# Patient Record
Sex: Male | Born: 1935 | Race: White | Hispanic: No | Marital: Married | Smoking: Former smoker
Health system: Southern US, Community
[De-identification: ages and names within clinical notes are randomized; demographics above are authoritative.]

## PROBLEM LIST (undated history)

## (undated) DIAGNOSIS — N189 Chronic kidney disease, unspecified: Secondary | ICD-10-CM

## (undated) DIAGNOSIS — Z0000 Encounter for general adult medical examination without abnormal findings: Secondary | ICD-10-CM

## (undated) DIAGNOSIS — D649 Anemia, unspecified: Secondary | ICD-10-CM

## (undated) DIAGNOSIS — I4821 Permanent atrial fibrillation: Secondary | ICD-10-CM

## (undated) DIAGNOSIS — D4620 Refractory anemia with excess of blasts, unspecified: Secondary | ICD-10-CM

## (undated) DIAGNOSIS — K219 Gastro-esophageal reflux disease without esophagitis: Secondary | ICD-10-CM

## (undated) DIAGNOSIS — M1990 Unspecified osteoarthritis, unspecified site: Secondary | ICD-10-CM

## (undated) DIAGNOSIS — I509 Heart failure, unspecified: Secondary | ICD-10-CM

## (undated) DIAGNOSIS — J189 Pneumonia, unspecified organism: Secondary | ICD-10-CM

## (undated) DIAGNOSIS — M719 Bursopathy, unspecified: Secondary | ICD-10-CM

## (undated) DIAGNOSIS — M109 Gout, unspecified: Secondary | ICD-10-CM

## (undated) DIAGNOSIS — H9319 Tinnitus, unspecified ear: Secondary | ICD-10-CM

## (undated) DIAGNOSIS — Z950 Presence of cardiac pacemaker: Secondary | ICD-10-CM

## (undated) DIAGNOSIS — Z8719 Personal history of other diseases of the digestive system: Secondary | ICD-10-CM

## (undated) DIAGNOSIS — I442 Atrioventricular block, complete: Secondary | ICD-10-CM

## (undated) DIAGNOSIS — N200 Calculus of kidney: Secondary | ICD-10-CM

## (undated) DIAGNOSIS — Z9289 Personal history of other medical treatment: Secondary | ICD-10-CM

## (undated) HISTORY — DX: Permanent atrial fibrillation: I48.21

## (undated) HISTORY — PX: KIDNEY STONE SURGERY: SHX686

## (undated) HISTORY — PX: CATARACT EXTRACTION W/ INTRAOCULAR LENS  IMPLANT, BILATERAL: SHX1307

## (undated) HISTORY — PX: BUNIONECTOMY: SHX129

## (undated) HISTORY — DX: Hemochromatosis due to repeated red blood cell transfusions: E83.111

## (undated) HISTORY — DX: Bursopathy, unspecified: M71.9

## (undated) HISTORY — DX: Encounter for general adult medical examination without abnormal findings: Z00.00

## (undated) HISTORY — PX: TOTAL KNEE ARTHROPLASTY: SHX125

## (undated) HISTORY — DX: Atrioventricular block, complete: I44.2

## (undated) HISTORY — PX: PILONIDAL CYST EXCISION: SHX744

## (undated) HISTORY — DX: Refractory anemia with excess of blasts, unspecified: D46.20

## (undated) HISTORY — PX: INGUINAL HERNIA REPAIR: SUR1180

## (undated) HISTORY — DX: Tinnitus, unspecified ear: H93.19

---

## 1983-01-20 DIAGNOSIS — N200 Calculus of kidney: Secondary | ICD-10-CM

## 1983-01-20 HISTORY — PX: LITHOTRIPSY: SUR834

## 1983-01-20 HISTORY — DX: Calculus of kidney: N20.0

## 1998-02-18 ENCOUNTER — Ambulatory Visit (HOSPITAL_COMMUNITY): Admission: RE | Admit: 1998-02-18 | Discharge: 1998-02-18 | Payer: Self-pay | Admitting: Hematology and Oncology

## 2001-07-25 ENCOUNTER — Ambulatory Visit (HOSPITAL_COMMUNITY): Admission: RE | Admit: 2001-07-25 | Discharge: 2001-07-25 | Payer: Self-pay | Admitting: Gastroenterology

## 2001-07-25 ENCOUNTER — Encounter (INDEPENDENT_AMBULATORY_CARE_PROVIDER_SITE_OTHER): Payer: Self-pay | Admitting: *Deleted

## 2002-03-31 ENCOUNTER — Ambulatory Visit: Admission: RE | Admit: 2002-03-31 | Discharge: 2002-03-31 | Payer: Self-pay | Admitting: Urology

## 2002-05-26 ENCOUNTER — Ambulatory Visit (HOSPITAL_COMMUNITY): Admission: RE | Admit: 2002-05-26 | Discharge: 2002-05-26 | Payer: Self-pay | Admitting: Hematology & Oncology

## 2002-05-26 ENCOUNTER — Encounter (INDEPENDENT_AMBULATORY_CARE_PROVIDER_SITE_OTHER): Payer: Self-pay | Admitting: Specialist

## 2002-09-23 ENCOUNTER — Encounter: Payer: Self-pay | Admitting: Emergency Medicine

## 2002-09-23 ENCOUNTER — Inpatient Hospital Stay (HOSPITAL_COMMUNITY): Admission: EM | Admit: 2002-09-23 | Discharge: 2002-09-29 | Payer: Self-pay | Admitting: Emergency Medicine

## 2002-09-26 ENCOUNTER — Encounter (INDEPENDENT_AMBULATORY_CARE_PROVIDER_SITE_OTHER): Payer: Self-pay | Admitting: Cardiology

## 2003-03-21 ENCOUNTER — Inpatient Hospital Stay (HOSPITAL_COMMUNITY): Admission: EM | Admit: 2003-03-21 | Discharge: 2003-03-23 | Payer: Self-pay | Admitting: Emergency Medicine

## 2004-01-15 ENCOUNTER — Ambulatory Visit: Payer: Self-pay | Admitting: Hematology & Oncology

## 2004-03-03 ENCOUNTER — Ambulatory Visit: Payer: Self-pay | Admitting: Hematology & Oncology

## 2004-04-18 ENCOUNTER — Ambulatory Visit: Payer: Self-pay | Admitting: Hematology & Oncology

## 2004-06-06 ENCOUNTER — Ambulatory Visit: Payer: Self-pay | Admitting: Hematology & Oncology

## 2004-07-07 ENCOUNTER — Encounter (HOSPITAL_COMMUNITY): Admission: RE | Admit: 2004-07-07 | Discharge: 2004-10-05 | Payer: Self-pay | Admitting: Hematology & Oncology

## 2004-07-28 ENCOUNTER — Ambulatory Visit: Payer: Self-pay | Admitting: Hematology & Oncology

## 2004-09-19 ENCOUNTER — Ambulatory Visit: Payer: Self-pay | Admitting: Hematology & Oncology

## 2004-10-27 ENCOUNTER — Encounter (HOSPITAL_COMMUNITY): Admission: RE | Admit: 2004-10-27 | Discharge: 2005-01-16 | Payer: Self-pay | Admitting: Hematology & Oncology

## 2004-11-07 ENCOUNTER — Ambulatory Visit: Payer: Self-pay | Admitting: Hematology & Oncology

## 2004-12-23 ENCOUNTER — Ambulatory Visit: Payer: Self-pay | Admitting: Hematology & Oncology

## 2005-02-04 ENCOUNTER — Ambulatory Visit (HOSPITAL_COMMUNITY): Admission: RE | Admit: 2005-02-04 | Discharge: 2005-02-04 | Payer: Self-pay | Admitting: Hematology & Oncology

## 2005-02-20 ENCOUNTER — Ambulatory Visit: Payer: Self-pay | Admitting: Hematology & Oncology

## 2005-03-23 ENCOUNTER — Ambulatory Visit (HOSPITAL_COMMUNITY): Admission: RE | Admit: 2005-03-23 | Discharge: 2005-03-23 | Payer: Self-pay | Admitting: Surgery

## 2005-04-10 ENCOUNTER — Ambulatory Visit: Payer: Self-pay | Admitting: Hematology & Oncology

## 2005-04-27 LAB — CBC & DIFF AND RETIC
BASO%: 0.5 % (ref 0.0–2.0)
Eosinophils Absolute: 0.1 10*3/uL (ref 0.0–0.5)
LYMPH%: 42.7 % (ref 14.0–48.0)
MCHC: 34.8 g/dL (ref 32.0–35.9)
MCV: 103.6 fL — ABNORMAL HIGH (ref 81.6–98.0)
MONO#: 0.6 10*3/uL (ref 0.1–0.9)
MONO%: 12.1 % (ref 0.0–13.0)
NEUT#: 1.9 10*3/uL (ref 1.5–6.5)
Platelets: 147 10*3/uL (ref 145–400)
RBC: 2.94 10*6/uL — ABNORMAL LOW (ref 4.20–5.71)
RDW: 16.6 % — ABNORMAL HIGH (ref 11.2–14.6)
RETIC #: 40.3 10*3/uL (ref 31.8–103.9)
Retic %: 1.4 % (ref 0.7–2.3)
WBC: 4.6 10*3/uL (ref 4.0–10.0)

## 2005-04-27 LAB — PROTIME-INR: INR: 1.6 — ABNORMAL LOW (ref 2.00–3.50)

## 2005-05-11 LAB — CBC WITH DIFFERENTIAL/PLATELET
Basophils Absolute: 0.1 10*3/uL (ref 0.0–0.1)
EOS%: 2.6 % (ref 0.0–7.0)
Eosinophils Absolute: 0.1 10*3/uL (ref 0.0–0.5)
HCT: 30.1 % — ABNORMAL LOW (ref 38.7–49.9)
HGB: 10.9 g/dL — ABNORMAL LOW (ref 13.0–17.1)
MCH: 35.8 pg — ABNORMAL HIGH (ref 28.0–33.4)
MCV: 99.2 fL — ABNORMAL HIGH (ref 81.6–98.0)
NEUT#: 1.5 10*3/uL (ref 1.5–6.5)
NEUT%: 35.8 % — ABNORMAL LOW (ref 40.0–75.0)
lymph#: 2 10*3/uL (ref 0.9–3.3)

## 2005-05-25 ENCOUNTER — Ambulatory Visit: Payer: Self-pay | Admitting: Hematology & Oncology

## 2005-05-25 LAB — CBC WITH DIFFERENTIAL/PLATELET
Basophils Absolute: 0.1 10*3/uL (ref 0.0–0.1)
EOS%: 1.9 % (ref 0.0–7.0)
Eosinophils Absolute: 0.1 10*3/uL (ref 0.0–0.5)
LYMPH%: 37.3 % (ref 14.0–48.0)
MCH: 36.3 pg — ABNORMAL HIGH (ref 28.0–33.4)
MCV: 100 fL — ABNORMAL HIGH (ref 81.6–98.0)
MONO%: 11.3 % (ref 0.0–13.0)
Platelets: 139 10*3/uL — ABNORMAL LOW (ref 145–400)
RBC: 3.03 10*6/uL — ABNORMAL LOW (ref 4.20–5.71)
RDW: 13.4 % (ref 11.2–14.6)

## 2005-05-25 LAB — CHCC SMEAR

## 2005-05-25 LAB — PROTIME-INR
INR: 1.9 — ABNORMAL LOW (ref 2.00–3.50)
Protime: 17.1 Seconds — ABNORMAL HIGH (ref 10.6–13.4)

## 2005-06-08 LAB — CBC WITH DIFFERENTIAL/PLATELET
Basophils Absolute: 0 10*3/uL (ref 0.0–0.1)
Eosinophils Absolute: 0.1 10*3/uL (ref 0.0–0.5)
HCT: 28.4 % — ABNORMAL LOW (ref 38.7–49.9)
LYMPH%: 44.9 % (ref 14.0–48.0)
MCV: 103.5 fL — ABNORMAL HIGH (ref 81.6–98.0)
MONO#: 0.5 10*3/uL (ref 0.1–0.9)
MONO%: 13.8 % — ABNORMAL HIGH (ref 0.0–13.0)
NEUT#: 1.4 10*3/uL — ABNORMAL LOW (ref 1.5–6.5)
NEUT%: 38.2 % — ABNORMAL LOW (ref 40.0–75.0)
Platelets: 163 10*3/uL (ref 145–400)
RBC: 2.75 10*6/uL — ABNORMAL LOW (ref 4.20–5.71)
WBC: 3.6 10*3/uL — ABNORMAL LOW (ref 4.0–10.0)

## 2005-06-08 LAB — COMPREHENSIVE METABOLIC PANEL
Albumin: 3.9 g/dL (ref 3.5–5.2)
BUN: 17 mg/dL (ref 6–23)
Calcium: 8.9 mg/dL (ref 8.4–10.5)
Chloride: 105 mEq/L (ref 96–112)
Glucose, Bld: 149 mg/dL — ABNORMAL HIGH (ref 70–99)
Potassium: 4 mEq/L (ref 3.5–5.3)
Sodium: 139 mEq/L (ref 135–145)
Total Protein: 6.4 g/dL (ref 6.0–8.3)

## 2005-06-22 LAB — CBC WITH DIFFERENTIAL/PLATELET
Basophils Absolute: 0 10*3/uL (ref 0.0–0.1)
Eosinophils Absolute: 0.1 10*3/uL (ref 0.0–0.5)
HCT: 29.5 % — ABNORMAL LOW (ref 38.7–49.9)
HGB: 10.2 g/dL — ABNORMAL LOW (ref 13.0–17.1)
LYMPH%: 46.8 % (ref 14.0–48.0)
MCV: 102 fL — ABNORMAL HIGH (ref 81.6–98.0)
MONO#: 0.6 10*3/uL (ref 0.1–0.9)
MONO%: 14.9 % — ABNORMAL HIGH (ref 0.0–13.0)
NEUT#: 1.4 10*3/uL — ABNORMAL LOW (ref 1.5–6.5)
NEUT%: 35.1 % — ABNORMAL LOW (ref 40.0–75.0)
Platelets: 193 10*3/uL (ref 145–400)
WBC: 4 10*3/uL (ref 4.0–10.0)

## 2005-06-22 LAB — PROTIME-INR

## 2005-07-09 ENCOUNTER — Ambulatory Visit: Payer: Self-pay | Admitting: Hematology & Oncology

## 2005-07-13 LAB — CBC WITH DIFFERENTIAL/PLATELET
BASO%: 1.6 % (ref 0.0–2.0)
Eosinophils Absolute: 0.1 10*3/uL (ref 0.0–0.5)
HCT: 27.7 % — ABNORMAL LOW (ref 38.7–49.9)
LYMPH%: 46.3 % (ref 14.0–48.0)
MCHC: 35.1 g/dL (ref 32.0–35.9)
MONO#: 0.6 10*3/uL (ref 0.1–0.9)
NEUT#: 1.6 10*3/uL (ref 1.5–6.5)
NEUT%: 37.4 % — ABNORMAL LOW (ref 40.0–75.0)
Platelets: 154 10*3/uL (ref 145–400)
WBC: 4.4 10*3/uL (ref 4.0–10.0)
lymph#: 2 10*3/uL (ref 0.9–3.3)

## 2005-07-13 LAB — PROTIME-INR: INR: 1.5 — ABNORMAL LOW (ref 2.00–3.50)

## 2005-07-13 LAB — CHCC SMEAR

## 2005-08-03 LAB — PROTIME-INR
INR: 2.6 (ref 2.00–3.50)
Protime: 19.5 Seconds — ABNORMAL HIGH (ref 10.6–13.4)

## 2005-08-03 LAB — CBC WITH DIFFERENTIAL/PLATELET
Basophils Absolute: 0 10*3/uL (ref 0.0–0.1)
Eosinophils Absolute: 0.1 10*3/uL (ref 0.0–0.5)
HGB: 9.6 g/dL — ABNORMAL LOW (ref 13.0–17.1)
LYMPH%: 46.9 % (ref 14.0–48.0)
MCV: 102 fL — ABNORMAL HIGH (ref 81.6–98.0)
MONO#: 0.6 10*3/uL (ref 0.1–0.9)
NEUT#: 1.3 10*3/uL — ABNORMAL LOW (ref 1.5–6.5)
Platelets: 150 10*3/uL (ref 145–400)
RBC: 2.6 10*6/uL — ABNORMAL LOW (ref 4.20–5.71)
WBC: 3.7 10*3/uL — ABNORMAL LOW (ref 4.0–10.0)

## 2005-08-03 LAB — CHCC SMEAR

## 2005-08-24 ENCOUNTER — Ambulatory Visit: Payer: Self-pay | Admitting: Hematology & Oncology

## 2005-08-24 LAB — CBC WITH DIFFERENTIAL/PLATELET
Eosinophils Absolute: 0.1 10*3/uL (ref 0.0–0.5)
LYMPH%: 43.1 % (ref 14.0–48.0)
MCHC: 35.2 g/dL (ref 32.0–35.9)
MCV: 105 fL — ABNORMAL HIGH (ref 81.6–98.0)
MONO%: 17.7 % — ABNORMAL HIGH (ref 0.0–13.0)
Platelets: 198 10*3/uL (ref 145–400)
RBC: 2.55 10*6/uL — ABNORMAL LOW (ref 4.20–5.71)

## 2005-08-24 LAB — CHCC SMEAR

## 2005-09-14 LAB — CBC WITH DIFFERENTIAL/PLATELET
BASO%: 1 % (ref 0.0–2.0)
EOS%: 2.8 % (ref 0.0–7.0)
HCT: 26.7 % — ABNORMAL LOW (ref 38.7–49.9)
LYMPH%: 46.9 % (ref 14.0–48.0)
MCH: 37.5 pg — ABNORMAL HIGH (ref 28.0–33.4)
MCHC: 35.3 g/dL (ref 32.0–35.9)
NEUT%: 33.3 % — ABNORMAL LOW (ref 40.0–75.0)
Platelets: 173 10*3/uL (ref 145–400)

## 2005-09-14 LAB — PROTIME-INR

## 2005-10-05 LAB — CBC WITH DIFFERENTIAL/PLATELET
Basophils Absolute: 0 10*3/uL (ref 0.0–0.1)
Eosinophils Absolute: 0.1 10*3/uL (ref 0.0–0.5)
HGB: 9.5 g/dL — ABNORMAL LOW (ref 13.0–17.1)
MCV: 100 fL — ABNORMAL HIGH (ref 81.6–98.0)
MONO#: 0.7 10*3/uL (ref 0.1–0.9)
MONO%: 16.6 % — ABNORMAL HIGH (ref 0.0–13.0)
NEUT#: 1.4 10*3/uL — ABNORMAL LOW (ref 1.5–6.5)
Platelets: 179 10*3/uL (ref 145–400)
RDW: 11.8 % (ref 11.2–14.6)

## 2005-10-05 LAB — PROTIME-INR: INR: 2.4 (ref 2.00–3.50)

## 2005-10-22 ENCOUNTER — Ambulatory Visit: Payer: Self-pay | Admitting: Hematology & Oncology

## 2005-11-16 LAB — CBC & DIFF AND RETIC
Basophils Absolute: 0 10*3/uL (ref 0.0–0.1)
EOS%: 2.6 % (ref 0.0–7.0)
HCT: 25.3 % — ABNORMAL LOW (ref 38.7–49.9)
HGB: 8.9 g/dL — ABNORMAL LOW (ref 13.0–17.1)
IRF: 0.29 (ref 0.070–0.380)
MCH: 38.3 pg — ABNORMAL HIGH (ref 28.0–33.4)
MONO#: 0.5 10*3/uL (ref 0.1–0.9)
NEUT#: 1.3 10*3/uL — ABNORMAL LOW (ref 1.5–6.5)
RDW: 14.3 % (ref 11.2–14.6)
RETIC #: 36.4 10*3/uL (ref 31.8–103.9)
WBC: 3.3 10*3/uL — ABNORMAL LOW (ref 4.0–10.0)
lymph#: 1.4 10*3/uL (ref 0.9–3.3)

## 2005-11-16 LAB — PROTIME-INR: INR: 2.5 (ref 2.00–3.50)

## 2005-12-03 ENCOUNTER — Ambulatory Visit: Payer: Self-pay | Admitting: Hematology & Oncology

## 2005-12-07 LAB — CBC WITH DIFFERENTIAL/PLATELET
BASO%: 1.4 % (ref 0.0–2.0)
Basophils Absolute: 0.1 10*3/uL (ref 0.0–0.1)
EOS%: 2.6 % (ref 0.0–7.0)
Eosinophils Absolute: 0.1 10*3/uL (ref 0.0–0.5)
HCT: 24.9 % — ABNORMAL LOW (ref 38.7–49.9)
HGB: 8.9 g/dL — ABNORMAL LOW (ref 13.0–17.1)
LYMPH%: 49 % — ABNORMAL HIGH (ref 14.0–48.0)
MCH: 37.8 pg — ABNORMAL HIGH (ref 28.0–33.4)
MCHC: 35.9 g/dL (ref 32.0–35.9)
MCV: 105 fL — ABNORMAL HIGH (ref 81.6–98.0)
MONO#: 0.6 10*3/uL (ref 0.1–0.9)
MONO%: 15.5 % — ABNORMAL HIGH (ref 0.0–13.0)
NEUT#: 1.2 10*3/uL — ABNORMAL LOW (ref 1.5–6.5)
NEUT%: 31.4 % — ABNORMAL LOW (ref 40.0–75.0)
Platelets: 192 10*3/uL (ref 145–400)
RBC: 2.36 10*6/uL — ABNORMAL LOW (ref 4.20–5.71)
RDW: 11.8 % (ref 11.2–14.6)
WBC: 3.9 10*3/uL — ABNORMAL LOW (ref 4.0–10.0)
lymph#: 1.9 10*3/uL (ref 0.9–3.3)

## 2005-12-07 LAB — PROTIME-INR
INR: 2.4 (ref 2.00–3.50)
Protime: 28.8 Seconds — ABNORMAL HIGH (ref 10.6–13.4)

## 2005-12-28 LAB — PROTIME-INR: Protime: 28.8 Seconds — ABNORMAL HIGH (ref 10.6–13.4)

## 2005-12-28 LAB — CBC WITH DIFFERENTIAL/PLATELET
EOS%: 2.9 % (ref 0.0–7.0)
MCH: 36.7 pg — ABNORMAL HIGH (ref 28.0–33.4)
MCV: 103 fL — ABNORMAL HIGH (ref 81.6–98.0)
MONO%: 15.8 % — ABNORMAL HIGH (ref 0.0–13.0)
NEUT#: 1.1 10*3/uL — ABNORMAL LOW (ref 1.5–6.5)
RBC: 2.49 10*6/uL — ABNORMAL LOW (ref 4.20–5.71)
RDW: 12 % (ref 11.2–14.6)

## 2005-12-28 LAB — CHCC SMEAR

## 2006-01-13 ENCOUNTER — Ambulatory Visit: Payer: Self-pay | Admitting: Hematology & Oncology

## 2006-01-18 LAB — PROTIME-INR
INR: 4 — ABNORMAL HIGH (ref 2.00–3.50)
Protime: 48 Seconds — ABNORMAL HIGH (ref 10.6–13.4)

## 2006-01-18 LAB — CHCC SMEAR

## 2006-01-18 LAB — CBC & DIFF AND RETIC
Eosinophils Absolute: 0.1 10*3/uL (ref 0.0–0.5)
HCT: 26.3 % — ABNORMAL LOW (ref 38.7–49.9)
LYMPH%: 38.9 % (ref 14.0–48.0)
MCHC: 34.6 g/dL (ref 32.0–35.9)
MCV: 110 fL — ABNORMAL HIGH (ref 81.6–98.0)
MONO#: 0.8 10*3/uL (ref 0.1–0.9)
MONO%: 17.1 % — ABNORMAL HIGH (ref 0.0–13.0)
NEUT#: 1.8 10*3/uL (ref 1.5–6.5)
NEUT%: 40.8 % (ref 40.0–75.0)
Platelets: 214 10*3/uL (ref 145–400)
RBC: 2.39 10*6/uL — ABNORMAL LOW (ref 4.20–5.71)
Retic %: 2 % (ref 0.7–2.3)
WBC: 4.4 10*3/uL (ref 4.0–10.0)

## 2006-01-18 LAB — FERRITIN: Ferritin: 968 ng/mL — ABNORMAL HIGH (ref 22–322)

## 2006-01-18 LAB — TESTOSTERONE: Testosterone: 572.8 ng/dL (ref 350–890)

## 2006-02-08 LAB — CBC WITH DIFFERENTIAL/PLATELET
Basophils Absolute: 0 10*3/uL (ref 0.0–0.1)
EOS%: 2.7 % (ref 0.0–7.0)
Eosinophils Absolute: 0.1 10*3/uL (ref 0.0–0.5)
HGB: 9 g/dL — ABNORMAL LOW (ref 13.0–17.1)
LYMPH%: 43 % (ref 14.0–48.0)
MCH: 37.1 pg — ABNORMAL HIGH (ref 28.0–33.4)
MCV: 109.8 fL — ABNORMAL HIGH (ref 81.6–98.0)
MONO%: 14.9 % — ABNORMAL HIGH (ref 0.0–13.0)
NEUT#: 1.5 10*3/uL (ref 1.5–6.5)
NEUT%: 38.8 % — ABNORMAL LOW (ref 40.0–75.0)
Platelets: 213 10*3/uL (ref 145–400)
RDW: 14.8 % — ABNORMAL HIGH (ref 11.2–14.6)

## 2006-02-08 LAB — CHCC SMEAR

## 2006-02-08 LAB — PROTIME-INR: INR: 2.9 (ref 2.00–3.50)

## 2006-02-24 ENCOUNTER — Ambulatory Visit: Payer: Self-pay | Admitting: Hematology & Oncology

## 2006-03-01 LAB — CBC & DIFF AND RETIC
BASO%: 0.8 % (ref 0.0–2.0)
EOS%: 2.8 % (ref 0.0–7.0)
IRF: 0.35 (ref 0.070–0.380)
MCH: 39.1 pg — ABNORMAL HIGH (ref 28.0–33.4)
MCHC: 36.4 g/dL — ABNORMAL HIGH (ref 32.0–35.9)
NEUT%: 37.1 % — ABNORMAL LOW (ref 40.0–75.0)
RBC: 2.14 10*6/uL — ABNORMAL LOW (ref 4.20–5.71)
RDW: 14.7 % — ABNORMAL HIGH (ref 11.2–14.6)
RETIC #: 22.9 10*3/uL — ABNORMAL LOW (ref 31.8–103.9)
lymph#: 1.6 10*3/uL (ref 0.9–3.3)

## 2006-03-01 LAB — PROTIME-INR
INR: 3.7 — ABNORMAL HIGH (ref 2.00–3.50)
Protime: 44.4 Seconds — ABNORMAL HIGH (ref 10.6–13.4)

## 2006-03-15 ENCOUNTER — Encounter (HOSPITAL_COMMUNITY): Admission: RE | Admit: 2006-03-15 | Discharge: 2006-03-15 | Payer: Self-pay | Admitting: Hematology & Oncology

## 2006-03-15 LAB — CBC WITH DIFFERENTIAL/PLATELET
BASO%: 0.7 % (ref 0.0–2.0)
EOS%: 2.7 % (ref 0.0–7.0)
HCT: 25.3 % — ABNORMAL LOW (ref 38.7–49.9)
LYMPH%: 43.5 % (ref 14.0–48.0)
MCH: 38.1 pg — ABNORMAL HIGH (ref 28.0–33.4)
MCHC: 35 g/dL (ref 32.0–35.9)
NEUT%: 37.6 % — ABNORMAL LOW (ref 40.0–75.0)
lymph#: 1.6 10*3/uL (ref 0.9–3.3)

## 2006-03-15 LAB — PROTIME-INR: INR: 2.4 (ref 2.00–3.50)

## 2006-03-22 LAB — CBC WITH DIFFERENTIAL/PLATELET
Eosinophils Absolute: 0.1 10*3/uL (ref 0.0–0.5)
HCT: 28.9 % — ABNORMAL LOW (ref 38.7–49.9)
LYMPH%: 48.4 % — ABNORMAL HIGH (ref 14.0–48.0)
MCHC: 36.2 g/dL — ABNORMAL HIGH (ref 32.0–35.9)
MCV: 102 fL — ABNORMAL HIGH (ref 81.6–98.0)
MONO%: 16.8 % — ABNORMAL HIGH (ref 0.0–13.0)
NEUT%: 30.5 % — ABNORMAL LOW (ref 40.0–75.0)
Platelets: 196 10*3/uL (ref 145–400)
RBC: 2.82 10*6/uL — ABNORMAL LOW (ref 4.20–5.71)

## 2006-03-29 LAB — CBC WITH DIFFERENTIAL/PLATELET
Basophils Absolute: 0 10*3/uL (ref 0.0–0.1)
Eosinophils Absolute: 0.1 10*3/uL (ref 0.0–0.5)
HCT: 24.4 % — ABNORMAL LOW (ref 38.7–49.9)
LYMPH%: 25 % (ref 14.0–48.0)
MCV: 103 fL — ABNORMAL HIGH (ref 81.6–98.0)
MONO#: 0.1 10*3/uL (ref 0.1–0.9)
MONO%: 2.3 % (ref 0.0–13.0)
NEUT#: 3.7 10*3/uL (ref 1.5–6.5)
NEUT%: 69.5 % (ref 40.0–75.0)
Platelets: 153 10*3/uL (ref 145–400)
RBC: 2.38 10*6/uL — ABNORMAL LOW (ref 4.20–5.71)
WBC: 5.4 10*3/uL (ref 4.0–10.0)

## 2006-03-29 LAB — PROTIME-INR: Protime: 49.2 Seconds — ABNORMAL HIGH (ref 10.6–13.4)

## 2006-04-05 LAB — CBC WITH DIFFERENTIAL/PLATELET
BASO%: 0.4 % (ref 0.0–2.0)
EOS%: 2.1 % (ref 0.0–7.0)
HCT: 22.3 % — ABNORMAL LOW (ref 38.7–49.9)
LYMPH%: 66 % — ABNORMAL HIGH (ref 14.0–48.0)
MCH: 36.7 pg — ABNORMAL HIGH (ref 28.0–33.4)
MCHC: 35.4 g/dL (ref 32.0–35.9)
MCV: 103.6 fL — ABNORMAL HIGH (ref 81.6–98.0)
MONO#: 0 10*3/uL — ABNORMAL LOW (ref 0.1–0.9)
MONO%: 1.7 % (ref 0.0–13.0)
NEUT%: 29.8 % — ABNORMAL LOW (ref 40.0–75.0)
Platelets: 109 10*3/uL — ABNORMAL LOW (ref 145–400)
RBC: 2.15 10*6/uL — ABNORMAL LOW (ref 4.20–5.71)
WBC: 1.9 10*3/uL — ABNORMAL LOW (ref 4.0–10.0)

## 2006-04-05 LAB — PROTIME-INR: INR: 1.3 — ABNORMAL LOW (ref 2.00–3.50)

## 2006-04-12 ENCOUNTER — Ambulatory Visit: Payer: Self-pay | Admitting: Hematology & Oncology

## 2006-04-12 LAB — COMPREHENSIVE METABOLIC PANEL
Albumin: 4.3 g/dL (ref 3.5–5.2)
Alkaline Phosphatase: 70 U/L (ref 39–117)
BUN: 21 mg/dL (ref 6–23)
CO2: 26 mEq/L (ref 19–32)
Glucose, Bld: 103 mg/dL — ABNORMAL HIGH (ref 70–99)
Potassium: 4 mEq/L (ref 3.5–5.3)
Sodium: 143 mEq/L (ref 135–145)
Total Protein: 6.8 g/dL (ref 6.0–8.3)

## 2006-04-12 LAB — CBC WITH DIFFERENTIAL/PLATELET
Basophils Absolute: 0 10*3/uL (ref 0.0–0.1)
EOS%: 0.7 % (ref 0.0–7.0)
LYMPH%: 84.6 % — ABNORMAL HIGH (ref 14.0–48.0)
MCH: 36.2 pg — ABNORMAL HIGH (ref 28.0–33.4)
MCV: 103 fL — ABNORMAL HIGH (ref 81.6–98.0)
MONO%: 2.9 % (ref 0.0–13.0)
Platelets: 237 10*3/uL (ref 145–400)
RBC: 2.51 10*6/uL — ABNORMAL LOW (ref 4.20–5.71)
RDW: 16.9 % — ABNORMAL HIGH (ref 11.2–14.6)

## 2006-04-12 LAB — PROTIME-INR
INR: 1.9 — ABNORMAL LOW (ref 2.00–3.50)
Protime: 22.8 Seconds — ABNORMAL HIGH (ref 10.6–13.4)

## 2006-04-19 LAB — CBC WITH DIFFERENTIAL/PLATELET
BASO%: 1.5 % (ref 0.0–2.0)
Basophils Absolute: 0 10*3/uL (ref 0.0–0.1)
Eosinophils Absolute: 0 10*3/uL (ref 0.0–0.5)
HCT: 28.7 % — ABNORMAL LOW (ref 38.7–49.9)
HGB: 10.1 g/dL — ABNORMAL LOW (ref 13.0–17.1)
MONO#: 0.3 10*3/uL (ref 0.1–0.9)
NEUT%: 29.3 % — ABNORMAL LOW (ref 40.0–75.0)
WBC: 2.4 10*3/uL — ABNORMAL LOW (ref 4.0–10.0)
lymph#: 1.3 10*3/uL (ref 0.9–3.3)

## 2006-04-26 LAB — CBC WITH DIFFERENTIAL/PLATELET
Basophils Absolute: 0 10*3/uL (ref 0.0–0.1)
EOS%: 1.6 % (ref 0.0–7.0)
HCT: 28.6 % — ABNORMAL LOW (ref 38.7–49.9)
HGB: 10 g/dL — ABNORMAL LOW (ref 13.0–17.1)
MCH: 36.1 pg — ABNORMAL HIGH (ref 28.0–33.4)
MCV: 103 fL — ABNORMAL HIGH (ref 81.6–98.0)
NEUT%: 53.8 % (ref 40.0–75.0)
lymph#: 1.3 10*3/uL (ref 0.9–3.3)

## 2006-05-03 LAB — PROTIME-INR

## 2006-05-17 LAB — CBC WITH DIFFERENTIAL/PLATELET
Basophils Absolute: 0.1 10*3/uL (ref 0.0–0.1)
EOS%: 1.2 % (ref 0.0–7.0)
Eosinophils Absolute: 0 10*3/uL (ref 0.0–0.5)
HCT: 31.2 % — ABNORMAL LOW (ref 38.7–49.9)
HGB: 11.2 g/dL — ABNORMAL LOW (ref 13.0–17.1)
MCH: 36.4 pg — ABNORMAL HIGH (ref 28.0–33.4)
MONO#: 0.3 10*3/uL (ref 0.1–0.9)
NEUT%: 32.3 % — ABNORMAL LOW (ref 40.0–75.0)
lymph#: 1.9 10*3/uL (ref 0.9–3.3)

## 2006-05-24 ENCOUNTER — Ambulatory Visit: Payer: Self-pay | Admitting: Hematology & Oncology

## 2006-05-24 LAB — CBC WITH DIFFERENTIAL/PLATELET
Basophils Absolute: 0.1 10*3/uL (ref 0.0–0.1)
EOS%: 2.1 % (ref 0.0–7.0)
HCT: 31 % — ABNORMAL LOW (ref 38.7–49.9)
HGB: 10.8 g/dL — ABNORMAL LOW (ref 13.0–17.1)
LYMPH%: 28.7 % (ref 14.0–48.0)
MCH: 35.5 pg — ABNORMAL HIGH (ref 28.0–33.4)
MCV: 102 fL — ABNORMAL HIGH (ref 81.6–98.0)
MONO%: 2.5 % (ref 0.0–13.0)
NEUT%: 65.4 % (ref 40.0–75.0)
Platelets: 308 10*3/uL (ref 145–400)

## 2006-05-24 LAB — PROTIME-INR: INR: 3.9 — ABNORMAL HIGH (ref 2.00–3.50)

## 2006-06-21 LAB — CBC & DIFF AND RETIC
Basophils Absolute: 0 10*3/uL (ref 0.0–0.1)
Eosinophils Absolute: 0.1 10*3/uL (ref 0.0–0.5)
HGB: 10.7 g/dL — ABNORMAL LOW (ref 13.0–17.1)
IRF: 0.31 (ref 0.070–0.380)
MCV: 101.1 fL — ABNORMAL HIGH (ref 81.6–98.0)
MONO#: 0.4 10*3/uL (ref 0.1–0.9)
MONO%: 12.4 % (ref 0.0–13.0)
NEUT#: 0.9 10*3/uL — ABNORMAL LOW (ref 1.5–6.5)
RBC: 2.98 10*6/uL — ABNORMAL LOW (ref 4.20–5.71)
RDW: 19 % — ABNORMAL HIGH (ref 11.2–14.6)
RETIC #: 27.7 10*3/uL — ABNORMAL LOW (ref 31.8–103.9)
Retic %: 0.9 % (ref 0.7–2.3)
WBC: 3.1 10*3/uL — ABNORMAL LOW (ref 4.0–10.0)

## 2006-06-21 LAB — PROTIME-INR
INR: 3 (ref 2.00–3.50)
Protime: 36 Seconds — ABNORMAL HIGH (ref 10.6–13.4)

## 2006-07-14 ENCOUNTER — Ambulatory Visit: Payer: Self-pay | Admitting: Hematology & Oncology

## 2006-07-19 LAB — CBC WITH DIFFERENTIAL/PLATELET
BASO%: 3.7 % — ABNORMAL HIGH (ref 0.0–2.0)
Basophils Absolute: 0.1 10*3/uL (ref 0.0–0.1)
EOS%: 1.9 % (ref 0.0–7.0)
HGB: 10.5 g/dL — ABNORMAL LOW (ref 13.0–17.1)
MCH: 34.5 pg — ABNORMAL HIGH (ref 28.0–33.4)
MCHC: 35.7 g/dL (ref 32.0–35.9)
MCV: 96.7 fL (ref 81.6–98.0)
MONO%: 10.1 % (ref 0.0–13.0)
RBC: 3.03 10*6/uL — ABNORMAL LOW (ref 4.20–5.71)
RDW: 15.8 % — ABNORMAL HIGH (ref 11.2–14.6)
lymph#: 1.6 10*3/uL (ref 0.9–3.3)

## 2006-07-19 LAB — MORPHOLOGY

## 2006-07-19 LAB — PROTIME-INR
INR: 2.9 (ref 2.00–3.50)
Protime: 34.8 Seconds — ABNORMAL HIGH (ref 10.6–13.4)

## 2006-07-26 LAB — CBC WITH DIFFERENTIAL/PLATELET
EOS%: 2.3 % (ref 0.0–7.0)
LYMPH%: 41.4 % (ref 14.0–48.0)
MCH: 35.2 pg — ABNORMAL HIGH (ref 28.0–33.4)
MCV: 98.2 fL — ABNORMAL HIGH (ref 81.6–98.0)
MONO%: 12.9 % (ref 0.0–13.0)
RBC: 3 10*6/uL — ABNORMAL LOW (ref 4.20–5.71)
RDW: 15.1 % — ABNORMAL HIGH (ref 11.2–14.6)

## 2006-07-26 LAB — PROTIME-INR
INR: 2.2 (ref 2.00–3.50)
Protime: 26.4 Seconds — ABNORMAL HIGH (ref 10.6–13.4)

## 2006-08-20 ENCOUNTER — Ambulatory Visit: Payer: Self-pay | Admitting: Hematology & Oncology

## 2006-08-23 LAB — CBC & DIFF AND RETIC
Basophils Absolute: 0 10*3/uL (ref 0.0–0.1)
EOS%: 1.2 % (ref 0.0–7.0)
Eosinophils Absolute: 0 10*3/uL (ref 0.0–0.5)
HCT: 30.3 % — ABNORMAL LOW (ref 38.7–49.9)
HGB: 10.5 g/dL — ABNORMAL LOW (ref 13.0–17.1)
IRF: 0.31 (ref 0.070–0.380)
MCH: 35.5 pg — ABNORMAL HIGH (ref 28.0–33.4)
MONO#: 0.1 10*3/uL (ref 0.1–0.9)
NEUT#: 1.1 10*3/uL — ABNORMAL LOW (ref 1.5–6.5)
NEUT%: 40.1 % (ref 40.0–75.0)
lymph#: 1.4 10*3/uL (ref 0.9–3.3)

## 2006-08-23 LAB — PROTIME-INR: INR: 2.3 (ref 2.00–3.50)

## 2006-08-23 LAB — FERRITIN: Ferritin: 1208 ng/mL — ABNORMAL HIGH (ref 22–322)

## 2006-09-13 LAB — CBC WITH DIFFERENTIAL/PLATELET
BASO%: 0.7 % (ref 0.0–2.0)
EOS%: 2.7 % (ref 0.0–7.0)
MCH: 34.9 pg — ABNORMAL HIGH (ref 28.0–33.4)
MCHC: 34.7 g/dL (ref 32.0–35.9)
MCV: 101 fL — ABNORMAL HIGH (ref 81.6–98.0)
MONO%: 2.9 % (ref 0.0–13.0)
RDW: 15.6 % — ABNORMAL HIGH (ref 11.2–14.6)
lymph#: 1.8 10*3/uL (ref 0.9–3.3)

## 2006-09-13 LAB — PROTIME-INR
INR: 2.1 (ref 2.00–3.50)
Protime: 25.2 Seconds — ABNORMAL HIGH (ref 10.6–13.4)

## 2006-10-07 ENCOUNTER — Ambulatory Visit: Payer: Self-pay | Admitting: Hematology & Oncology

## 2006-10-11 LAB — CBC WITH DIFFERENTIAL/PLATELET
BASO%: 0.8 % (ref 0.0–2.0)
Basophils Absolute: 0 10*3/uL (ref 0.0–0.1)
EOS%: 3.4 % (ref 0.0–7.0)
HCT: 29.3 % — ABNORMAL LOW (ref 38.7–49.9)
MCH: 34.4 pg — ABNORMAL HIGH (ref 28.0–33.4)
MCHC: 34.8 g/dL (ref 32.0–35.9)
MCV: 98.7 fL — ABNORMAL HIGH (ref 81.6–98.0)
MONO%: 14.2 % — ABNORMAL HIGH (ref 0.0–13.0)
NEUT%: 49.6 % (ref 40.0–75.0)
RDW: 15.1 % — ABNORMAL HIGH (ref 11.2–14.6)
lymph#: 1.4 10*3/uL (ref 0.9–3.3)

## 2006-10-11 LAB — CHCC SMEAR

## 2006-10-11 LAB — PROTIME-INR: INR: 2.8 (ref 2.00–3.50)

## 2006-10-28 ENCOUNTER — Ambulatory Visit (HOSPITAL_COMMUNITY): Admission: RE | Admit: 2006-10-28 | Discharge: 2006-10-28 | Payer: Self-pay | Admitting: Cardiology

## 2006-11-08 LAB — CBC WITH DIFFERENTIAL/PLATELET
BASO%: 0.4 % (ref 0.0–2.0)
Basophils Absolute: 0 10*3/uL (ref 0.0–0.1)
EOS%: 2 % (ref 0.0–7.0)
HCT: 30.8 % — ABNORMAL LOW (ref 38.7–49.9)
HGB: 10.5 g/dL — ABNORMAL LOW (ref 13.0–17.1)
LYMPH%: 42.4 % (ref 14.0–48.0)
MCH: 34.9 pg — ABNORMAL HIGH (ref 28.0–33.4)
MCHC: 34.2 g/dL (ref 32.0–35.9)
MCV: 102 fL — ABNORMAL HIGH (ref 81.6–98.0)
MONO%: 13.7 % — ABNORMAL HIGH (ref 0.0–13.0)
NEUT%: 41.4 % (ref 40.0–75.0)
lymph#: 1.7 10*3/uL (ref 0.9–3.3)

## 2006-11-08 LAB — PROTIME-INR

## 2006-12-01 ENCOUNTER — Ambulatory Visit: Payer: Self-pay | Admitting: Hematology & Oncology

## 2006-12-06 LAB — FERRITIN: Ferritin: 1463 ng/mL — ABNORMAL HIGH (ref 22–322)

## 2006-12-06 LAB — CBC WITH DIFFERENTIAL/PLATELET
BASO%: 1.3 % (ref 0.0–2.0)
Basophils Absolute: 0 10*3/uL (ref 0.0–0.1)
EOS%: 1.2 % (ref 0.0–7.0)
MCH: 36.1 pg — ABNORMAL HIGH (ref 28.0–33.4)
MCHC: 35 g/dL (ref 32.0–35.9)
MCV: 103.3 fL — ABNORMAL HIGH (ref 81.6–98.0)
MONO%: 16.4 % — ABNORMAL HIGH (ref 0.0–13.0)
RBC: 2.94 10*6/uL — ABNORMAL LOW (ref 4.20–5.71)
RDW: 19 % — ABNORMAL HIGH (ref 11.2–14.6)
lymph#: 1.2 10*3/uL (ref 0.9–3.3)

## 2006-12-06 LAB — PROTIME-INR: Protime: 27.6 Seconds — ABNORMAL HIGH (ref 10.6–13.4)

## 2006-12-20 HISTORY — PX: AV NODE ABLATION: SHX1209

## 2006-12-28 DIAGNOSIS — I442 Atrioventricular block, complete: Secondary | ICD-10-CM

## 2006-12-28 HISTORY — PX: INSERT / REPLACE / REMOVE PACEMAKER: SUR710

## 2006-12-28 HISTORY — DX: Atrioventricular block, complete: I44.2

## 2007-01-03 LAB — PROTIME-INR: Protime: 18 Seconds — ABNORMAL HIGH (ref 10.6–13.4)

## 2007-01-03 LAB — CBC WITH DIFFERENTIAL/PLATELET
BASO%: 0.9 % (ref 0.0–2.0)
EOS%: 2.5 % (ref 0.0–7.0)
HCT: 27.9 % — ABNORMAL LOW (ref 38.7–49.9)
LYMPH%: 45 % (ref 14.0–48.0)
MCH: 35.5 pg — ABNORMAL HIGH (ref 28.0–33.4)
MCHC: 35 g/dL (ref 32.0–35.9)
MONO#: 0.6 10*3/uL (ref 0.1–0.9)
MONO%: 13.3 % — ABNORMAL HIGH (ref 0.0–13.0)
NEUT%: 38.3 % — ABNORMAL LOW (ref 40.0–75.0)
Platelets: 118 10*3/uL — ABNORMAL LOW (ref 145–400)
RBC: 2.75 10*6/uL — ABNORMAL LOW (ref 4.20–5.71)
WBC: 4.2 10*3/uL (ref 4.0–10.0)

## 2007-01-27 ENCOUNTER — Ambulatory Visit: Payer: Self-pay | Admitting: Hematology & Oncology

## 2007-01-31 LAB — CBC & DIFF AND RETIC
Basophils Absolute: 0 10*3/uL (ref 0.0–0.1)
Eosinophils Absolute: 0.1 10*3/uL (ref 0.0–0.5)
HGB: 9.4 g/dL — ABNORMAL LOW (ref 13.0–17.1)
IRF: 0.26 (ref 0.070–0.380)
MCV: 106.7 fL — ABNORMAL HIGH (ref 81.6–98.0)
MONO#: 0.4 10*3/uL (ref 0.1–0.9)
MONO%: 13.2 % — ABNORMAL HIGH (ref 0.0–13.0)
NEUT#: 1.1 10*3/uL — ABNORMAL LOW (ref 1.5–6.5)
RBC: 2.51 10*6/uL — ABNORMAL LOW (ref 4.20–5.71)
RDW: 17.3 % — ABNORMAL HIGH (ref 11.2–14.6)
RETIC #: 18.3 10*3/uL — ABNORMAL LOW (ref 31.8–103.9)
Retic %: 0.7 % (ref 0.7–2.3)
WBC: 2.7 10*3/uL — ABNORMAL LOW (ref 4.0–10.0)
lymph#: 1.1 10*3/uL (ref 0.9–3.3)

## 2007-01-31 LAB — PROTIME-INR

## 2007-01-31 LAB — CHCC SMEAR

## 2007-02-07 LAB — CBC WITH DIFFERENTIAL/PLATELET
BASO%: 0.8 % (ref 0.0–2.0)
Eosinophils Absolute: 0.1 10*3/uL (ref 0.0–0.5)
LYMPH%: 44 % (ref 14.0–48.0)
MCHC: 36.3 g/dL — ABNORMAL HIGH (ref 32.0–35.9)
MCV: 103 fL — ABNORMAL HIGH (ref 81.6–98.0)
MONO#: 0.4 10*3/uL (ref 0.1–0.9)
MONO%: 13.8 % — ABNORMAL HIGH (ref 0.0–13.0)
NEUT#: 1.3 10*3/uL — ABNORMAL LOW (ref 1.5–6.5)
Platelets: 140 10*3/uL — ABNORMAL LOW (ref 145–400)
RBC: 2.63 10*6/uL — ABNORMAL LOW (ref 4.20–5.71)
RDW: 14.2 % (ref 11.2–14.6)
WBC: 3.2 10*3/uL — ABNORMAL LOW (ref 4.0–10.0)

## 2007-02-22 ENCOUNTER — Encounter (HOSPITAL_COMMUNITY): Admission: RE | Admit: 2007-02-22 | Discharge: 2007-05-23 | Payer: Self-pay | Admitting: Hematology & Oncology

## 2007-02-22 LAB — CBC WITH DIFFERENTIAL/PLATELET
Basophils Absolute: 0 10*3/uL (ref 0.0–0.1)
EOS%: 2.3 % (ref 0.0–7.0)
HCT: 21.6 % — ABNORMAL LOW (ref 38.7–49.9)
HGB: 7.6 g/dL — ABNORMAL LOW (ref 13.0–17.1)
MCH: 38.7 pg — ABNORMAL HIGH (ref 28.0–33.4)
MCV: 110.8 fL — ABNORMAL HIGH (ref 81.6–98.0)
NEUT%: 41 % (ref 40.0–75.0)
Platelets: 55 10*3/uL — ABNORMAL LOW (ref 145–400)
lymph#: 0.8 10*3/uL — ABNORMAL LOW (ref 0.9–3.3)

## 2007-02-22 LAB — HOLD TUBE, BLOOD BANK

## 2007-02-28 LAB — CBC WITH DIFFERENTIAL/PLATELET
BASO%: 2.2 % — ABNORMAL HIGH (ref 0.0–2.0)
EOS%: 1.5 % (ref 0.0–7.0)
Eosinophils Absolute: 0 10*3/uL (ref 0.0–0.5)
LYMPH%: 74.6 % — ABNORMAL HIGH (ref 14.0–48.0)
MCH: 34.4 pg — ABNORMAL HIGH (ref 28.0–33.4)
MCHC: 34.7 g/dL (ref 32.0–35.9)
MCV: 99.2 fL — ABNORMAL HIGH (ref 81.6–98.0)
MONO%: 3.1 % (ref 0.0–13.0)
NEUT#: 0.2 10*3/uL — CL (ref 1.5–6.5)
Platelets: 66 10*3/uL — ABNORMAL LOW (ref 145–400)
RBC: 2.68 10*6/uL — ABNORMAL LOW (ref 4.20–5.71)
RDW: 18.4 % — ABNORMAL HIGH (ref 11.2–14.6)

## 2007-03-07 LAB — CBC WITH DIFFERENTIAL/PLATELET
BASO%: 0 % (ref 0.0–2.0)
Eosinophils Absolute: 0 10*3/uL (ref 0.0–0.5)
LYMPH%: 61.2 % — ABNORMAL HIGH (ref 14.0–48.0)
MCHC: 32.6 g/dL (ref 32.0–35.9)
MONO#: 0.2 10*3/uL (ref 0.1–0.9)
NEUT#: 0.6 10*3/uL — ABNORMAL LOW (ref 1.5–6.5)
Platelets: 384 10*3/uL (ref 145–400)
RBC: 3.1 10*6/uL — ABNORMAL LOW (ref 4.20–5.71)
RDW: 24.3 % — ABNORMAL HIGH (ref 11.2–14.6)
WBC: 2.2 10*3/uL — ABNORMAL LOW (ref 4.0–10.0)

## 2007-03-07 LAB — TECHNOLOGIST REVIEW

## 2007-03-07 LAB — PROTIME-INR: Protime: 33.6 Seconds — ABNORMAL HIGH (ref 10.6–13.4)

## 2007-03-07 LAB — CHCC SMEAR

## 2007-03-09 ENCOUNTER — Ambulatory Visit: Payer: Self-pay | Admitting: Hematology & Oncology

## 2007-04-04 LAB — CBC & DIFF AND RETIC
BASO%: 1.4 % (ref 0.0–2.0)
Basophils Absolute: 0 10*3/uL (ref 0.0–0.1)
EOS%: 1.6 % (ref 0.0–7.0)
HCT: 29.9 % — ABNORMAL LOW (ref 38.7–49.9)
HGB: 10.6 g/dL — ABNORMAL LOW (ref 13.0–17.1)
LYMPH%: 53.2 % — ABNORMAL HIGH (ref 14.0–48.0)
MCH: 35.9 pg — ABNORMAL HIGH (ref 28.0–33.4)
MCHC: 35.5 g/dL (ref 32.0–35.9)
MCV: 101.3 fL — ABNORMAL HIGH (ref 81.6–98.0)
NEUT%: 37.6 % — ABNORMAL LOW (ref 40.0–75.0)
Platelets: 190 10*3/uL (ref 145–400)

## 2007-04-04 LAB — CHCC SMEAR

## 2007-04-27 ENCOUNTER — Ambulatory Visit: Payer: Self-pay | Admitting: Hematology & Oncology

## 2007-05-02 LAB — PROTIME-INR
INR: 2.7 (ref 2.00–3.50)
Protime: 32.4 Seconds — ABNORMAL HIGH (ref 10.6–13.4)

## 2007-05-02 LAB — CBC WITH DIFFERENTIAL/PLATELET
Basophils Absolute: 0 10*3/uL (ref 0.0–0.1)
EOS%: 1.1 % (ref 0.0–7.0)
Eosinophils Absolute: 0 10*3/uL (ref 0.0–0.5)
HGB: 10.1 g/dL — ABNORMAL LOW (ref 13.0–17.1)
LYMPH%: 56.2 % — ABNORMAL HIGH (ref 14.0–48.0)
MCH: 36 pg — ABNORMAL HIGH (ref 28.0–33.4)
MCV: 102.2 fL — ABNORMAL HIGH (ref 81.6–98.0)
MONO%: 6.2 % (ref 0.0–13.0)
NEUT#: 0.8 10*3/uL — ABNORMAL LOW (ref 1.5–6.5)
Platelets: 149 10*3/uL (ref 145–400)

## 2007-05-02 LAB — FERRITIN: Ferritin: 1393 ng/mL — ABNORMAL HIGH (ref 22–322)

## 2007-05-30 LAB — CBC WITH DIFFERENTIAL/PLATELET
BASO%: 2.1 % — ABNORMAL HIGH (ref 0.0–2.0)
EOS%: 1.8 % (ref 0.0–7.0)
HCT: 29.3 % — ABNORMAL LOW (ref 38.7–49.9)
LYMPH%: 53.4 % — ABNORMAL HIGH (ref 14.0–48.0)
MCH: 35 pg — ABNORMAL HIGH (ref 28.0–33.4)
MCHC: 33.7 g/dL (ref 32.0–35.9)
MONO#: 0.2 10*3/uL (ref 0.1–0.9)
NEUT%: 34.5 % — ABNORMAL LOW (ref 40.0–75.0)
RBC: 2.82 10*6/uL — ABNORMAL LOW (ref 4.20–5.71)
WBC: 2.4 10*3/uL — ABNORMAL LOW (ref 4.0–10.0)
lymph#: 1.3 10*3/uL (ref 0.9–3.3)

## 2007-05-30 LAB — PROTIME-INR: INR: 2.7 (ref 2.00–3.50)

## 2007-05-30 LAB — CHCC SMEAR

## 2007-06-12 ENCOUNTER — Observation Stay (HOSPITAL_COMMUNITY): Admission: EM | Admit: 2007-06-12 | Discharge: 2007-06-14 | Payer: Self-pay | Admitting: Emergency Medicine

## 2007-06-29 ENCOUNTER — Ambulatory Visit: Payer: Self-pay | Admitting: Hematology & Oncology

## 2007-07-04 LAB — CBC WITH DIFFERENTIAL/PLATELET
Basophils Absolute: 0 10*3/uL (ref 0.0–0.1)
Eosinophils Absolute: 0.1 10*3/uL (ref 0.0–0.5)
HGB: 10.7 g/dL — ABNORMAL LOW (ref 13.0–17.1)
LYMPH%: 40.4 % (ref 14.0–48.0)
MCV: 100.5 fL — ABNORMAL HIGH (ref 81.6–98.0)
MONO%: 10.5 % (ref 0.0–13.0)
NEUT#: 2.5 10*3/uL (ref 1.5–6.5)
Platelets: 125 10*3/uL — ABNORMAL LOW (ref 145–400)
RBC: 3.02 10*6/uL — ABNORMAL LOW (ref 4.20–5.71)

## 2007-07-05 LAB — ERYTHROPOIETIN: Erythropoietin: 41.2 m[IU]/mL — ABNORMAL HIGH (ref 2.6–34.0)

## 2007-07-29 ENCOUNTER — Ambulatory Visit: Payer: Self-pay | Admitting: Hematology & Oncology

## 2007-08-01 LAB — CBC WITH DIFFERENTIAL (CANCER CENTER ONLY)
Eosinophils Absolute: 0.1 10*3/uL (ref 0.0–0.5)
LYMPH%: 38.1 % (ref 14.0–48.0)
MCH: 35.4 pg — ABNORMAL HIGH (ref 28.0–33.4)
MCV: 103 fL — ABNORMAL HIGH (ref 82–98)
MONO%: 10.7 % (ref 0.0–13.0)
Platelets: 110 10*3/uL — ABNORMAL LOW (ref 145–400)
RBC: 2.63 10*6/uL — ABNORMAL LOW (ref 4.20–5.70)
RDW: 13.5 % (ref 10.5–14.6)

## 2007-08-01 LAB — FERRITIN: Ferritin: 1808 ng/mL — ABNORMAL HIGH (ref 22–322)

## 2007-08-22 LAB — CBC WITH DIFFERENTIAL (CANCER CENTER ONLY)
BASO#: 0 10*3/uL (ref 0.0–0.2)
Eosinophils Absolute: 0.1 10*3/uL (ref 0.0–0.5)
HCT: 27.5 % — ABNORMAL LOW (ref 38.7–49.9)
HGB: 9.6 g/dL — ABNORMAL LOW (ref 13.0–17.1)
LYMPH#: 1.2 10*3/uL (ref 0.9–3.3)
MCH: 36.5 pg — ABNORMAL HIGH (ref 28.0–33.4)
NEUT#: 1.2 10*3/uL — ABNORMAL LOW (ref 1.5–6.5)
RBC: 2.63 10*6/uL — ABNORMAL LOW (ref 4.20–5.70)

## 2007-09-12 LAB — CBC WITH DIFFERENTIAL (CANCER CENTER ONLY)
BASO%: 0.2 % (ref 0.0–2.0)
Eosinophils Absolute: 0.1 10*3/uL (ref 0.0–0.5)
HCT: 26.6 % — ABNORMAL LOW (ref 38.7–49.9)
LYMPH#: 1 10*3/uL (ref 0.9–3.3)
LYMPH%: 45.7 % (ref 14.0–48.0)
MCV: 107 fL — ABNORMAL HIGH (ref 82–98)
MONO#: 0.2 10*3/uL (ref 0.1–0.9)
Platelets: 109 10*3/uL — ABNORMAL LOW (ref 145–400)
RBC: 2.49 10*6/uL — ABNORMAL LOW (ref 4.20–5.70)
RDW: 15.9 % — ABNORMAL HIGH (ref 10.5–14.6)
WBC: 2.2 10*3/uL — ABNORMAL LOW (ref 4.0–10.0)

## 2007-09-12 LAB — RETICULOCYTES (CHCC)
ABS Retic: 16.4 10*3/uL — ABNORMAL LOW (ref 19.0–186.0)
RBC.: 2.74 MIL/uL — ABNORMAL LOW (ref 4.22–5.81)
Retic Ct Pct: 0.6 % (ref 0.4–3.1)

## 2007-09-12 LAB — PROTIME-INR (CHCC SATELLITE): Protime: 31.2 Seconds — ABNORMAL HIGH (ref 10.6–13.4)

## 2007-09-12 LAB — FERRITIN: Ferritin: 1521 ng/mL — ABNORMAL HIGH (ref 22–322)

## 2007-10-07 ENCOUNTER — Ambulatory Visit: Payer: Self-pay | Admitting: Hematology & Oncology

## 2007-10-10 LAB — CBC WITH DIFFERENTIAL (CANCER CENTER ONLY)
BASO#: 0 10*3/uL (ref 0.0–0.2)
Eosinophils Absolute: 0.1 10*3/uL (ref 0.0–0.5)
HCT: 25.2 % — ABNORMAL LOW (ref 38.7–49.9)
HGB: 8.7 g/dL — ABNORMAL LOW (ref 13.0–17.1)
LYMPH%: 39.7 % (ref 14.0–48.0)
MCH: 36.4 pg — ABNORMAL HIGH (ref 28.0–33.4)
MCV: 106 fL — ABNORMAL HIGH (ref 82–98)
MONO%: 12.1 % (ref 0.0–13.0)
NEUT%: 44.3 % (ref 40.0–80.0)
RBC: 2.39 10*6/uL — ABNORMAL LOW (ref 4.20–5.70)

## 2007-10-10 LAB — PROTIME-INR (CHCC SATELLITE): Protime: 19.2 Seconds — ABNORMAL HIGH (ref 10.6–13.4)

## 2007-10-10 LAB — FERRITIN: Ferritin: 1770 ng/mL — ABNORMAL HIGH (ref 22–322)

## 2007-10-10 LAB — CHCC SATELLITE - SMEAR

## 2007-10-31 LAB — CBC WITH DIFFERENTIAL (CANCER CENTER ONLY)
BASO#: 0 10*3/uL (ref 0.0–0.2)
BASO%: 0.3 % (ref 0.0–2.0)
EOS%: 2.6 % (ref 0.0–7.0)
HGB: 9.7 g/dL — ABNORMAL LOW (ref 13.0–17.1)
LYMPH#: 1.5 10*3/uL (ref 0.9–3.3)
MCHC: 35.1 g/dL (ref 32.0–35.9)
MONO#: 0.4 10*3/uL (ref 0.1–0.9)
NEUT#: 1.3 10*3/uL — ABNORMAL LOW (ref 1.5–6.5)
WBC: 3.3 10*3/uL — ABNORMAL LOW (ref 4.0–10.0)

## 2007-10-31 LAB — PROTIME-INR (CHCC SATELLITE): Protime: 39.6 Seconds — ABNORMAL HIGH (ref 10.6–13.4)

## 2007-11-14 ENCOUNTER — Encounter: Admission: RE | Admit: 2007-11-14 | Discharge: 2007-11-14 | Payer: Self-pay | Admitting: Cardiology

## 2007-11-21 LAB — CBC WITH DIFFERENTIAL (CANCER CENTER ONLY)
BASO#: 0 10*3/uL (ref 0.0–0.2)
Eosinophils Absolute: 0.1 10*3/uL (ref 0.0–0.5)
HCT: 27.1 % — ABNORMAL LOW (ref 38.7–49.9)
HGB: 9.4 g/dL — ABNORMAL LOW (ref 13.0–17.1)
LYMPH%: 50.3 % — ABNORMAL HIGH (ref 14.0–48.0)
MCH: 36.8 pg — ABNORMAL HIGH (ref 28.0–33.4)
MCV: 106 fL — ABNORMAL HIGH (ref 82–98)
MONO#: 0.4 10*3/uL (ref 0.1–0.9)
MONO%: 14.2 % — ABNORMAL HIGH (ref 0.0–13.0)
NEUT%: 32.2 % — ABNORMAL LOW (ref 40.0–80.0)
Platelets: 133 10*3/uL — ABNORMAL LOW (ref 145–400)
RBC: 2.56 10*6/uL — ABNORMAL LOW (ref 4.20–5.70)
WBC: 2.8 10*3/uL — ABNORMAL LOW (ref 4.0–10.0)

## 2007-11-21 LAB — PROTIME-INR (CHCC SATELLITE)

## 2007-12-09 ENCOUNTER — Ambulatory Visit: Payer: Self-pay | Admitting: Hematology & Oncology

## 2007-12-12 LAB — RETICULOCYTES (CHCC)
ABS Retic: 10.7 10*3/uL — ABNORMAL LOW (ref 19.0–186.0)
RBC.: 2.67 MIL/uL — ABNORMAL LOW (ref 4.22–5.81)
Retic Ct Pct: 0.4 % (ref 0.4–3.1)

## 2007-12-12 LAB — CBC WITH DIFFERENTIAL (CANCER CENTER ONLY)
BASO%: 0.4 % (ref 0.0–2.0)
EOS%: 2.5 % (ref 0.0–7.0)
HCT: 27.4 % — ABNORMAL LOW (ref 38.7–49.9)
LYMPH#: 1.8 10*3/uL (ref 0.9–3.3)
MCHC: 34.4 g/dL (ref 32.0–35.9)
MONO#: 0.4 10*3/uL (ref 0.1–0.9)
NEUT#: 1.3 10*3/uL — ABNORMAL LOW (ref 1.5–6.5)
NEUT%: 35.3 % — ABNORMAL LOW (ref 40.0–80.0)
Platelets: 116 10*3/uL — ABNORMAL LOW (ref 145–400)
RDW: 14.5 % (ref 10.5–14.6)
WBC: 3.6 10*3/uL — ABNORMAL LOW (ref 4.0–10.0)

## 2007-12-12 LAB — FERRITIN: Ferritin: 1637 ng/mL — ABNORMAL HIGH (ref 22–322)

## 2007-12-12 LAB — PROTIME-INR (CHCC SATELLITE)
INR: 3.2 (ref 2.0–3.5)
Protime: 38.4 Seconds — ABNORMAL HIGH (ref 10.6–13.4)

## 2008-01-02 LAB — CBC WITH DIFFERENTIAL (CANCER CENTER ONLY)
BASO#: 0 10*3/uL (ref 0.0–0.2)
EOS%: 2.8 % (ref 0.0–7.0)
HGB: 9.8 g/dL — ABNORMAL LOW (ref 13.0–17.1)
LYMPH#: 1.4 10*3/uL (ref 0.9–3.3)
MCH: 36.8 pg — ABNORMAL HIGH (ref 28.0–33.4)
MCHC: 34.2 g/dL (ref 32.0–35.9)
MONO%: 7.7 % (ref 0.0–13.0)
NEUT#: 2.4 10*3/uL (ref 1.5–6.5)
Platelets: 125 10*3/uL — ABNORMAL LOW (ref 145–400)
RBC: 2.65 10*6/uL — ABNORMAL LOW (ref 4.20–5.70)

## 2008-01-02 LAB — PROTIME-INR (CHCC SATELLITE): INR: 3.6 — ABNORMAL HIGH (ref 2.0–3.5)

## 2008-01-23 LAB — RETICULOCYTES (CHCC)
ABS Retic: 7.9 10*3/uL — ABNORMAL LOW (ref 19.0–186.0)
RBC.: 2.62 MIL/uL — ABNORMAL LOW (ref 4.22–5.81)
Retic Ct Pct: 0.3 % — ABNORMAL LOW (ref 0.4–3.1)

## 2008-01-23 LAB — CHCC SATELLITE - SMEAR

## 2008-01-23 LAB — CBC WITH DIFFERENTIAL (CANCER CENTER ONLY)
BASO%: 0.2 % (ref 0.0–2.0)
EOS%: 3.3 % (ref 0.0–7.0)
LYMPH#: 1.3 10*3/uL (ref 0.9–3.3)
MCH: 36.9 pg — ABNORMAL HIGH (ref 28.0–33.4)
MCHC: 34.6 g/dL (ref 32.0–35.9)
MONO%: 11.9 % (ref 0.0–13.0)
NEUT#: 1.5 10*3/uL (ref 1.5–6.5)
RDW: 13.4 % (ref 10.5–14.6)

## 2008-01-23 LAB — PROTIME-INR (CHCC SATELLITE): INR: 2.2 (ref 2.0–3.5)

## 2008-02-10 ENCOUNTER — Ambulatory Visit: Payer: Self-pay | Admitting: Hematology & Oncology

## 2008-02-13 LAB — CBC WITH DIFFERENTIAL (CANCER CENTER ONLY)
BASO#: 0 10*3/uL (ref 0.0–0.2)
Eosinophils Absolute: 0.1 10*3/uL (ref 0.0–0.5)
HGB: 9.6 g/dL — ABNORMAL LOW (ref 13.0–17.1)
LYMPH#: 1.4 10*3/uL (ref 0.9–3.3)
MCH: 37.1 pg — ABNORMAL HIGH (ref 28.0–33.4)
MONO#: 0.3 10*3/uL (ref 0.1–0.9)
NEUT#: 0.9 10*3/uL — ABNORMAL LOW (ref 1.5–6.5)
Platelets: 123 10*3/uL — ABNORMAL LOW (ref 145–400)
RBC: 2.58 10*6/uL — ABNORMAL LOW (ref 4.20–5.70)
WBC: 2.7 10*3/uL — ABNORMAL LOW (ref 4.0–10.0)

## 2008-03-05 LAB — CBC WITH DIFFERENTIAL (CANCER CENTER ONLY)
BASO#: 0 10*3/uL (ref 0.0–0.2)
BASO%: 0.8 % (ref 0.0–2.0)
Eosinophils Absolute: 0.1 10*3/uL (ref 0.0–0.5)
HCT: 27.3 % — ABNORMAL LOW (ref 38.7–49.9)
HGB: 9.2 g/dL — ABNORMAL LOW (ref 13.0–17.1)
LYMPH%: 38.3 % (ref 14.0–48.0)
MCV: 107 fL — ABNORMAL HIGH (ref 82–98)
MONO#: 0.3 10*3/uL (ref 0.1–0.9)
NEUT%: 46.6 % (ref 40.0–80.0)
RDW: 12.4 % (ref 10.5–14.6)
WBC: 2.9 10*3/uL — ABNORMAL LOW (ref 4.0–10.0)

## 2008-03-05 LAB — CHCC SATELLITE - SMEAR

## 2008-03-05 LAB — FERRITIN: Ferritin: 1593 ng/mL — ABNORMAL HIGH (ref 22–322)

## 2008-03-05 LAB — PROTIME-INR (CHCC SATELLITE)

## 2008-03-05 LAB — RETICULOCYTES (CHCC): ABS Retic: 10.2 10*3/uL — ABNORMAL LOW (ref 19.0–186.0)

## 2008-03-26 LAB — CBC WITH DIFFERENTIAL (CANCER CENTER ONLY)
BASO#: 0 10*3/uL (ref 0.0–0.2)
EOS%: 2.9 % (ref 0.0–7.0)
HCT: 31.5 % — ABNORMAL LOW (ref 38.7–49.9)
HGB: 10.5 g/dL — ABNORMAL LOW (ref 13.0–17.1)
LYMPH%: 42.9 % (ref 14.0–48.0)
MCH: 35.8 pg — ABNORMAL HIGH (ref 28.0–33.4)
MCHC: 33.3 g/dL (ref 32.0–35.9)
MCV: 108 fL — ABNORMAL HIGH (ref 82–98)
MONO%: 12.8 % (ref 0.0–13.0)
NEUT%: 41.1 % (ref 40.0–80.0)
RDW: 12.8 % (ref 10.5–14.6)

## 2008-03-26 LAB — PROTIME-INR (CHCC SATELLITE)
INR: 2.7 (ref 2.0–3.5)
Protime: 32.4 Seconds — ABNORMAL HIGH (ref 10.6–13.4)

## 2008-04-13 ENCOUNTER — Ambulatory Visit: Payer: Self-pay | Admitting: Hematology & Oncology

## 2008-04-16 LAB — CBC WITH DIFFERENTIAL (CANCER CENTER ONLY)
BASO#: 0 10*3/uL (ref 0.0–0.2)
BASO%: 0.7 % (ref 0.0–2.0)
EOS%: 3.3 % (ref 0.0–7.0)
HCT: 29.9 % — ABNORMAL LOW (ref 38.7–49.9)
LYMPH%: 37.4 % (ref 14.0–48.0)
MCH: 36.6 pg — ABNORMAL HIGH (ref 28.0–33.4)
MCHC: 33.8 g/dL (ref 32.0–35.9)
MCV: 108 fL — ABNORMAL HIGH (ref 82–98)
MONO%: 9.9 % (ref 0.0–13.0)
NEUT%: 48.7 % (ref 40.0–80.0)
RDW: 13 % (ref 10.5–14.6)

## 2008-04-17 ENCOUNTER — Inpatient Hospital Stay (HOSPITAL_COMMUNITY): Admission: EM | Admit: 2008-04-17 | Discharge: 2008-04-20 | Payer: Self-pay | Admitting: Emergency Medicine

## 2008-05-07 LAB — CBC WITH DIFFERENTIAL (CANCER CENTER ONLY)
BASO#: 0 10e3/uL (ref 0.0–0.2)
BASO%: 0.4 % (ref 0.0–2.0)
EOS%: 2.8 % (ref 0.0–7.0)
Eosinophils Absolute: 0.1 10e3/uL (ref 0.0–0.5)
HCT: 28.3 % — ABNORMAL LOW (ref 38.7–49.9)
HGB: 9.7 g/dL — ABNORMAL LOW (ref 13.0–17.1)
LYMPH#: 1.4 10e3/uL (ref 0.9–3.3)
LYMPH%: 38.7 % (ref 14.0–48.0)
MCH: 36.3 pg — ABNORMAL HIGH (ref 28.0–33.4)
MCHC: 34.3 g/dL (ref 32.0–35.9)
MCV: 106 fL — ABNORMAL HIGH (ref 82–98)
MONO#: 0.4 10e3/uL (ref 0.1–0.9)
MONO%: 10.7 % (ref 0.0–13.0)
NEUT#: 1.7 10e3/uL (ref 1.5–6.5)
NEUT%: 47.4 % (ref 40.0–80.0)
Platelets: 157 10e3/uL (ref 145–400)
RBC: 2.68 10e6/uL — ABNORMAL LOW (ref 4.20–5.70)
RDW: 13.3 % (ref 10.5–14.6)
WBC: 3.7 10e3/uL — ABNORMAL LOW (ref 4.0–10.0)

## 2008-05-07 LAB — FERRITIN: Ferritin: 1252 ng/mL — ABNORMAL HIGH (ref 22–322)

## 2008-05-07 LAB — CHCC SATELLITE - SMEAR

## 2008-05-07 LAB — PROTIME-INR (CHCC SATELLITE)

## 2008-05-28 ENCOUNTER — Ambulatory Visit: Payer: Self-pay | Admitting: Hematology & Oncology

## 2008-05-28 LAB — CBC WITH DIFFERENTIAL (CANCER CENTER ONLY)
BASO%: 0.6 % (ref 0.0–2.0)
Eosinophils Absolute: 0.1 10*3/uL (ref 0.0–0.5)
LYMPH#: 1.4 10*3/uL (ref 0.9–3.3)
LYMPH%: 46.5 % (ref 14.0–48.0)
MONO#: 0.3 10*3/uL (ref 0.1–0.9)
NEUT#: 1.2 10*3/uL — ABNORMAL LOW (ref 1.5–6.5)
Platelets: 120 10*3/uL — ABNORMAL LOW (ref 145–400)
RBC: 2.79 10*6/uL — ABNORMAL LOW (ref 4.20–5.70)
RDW: 12.7 % (ref 10.5–14.6)
WBC: 3 10*3/uL — ABNORMAL LOW (ref 4.0–10.0)

## 2008-06-19 LAB — FERRITIN: Ferritin: 1227 ng/mL — ABNORMAL HIGH (ref 22–322)

## 2008-06-19 LAB — CBC WITH DIFFERENTIAL (CANCER CENTER ONLY)
BASO%: 0.2 % (ref 0.0–2.0)
EOS%: 2.7 % (ref 0.0–7.0)
Eosinophils Absolute: 0.1 10*3/uL (ref 0.0–0.5)
LYMPH%: 44.7 % (ref 14.0–48.0)
MCH: 35.9 pg — ABNORMAL HIGH (ref 28.0–33.4)
MCHC: 33.2 g/dL (ref 32.0–35.9)
MCV: 108 fL — ABNORMAL HIGH (ref 82–98)
MONO%: 10.8 % (ref 0.0–13.0)
NEUT#: 1.4 10*3/uL — ABNORMAL LOW (ref 1.5–6.5)
Platelets: 135 10*3/uL — ABNORMAL LOW (ref 145–400)
RBC: 2.84 10*6/uL — ABNORMAL LOW (ref 4.20–5.70)
RDW: 13 % (ref 10.5–14.6)

## 2008-06-19 LAB — PROTIME-INR (CHCC SATELLITE): INR: 2.6 (ref 2.0–3.5)

## 2008-06-19 LAB — RETICULOCYTES (CHCC)
ABS Retic: 8.7 10*3/uL — ABNORMAL LOW (ref 19.0–186.0)
RBC.: 2.89 MIL/uL — ABNORMAL LOW (ref 4.22–5.81)
Retic Ct Pct: 0.3 % — ABNORMAL LOW (ref 0.4–3.1)

## 2008-07-09 LAB — CBC WITH DIFFERENTIAL (CANCER CENTER ONLY)
BASO%: 0.4 % (ref 0.0–2.0)
Eosinophils Absolute: 0.1 10*3/uL (ref 0.0–0.5)
HCT: 30.2 % — ABNORMAL LOW (ref 38.7–49.9)
LYMPH#: 1.6 10*3/uL (ref 0.9–3.3)
LYMPH%: 47.6 % (ref 14.0–48.0)
MCV: 108 fL — ABNORMAL HIGH (ref 82–98)
MONO#: 0.5 10*3/uL (ref 0.1–0.9)
NEUT%: 32.9 % — ABNORMAL LOW (ref 40.0–80.0)
RBC: 2.8 10*6/uL — ABNORMAL LOW (ref 4.20–5.70)
RDW: 12.6 % (ref 10.5–14.6)
WBC: 3.4 10*3/uL — ABNORMAL LOW (ref 4.0–10.0)

## 2008-07-27 ENCOUNTER — Ambulatory Visit: Payer: Self-pay | Admitting: Hematology & Oncology

## 2008-07-30 LAB — CBC WITH DIFFERENTIAL (CANCER CENTER ONLY)
HCT: 32.1 % — ABNORMAL LOW (ref 38.7–49.9)
HGB: 10.8 g/dL — ABNORMAL LOW (ref 13.0–17.1)
MCH: 36.1 pg — ABNORMAL HIGH (ref 28.0–33.4)
MCV: 107 fL — ABNORMAL HIGH (ref 82–98)
RBC: 2.99 10*6/uL — ABNORMAL LOW (ref 4.20–5.70)

## 2008-07-30 LAB — MANUAL DIFFERENTIAL (CHCC SATELLITE)
ALC: 1.3 10*3/uL (ref 0.9–3.3)
BASO: 1 % (ref 0–2)
Platelet Morphology: NORMAL
SEG: 42 % (ref 40–75)

## 2008-07-30 LAB — CHCC SATELLITE - SMEAR

## 2008-07-30 LAB — PROTIME-INR (CHCC SATELLITE)

## 2008-07-31 LAB — FERRITIN: Ferritin: 1925 ng/mL — ABNORMAL HIGH (ref 22–322)

## 2008-08-20 LAB — CBC WITH DIFFERENTIAL (CANCER CENTER ONLY)
BASO%: 0.4 % (ref 0.0–2.0)
EOS%: 2.9 % (ref 0.0–7.0)
HCT: 31.1 % — ABNORMAL LOW (ref 38.7–49.9)
LYMPH#: 1.6 10*3/uL (ref 0.9–3.3)
MCHC: 33.5 g/dL (ref 32.0–35.9)
NEUT#: 1.1 10*3/uL — ABNORMAL LOW (ref 1.5–6.5)
NEUT%: 34.7 % — ABNORMAL LOW (ref 40.0–80.0)
Platelets: 114 10*3/uL — ABNORMAL LOW (ref 145–400)
RDW: 12.6 % (ref 10.5–14.6)

## 2008-09-07 ENCOUNTER — Ambulatory Visit: Payer: Self-pay | Admitting: Hematology & Oncology

## 2008-09-10 LAB — CBC WITH DIFFERENTIAL (CANCER CENTER ONLY)
BASO#: 0 10e3/uL (ref 0.0–0.2)
BASO%: 0.3 % (ref 0.0–2.0)
EOS%: 3.2 % (ref 0.0–7.0)
Eosinophils Absolute: 0.1 10e3/uL (ref 0.0–0.5)
HCT: 29 % — ABNORMAL LOW (ref 38.7–49.9)
HGB: 10 g/dL — ABNORMAL LOW (ref 13.0–17.1)
LYMPH#: 1.5 10e3/uL (ref 0.9–3.3)
LYMPH%: 47.2 % (ref 14.0–48.0)
MCH: 35.9 pg — ABNORMAL HIGH (ref 28.0–33.4)
MCHC: 34.6 g/dL (ref 32.0–35.9)
MCV: 104 fL — ABNORMAL HIGH (ref 82–98)
MONO#: 0.4 10e3/uL (ref 0.1–0.9)
MONO%: 11.1 % (ref 0.0–13.0)
NEUT#: 1.2 10e3/uL — ABNORMAL LOW (ref 1.5–6.5)
NEUT%: 38.2 % — ABNORMAL LOW (ref 40.0–80.0)
Platelets: 148 10e3/uL (ref 145–400)
RBC: 2.79 10e6/uL — ABNORMAL LOW (ref 4.20–5.70)
RDW: 12.6 % (ref 10.5–14.6)
WBC: 3.2 10e3/uL — ABNORMAL LOW (ref 4.0–10.0)

## 2008-09-10 LAB — FERRITIN: Ferritin: 1298 ng/mL — ABNORMAL HIGH (ref 22–322)

## 2008-09-10 LAB — PROTIME-INR (CHCC SATELLITE): Protime: 34.8 Seconds — ABNORMAL HIGH (ref 10.6–13.4)

## 2008-10-01 LAB — CBC WITH DIFFERENTIAL (CANCER CENTER ONLY)
BASO#: 0 10*3/uL (ref 0.0–0.2)
BASO%: 0.3 % (ref 0.0–2.0)
EOS%: 3.1 % (ref 0.0–7.0)
HGB: 9.8 g/dL — ABNORMAL LOW (ref 13.0–17.1)
LYMPH#: 1.5 10*3/uL (ref 0.9–3.3)
MCHC: 33.9 g/dL (ref 32.0–35.9)
NEUT#: 1.3 10*3/uL — ABNORMAL LOW (ref 1.5–6.5)
WBC: 3.2 10*3/uL — ABNORMAL LOW (ref 4.0–10.0)

## 2008-10-19 ENCOUNTER — Ambulatory Visit: Payer: Self-pay | Admitting: Hematology & Oncology

## 2008-10-22 LAB — PROTIME-INR (CHCC SATELLITE): Protime: 32.4 Seconds — ABNORMAL HIGH (ref 10.6–13.4)

## 2008-10-22 LAB — CBC WITH DIFFERENTIAL (CANCER CENTER ONLY)
Eosinophils Absolute: 0.1 10*3/uL (ref 0.0–0.5)
HCT: 31.7 % — ABNORMAL LOW (ref 38.7–49.9)
LYMPH%: 37.5 % (ref 14.0–48.0)
MCH: 35.8 pg — ABNORMAL HIGH (ref 28.0–33.4)
MCV: 104 fL — ABNORMAL HIGH (ref 82–98)
MONO#: 0.3 10*3/uL (ref 0.1–0.9)
MONO%: 7.9 % (ref 0.0–13.0)
NEUT%: 51.9 % (ref 40.0–80.0)
RBC: 3.06 10*6/uL — ABNORMAL LOW (ref 4.20–5.70)
WBC: 3.8 10*3/uL — ABNORMAL LOW (ref 4.0–10.0)

## 2008-10-23 LAB — COMPREHENSIVE METABOLIC PANEL
ALT: 30 U/L (ref 0–53)
Alkaline Phosphatase: 74 U/L (ref 39–117)
CO2: 24 mEq/L (ref 19–32)
Sodium: 144 mEq/L (ref 135–145)
Total Bilirubin: 2.1 mg/dL — ABNORMAL HIGH (ref 0.3–1.2)
Total Protein: 6.8 g/dL (ref 6.0–8.3)

## 2008-11-12 LAB — CBC WITH DIFFERENTIAL (CANCER CENTER ONLY)
BASO%: 0.3 % (ref 0.0–2.0)
EOS%: 2.1 % (ref 0.0–7.0)
HCT: 29.7 % — ABNORMAL LOW (ref 38.7–49.9)
LYMPH%: 37.1 % (ref 14.0–48.0)
MCH: 35.3 pg — ABNORMAL HIGH (ref 28.0–33.4)
MCHC: 34.3 g/dL (ref 32.0–35.9)
MCV: 103 fL — ABNORMAL HIGH (ref 82–98)
MONO%: 9.4 % (ref 0.0–13.0)
NEUT%: 51.1 % (ref 40.0–80.0)
Platelets: 147 10*3/uL (ref 145–400)
RDW: 12.5 % (ref 10.5–14.6)

## 2008-11-30 ENCOUNTER — Ambulatory Visit: Payer: Self-pay | Admitting: Hematology & Oncology

## 2008-12-03 LAB — COMPREHENSIVE METABOLIC PANEL
ALT: 20 U/L (ref 0–53)
CO2: 27 mEq/L (ref 19–32)
Creatinine, Ser: 1.66 mg/dL — ABNORMAL HIGH (ref 0.40–1.50)
Total Bilirubin: 1.7 mg/dL — ABNORMAL HIGH (ref 0.3–1.2)

## 2008-12-03 LAB — CBC WITH DIFFERENTIAL (CANCER CENTER ONLY)
BASO#: 0 10*3/uL (ref 0.0–0.2)
BASO%: 0.3 % (ref 0.0–2.0)
EOS%: 1.8 % (ref 0.0–7.0)
Eosinophils Absolute: 0.1 10*3/uL (ref 0.0–0.5)
HCT: 32.3 % — ABNORMAL LOW (ref 38.7–49.9)
HGB: 11.2 g/dL — ABNORMAL LOW (ref 13.0–17.1)
LYMPH#: 1.8 10*3/uL (ref 0.9–3.3)
LYMPH%: 43.9 % (ref 14.0–48.0)
MCH: 35.4 pg — ABNORMAL HIGH (ref 28.0–33.4)
MCHC: 34.8 g/dL (ref 32.0–35.9)
MCV: 102 fL — ABNORMAL HIGH (ref 82–98)
MONO#: 0.3 10*3/uL (ref 0.1–0.9)
MONO%: 8.3 % (ref 0.0–13.0)
NEUT#: 1.9 10*3/uL (ref 1.5–6.5)
NEUT%: 45.7 % (ref 40.0–80.0)
Platelets: 137 10*3/uL — ABNORMAL LOW (ref 145–400)
RBC: 3.17 10*6/uL — ABNORMAL LOW (ref 4.20–5.70)
RDW: 12.3 % (ref 10.5–14.6)
WBC: 4.1 10*3/uL (ref 4.0–10.0)

## 2008-12-03 LAB — PROTIME-INR (CHCC SATELLITE): INR: 2.5 (ref 2.0–3.5)

## 2008-12-03 LAB — FERRITIN: Ferritin: 1454 ng/mL — ABNORMAL HIGH (ref 22–322)

## 2008-12-31 ENCOUNTER — Ambulatory Visit: Payer: Self-pay | Admitting: Hematology & Oncology

## 2008-12-31 LAB — CBC WITH DIFFERENTIAL (CANCER CENTER ONLY)
BASO%: 0.4 % (ref 0.0–2.0)
Eosinophils Absolute: 0.1 10*3/uL (ref 0.0–0.5)
LYMPH#: 1.7 10*3/uL (ref 0.9–3.3)
MONO#: 0.5 10*3/uL (ref 0.1–0.9)
Platelets: 133 10*3/uL — ABNORMAL LOW (ref 145–400)
RBC: 2.82 10*6/uL — ABNORMAL LOW (ref 4.20–5.70)
RDW: 12.4 % (ref 10.5–14.6)
WBC: 3.8 10*3/uL — ABNORMAL LOW (ref 4.0–10.0)

## 2008-12-31 LAB — PROTIME-INR (CHCC SATELLITE)
INR: 3.7 — ABNORMAL HIGH (ref 2.0–3.5)
Protime: 44.4 Seconds — ABNORMAL HIGH (ref 10.6–13.4)

## 2009-01-28 LAB — CBC WITH DIFFERENTIAL (CANCER CENTER ONLY)
BASO#: 0 10*3/uL (ref 0.0–0.2)
BASO%: 0.9 % (ref 0.0–2.0)
Eosinophils Absolute: 0.1 10*3/uL (ref 0.0–0.5)
LYMPH#: 2 10*3/uL (ref 0.9–3.3)
MCV: 102 fL — ABNORMAL HIGH (ref 82–98)
NEUT#: 2.1 10*3/uL (ref 1.5–6.5)
RDW: 12.3 % (ref 10.5–14.6)

## 2009-01-28 LAB — PROTIME-INR (CHCC SATELLITE): Protime: 26.4 Seconds — ABNORMAL HIGH (ref 10.6–13.4)

## 2009-02-22 ENCOUNTER — Ambulatory Visit: Payer: Self-pay | Admitting: Hematology & Oncology

## 2009-02-25 LAB — CBC WITH DIFFERENTIAL (CANCER CENTER ONLY)
BASO#: 0.1 10*3/uL (ref 0.0–0.2)
EOS%: 2.3 % (ref 0.0–7.0)
HCT: 30.9 % — ABNORMAL LOW (ref 38.7–49.9)
LYMPH#: 2.1 10*3/uL (ref 0.9–3.3)
MONO%: 14 % — ABNORMAL HIGH (ref 0.0–13.0)
Platelets: 136 10*3/uL — ABNORMAL LOW (ref 145–400)

## 2009-03-25 ENCOUNTER — Ambulatory Visit: Payer: Self-pay | Admitting: Hematology & Oncology

## 2009-03-25 LAB — CBC WITH DIFFERENTIAL (CANCER CENTER ONLY)
BASO%: 0.5 % (ref 0.0–2.0)
EOS%: 1.9 % (ref 0.0–7.0)
Eosinophils Absolute: 0.1 10*3/uL (ref 0.0–0.5)
HGB: 11.1 g/dL — ABNORMAL LOW (ref 13.0–17.1)
LYMPH#: 2.2 10*3/uL (ref 0.9–3.3)
LYMPH%: 44.5 % (ref 14.0–48.0)
MCH: 33.8 pg — ABNORMAL HIGH (ref 28.0–33.4)
MCHC: 33.2 g/dL (ref 32.0–35.9)
MONO#: 0.5 10*3/uL (ref 0.1–0.9)
MONO%: 9.3 % (ref 0.0–13.0)
NEUT#: 2.1 10*3/uL (ref 1.5–6.5)
Platelets: 139 10*3/uL — ABNORMAL LOW (ref 145–400)
RBC: 3.27 10*6/uL — ABNORMAL LOW (ref 4.20–5.70)

## 2009-03-25 LAB — FERRITIN: Ferritin: 1508 ng/mL — ABNORMAL HIGH (ref 22–322)

## 2009-05-03 ENCOUNTER — Ambulatory Visit: Payer: Self-pay | Admitting: Hematology & Oncology

## 2009-05-06 LAB — CBC WITH DIFFERENTIAL (CANCER CENTER ONLY)
HGB: 9.9 g/dL — ABNORMAL LOW (ref 13.0–17.1)
LYMPH%: 30.6 % (ref 14.0–48.0)
MCH: 34.2 pg — ABNORMAL HIGH (ref 28.0–33.4)
MCHC: 34.2 g/dL (ref 32.0–35.9)
MONO#: 0.6 10*3/uL (ref 0.1–0.9)
NEUT#: 2.8 10*3/uL (ref 1.5–6.5)
RDW: 12.1 % (ref 10.5–14.6)
WBC: 5 10*3/uL (ref 4.0–10.0)

## 2009-05-06 LAB — PROTIME-INR (CHCC SATELLITE): INR: 3.5 (ref 2.0–3.5)

## 2009-05-20 LAB — CBC WITH DIFFERENTIAL (CANCER CENTER ONLY)
BASO#: 0 10*3/uL (ref 0.0–0.2)
HCT: 33 % — ABNORMAL LOW (ref 38.7–49.9)
HGB: 11.1 g/dL — ABNORMAL LOW (ref 13.0–17.1)
LYMPH%: 42.6 % (ref 14.0–48.0)
MONO#: 0.5 10*3/uL (ref 0.1–0.9)
MONO%: 14.2 % — ABNORMAL HIGH (ref 0.0–13.0)
NEUT#: 1.5 10*3/uL (ref 1.5–6.5)
NEUT%: 39.8 % — ABNORMAL LOW (ref 40.0–80.0)

## 2009-05-20 LAB — PROTIME-INR (CHCC SATELLITE): Protime: 36 Seconds — ABNORMAL HIGH (ref 10.6–13.4)

## 2009-06-21 ENCOUNTER — Ambulatory Visit: Payer: Self-pay | Admitting: Hematology & Oncology

## 2009-06-24 LAB — CBC WITH DIFFERENTIAL (CANCER CENTER ONLY)
BASO#: 0 10*3/uL (ref 0.0–0.2)
BASO%: 0.4 % (ref 0.0–2.0)
Eosinophils Absolute: 0.1 10*3/uL (ref 0.0–0.5)
HCT: 33.6 % — ABNORMAL LOW (ref 38.7–49.9)
HGB: 11.2 g/dL — ABNORMAL LOW (ref 13.0–17.1)
MCV: 99 fL — ABNORMAL HIGH (ref 82–98)
MONO#: 0.5 10*3/uL (ref 0.1–0.9)
MONO%: 11 % (ref 0.0–13.0)
NEUT#: 1.8 10*3/uL (ref 1.5–6.5)
RBC: 3.39 10*6/uL — ABNORMAL LOW (ref 4.20–5.70)
RDW: 11.4 % (ref 10.5–14.6)

## 2009-06-24 LAB — CHCC SATELLITE - SMEAR

## 2009-06-24 LAB — RETICULOCYTES (CHCC): RBC.: 3.4 MIL/uL — ABNORMAL LOW (ref 4.22–5.81)

## 2009-08-23 ENCOUNTER — Ambulatory Visit: Payer: Self-pay | Admitting: Hematology & Oncology

## 2009-08-26 LAB — RETICULOCYTES (CHCC): RBC.: 3.37 MIL/uL — ABNORMAL LOW (ref 4.22–5.81)

## 2009-08-26 LAB — CBC WITH DIFFERENTIAL (CANCER CENTER ONLY)
BASO#: 0 10*3/uL (ref 0.0–0.2)
BASO%: 0.4 % (ref 0.0–2.0)
EOS%: 1.4 % (ref 0.0–7.0)
HGB: 10.8 g/dL — ABNORMAL LOW (ref 13.0–17.1)
MCH: 33.3 pg (ref 28.0–33.4)
NEUT%: 63.2 % (ref 40.0–80.0)
WBC: 6.8 10*3/uL (ref 4.0–10.0)

## 2009-08-26 LAB — FERRITIN: Ferritin: 1527 ng/mL — ABNORMAL HIGH (ref 22–322)

## 2009-10-02 ENCOUNTER — Ambulatory Visit: Payer: Self-pay | Admitting: Cardiology

## 2009-10-25 ENCOUNTER — Ambulatory Visit: Payer: Self-pay | Admitting: Hematology & Oncology

## 2009-10-28 LAB — CBC WITH DIFFERENTIAL (CANCER CENTER ONLY)
BASO#: 0 10*3/uL (ref 0.0–0.2)
BASO%: 0.4 % (ref 0.0–2.0)
EOS%: 2.1 % (ref 0.0–7.0)
Eosinophils Absolute: 0.1 10*3/uL (ref 0.0–0.5)
HCT: 32.1 % — ABNORMAL LOW (ref 38.7–49.9)
HGB: 10.8 g/dL — ABNORMAL LOW (ref 13.0–17.1)
LYMPH#: 1.7 10*3/uL (ref 0.9–3.3)
LYMPH%: 41.9 % (ref 14.0–48.0)
MCH: 33 pg (ref 28.0–33.4)
MCHC: 33.6 g/dL (ref 32.0–35.9)
MCV: 98 fL (ref 82–98)
MONO#: 0.6 10*3/uL (ref 0.1–0.9)
MONO%: 14.1 % — ABNORMAL HIGH (ref 0.0–13.0)
NEUT#: 1.7 10*3/uL (ref 1.5–6.5)
NEUT%: 41.5 % (ref 40.0–80.0)
Platelets: 140 10*3/uL — ABNORMAL LOW (ref 145–400)
RBC: 3.27 10*6/uL — ABNORMAL LOW (ref 4.20–5.70)
RDW: 11.6 % (ref 10.5–14.6)
WBC: 4 10*3/uL (ref 4.0–10.0)

## 2009-10-28 LAB — PROTIME-INR (CHCC SATELLITE)

## 2009-10-28 LAB — FERRITIN: Ferritin: 1512 ng/mL — ABNORMAL HIGH (ref 22–322)

## 2009-12-20 ENCOUNTER — Ambulatory Visit: Payer: Self-pay | Admitting: Hematology & Oncology

## 2009-12-23 LAB — CBC WITH DIFFERENTIAL (CANCER CENTER ONLY)
BASO#: 0 10*3/uL (ref 0.0–0.2)
BASO%: 0.6 % (ref 0.0–2.0)
EOS%: 1.3 % (ref 0.0–7.0)
Eosinophils Absolute: 0.1 10*3/uL (ref 0.0–0.5)
HCT: 37.3 % — ABNORMAL LOW (ref 38.7–49.9)
HGB: 12.4 g/dL — ABNORMAL LOW (ref 13.0–17.1)
LYMPH#: 1.7 10*3/uL (ref 0.9–3.3)
LYMPH%: 28.6 % (ref 14.0–48.0)
MCH: 32.6 pg (ref 28.0–33.4)
MCHC: 33.3 g/dL (ref 32.0–35.9)
MCV: 98 fL (ref 82–98)
MONO#: 1 10*3/uL — ABNORMAL HIGH (ref 0.1–0.9)
MONO%: 16 % — ABNORMAL HIGH (ref 0.0–13.0)
NEUT#: 3.3 10*3/uL (ref 1.5–6.5)
NEUT%: 53.5 % (ref 40.0–80.0)
Platelets: 192 10*3/uL (ref 145–400)
RBC: 3.81 10*6/uL — ABNORMAL LOW (ref 4.20–5.70)
RDW: 11.4 % (ref 10.5–14.6)
WBC: 6.1 10*3/uL (ref 4.0–10.0)

## 2009-12-23 LAB — PROTIME-INR (CHCC SATELLITE)
INR: 2 (ref 2.0–3.5)
Protime: 24 s — ABNORMAL HIGH (ref 10.6–13.4)

## 2009-12-23 LAB — CHCC SATELLITE - SMEAR

## 2009-12-23 LAB — RETICULOCYTES (CHCC)
ABS Retic: 30.3 10*3/uL (ref 19.0–186.0)
RBC.: 3.79 MIL/uL — ABNORMAL LOW (ref 4.22–5.81)
Retic Ct Pct: 0.8 % (ref 0.4–3.1)

## 2009-12-27 ENCOUNTER — Inpatient Hospital Stay (HOSPITAL_COMMUNITY)
Admission: RE | Admit: 2009-12-27 | Discharge: 2010-01-01 | Payer: Self-pay | Source: Home / Self Care | Attending: Orthopaedic Surgery | Admitting: Orthopaedic Surgery

## 2010-01-24 ENCOUNTER — Ambulatory Visit: Payer: Self-pay | Admitting: Hematology & Oncology

## 2010-01-27 LAB — CBC WITH DIFFERENTIAL (CANCER CENTER ONLY)
BASO#: 0 10*3/uL (ref 0.0–0.2)
BASO%: 0.4 % (ref 0.0–2.0)
EOS%: 1.7 % (ref 0.0–7.0)
Eosinophils Absolute: 0.1 10*3/uL (ref 0.0–0.5)
HCT: 34 % — ABNORMAL LOW (ref 38.7–49.9)
HGB: 11.3 g/dL — ABNORMAL LOW (ref 13.0–17.1)
LYMPH#: 1.4 10*3/uL (ref 0.9–3.3)
LYMPH%: 28.5 % (ref 14.0–48.0)
MCH: 31 pg (ref 28.0–33.4)
MCHC: 33.2 g/dL (ref 32.0–35.9)
MCV: 93 fL (ref 82–98)
MONO#: 0.6 10*3/uL (ref 0.1–0.9)
MONO%: 12 % (ref 0.0–13.0)
NEUT#: 2.9 10*3/uL (ref 1.5–6.5)
NEUT%: 57.4 % (ref 40.0–80.0)
Platelets: 176 10*3/uL (ref 145–400)
RBC: 3.64 10*6/uL — ABNORMAL LOW (ref 4.20–5.70)
RDW: 13.5 % (ref 10.5–14.6)
WBC: 5 10*3/uL (ref 4.0–10.0)

## 2010-01-27 LAB — PROTIME-INR (CHCC SATELLITE)
INR: 4.2 — ABNORMAL HIGH (ref 2.0–3.5)
Protime: 50.4 Seconds — ABNORMAL HIGH (ref 10.6–13.4)

## 2010-01-27 LAB — RETICULOCYTES (CHCC)
ABS Retic: 25.8 10*3/uL (ref 19.0–186.0)
RBC.: 3.69 MIL/uL — ABNORMAL LOW (ref 4.22–5.81)
Retic Ct Pct: 0.7 % (ref 0.4–3.1)

## 2010-01-27 LAB — CHCC SATELLITE - SMEAR

## 2010-01-27 LAB — FERRITIN: Ferritin: 1837 ng/mL — ABNORMAL HIGH (ref 22–322)

## 2010-01-29 ENCOUNTER — Ambulatory Visit: Payer: Self-pay | Admitting: Cardiology

## 2010-02-03 LAB — PROTIME-INR (CHCC SATELLITE)
INR: 2.4 (ref 2.0–3.5)
Protime: 28.8 Seconds — ABNORMAL HIGH (ref 10.6–13.4)

## 2010-02-17 LAB — PROTIME-INR (CHCC SATELLITE)
INR: 1.7 — ABNORMAL LOW (ref 2.0–3.5)
Protime: 20.4 Seconds — ABNORMAL HIGH (ref 10.6–13.4)

## 2010-03-03 ENCOUNTER — Encounter (HOSPITAL_BASED_OUTPATIENT_CLINIC_OR_DEPARTMENT_OTHER): Payer: Medicare Other | Admitting: Hematology & Oncology

## 2010-03-03 ENCOUNTER — Other Ambulatory Visit: Payer: Self-pay | Admitting: Hematology & Oncology

## 2010-03-03 DIAGNOSIS — Z7901 Long term (current) use of anticoagulants: Secondary | ICD-10-CM

## 2010-03-03 DIAGNOSIS — D4620 Refractory anemia with excess of blasts, unspecified: Secondary | ICD-10-CM

## 2010-03-03 DIAGNOSIS — I4891 Unspecified atrial fibrillation: Secondary | ICD-10-CM

## 2010-03-03 LAB — CBC WITH DIFFERENTIAL (CANCER CENTER ONLY)
EOS%: 1.9 % (ref 0.0–7.0)
MCH: 31.1 pg (ref 28.0–33.4)
MCV: 94 fL (ref 82–98)
MONO%: 16.7 % — ABNORMAL HIGH (ref 0.0–13.0)
NEUT%: 48.5 % (ref 40.0–80.0)
Platelets: 174 10*3/uL (ref 145–400)
RBC: 3.9 10*6/uL — ABNORMAL LOW (ref 4.20–5.70)

## 2010-03-03 LAB — FERRITIN: Ferritin: 1461 ng/mL — ABNORMAL HIGH (ref 22–322)

## 2010-03-03 LAB — PROTIME-INR (CHCC SATELLITE): INR: 1.8 — ABNORMAL LOW (ref 2.0–3.5)

## 2010-03-31 LAB — CBC
HCT: 22.9 % — ABNORMAL LOW (ref 39.0–52.0)
HCT: 23.1 % — ABNORMAL LOW (ref 39.0–52.0)
HCT: 25.2 % — ABNORMAL LOW (ref 39.0–52.0)
HCT: 25.7 % — ABNORMAL LOW (ref 39.0–52.0)
HCT: 33.6 % — ABNORMAL LOW (ref 39.0–52.0)
Hemoglobin: 7.3 g/dL — ABNORMAL LOW (ref 13.0–17.0)
Hemoglobin: 8.2 g/dL — ABNORMAL LOW (ref 13.0–17.0)
Hemoglobin: 8.3 g/dL — ABNORMAL LOW (ref 13.0–17.0)
MCH: 31.5 pg (ref 26.0–34.0)
MCH: 32.5 pg (ref 26.0–34.0)
MCHC: 31.6 g/dL (ref 30.0–36.0)
MCHC: 33.3 g/dL (ref 30.0–36.0)
MCHC: 33.7 g/dL (ref 30.0–36.0)
MCV: 95.8 fL (ref 78.0–100.0)
MCV: 98.2 fL (ref 78.0–100.0)
Platelets: 117 10*3/uL — ABNORMAL LOW (ref 150–400)
Platelets: 146 10*3/uL — ABNORMAL LOW (ref 150–400)
RBC: 2.39 MIL/uL — ABNORMAL LOW (ref 4.22–5.81)
RBC: 2.52 MIL/uL — ABNORMAL LOW (ref 4.22–5.81)
RBC: 2.89 MIL/uL — ABNORMAL LOW (ref 4.22–5.81)
RDW: 13.6 % (ref 11.5–15.5)
RDW: 16.6 % — ABNORMAL HIGH (ref 11.5–15.5)
WBC: 10.7 10*3/uL — ABNORMAL HIGH (ref 4.0–10.5)
WBC: 15.7 10*3/uL — ABNORMAL HIGH (ref 4.0–10.5)
WBC: 7.8 10*3/uL (ref 4.0–10.5)

## 2010-03-31 LAB — DIFFERENTIAL
Basophils Absolute: 0 10*3/uL (ref 0.0–0.1)
Eosinophils Absolute: 0.1 10*3/uL (ref 0.0–0.7)
Eosinophils Relative: 1 % (ref 0–5)
Lymphs Abs: 1.8 10*3/uL (ref 0.7–4.0)
Monocytes Absolute: 1.7 10*3/uL — ABNORMAL HIGH (ref 0.1–1.0)
Monocytes Relative: 27 % — ABNORMAL HIGH (ref 3–12)

## 2010-03-31 LAB — BASIC METABOLIC PANEL
BUN: 26 mg/dL — ABNORMAL HIGH (ref 6–23)
BUN: 31 mg/dL — ABNORMAL HIGH (ref 6–23)
CO2: 27 mEq/L (ref 19–32)
CO2: 28 mEq/L (ref 19–32)
CO2: 28 mEq/L (ref 19–32)
Calcium: 8.4 mg/dL (ref 8.4–10.5)
Calcium: 9.2 mg/dL (ref 8.4–10.5)
Calcium: 9.5 mg/dL (ref 8.4–10.5)
Chloride: 104 mEq/L (ref 96–112)
Chloride: 105 mEq/L (ref 96–112)
Chloride: 106 mEq/L (ref 96–112)
Creatinine, Ser: 1.7 mg/dL — ABNORMAL HIGH (ref 0.4–1.5)
GFR calc Af Amer: 39 mL/min — ABNORMAL LOW (ref >60)
GFR calc Af Amer: 43 mL/min — ABNORMAL LOW (ref >60)
GFR calc Af Amer: 43 mL/min — ABNORMAL LOW (ref >60)
GFR calc Af Amer: 47 mL/min — ABNORMAL LOW (ref >60)
GFR calc non Af Amer: 29 mL/min — ABNORMAL LOW (ref >60)
GFR calc non Af Amer: 36 mL/min — ABNORMAL LOW (ref >60)
Glucose, Bld: 119 mg/dL — ABNORMAL HIGH (ref 70–99)
Glucose, Bld: 146 mg/dL — ABNORMAL HIGH (ref 70–99)
Glucose, Bld: 94 mg/dL (ref 70–99)
Potassium: 4.1 mEq/L (ref 3.5–5.1)
Potassium: 4.3 mEq/L (ref 3.5–5.1)
Potassium: 4.8 mEq/L (ref 3.5–5.1)
Potassium: 4.9 mEq/L (ref 3.5–5.1)
Sodium: 137 mEq/L (ref 135–145)
Sodium: 139 mEq/L (ref 135–145)
Sodium: 141 mEq/L (ref 135–145)

## 2010-03-31 LAB — URINALYSIS, ROUTINE W REFLEX MICROSCOPIC
Ketones, ur: NEGATIVE mg/dL
Urobilinogen, UA: 0.2 mg/dL (ref 0.0–1.0)

## 2010-03-31 LAB — TYPE AND SCREEN
ABO/RH(D): A POS
Antibody Screen: NEGATIVE
Unit division: 0

## 2010-03-31 LAB — PROTIME-INR
INR: 1.86 — ABNORMAL HIGH (ref 0.00–1.49)
INR: 1.93 — ABNORMAL HIGH (ref 0.00–1.49)
INR: 2.06 — ABNORMAL HIGH (ref 0.00–1.49)
Prothrombin Time: 17.7 seconds — ABNORMAL HIGH (ref 11.6–15.2)
Prothrombin Time: 22.9 seconds — ABNORMAL HIGH (ref 11.6–15.2)

## 2010-03-31 LAB — CROSSMATCH
ABO/RH(D): A POS
Antibody Screen: NEGATIVE
Unit division: 0

## 2010-03-31 LAB — SURGICAL PCR SCREEN
MRSA, PCR: NEGATIVE
Staphylococcus aureus: NEGATIVE

## 2010-03-31 LAB — PREPARE RBC (CROSSMATCH)

## 2010-04-14 ENCOUNTER — Encounter (HOSPITAL_BASED_OUTPATIENT_CLINIC_OR_DEPARTMENT_OTHER): Payer: Medicare Other | Admitting: Hematology & Oncology

## 2010-04-14 ENCOUNTER — Other Ambulatory Visit: Payer: Self-pay | Admitting: Hematology & Oncology

## 2010-04-14 DIAGNOSIS — Z7901 Long term (current) use of anticoagulants: Secondary | ICD-10-CM

## 2010-04-14 DIAGNOSIS — D4620 Refractory anemia with excess of blasts, unspecified: Secondary | ICD-10-CM

## 2010-04-14 DIAGNOSIS — I4891 Unspecified atrial fibrillation: Secondary | ICD-10-CM

## 2010-04-14 LAB — MANUAL DIFFERENTIAL (CHCC SATELLITE)
BASO: 1 % (ref 0–2)
Eos: 1 % (ref 0–7)
LYMPH: 38 % (ref 14–48)
MONO: 16 % — ABNORMAL HIGH (ref 0–13)
SEG: 44 % (ref 40–75)

## 2010-04-14 LAB — CBC WITH DIFFERENTIAL (CANCER CENTER ONLY)
HCT: 32.3 % — ABNORMAL LOW (ref 38.7–49.9)
MCH: 32 pg (ref 28.0–33.4)
MCV: 97 fL (ref 82–98)
RBC: 3.34 10*6/uL — ABNORMAL LOW (ref 4.20–5.70)
WBC: 4.5 10*3/uL (ref 4.0–10.0)

## 2010-04-14 LAB — RETICULOCYTES (CHCC): ABS Retic: 24 10*3/uL (ref 19.0–186.0)

## 2010-04-14 LAB — CHCC SATELLITE - SMEAR

## 2010-04-14 LAB — FERRITIN: Ferritin: 1515 ng/mL — ABNORMAL HIGH (ref 22–322)

## 2010-04-30 LAB — BASIC METABOLIC PANEL
CO2: 28 mEq/L (ref 19–32)
Chloride: 99 mEq/L (ref 96–112)
GFR calc non Af Amer: 42 mL/min — ABNORMAL LOW (ref >60)
Glucose, Bld: 106 mg/dL — ABNORMAL HIGH (ref 70–99)
Potassium: 3.8 mEq/L (ref 3.5–5.1)
Sodium: 135 mEq/L (ref 135–145)

## 2010-04-30 LAB — CBC
HCT: 25.4 % — ABNORMAL LOW (ref 39.0–52.0)
Hemoglobin: 8.9 g/dL — ABNORMAL LOW (ref 13.0–17.0)
MCHC: 35 g/dL (ref 30.0–36.0)
MCV: 108.6 fL — ABNORMAL HIGH (ref 78.0–100.0)
RDW: 16.3 % — ABNORMAL HIGH (ref 11.5–15.5)

## 2010-05-01 LAB — LIPID PANEL
Cholesterol: 115 mg/dL (ref 0–200)
HDL: 44 mg/dL (ref >39)
LDL Cholesterol: 61 mg/dL (ref 0–99)
Total CHOL/HDL Ratio: 2.6 RATIO
Triglycerides: 48 mg/dL (ref <150)
VLDL: 10 mg/dL (ref 0–40)

## 2010-05-01 LAB — CBC
HCT: 27.8 % — ABNORMAL LOW (ref 39.0–52.0)
MCHC: 34.1 g/dL (ref 30.0–36.0)
MCV: 109.1 fL — ABNORMAL HIGH (ref 78.0–100.0)
Platelets: 110 10*3/uL — ABNORMAL LOW (ref 150–400)
WBC: 11.1 10*3/uL — ABNORMAL HIGH (ref 4.0–10.5)

## 2010-05-01 LAB — DIFFERENTIAL
Basophils Absolute: 0 10*3/uL (ref 0.0–0.1)
Eosinophils Absolute: 0 10*3/uL (ref 0.0–0.7)
Lymphocytes Relative: 5 % — ABNORMAL LOW (ref 12–46)
Lymphs Abs: 0.6 10*3/uL — ABNORMAL LOW (ref 0.7–4.0)
Neutrophils Relative %: 86 % — ABNORMAL HIGH (ref 43–77)

## 2010-05-01 LAB — URINALYSIS, ROUTINE W REFLEX MICROSCOPIC
Bilirubin Urine: NEGATIVE
Glucose, UA: NEGATIVE mg/dL
Ketones, ur: NEGATIVE mg/dL
Protein, ur: NEGATIVE mg/dL
Urobilinogen, UA: 0.2 mg/dL (ref 0.0–1.0)

## 2010-05-01 LAB — COMPREHENSIVE METABOLIC PANEL
ALT: 15 U/L (ref 0–53)
CO2: 25 mEq/L (ref 19–32)
Calcium: 8.7 mg/dL (ref 8.4–10.5)
Creatinine, Ser: 1.85 mg/dL — ABNORMAL HIGH (ref 0.4–1.5)
GFR calc non Af Amer: 36 mL/min — ABNORMAL LOW (ref >60)
Glucose, Bld: 148 mg/dL — ABNORMAL HIGH (ref 70–99)
Total Bilirubin: 2.2 mg/dL — ABNORMAL HIGH (ref 0.3–1.2)

## 2010-05-01 LAB — BASIC METABOLIC PANEL
BUN: 31 mg/dL — ABNORMAL HIGH (ref 6–23)
Calcium: 8.8 mg/dL (ref 8.4–10.5)
GFR calc non Af Amer: 35 mL/min — ABNORMAL LOW (ref >60)
Potassium: 4.6 mEq/L (ref 3.5–5.1)

## 2010-05-01 LAB — EXPECTORATED SPUTUM ASSESSMENT W GRAM STAIN, RFLX TO RESP C

## 2010-05-01 LAB — PROTIME-INR
INR: 3.3 — ABNORMAL HIGH (ref 0.00–1.49)
INR: 5.5 (ref 0.00–1.49)
INR: 6 (ref 0.00–1.49)
Prothrombin Time: 36.4 seconds — ABNORMAL HIGH (ref 11.6–15.2)
Prothrombin Time: 55.2 seconds — ABNORMAL HIGH (ref 11.6–15.2)

## 2010-05-01 LAB — CULTURE, RESPIRATORY W GRAM STAIN

## 2010-05-01 LAB — MAGNESIUM: Magnesium: 1.7 mg/dL (ref 1.5–2.5)

## 2010-05-01 LAB — HEMOCCULT GUIAC POC 1CARD (OFFICE): Fecal Occult Bld: NEGATIVE

## 2010-05-05 ENCOUNTER — Other Ambulatory Visit: Payer: Self-pay | Admitting: Cardiology

## 2010-05-05 DIAGNOSIS — R609 Edema, unspecified: Secondary | ICD-10-CM

## 2010-05-06 NOTE — Telephone Encounter (Signed)
Refilled per escribe request

## 2010-05-28 ENCOUNTER — Other Ambulatory Visit: Payer: Self-pay | Admitting: Hematology & Oncology

## 2010-05-28 ENCOUNTER — Encounter (HOSPITAL_BASED_OUTPATIENT_CLINIC_OR_DEPARTMENT_OTHER): Payer: Medicare Other | Admitting: Hematology & Oncology

## 2010-05-28 DIAGNOSIS — Z7901 Long term (current) use of anticoagulants: Secondary | ICD-10-CM

## 2010-05-28 DIAGNOSIS — D4620 Refractory anemia with excess of blasts, unspecified: Secondary | ICD-10-CM

## 2010-05-28 DIAGNOSIS — I4891 Unspecified atrial fibrillation: Secondary | ICD-10-CM

## 2010-05-28 LAB — CBC WITH DIFFERENTIAL (CANCER CENTER ONLY)
BASO%: 3.8 % — ABNORMAL HIGH (ref 0.0–2.0)
Eosinophils Absolute: 0.1 10*3/uL (ref 0.0–0.5)
HCT: 33 % — ABNORMAL LOW (ref 38.7–49.9)
LYMPH#: 1.8 10*3/uL (ref 0.9–3.3)
LYMPH%: 32.6 % (ref 14.0–48.0)
MCV: 97 fL (ref 82–98)
MONO#: 1.4 10*3/uL — ABNORMAL HIGH (ref 0.1–0.9)
NEUT%: 36.7 % — ABNORMAL LOW (ref 40.0–80.0)
Platelets: 151 10*3/uL (ref 145–400)
RBC: 3.4 10*6/uL — ABNORMAL LOW (ref 4.20–5.70)
RDW: 13.2 % (ref 11.1–15.7)
WBC: 5.5 10*3/uL (ref 4.0–10.0)

## 2010-05-28 LAB — FERRITIN: Ferritin: 1169 ng/mL — ABNORMAL HIGH (ref 22–322)

## 2010-05-28 LAB — CHCC SATELLITE - SMEAR

## 2010-05-28 LAB — RETICULOCYTES (CHCC): Retic Ct Pct: 0.6 % (ref 0.4–3.1)

## 2010-05-28 LAB — PROTIME-INR (CHCC SATELLITE): Protime: 15.6 Seconds — ABNORMAL HIGH (ref 10.6–13.4)

## 2010-06-03 NOTE — H&P (Signed)
[name], [name] NO.:  [id number]   MEDICAL RECORD NO.:  [id number]          PATIENT TYPE:  INP   LOCATION:  6742                         FACILITY:  [facility]   PHYSICIAN:  Sabino Donovan, MD        DATE OF BIRTH:  [birth year]   DATE OF ADMISSION:  04/16/2008  DATE OF DISCHARGE:                              HISTORY & PHYSICAL   CHIEF COMPLAINT:  Cough and fever.   HISTORY OF PRESENT ILLNESS:  The patient is a 77 year old white male  with history of diastolic heart failure, nonischemic cardiomyopathy,  history of sideroblastic anemia, and status post BiV ICD placement,  status post AV node ablation, questionable chronic kidney disease with a  creatinine of 1.9, and baseline AFib on Coumadin therapy who presented  with complaint of cough expectorating yellow sputum x1 day and reports  he felt run down all day yesterday.  This morning, he was noted to have  fever and then presented to the [facility] where he was noted to have  fever of 101.9 with elevated white count and chest x-ray suggestive of  right lower lobe pneumonia.  He further reports that his son has been  sick at home as well with a similar complaint.  Otherwise, he denies any  CHF symptoms, pedal edema, orthopnea, or PND.  Denies any chest pain.  Reports that it is not similar to his prior episode of CHF exacerbation.  He reports compliance with Coumadin.  However, does report some mild  shortness of breath.   PAST MEDICAL HISTORY:  1. Diastolic congestive heart failure.  2. Nonischemic cardiomyopathy.  3. History of sideroblastic anemia.  4. Status post BiV ICD placement, status post AV node ablation.  5. Questionable chronic kidney disease.  His creatinine on last      admission was 1.8 to 1.9.  6. Atrial fibrillation.   FAMILY HISTORY:  Noncontributory.   SOCIAL HISTORY:  Negative x3.   DRUG ALLERGIES:  No known drug allergies.   MEDICATIONS:  1. Coumadin 5 mg p.o. nightly.  2. Finasteride 50  mg p.o. daily.  3. Uroxatral 10 mg daily.  4. Sotalol 120 mg p.o. b.i.d.  5. Multivitamin 1 tablet p.o. daily.  6. Lasix, questionable dose.  7. Metoprolol, questionable dose.   REVIEW OF SYMPTOMS:  Otherwise, unremarkable.   PHYSICAL EXAMINATION:  VITAL SIGNS:  Temperature 101.5, pulse 80,  respiratory rate 24, blood pressure 118/65, satting 100% on room air.  GENERAL:  In general, in no acute distress.  HEENT:  PERRLA.  EOMI.  NECK:  No lymphadenopathy, thyromegaly.  No JVD.  CHEST:  Largely clear to auscultation bilaterally.  He did have positive  egophony in the right lower lobe.  HEART:  Regular rate and rhythm, but no murmurs, rubs, or gallops.  ABDOMEN:  Soft, nontender, and nondistended.  Normoactive bowel sounds.  EXTREMITIES:  No clubbing, cyanosis, or edema.  He had palpable pulses.   LABORATORY DATA:  Sodium 138, potassium 4.5, BUN 28, creatinine 1.9,  white count 11.1, H and H 9.5 and 27.8, platelets 110, and bilirubin is  2.2,  total protein 5.8, albumin 3.6, calcium 8.7, INR 5.5.  Chest x-ray  showed right lower lobe infiltrate.  EKG showed AFib, no significant ST  or T-wave changes.   IMPRESSION:  The patient is a 77 year old white male with a past medical  history significant for diastolic congestive heart failure, nonischemic  cardiomyopathy, history of sideroblastic anemia, status post BiV ICD  placement, atrial fibrillation and a questionable chronic kidney disease  who is now admitted for right lower lobe pneumonia.  1. Right lower lobe pneumonia evident by elevated white blood cell      count, fever, and question of right lower lobe infiltrate on chest      x-ray.  He was given Rocephin and azithromycin in the ER.  We will      continue the treatment for now.  We will hold off on Lasix and hold      off any IV fluids for now given the history of nonischemic      cardiomyopathy.  His blood pressure was found to be stable.  We      will check BNP.  2.  Nonischemic cardiomyopathy.  He currently seems euvolemic.  At this      time, we will hold off on Lasix and check BNP.  We will continue to      follow his basic metabolic panel to see whether his creatinine is      stable.  We will continue other cardiac medications.  The patient      will get a dosing of metoprolol and we will reinitiate treatment      once the dosing is confirmed.  3. Atrial fibrillation.  He continues to be in AFib.  However, rate is      controlled.  We will continue Sotalol.  He has a biventricular      implantable cardioverter-defibrillator and has a atrioventricular      node ablation.  4. Questionable chronic kidney disease.  The patient has elevated      creatinine on this presentation.  However, after reviewing his      record from past admission, he was noted to have creatinine of 1.9      at that time as well.  Question whether it is chronically elevated,      given nonischemic cardiomyopathy and high blood pressure.  We will      continue to follow for now.  We will hold off on aggressive IV      fluids at this time given nonischemic cardiomyopathy.  5. Prophylaxis, Lovenox, and Protonix.  6. Sideroblastic anemia.  Currently, medically stable.  We will      continue to follow.  7. Supratherapeutic INR.  We will hold Coumadin for now.  No evidence      of bleeding.  We will hold off on vitamin K or fresh frozen plasma      at this time and follow INR daily.      Sabino Donovan, MD  Electronically Signed     MJ/MEDQ  D:  04/17/2008  T:  04/17/2008  Job:  [id number]

## 2010-06-03 NOTE — Cardiovascular Report (Signed)
[name], [name] NO.:  [id number]   MEDICAL RECORD NO.:  [id number]          PATIENT TYPE:  OIB   LOCATION:  2899                         FACILITY:  [facility]   PHYSICIAN:  Madaline Savage, M.D.DATE OF BIRTH:  [birth year]   DATE OF PROCEDURE:  10/28/2006  DATE OF DISCHARGE:                            CARDIAC CATHETERIZATION   PROCEDURE PERFORMED:  Elective direct current cardioversion, outpatient.   OPERATOR:  Madaline Savage, M.D.   ANESTHESIOLOGIST:  Sheldon Silvan, M.D.   ENERGIES GIVEN:  120, 160 and 200 watt seconds of energy on three  different occasions.   COMPLICATIONS:  None.   RESULTS:  Atrial fibrillation at baseline with a relatively fast  ventricular response.  He was attempted to be cardioverted back to sinus  rhythm without success.   AGENT USED:  Pentothal 250 mg intravenously.   CLINICAL PROFILE:  The patient is a delightful, 77 year old gentleman  who has been in atrial fibrillation with fair rate control with usual  rate-slowing agents.  Specifically, his medications have included  sotalol 120 mg in the morning, 80 in the evening, Coumadin as followed  by Dr. Myna Hidalgo.  The patient has an ejection fraction of 45-50% and has  left ventricular dysfunction with ejection fractions of 20-25% in the  past.   He would appear to be a candidate for atrial fibrillation/flutter  ablation at some time in the future.  We will find out which hospital  with an electrophysiology program the patient's insurance coverage will  honor and we will pursue an electrophysiology consultation at that  institution.           ______________________________  Madaline Savage, M.D.     WHG/MEDQ  D:  10/28/2006  T:  10/29/2006  Job:  [id number]   cc:   Rose Phi. Myna Hidalgo, M.D.

## 2010-06-03 NOTE — Discharge Summary (Signed)
NAMESAYED, APOSTOL NO.:  [id number]   MEDICAL RECORD NO.:  [id number]          PATIENT TYPE:  INP   LOCATION:  6742                         FACILITY:  [facility]   PHYSICIAN:  Beckey Rutter, MD  DATE OF BIRTH:  [birth year]   DATE OF ADMISSION:  04/17/2008  DATE OF DISCHARGE:  04/20/2008                               DISCHARGE SUMMARY   CHIEF COMPLAINT:  Cough and fever.   HISTORY OF PRESENT ILLNESS:  A 77 year old very pleasant Caucasian male  with history of diastolic heart failure, nonischemic cardiomyopathy,  sideroblastic anemia, status post BiV ICD placement, and status post AV  node ablation, presented with above complaint.  The patient was  diagnosed as pneumonia and he improved during the hospital stay.  The  patient has been fever free for the last 24 hours with no shortness of  breath.  The patient is stable for discharge.   HOSPITAL PROCEDURES:  Today, his sodium is 135, potassium 3.8, chloride  99, bicarb is 28, glucose 106, BUN is 24, and creatinine 1.59.  Estimated GFR is 42.   Chest x-ray on April 16, 2008, impression is questionable development  infiltrate in the right trunk.   DISCHARGE DIAGNOSES:  1. Right lung pneumonia.  2. Coumadin toxicity.  3. Nonischemic cardiomyopathy.  4. History of sideroblastic anemia.  5. Status post biventricular implantable cardioverter-defibrillator      placement.  6. Status post arteriovenous node ablation  7. Chronic kidney disease.   PROBLEMS DURING HOSPITALIZATION:  1. Pneumonia, treated with Zithromax and Rocephin during hospital      stay.  The patient will be discharged on Augmentin.  2. Coumadin toxicity.  The patient was found to have INR of 5.5 during      admission.  The next day, the INR is 6.  Currently, his INR is      pending, will be reviewed from before his discharge.  It is      important to follow up with the patient primary physician to adjust      the Coumadin dose.  He will be  discharged with 4 mg of Coumadin at      night rather than 5 mg he used to take.  3. Atrial fibrillation.  As discussed above. the patient is on beta-      blocker and biventricular implantable cardioverter/defibrillator      and had history of node ablation.  During the hospital stay, the      patient's heart rate remained controlled.  The anticoagulation is      as discussed above.  4. Questionable chronic kidney disease with estimated GFR of 42.  5. Sideroblastic anemia.  His hemoglobin remained stable during the      hospital stay.   DISCHARGE MEDICATIONS:  1. Coumadin 4 mg p.o. at night.  2. Finasteride 5 mg p.o. nightly.  3. Uroxatral 10 mg daily.  4. Sotalol 120 mg p.o. b.i.d.  5. Multivitamins daily.  6. Lasix 40 mg p.o. weekly.  The patient received a dose of Lasix on      April 18, 2008.  7. Aspirin 81 mg p.o. daily.   DISCHARGE PLAN:  The patient was discharged to follow up with his  primary physician within 3 days to evaluate his INR and dose of  Coumadin.  He is aware and agreeable to discharge plan.      Beckey Rutter, MD  Electronically Signed     EME/MEDQ  D:  04/20/2008  T:  04/20/2008  Job:  [id number]

## 2010-06-03 NOTE — Discharge Summary (Signed)
NAMEEZARIAH, NACE NO.:  [id number]   MEDICAL RECORD NO.:  [id number]          PATIENT TYPE:  OBV   LOCATION:  4704                         FACILITY:  [facility]   PHYSICIAN:  Cristy Hilts. Jacinto Halim, MD       DATE OF BIRTH:  08/22/1927   DATE OF ADMISSION:  06/12/2007  DATE OF DISCHARGE:  06/14/2007                               DISCHARGE SUMMARY   DISCHARGE DIAGNOSES:  1. Acute on chronic diastolic congestive heart failure.  2. Non-ischemic cardiomyopathy.  3. Sideroblastic anemia, in the past had chemotherapy.  4. Status post atrioventricular node ablation.  5. Status post upgrade the biventricular implantable cardioverter-      defibrillator in December 2008.  The patient has Medtronic Sync III      device.  6. Benign prostatic hypertrophy.  7. Anticoagulation with Coumadin therapy.   HISTORY OF PRESENT ILLNESS:  This is a 77 year old gentleman who  presented to the emergency room on Jun 12, 2007 with complaint of severe  dyspnea.  The patient stated that most of the nights he just sits up  because he is unable to recline.  He stated that during the last month,  he probably slept only 2 or 3 nights.  He also complains of weakness and  swelling.   In the emergency room, his EKG revealed no acute ST-T wave changes, but  palpitations with minute type of bigeminy.   In the past, the patient underwent upgrade of ICD to biventricular  device, but had his LV lead turned off in [facility].  Then,  currently, it is working good with threshold, however.   We admitted the patient for IV diuresis and he responded beautifully to  IV Lasix.   His swelling significantly improved.  On admission, BNP was greater than  1200, and on the day of discharge, it measured around 578.  The patient  was started on Lovaza and diuretic was discontinued.   HOSPITAL LABORATORIES:  His BNP was elevated on admission greater than  1200, but on the day of discharge it was 578.  INR was  therapeutic at  2.8.   BMP showed sodium 140, potassium 3.8, chloride 100, CO2 29, glucose 94,  BUN 37, creatinine 1.96, and calcium 9.5.  His magnesium was normal, 1.9  and TSH normal, 2.984.  CBC showed white blood cell count 4.5,  hemoglobin 10.1, hematocrit 29.9, and platelet count is 128.   DISCHARGE MEDICATIONS:  1. Proscar 5 mg daily.  2. Vitamin B 600 mg t.i.d.  3. Uroxatral 10 mg daily.  4. Lopressor 25 mg t.i.d.  5. Warfarin 5 mg daily.  6. Lovaza 1 capsule b.i.d.   The patient is stay on low-fat, low-cholesterol diet.   RECOMMENDATIONS:  On admission, the patient had LV lead turned off at  the [facility].  Right now, the patient works with good threshold,  but however, again if he feels that he is doing well, we will continue  the same setting of single-chamber ventricular pacing only with a rate  of 80 beats per minutes, but in case if the patient  becomes symptomatic,  then we will need to have the biventricular pacing at 80 beats per  minute.  We will also follow the BNP in 3 days.   DISCHARGE FOLLOWUP:  Dr. Truett Perna Service will contact the patient to  schedule appointment for followup.      Raymon Mutton, P.A.      Cristy Hilts. Jacinto Halim, MD  Electronically Signed    MK/MEDQ  D:  06/14/2007  T:  06/14/2007  Job:  [id number]

## 2010-06-06 NOTE — Discharge Summary (Signed)
NAMEVENANCIO, CHENIER NO.:  [id number]   MEDICAL RECORD NO.:  [id number]                   PATIENT TYPE:  INP   LOCATION:  2018                                 FACILITY:  [facility]   PHYSICIAN:  Madaline Savage, M.D.             DATE OF BIRTH:  [birth year]   DATE OF ADMISSION:  09/23/2002  DATE OF DISCHARGE:  09/29/2002                                 DISCHARGE SUMMARY   ADMISSION DIAGNOSES:  1. Left shoulder pain with new onset atrial fibrillation rule out myocardial     infarction.  2. Anemia.  3. Benign prostatic hypertrophy with a history of ureteral stones, followed     by Dr. Javier Glazier.   DISCHARGE DIAGNOSES:  1. Dilated nonischemic cardiomyopathy with left ventricle ejection fraction     20-25%.  2. Atrial fibrillation.  3. History of anemia.  4. Benign prostatic hypertrophy with ureteral stones.   PROCEDURES:  1. Cardiac catheterization, September 25, 2002, Dr. Elsie Lincoln.  2. A 2D echo, September 26, 2002.   BRIEF HISTORY:  The patient is a 77 year old white male, primary care  patient of Dr. Gabriel Earing.  He presented with recent bursts of a rapid  heart rate which started around September 21, 2002.  He presented to the  [facility] Emergency Room in atrial fibrillation.  Ventricular response was  in the 80s.  There were no ischemic changes on his EKG, but he did complain  of some shoulder discomfort and was admitted for evaluation.  CK, in the  emergency room were 89 and troponin was 0. 05.  Chest x-ray showed  cardiomegaly and COPD.   PAST MEDICAL HISTORY:  1. Reveals colonoscopy with polypectomy, July 25, 2001.  2. Low blood count followed by Dr. Rose Phi. Ennever.  3. Pilonidal cyst, 1990.  4. Ureteral stone extraction, 1981 by Dr. Patsi Sears.   MEDICATIONS ON ADMISSION:  1. Flomax 0.4 mg every day.  2. Ibuprofen 800 mg t.i.d.  3. B-6 every day.  4. Procrit injections every two weeks by Dr. Myna Hidalgo.   ALLERGIES:  None.   HOSPITAL COURSE:  The patient was admitted, placed on beta-blockers heparin,  IV nitroglycerin.  CKs, after admission, were 42 and 36 with an MB of 2.3  and 2.3, troponins were 0.01 and 0.02.  The patient remained stable and  underwent cardiac catheterization, on September 25, 2002.  This showed the  coronary vessels were all patent except for a small 30% stenosis of the  first diagonal.  The patient had a dilated cardiomyopathy with global  hypokinesis and ejection fraction of 20-25%.  There was no mitral  regurgitation.  At that point the patient was continued on heparin and  started on Coumadin.  His medications were altered and he was started on  Coreg, Digoxin and Altace.  He was placed on low dose Coreg 3.125 mg b.i.d.,  Altace 2.5 mg every day,  Digoxin 0.25 mg every day.  It was our plan to  titrate these medications up as his blood pressure improved.  He maintained  a blood pressure in the 100-110 range.  He was stable at this point.  His  pro time was up to 19.7 seconds with an INR of 2.1 by September 29, 2002.  He remained in atrial fibrillation with a controlled rate and it was Dr.  Landry Dyke opinion he would be ready for discharge today.  Plans were to  titrate his Coreg and his ACE and then aim for cardioversion after four  weeks of therapeutic Coumadin treatment.   He will go home on the following meds:  1. Coreg 3.125 mg b.i.d.  2. Digoxin 0.25 mg every day.  3. Multivitamin one every day.  4. Protonix 40 mg every day.  5. He will resume his B-6 as before.  6. Altace 2.5 mg every day.  7. Flomax 0.4 mg every day.  8. Coumadin 5 mg one tablet every day as directed.   He will return on October 02, 2002 for a pro time and he will return to  see Dr. Elsie Lincoln on Tuesday, October 10, 2002 at 10 a.m.   CONDITION ON DISCHARGE:  Improving.      Eber Hong, P.A.                 Madaline Savage, M.D.    WDJ/MEDQ  D:  09/29/2002  T:  09/30/2002  Job:  [id number]    cc:   Gabriel Earing, M.D.  [address]  [place]  [place] [address]  Fax: [phone]   Claudette Laws, M.D.  [address]  [place]  [place] [address]  Fax: [phone]   Rose Phi. Myna Hidalgo, M.D.  [address] [facility]  [place], [place] [address]  Fax: [phone]

## 2010-06-06 NOTE — Discharge Summary (Signed)
[name], [name] NO.:  [id number]   MEDICAL RECORD NO.:  [id number]                   PATIENT TYPE:  INP   LOCATION:  4730                                 FACILITY:  [facility]   PHYSICIAN:  Madaline Savage, M.D.             DATE OF BIRTH:  04-20-1927   DATE OF ADMISSION:  03/21/2003  DATE OF DISCHARGE:  03/23/2003                                 DISCHARGE SUMMARY   ADMISSION DIAGNOSES:  1. Paroxysm atrial fibrillation with hypotension.  2. Elevated INR on chronic Coumadin.  3. Hypotension.  4. Mild renal insufficiency.  5. Noncritical coronary artery disease.  6. Cataracts.   DISCHARGE DIAGNOSES:  1. Paroxysm atrial fibrillation with hypotension.  2. Elevated INR on chronic Coumadin.  3. Hypotension.  4. Mild renal insufficiency.  5. Noncritical coronary artery disease.  6. Cataracts.   PROCEDURE:  None.   HISTORY OF PRESENT ILLNESS:  The patient is a 77 year old white male,  medical patient of Dr. Madaline Savage, who was transported to ER at [facility] for [facility].  The patient was  scheduled for a cataract extraction, but after going to the surgical center,  developed atrial fibrillation with a rapid rate and subsequent hypotension.  His heart rate was greater than 100.  He was transported to the ER but  converted en route to the ER to a sinus rhythm.  He was treated with some  bolus of IV fluid and was seen in the ER at [facility] by  our service.  It was Dr. Evangeline Gula opinion that the patient had  PAF.   The patient previously had a history of decreased ejection fraction but at  the last study showed that left ventricular ejection fraction was now 60%  and in sinus rhythm.   PAST MEDICAL HISTORY:  1. BPH.  2. Anemia.   PAST SURGICAL HISTORY:  The patient underwent cardiac catheterization  September 2004 which showed noncritical coronary artery disease.   CURRENT MEDICATIONS:  1. Coumadin 5 mg q.d.  2. Altace 2.5 mg h.s.  3. Flomax 0.4 mg h.s.  4. Protonix 40 mg q.d.  5. Multivitamin q.d.  6. Zaroxolyn 0.125 mg q.d.   ALLERGIES:  None known.   HOSPITAL COURSE:  The patient was admitted, treated with IV bolus of fluid,  and hypotension resolved.  He converted to a sinus rhythm before being  transferred to the floor and has remained in sinus rhythm since.  He has  received initial dose of sotalol yesterday and he received a second dose  today.  He is still mildly hypotension and mildly orthostatic.  This is  being treated with fluid hydration.  In addition to his hypotension, he has  mild renal insufficiency.  Initial creatinine was 1.7.  After hydration on  admission, his creatinine dropped to 1.6.   In addition to this, there  is concern with his INR.  He has recently  recovered from bronchitis and is on an antibiotic.  On Monday of this week,  his INR was 2.3.  On admission, it was 3.5 the following day with no further  Coumadin.  On March 22, 2003, it was 27.8 seconds with an INR of 4.0.  On  March 23, 2003, the patient's pro time was 26.4 with an INR of 3.6.   Coumadin has been held since admission and we plan to continue to hold his  Coumadin another 48 hours and restart it after that on Sunday, March 25, 2003.   The patient was seen by Dr. Dani Gobble today on March 23, 2003.  It was her  opinion that overall the patient was doing well and there was funeral that  he needs to attend this weekend.  We plan to discharge him later today if  his orthostatic pressures are resolved.  He has two sets, his last set after  initiation of fluid shows a lying pressure of 112/59, sitting pressure of  124/67, and a standing pressure of 95/58.  The patient's heart remained  stable in the 60 range throughout.  He continues in sinus rhythm. We will  plan to have him come back next Monday at 9:20 a.m. for an EKG and a blood  pressure check.  We  will also have him get a pro time that morning and  monitor his Coumadin.   DISCHARGE MEDICATIONS:  1. Betapace 80 mg q.d.  2. Multivitamin 1 q.d.  3. Protonix 40 mg q.d.  4. Altace 2.5 mg h.s.  We will restart that on Sunday.  5. Flomax 0.4 mg h.s.  6. Coumadin 5 mg 1 q.d.  We will start Sunday.  7. The patient is to discontinue his Coreg and digoxin.      Eber Hong, P.A.                 Madaline Savage, M.D.    WDJ/MEDQ  D:  03/23/2003  T:  03/23/2003  Job:  [id number]   cc:   [facility]  [address]   Madaline Savage, M.D.  [address]  [place]  [place] [address]  Fax: [phone]   Rose Phi. Myna Hidalgo, M.D.  [address] [facility]  [place], [place] [address]  Fax: [phone]   Petra Kuba, M.D.  [address]  [place]  [place] [address]  Fax: [phone]

## 2010-06-06 NOTE — Op Note (Signed)
   NAMEMERRELL, BORSUK NO.:  [id number]   MEDICAL RECORD NO.:  [id number]                   PATIENT TYPE:  OUT   LOCATION:  OMED                                 FACILITY:  [facility]   PHYSICIAN:  Rose Phi. Myna Hidalgo, M.D.              DATE OF BIRTH:  [birth year]   DATE OF PROCEDURE:  05/26/2002  DATE OF DISCHARGE:                                 OPERATIVE REPORT   NATURE OF PROCEDURE:  Left posterior iliac crest bone marrow biopsy and  aspirate.   DESCRIPTION OF PROCEDURE:  Mr. Piechota is brought to the short stay unit.  Had an IV placed.  He was placed onto his right side.  He was given Versed 5  mg and Demerol 25 mg for sedation.  Preop, the left posterior iliac crest  region was prepped and draped in a sterile fashion.  1% Lidocaine was  infiltrated under the skin, down to the periosteum.  I used about 8 mL of  lidocaine.   A #11 scalpel was used to made in incision to the skin.  Two bone marrow  aspirates were obtained without difficulties.   A second incision made into the skin with a #11 scalpel.  A good bone marrow  biopsy core was obtained without difficulties.   The patient tolerated the procedure well.  There were no complications.                                               Rose Phi. Myna Hidalgo, M.D.    PRE/MEDQ  D:  05/26/2002  T:  05/26/2002  Job:  [id number]

## 2010-06-06 NOTE — Cardiovascular Report (Signed)
NAMEAJA, BOLANDER NO.:  [id number]   MEDICAL RECORD NO.:  [id number]                   PATIENT TYPE:  INP   LOCATION:  2018                                 FACILITY:  [facility]   PHYSICIAN:  Madaline Savage, M.D.             DATE OF BIRTH:  09-01-1927   DATE OF PROCEDURE:  09/25/2002  DATE OF DISCHARGE:                              CARDIAC CATHETERIZATION   PROCEDURES PERFORMED:  1. Selective coronary angiography by Judkins technique.  2. Retrograde left heart catheterization.  3. Left ventricular angiography.  4. Right heart catheterization and thermodilution cardiac outputs.   ENTRY SITE:  Right femoral.   DYE USED:  Omnipaque.   PATIENT PROFILE:  The patient is a 77 year old previously healthy and active  retired gentleman  who presented to the emergency room on September 23, 2002,  with a 2 to 3 day history of left shoulder pain. That pain was 50%  likelihood of being musculoskeletal and a 50% chance of being anginal. It  did respond to analgesics. Cardiac enzymes have been negative for myocardial  infarction. The patient was noted to be in atrial fibrillation with onset  around the same time that the shoulder pain had been going on. The patient  entered the catheterization laboratory electively for cardiac  catheterization.   RESULTS:  Pressures.  The right atrial mean pressure was 16; right  ventricular pressure 43/13; end diastolic pressure 16; pulmonary artery  pressure 43/27, mean 35; pulmonary capillary wedge mean pressure 22; A wave  equal to V wave around 22 to 23; left ventricular pressure 115/15; end  diastolic pressure 22; central aortic pressure 115/77. Thermodilution  cardiac outputs 10.0; thermodilution  cardiac index 4.3;  Fick cardiac outputs were not able to be calculated. The patient had both  arterial and pulmonary artery oxygen saturation submitted for analysis.  There  was a technical problem with the saturation  measuring device and  values could not be obtained.   Angiographic results.  The left main coronary artery was normal. It  trifurcated into a circumflex ramus intermediate branch and LAD. The ramus  and circumflex  and LAD were all normal. The  1st diagonal branch off of the  LAD contained a tapering stenosis which was smooth of about 30%. This may or  may not be a real lesion.   The right coronary artery was basically angiographically patent. The takeoff  did not appear to be in the right coronary sinus of Valsalva; rather it had  an anterior takeoff. It was best visualized with a multipurpose  hockey  stick catheter.   The left ventricle appeared  dilated, globally hypokinetic. Ejection  fraction estimate 20% to 25%. No mitral regurgitation or LV thrombus noted.   FINAL DIAGNOSES:  1. Angiographically patent coronary arteries.  2. Mildly elevated pulmonary  artery pressures.  3. Normal cardiac output determinations.  4. Left ventricular ejection fraction severely decreased,  ejection fraction     20% to 25% global in nature.   PLAN:  The patient will undergo 2D echocardiography to look at valvular  function in more detail and atrial fibrillation will be treated with  Coumadin therapy and eventually treated with cardioversion either  pharmacologically or electrically.                                               Madaline Savage, M.D.    WHG/MEDQ  D:  09/25/2002  T:  09/25/2002  Job:  [id number]

## 2010-06-06 NOTE — Procedures (Signed)
[place]  Patient:    [name], [name] Visit Number: [id number] MRN: [id number]          Service Type: END Location: ENDO Attending Physician:  Nelda Marseille Dictated by:   Petra Kuba, M.D. Proc. Date: 07/25/01 Admit Date:  07/25/2001   CC:         Davis L. Cloward, M.D.   Procedure Report  PROCEDURE:  Colonoscopy with polypectomy.  INDICATIONS:  The patient is due for colonic screening.  Mom with some sort of abdominal cancer.  Consent was signed after risks, benefits, methods, and options were thoroughly discussed in the office.  MEDICATIONS:  Demerol 100, Versed 5.  PROCEDURE:  Rectal inspection was pertinent for external hemorrhoids, small. Digital examination was negative.  The video colonoscope was inserted, easily advanced around the colon to the cecum.  This did require some abdominal pressure, but no position changes.  On insertion, some left-sided diverticuli were seen only.  The cecum was identified by the appendiceal orifice and the ileocecal valve.  Prep in the cecum and proximal ascending was fair.  Did have some stool adherent to the wall that could not be washed off.  The rest of the prep was adequate, which required some washing and suctioning.  The cecum was identified by the appendiceal orifice and the ileocecal valve.  The scope was slowly withdrawn.  Cecum, ascending, and transverse were normal.  The scope was withdrawn around the left side of the colon.  Diverticuli were seen.  No polypoid lesions, masses or other abnormalities were seen until we withdrew back to the rectum, where a small 3 mm polyp was seen, snared, electrocautery applied, and the polyp was suctioned through the scope and collected in the trap.  The scope was then retroflexed, pertinent for some small internal hemorrhoids.  The scope was straightened, readvanced a short ways up the left side of the colon, air was suctioned, and the scope was  removed.  The patient tolerated the procedure well.  There were no obvious immediate complication.  ENDOSCOPIC DIAGNOSES: 1. Internal and external small hemorrhoids. 2. Left-sided diverticuli. 3. Small rectal polyps snared. 4. Otherwise within normal limits to the cecum.  PLAN:  Await pathology to determine future colonic screening.  Happy to see back p.r.n. otherwise return care back to Dr. Earlene Plater L. Cloward for the customary health care maintenance to include yearly rectals and guaiacs. Dictated by:   Petra Kuba, M.D. Attending Physician:  Nelda Marseille DD:  07/25/01 TD:  07/26/01 Job: [id number]

## 2010-06-06 NOTE — Op Note (Signed)
NAMEMOREY, ANDONIAN NO.:  [id number]   MEDICAL RECORD NO.:  [id number]          PATIENT TYPE:  AMB   LOCATION:  DAY                          FACILITY:  [facility]   PHYSICIAN:  Thomas A. Cornett, M.D.DATE OF BIRTH:  Jan 04, 1928   DATE OF PROCEDURE:  03/23/2005  DATE OF DISCHARGE:                                 OPERATIVE REPORT   PREOPERATIVE DIAGNOSIS:  Right inguinal hernia.   POSTOPERATIVE DIAGNOSIS:  Indirect right inguinal hernia.   PROCEDURE:  Right inguinal hernia repair with large Prolene hernia system  mesh.   SURGEON:  Dr. Harriette Bouillon.   ANESTHESIA:  LMA with 20 mL  of 0.25% Sensorcaine local with epinephrine.   ESTIMATED BLOOD LOSS:  10 mL.   DRAINS:  None.   SPECIMENS:  None.   FINDINGS:  Indirect right inguinal hernia with empty hernia sac.   INDICATIONS FOR PROCEDURE:  The patient is a 77 year old male whose had a  progressively enlarging right inguinal hernia that has been symptomatic. He  presents today for repair since he is having symptoms from it. He received  appropriate preoperative clearance and informed consent was obtained.   DESCRIPTION OF PROCEDURE:  The patient was brought to the operating room,  placed supine. After induction of LMA anesthesia, the right inguinal region  was prepped and draped in a sterile fashion. He received preoperative  antibiotics. Incision was made after injection of local anesthesia under the  skin in the right inguinal crease. Dissection was carried down and the  superficial epigastric vessels were ligated with 2-0 Vicryl. I dissected  down to the aponeurosis of the external oblique and infiltrated this with  0.25% Sensorcaine. The fibers of the aponeurosis oblique externa were opened  in the direction of its fibers using Metzenbaum scissors. The ilioinguinal  nerves were identified and divided. Next the cord structures were dissected  out circumferentially and a half inch Penrose drain was used for  retraction.  An indirect sac was identified it was dissected away from the cord  structures circumferentially to the internal ring. At this point, a large  lipoma was also identified as well with it. There was nothing in the hernia  sac. It was reduced back into the preperitoneal space and I used my finger  at this point to create a space in the preperitoneal for placement of the  Prolene hernia system. There was no bleeding during this part of the  procedure. Remarkably the patient had very little bleeding during the  procedure. The large Prolene hernia system was placed using a ring forceps  in the preperitoneal space and this mesh was pushed out and deployed using  my finger. I then pulled the mesh up to the hub so it was flush with the  abdominal wall musculature. It was secured to the abdominal wall musculature  using a #0 Vicryl. This was placed at the hub of the mesh layers. A stitch  was then placed down in the pubic tubercle with #0 Vicryl to secure the  inferior aspect of the mesh. I then cut a small slit for the cord  contents  to come through. I secured the mesh circumferentially to the shelving edge  of the inguinal ligament and the conjoined tendon with #0 Novofil  circumferentially. The cord structures came out through the slit and the  slit was closed with #0 Novofil. I could place the tip of my fifth digit in  this space. At this point in time, the mesh laid quite nicely and hemostasis  was excellent with no signs of bleeding. Irrigation was used and was  suctioned out. I closed the aponeurosis of the external oblique with a 2-0  Vicryl and 3-0 Vicryl was used to close Scarpa's fascia. 4-0 Monocryl was  used to close the skin in a subcuticular fashion. Steri-Strips and dry  dressings were applied. All final counts of sponge, needle and instruments  were found be correct at this portion of the case. The patient was awoke and  taken to recovery in satisfactory  condition.      Thomas A. Cornett, M.D.  Electronically Signed     TAC/MEDQ  D:  03/23/2005  T:  03/23/2005  Job:  [id number]   cc:   Gabriel Earing, M.D.  Fax: [phone]   Rose Phi. Myna Hidalgo, M.D.  Fax: [phone]

## 2010-06-11 ENCOUNTER — Other Ambulatory Visit: Payer: Self-pay | Admitting: Hematology & Oncology

## 2010-06-11 ENCOUNTER — Encounter (HOSPITAL_BASED_OUTPATIENT_CLINIC_OR_DEPARTMENT_OTHER): Payer: Medicare Other | Admitting: Hematology & Oncology

## 2010-06-11 DIAGNOSIS — Z7901 Long term (current) use of anticoagulants: Secondary | ICD-10-CM

## 2010-06-11 DIAGNOSIS — I4891 Unspecified atrial fibrillation: Secondary | ICD-10-CM

## 2010-06-11 DIAGNOSIS — D4620 Refractory anemia with excess of blasts, unspecified: Secondary | ICD-10-CM

## 2010-06-11 LAB — PROTIME-INR (CHCC SATELLITE)

## 2010-06-23 ENCOUNTER — Other Ambulatory Visit: Payer: Self-pay | Admitting: Orthopaedic Surgery

## 2010-06-23 ENCOUNTER — Other Ambulatory Visit (HOSPITAL_COMMUNITY): Payer: Self-pay | Admitting: Orthopaedic Surgery

## 2010-06-23 ENCOUNTER — Ambulatory Visit (HOSPITAL_COMMUNITY)
Admission: RE | Admit: 2010-06-23 | Discharge: 2010-06-23 | Disposition: A | Discharge status: Home / Self Care | Payer: Medicare Other | Source: Ambulatory Visit | Attending: Orthopaedic Surgery | Admitting: Orthopaedic Surgery

## 2010-06-23 ENCOUNTER — Encounter (HOSPITAL_COMMUNITY): Payer: Medicare Other

## 2010-06-23 DIAGNOSIS — Z01818 Encounter for other preprocedural examination: Secondary | ICD-10-CM

## 2010-06-23 DIAGNOSIS — I517 Cardiomegaly: Secondary | ICD-10-CM | POA: Insufficient documentation

## 2010-06-23 DIAGNOSIS — Z01812 Encounter for preprocedural laboratory examination: Secondary | ICD-10-CM | POA: Insufficient documentation

## 2010-06-23 LAB — URINALYSIS, ROUTINE W REFLEX MICROSCOPIC
Glucose, UA: NEGATIVE mg/dL
Nitrite: NEGATIVE
Specific Gravity, Urine: 1.015 (ref 1.005–1.030)
pH: 5 (ref 5.0–8.0)

## 2010-06-23 LAB — DIFFERENTIAL
Eosinophils Absolute: 0.1 10*3/uL (ref 0.0–0.7)
Eosinophils Relative: 1 % (ref 0–5)
Lymphs Abs: 1.6 10*3/uL (ref 0.7–4.0)
Monocytes Absolute: 1.5 10*3/uL — ABNORMAL HIGH (ref 0.1–1.0)
Monocytes Relative: 27 % — ABNORMAL HIGH (ref 3–12)

## 2010-06-23 LAB — BASIC METABOLIC PANEL
BUN: 24 mg/dL — ABNORMAL HIGH (ref 6–23)
CO2: 28 mEq/L (ref 19–32)
Chloride: 105 mEq/L (ref 96–112)
Creatinine, Ser: 1.67 mg/dL — ABNORMAL HIGH (ref 0.4–1.5)

## 2010-06-23 LAB — CBC
MCH: 31.8 pg (ref 26.0–34.0)
MCV: 97 fL (ref 78.0–100.0)
Platelets: 138 10*3/uL — ABNORMAL LOW (ref 150–400)
RDW: 13.3 % (ref 11.5–15.5)

## 2010-06-23 LAB — SURGICAL PCR SCREEN: MRSA, PCR: NEGATIVE

## 2010-06-27 ENCOUNTER — Inpatient Hospital Stay (HOSPITAL_COMMUNITY)
Admission: RE | Admit: 2010-06-27 | Discharge: 2010-07-01 | DRG: 470 | Disposition: A | Discharge status: Home Health | Payer: Medicare Other | Source: Ambulatory Visit | Attending: Orthopaedic Surgery | Admitting: Orthopaedic Surgery

## 2010-06-27 ENCOUNTER — Inpatient Hospital Stay (HOSPITAL_COMMUNITY): Payer: Medicare Other

## 2010-06-27 DIAGNOSIS — Z7901 Long term (current) use of anticoagulants: Secondary | ICD-10-CM

## 2010-06-27 DIAGNOSIS — Z950 Presence of cardiac pacemaker: Secondary | ICD-10-CM

## 2010-06-27 DIAGNOSIS — M1710 Unilateral primary osteoarthritis, unspecified knee: Principal | ICD-10-CM | POA: Diagnosis present

## 2010-06-27 DIAGNOSIS — I4891 Unspecified atrial fibrillation: Secondary | ICD-10-CM | POA: Diagnosis present

## 2010-06-27 DIAGNOSIS — Z96659 Presence of unspecified artificial knee joint: Secondary | ICD-10-CM

## 2010-06-27 DIAGNOSIS — D62 Acute posthemorrhagic anemia: Secondary | ICD-10-CM | POA: Diagnosis not present

## 2010-06-27 LAB — PROTIME-INR: INR: 1.26 (ref 0.00–1.49)

## 2010-06-27 LAB — APTT: aPTT: 37 seconds (ref 24–37)

## 2010-06-28 LAB — BASIC METABOLIC PANEL
BUN: 19 mg/dL (ref 6–23)
CO2: 28 mEq/L (ref 19–32)
Chloride: 102 mEq/L (ref 96–112)
Glucose, Bld: 138 mg/dL — ABNORMAL HIGH (ref 70–99)
Potassium: 4.5 mEq/L (ref 3.5–5.1)
Sodium: 136 mEq/L (ref 135–145)

## 2010-06-28 LAB — CBC
HCT: 25.8 % — ABNORMAL LOW (ref 39.0–52.0)
Hemoglobin: 8.6 g/dL — ABNORMAL LOW (ref 13.0–17.0)
MCHC: 33.3 g/dL (ref 30.0–36.0)
RBC: 2.66 MIL/uL — ABNORMAL LOW (ref 4.22–5.81)

## 2010-06-28 LAB — PREPARE RBC (CROSSMATCH)

## 2010-06-28 LAB — PROTIME-INR: INR: 1.4 (ref 0.00–1.49)

## 2010-06-29 LAB — BASIC METABOLIC PANEL
CO2: 28 mEq/L (ref 19–32)
Chloride: 102 mEq/L (ref 96–112)
Creatinine, Ser: 1.77 mg/dL — ABNORMAL HIGH (ref 0.4–1.5)
GFR calc Af Amer: 45 mL/min — ABNORMAL LOW (ref >60)
Sodium: 135 mEq/L (ref 135–145)

## 2010-06-29 LAB — PROTIME-INR
INR: 1.64 — ABNORMAL HIGH (ref 0.00–1.49)
Prothrombin Time: 19.6 seconds — ABNORMAL HIGH (ref 11.6–15.2)

## 2010-06-29 LAB — CBC
HCT: 25.3 % — ABNORMAL LOW (ref 39.0–52.0)
Hemoglobin: 8.3 g/dL — ABNORMAL LOW (ref 13.0–17.0)
MCHC: 32.8 g/dL (ref 30.0–36.0)
RBC: 2.68 MIL/uL — ABNORMAL LOW (ref 4.22–5.81)
WBC: 13.3 10*3/uL — ABNORMAL HIGH (ref 4.0–10.5)

## 2010-06-30 LAB — BASIC METABOLIC PANEL
BUN: 25 mg/dL — ABNORMAL HIGH (ref 6–23)
CO2: 28 mEq/L (ref 19–32)
Chloride: 99 mEq/L (ref 96–112)
GFR calc Af Amer: 43 mL/min — ABNORMAL LOW (ref >60)
Potassium: 3.6 mEq/L (ref 3.5–5.1)

## 2010-06-30 LAB — CBC
HCT: 25 % — ABNORMAL LOW (ref 39.0–52.0)
Platelets: 88 10*3/uL — ABNORMAL LOW (ref 150–400)
RBC: 2.67 MIL/uL — ABNORMAL LOW (ref 4.22–5.81)
RDW: 14.4 % (ref 11.5–15.5)
WBC: 10.2 10*3/uL (ref 4.0–10.5)

## 2010-06-30 LAB — PROTIME-INR: Prothrombin Time: 22.5 seconds — ABNORMAL HIGH (ref 11.6–15.2)

## 2010-07-01 LAB — TYPE AND SCREEN
Unit division: 0
Unit division: 0

## 2010-07-01 LAB — HEMOGLOBIN AND HEMATOCRIT, BLOOD
HCT: 27.3 % — ABNORMAL LOW (ref 39.0–52.0)
Hemoglobin: 9.3 g/dL — ABNORMAL LOW (ref 13.0–17.0)

## 2010-07-01 LAB — BASIC METABOLIC PANEL
BUN: 24 mg/dL — ABNORMAL HIGH (ref 6–23)
Calcium: 9.4 mg/dL (ref 8.4–10.5)
GFR calc non Af Amer: 44 mL/min — ABNORMAL LOW (ref >60)
Glucose, Bld: 94 mg/dL (ref 70–99)

## 2010-07-01 LAB — PROTIME-INR: Prothrombin Time: 24.9 seconds — ABNORMAL HIGH (ref 11.6–15.2)

## 2010-07-05 NOTE — Op Note (Signed)
NAMEAUDY, DAUPHINE NO.:  [id number]  MEDICAL RECORD NO.:  [id number]  LOCATION:  1620                         FACILITY:  [facility]  PHYSICIAN:  Vanita Panda. Magnus Ivan, M.D.DATE OF BIRTH:  [birth year]  DATE OF PROCEDURE:  06/27/2010 DATE OF DISCHARGE:                              OPERATIVE REPORT   PREOPERATIVE DIAGNOSIS:  Severe osteoarthritis and degenerative joint disease, left knee.  POSTOPERATIVE DIAGNOSIS:  Severe osteoarthritis and degenerative joint disease, left knee.  PROCEDURE:  Left total knee arthroplasty utilizing computer navigation as assistant.  IMPLANTS:  [employer] rotating platform knee with size 5 femur, size 5 tibial tray, size 10 mm polyethylene insert, 41 mm patellar button.  SURGEON:  Vanita Panda. Magnus Ivan, M.D.  ASSISTANT:  Janace Litten, OPA  ANESTHESIA: 1. Left leg femoral nerve block. 2. General.  ANTIBIOTICS:  2 g IV Ancef.  TOURNIQUET TIME:  Less than 2 hours.  BLOOD LOSS:  800 cc.  COMPLICATIONS:  None.  INDICATIONS:  Mr. [name] is an 77 year old gentleman with debilitating arthritis involving his left knee.  He has had a previous right total knee arthroplasty, now presents for a left total knee.  The risks and benefits of this had been explained to him in detail and he does wish to proceed with surgery, given the debilitating nature of his pain.  DESCRIPTION OF PROCEDURE:  After informed consent was obtained, appropriate right leg was marked.  Anesthesia was obtained with femoral nerve block on the left side.  He was then brought to the operating room and placed supine on the operating table.  General anesthesia was then obtained.  A Foley catheter was placed and a non-sterile tourniquet was placed around his upper left leg.  His left leg was prepped and draped from the thigh down to the ankle with DuraPrep and sterile drapes including a sterile stockinette.  A time-out was called, which identified  the correct patient and the correct left knee.  We then used an Esmarch to wrap out the leg and tourniquet was inflated to 300 mm of pressure.  A midline incision was then made directly over the patella and carried proximally and distally.  I dissected down to the knee joint itself and then I performed a medial parapatellar arthrotomy.  I cleaned the debris from knee including osteophytes, ACL and PCL remnants, the medial and lateral meniscal remnants and removed bone spurs and osteophytes from around the knee.  We then proceeded with the computer navigation portion of the case.  Two Steinmann pins were then inserted through 2 small stab incisions in the tibia and then 2 through to the main incision in the femur.  Off these Steinmann pins, small globes were placed, so then we could select points from throughout the knee, the hip rotation as well as the ankle to cerate a computer generated map and model of the knee.  Based on this computer generated model, this allowed [place] to make all of our cuts in accordance to the computer plan to allow for improved alignment of the knee.  We first made a distal tibia cut and took 10 mm off the high side.  We then made a distal femur  cut base on size 5 femur in accordance with our plan and then took about 11 mm distally.  We balanced this with flexion and extension blocks as well after making chamfer cuts on the distal femur and a setting rotation guide.  We then made femoral box cut followed by the drilling for the tibia for the keel and the post.  We then trialed a size 5 tibia followed by a 5 femur and a 10 mm polyethylene insert and this was stable.  We drilled and cut for a 41 mm patellar button.  With all trial components in place, the knee had excellent range of motion and with the computer plan, we had balanced flexion and extension gaps as well as medial and lateral and full extension.  I then removed all trial instrumentation.  We cemented the  real size 5 femur, the size 5 tibial tray, placed the real 10 mm polyethylene insert and cemented into the patellar button.  We then let the tourniquet down and we used pulsatile lavage to irrigate the knee.  We did have to use electrocautery on several areas where bleeding was quite extensive.  He had a total of 800 cc of blood loss.  We  placed a medium Hemovac drain and I closed the arthrotomy with interrupted #1 Vicryl suture followed by 2-0 Vicryl in subcutaneous tissue and interrupted staples on the skin.  This was after all the Steinmann pins were removed as well and these incisions were closed.  We then placed a well-padded sterile dressing and a knee immobilizer.  He was awakened, extubated, and taken to the recovery room in stable condition.  All final counts were correct.  There were no complications noted.     Vanita Panda. Magnus Ivan, M.D.     CYB/MEDQ  D:  06/27/2010  T:  06/28/2010  Job:  [id number]  Electronically Signed by Doneen Poisson M.D. on 07/05/2010 08:33:44 PM

## 2010-07-05 NOTE — H&P (Signed)
  [name], RYNER NO.:  [id number]  MEDICAL RECORD NO.:  [id number]  LOCATION:  0007                         FACILITY:  [facility]  PHYSICIAN:  Vanita Panda. Magnus Ivan, M.D.DATE OF BIRTH:  [birth year]  DATE OF ADMISSION:  06/27/2010 DATE OF DISCHARGE:                             HISTORY & PHYSICAL   CHIEF COMPLAINT:  Left knee pain with known severe degenerative joint disease and osteoarthritis.  HISTORY OF PRESENT ILLNESS:  [name] is an 77 year old gentleman well known to me.  He is about 6 months now status post right total knee arthroplasty.  He has severe arthritis in his left knee.  He did so well with his right knee.  He wished to proceed with the total knee arthroplasty on the left knee.  He understands the risks and benefits of this having been through this before including failure to injections. He wishes now to proceed.  PAST MEDICAL HISTORY: 1. Pacemaker placement.2. Prostate disease. 3. Arthritis. 4. Cataracts.  MEDICATIONS: 1. Vitamin B6. 2. Multivitamin. 3. Uroxatral. 4. Prevacid. 5. Carvedilol. 6. Finasteride. 7. Coumadin. 8. Lasix.  ALLERGIES:  No known drug allergies.  FAMILY MEDICAL HISTORY:  Heart disease.  SOCIAL HISTORY:  He is married. His wife is well known to me.  He is retired.  He does not smoke and does not drink.  REVIEW OF SYSTEMS:  Negative for chest pain, shortness of breath, fever, chills, nausea and vomiting.  PHYSICAL EXAMINATION:  VITAL SIGNS:  He is afebrile with stable vital signs. GENERAL:  In general, he is alert and oriented x3, in no acute distress. HEENT:  Normocephalic, atraumatic.  Pupils equal, round and reactive to light. NECK:  Supple. LUNGS:  Clear to auscultation bilaterally. HEART:  Regular rate and rhythm. ABDOMEN:  Benign. EXTREMITIES:  Left knee shows painful medial and lateral joint lines with patellofemoral crepitation and excellent range of motion.  X-rays shows end-stage  arthritis of his left knee.  IMPRESSION:  This is an 77 year old gentleman with severe osteoarthritis of his left knee.  PLAN:  We will proceed with left total knee arthroplasty today.  Again he understands the risks and benefits and what is involved and does wish to proceed with surgery.     Vanita Panda. Magnus Ivan, M.D.     CYB/MEDQ  D:  06/27/2010  T:  06/27/2010  Job:  [id number]  Electronically Signed by Doneen Poisson M.D. on 07/05/2010 08:33:40 PM

## 2010-07-15 NOTE — Discharge Summary (Signed)
  [name], [name] NO.:  [id number]  MEDICAL RECORD NO.:  [id number]  LOCATION:  1620                         FACILITY:  [facility]  PHYSICIAN:  Vanita Panda. Magnus Ivan, M.D.DATE OF BIRTH:  [birth year]  DATE OF ADMISSION:  06/27/2010 DATE OF DISCHARGE:  07/01/2010                              DISCHARGE SUMMARY   ADMITTING DIAGNOSIS:  Severe osteoarthritis and degenerative joint disease, left knee.  DISCHARGE DIAGNOSIS:  Severe osteoarthritis and degenerative joint disease, left knee.  PROCEDURE:  Left total knee arthroplasty on June 27, 2010.  HOSPITAL COURSE:  [name] is a very pleasant gentleman with debilitating arthritis involving both his knees.  He was taken to the operating room on the admission for elective total knee arthroplasty. His postoperative course is complicated by acute blood loss anemia and did require transfusion.  He has chronic anemia as well.  By the day of discharge, he was doing well and it is felt he can be discharged safely to home.  DISPOSITION:  Discharge to home.  DISCHARGE INSTRUCTIONS:  While he is at home, will continue to weight bear as tolerated on the left knee, will work on range of motion, balance, maintaining coordination.  Follow up will be established with our office in 2 weeks.  DISCHARGE MEDICATIONS: 1. See medication reconciliation orders for continuing all previous     home medications. 2. Percocet. 3. Robaxin.     Vanita Panda. Magnus Ivan, M.D.     CYB/MEDQ  D:  07/05/2010  T:  07/06/2010  Job:  [id number]  Electronically Signed by Doneen Poisson M.D. on 07/15/2010 12:25:48 PM

## 2010-07-21 ENCOUNTER — Encounter (HOSPITAL_BASED_OUTPATIENT_CLINIC_OR_DEPARTMENT_OTHER): Payer: Medicare Other | Admitting: Hematology & Oncology

## 2010-07-21 ENCOUNTER — Other Ambulatory Visit: Payer: Self-pay | Admitting: Hematology & Oncology

## 2010-07-21 DIAGNOSIS — I4891 Unspecified atrial fibrillation: Secondary | ICD-10-CM

## 2010-07-21 DIAGNOSIS — D649 Anemia, unspecified: Secondary | ICD-10-CM

## 2010-07-21 DIAGNOSIS — Z7901 Long term (current) use of anticoagulants: Secondary | ICD-10-CM

## 2010-07-21 DIAGNOSIS — D4620 Refractory anemia with excess of blasts, unspecified: Secondary | ICD-10-CM

## 2010-07-21 LAB — CBC WITH DIFFERENTIAL (CANCER CENTER ONLY)
MCH: 31.5 pg (ref 28.0–33.4)
Platelets: 246 10*3/uL (ref 145–400)
RBC: 3.68 10*6/uL — ABNORMAL LOW (ref 4.20–5.70)

## 2010-07-21 LAB — PROTIME-INR (CHCC SATELLITE)
INR: 2.2 (ref 2.0–3.5)
Protime: 26.4 Seconds — ABNORMAL HIGH (ref 10.6–13.4)

## 2010-07-21 LAB — MANUAL DIFFERENTIAL (CHCC SATELLITE)
Eos: 1 % (ref 0–7)
LYMPH: 16 % (ref 14–48)
MONO: 24 % — ABNORMAL HIGH (ref 0–13)
RBC Comments: NORMAL
SEG: 59 % (ref 40–75)

## 2010-07-21 LAB — RETICULOCYTES (CHCC): ABS Retic: 29.9 10*3/uL (ref 19.0–186.0)

## 2010-07-21 LAB — CHCC SATELLITE - SMEAR

## 2010-08-26 LAB — PACEMAKER DEVICE OBSERVATION

## 2010-09-04 ENCOUNTER — Encounter: Payer: Self-pay | Admitting: Cardiology

## 2010-09-08 ENCOUNTER — Other Ambulatory Visit: Payer: Self-pay | Admitting: Hematology & Oncology

## 2010-09-08 ENCOUNTER — Encounter (HOSPITAL_BASED_OUTPATIENT_CLINIC_OR_DEPARTMENT_OTHER): Payer: Medicare Other | Admitting: Hematology & Oncology

## 2010-09-08 LAB — IRON AND TIBC: UIBC: <55 ug/dL

## 2010-09-08 LAB — MANUAL DIFFERENTIAL (CHCC SATELLITE)
ALC: 1.8 10*3/uL (ref 0.9–3.3)
ANC (CHCC HP manual diff): 1.3 10*3/uL — ABNORMAL LOW (ref 1.5–6.5)
BASO: 1 % (ref 0–2)
LYMPH: 50 % — ABNORMAL HIGH (ref 14–48)
MONO: 13 % (ref 0–13)
SEG: 36 % — ABNORMAL LOW (ref 40–75)

## 2010-09-08 LAB — PROTIME-INR (CHCC SATELLITE): Protime: 21.6 Seconds — ABNORMAL HIGH (ref 10.6–13.4)

## 2010-09-08 LAB — CBC WITH DIFFERENTIAL (CANCER CENTER ONLY)
HCT: 31.3 % — ABNORMAL LOW (ref 38.7–49.9)
HGB: 10.6 g/dL — ABNORMAL LOW (ref 13.0–17.1)
RBC: 3.38 10*6/uL — ABNORMAL LOW (ref 4.20–5.70)
WBC: 3.5 10*3/uL — ABNORMAL LOW (ref 4.0–10.0)

## 2010-09-15 ENCOUNTER — Encounter: Payer: Self-pay | Admitting: Cardiology

## 2010-09-15 ENCOUNTER — Ambulatory Visit (INDEPENDENT_AMBULATORY_CARE_PROVIDER_SITE_OTHER): Payer: Medicare Other | Admitting: Cardiology

## 2010-09-15 VITALS — BP 100/60 | HR 82 | Wt 217.0 lb

## 2010-09-15 DIAGNOSIS — I4891 Unspecified atrial fibrillation: Secondary | ICD-10-CM | POA: Insufficient documentation

## 2010-09-15 DIAGNOSIS — Z950 Presence of cardiac pacemaker: Secondary | ICD-10-CM | POA: Insufficient documentation

## 2010-09-15 DIAGNOSIS — D469 Myelodysplastic syndrome, unspecified: Secondary | ICD-10-CM

## 2010-09-15 DIAGNOSIS — I5022 Chronic systolic (congestive) heart failure: Secondary | ICD-10-CM

## 2010-09-15 DIAGNOSIS — I509 Heart failure, unspecified: Secondary | ICD-10-CM

## 2010-09-15 NOTE — Assessment & Plan Note (Signed)
The patient has a history of chronic atrial fibrillation and is on Coumadin.  He has not been experiencing any thromboembolic complications.  He's not having any symptoms of overt congestive heart failure.

## 2010-09-15 NOTE — Assessment & Plan Note (Signed)
The patient has a functioning ventricular pacemaker.  This is followed at [facility].  He is in a clinical study there.

## 2010-09-15 NOTE — Progress Notes (Signed)
[name] Date of Birth:  05/12/1927 [facility] [address]   [address] [place], [place]  [address] [phone]           Fax   [phone]  History of Present Illness: This pleasant elderly gentleman is seen for a scheduled followup office visit.  He has a history of chronic atrial fibrillation.  He has a functioning ventricular pacemaker.  He is part of a pacemaker clinical trial at [facility].  That trial we'll and later this year.  The patient has a past history of mild left ventricular dysfunction with an ejection fraction of 45-50% by echocardiogram done in May of 2010.  The patient also has a history of chronic anemia and myelodysplastic syndrome and requires occasional Aranesp shots if his hemoglobin falls below 11.  He is on Coumadin managed by Dr. Rexene Edison for his chronic atrophic fibrillation.  Current Outpatient Prescriptions  Medication Sig Dispense Refill  . alfuzosin (UROXATRAL) 10 MG 24 hr tablet Take 10 mg by mouth daily.        . carvedilol (COREG) 6.25 MG tablet Take 6.25 mg by mouth 2 (two) times daily with a meal.        . Famotidine (PEPCID PO) Take by mouth daily.        . finasteride (PROSCAR) 5 MG tablet Take 5 mg by mouth daily.        . furosemide (LASIX) 40 MG tablet TAKE ONE TABLET BY MOUTH EVERY DAY  90 tablet  1  . Multiple Vitamin (MULTI-VITAMIN PO) Take by mouth daily.        Marland Kitchen pyridoxine (B-6) 100 MG tablet Take 100 mg by mouth 2 (two) times daily.        Marland Kitchen warfarin (COUMADIN) 5 MG tablet Take 5 mg by mouth daily.          No Known Allergies  There is no problem list on file for this patient.   History  Smoking status  . Never Smoker   Smokeless tobacco  . Not on file    History  Alcohol Use No    Family History  Problem Relation Age of Onset  . Cancer Mother     Review of Systems: Constitutional: no fever chills diaphoresis or fatigue or change in weight.  Head and neck: no hearing loss, no  epistaxis, no photophobia or visual disturbance. Respiratory: No cough, shortness of breath or wheezing. Cardiovascular: No chest pain peripheral edema, palpitations. Gastrointestinal: No abdominal distention, no abdominal pain, no change in bowel habits hematochezia or melena. Genitourinary: No dysuria, no frequency, no urgency, no nocturia. Musculoskeletal:No arthralgias, no back pain, no gait disturbance or myalgias. Neurological: No dizziness, no headaches, no numbness, no seizures, no syncope, no weakness, no tremors. Hematologic: No lymphadenopathy, no easy bruising. Psychiatric: No confusion, no hallucinations, no sleep disturbance.    Physical Exam: Filed Vitals:   09/15/10 1342  BP: 100/60  Pulse: 82  The general appearance feels a well-developed large gentleman in no distress.The chest is clear to percussion and auscultation. There are no rales or rhonchi. Expansion of the chest is symmetrical.  Pupils equal and reactive.   Extraocular Movements are full.  There is no scleral icterus.  The mouth and pharynx are normal.  The neck is supple.  The carotids reveal no bruits.  The jugular venous pressure is normal.  The thyroid is not enlarged.  There is no lymphadenopathy.    The heart reveals a slightly  irregular rhythm and no murmur gallop click or rub.The abdomen is soft and nontender. Bowel sounds are normal. The liver and spleen are not enlarged. There Are no abdominal masses. There are no bruits.  The pedal pulses are good.  There is no phlebitis or edema.  There is no cyanosis or clubbing.  Strength is normal and symmetrical in all extremities.  There is no lateralizing weakness.  There are no sensory deficits.  The skin is warm and dry.  There is no rash.  EKG today shows atrial fibrillation with a paced ventricular rhythm and with occasional PVCs.   Assessment / Plan: Continue on same meds.  Recheck in 6 months

## 2010-09-16 ENCOUNTER — Encounter: Payer: Self-pay | Admitting: Cardiology

## 2010-10-10 LAB — CROSSMATCH
ABO/RH(D): A POS
Antibody Screen: NEGATIVE

## 2010-10-15 LAB — DIFFERENTIAL
Basophils Absolute: 0
Basophils Relative: 1
Lymphocytes Relative: 33
Monocytes Absolute: 0.6
Neutro Abs: 2.1
Neutrophils Relative %: 51

## 2010-10-15 LAB — PROTIME-INR
INR: 2.9 — ABNORMAL HIGH
INR: 3.1 — ABNORMAL HIGH

## 2010-10-15 LAB — BASIC METABOLIC PANEL
BUN: 37 — ABNORMAL HIGH
CO2: 29
Calcium: 9.3
Chloride: 100
Creatinine, Ser: 1.72 — ABNORMAL HIGH
Creatinine, Ser: 1.96 — ABNORMAL HIGH
GFR calc Af Amer: 47 — ABNORMAL LOW
GFR calc non Af Amer: 39 — ABNORMAL LOW
Glucose, Bld: 77
Glucose, Bld: 94
Potassium: 3.8
Sodium: 142

## 2010-10-15 LAB — APTT: aPTT: 52 — ABNORMAL HIGH

## 2010-10-15 LAB — COMPREHENSIVE METABOLIC PANEL
ALT: 30
Albumin: 4
Alkaline Phosphatase: 84
Calcium: 9.2
GFR calc Af Amer: 49 — ABNORMAL LOW
Glucose, Bld: 88
Potassium: 5.1
Sodium: 140
Total Protein: 6.9

## 2010-10-15 LAB — CBC
HCT: 28.6 — ABNORMAL LOW
Hemoglobin: 9.8 — ABNORMAL LOW
MCHC: 33.7
Platelets: 128 — ABNORMAL LOW
RDW: 18.9 — ABNORMAL HIGH
WBC: 4.1

## 2010-10-15 LAB — POCT CARDIAC MARKERS
CKMB, poc: 3.3
Myoglobin, poc: 60.9
Operator id: 151321
Troponin i, poc: <0.05

## 2010-10-15 LAB — D-DIMER, QUANTITATIVE: D-Dimer, Quant: 0.42

## 2010-10-15 LAB — TSH: TSH: 2.984

## 2010-10-15 LAB — B-NATRIURETIC PEPTIDE (CONVERTED LAB): Pro B Natriuretic peptide (BNP): 223 — ABNORMAL HIGH

## 2010-10-20 ENCOUNTER — Encounter (HOSPITAL_BASED_OUTPATIENT_CLINIC_OR_DEPARTMENT_OTHER): Payer: Medicare Other | Admitting: Hematology & Oncology

## 2010-10-20 ENCOUNTER — Other Ambulatory Visit: Payer: Self-pay | Admitting: Hematology & Oncology

## 2010-10-20 DIAGNOSIS — I4891 Unspecified atrial fibrillation: Secondary | ICD-10-CM

## 2010-10-20 DIAGNOSIS — D4620 Refractory anemia with excess of blasts, unspecified: Secondary | ICD-10-CM

## 2010-10-20 DIAGNOSIS — Z7901 Long term (current) use of anticoagulants: Secondary | ICD-10-CM

## 2010-10-20 LAB — PROTIME-INR (CHCC SATELLITE)

## 2010-10-20 LAB — RETICULOCYTES (CHCC)
ABS Retic: 26.1 10*3/uL (ref 19.0–186.0)
RBC.: 3.73 MIL/uL — ABNORMAL LOW (ref 4.22–5.81)
Retic Ct Pct: 0.7 % (ref 0.4–2.3)

## 2010-10-20 LAB — MANUAL DIFFERENTIAL (CHCC SATELLITE)
ALC: 2 10*3/uL (ref 0.9–3.3)
ANC (CHCC HP manual diff): 2.1 10*3/uL (ref 1.5–6.5)
Band Neutrophils: 1 % (ref 0–10)
LYMPH: 38 % (ref 14–48)
SEG: 40 % (ref 40–75)

## 2010-10-20 LAB — CBC WITH DIFFERENTIAL (CANCER CENTER ONLY)
HCT: 34.2 % — ABNORMAL LOW (ref 38.7–49.9)
HGB: 11.6 g/dL — ABNORMAL LOW (ref 13.0–17.1)
MCH: 32.2 pg (ref 28.0–33.4)
MCHC: 33.9 g/dL (ref 32.0–35.9)
MCV: 95 fL (ref 82–98)
RBC: 3.6 10*6/uL — ABNORMAL LOW (ref 4.20–5.70)

## 2010-10-20 LAB — FERRITIN: Ferritin: 1228 ng/mL — ABNORMAL HIGH (ref 22–322)

## 2010-10-27 ENCOUNTER — Other Ambulatory Visit: Payer: Self-pay | Admitting: Cardiology

## 2010-10-30 LAB — PROTIME-INR: Prothrombin Time: 30.1 — ABNORMAL HIGH

## 2010-11-07 ENCOUNTER — Other Ambulatory Visit: Payer: Self-pay | Admitting: Cardiology

## 2010-11-11 ENCOUNTER — Other Ambulatory Visit: Payer: Self-pay | Admitting: Dermatology

## 2010-11-17 ENCOUNTER — Encounter: Payer: Medicare Other | Admitting: Hematology & Oncology

## 2010-11-17 ENCOUNTER — Other Ambulatory Visit: Payer: Self-pay | Admitting: Hematology & Oncology

## 2010-11-17 DIAGNOSIS — D4620 Refractory anemia with excess of blasts, unspecified: Secondary | ICD-10-CM

## 2010-11-17 DIAGNOSIS — Z7901 Long term (current) use of anticoagulants: Secondary | ICD-10-CM

## 2010-11-17 LAB — PROTIME-INR (CHCC SATELLITE): Protime: 26.4 Seconds — ABNORMAL HIGH (ref 10.6–13.4)

## 2010-12-09 ENCOUNTER — Encounter: Payer: Self-pay | Admitting: Hematology & Oncology

## 2010-12-15 ENCOUNTER — Encounter (HOSPITAL_COMMUNITY): Payer: Self-pay | Admitting: Pharmacy Technician

## 2010-12-15 ENCOUNTER — Other Ambulatory Visit (HOSPITAL_BASED_OUTPATIENT_CLINIC_OR_DEPARTMENT_OTHER): Payer: Medicare Other | Admitting: Lab

## 2010-12-15 ENCOUNTER — Ambulatory Visit (HOSPITAL_BASED_OUTPATIENT_CLINIC_OR_DEPARTMENT_OTHER): Payer: Medicare Other | Admitting: Hematology & Oncology

## 2010-12-15 ENCOUNTER — Other Ambulatory Visit (HOSPITAL_COMMUNITY): Payer: Self-pay | Admitting: Orthopaedic Surgery

## 2010-12-15 ENCOUNTER — Other Ambulatory Visit: Payer: Self-pay | Admitting: Hematology & Oncology

## 2010-12-15 VITALS — BP 113/60 | HR 82 | Temp 97.1°F | Ht 73.5 in | Wt 219.0 lb

## 2010-12-15 DIAGNOSIS — I4891 Unspecified atrial fibrillation: Secondary | ICD-10-CM

## 2010-12-15 DIAGNOSIS — Z7901 Long term (current) use of anticoagulants: Secondary | ICD-10-CM

## 2010-12-15 DIAGNOSIS — D4620 Refractory anemia with excess of blasts, unspecified: Secondary | ICD-10-CM

## 2010-12-15 LAB — MANUAL DIFFERENTIAL (CHCC SATELLITE)
ALC: 1.8 10*3/uL (ref 0.9–3.3)
ANC (CHCC HP manual diff): 2.5 10*3/uL (ref 1.5–6.5)
LYMPH: 32 % (ref 14–48)
MONO: 20 % — ABNORMAL HIGH (ref 0–13)
SEG: 46 % (ref 40–75)

## 2010-12-15 LAB — CBC WITH DIFFERENTIAL (CANCER CENTER ONLY)
HCT: 34.1 % — ABNORMAL LOW (ref 38.7–49.9)
HGB: 11.6 g/dL — ABNORMAL LOW (ref 13.0–17.1)
MCH: 32 pg (ref 28.0–33.4)
MCV: 94 fL (ref 82–98)
RBC: 3.62 10*6/uL — ABNORMAL LOW (ref 4.20–5.70)
WBC: 5.5 10*3/uL (ref 4.0–10.0)

## 2010-12-15 LAB — CHCC SATELLITE - SMEAR

## 2010-12-15 LAB — IRON AND TIBC: TIBC: 210 ug/dL — ABNORMAL LOW (ref 215–435)

## 2010-12-15 NOTE — Progress Notes (Signed)
This office note has been dictated.

## 2010-12-16 NOTE — Progress Notes (Signed)
CC:   Cassell Clement, M.D.  DIAGNOSES: 1. Refractory anemia. 2. Chronic atrial fibrillation on Coumadin.  CURRENT THERAPY: 1. Aranesp 200 mcg subcu as needed for hemoglobin less than 10. 2. Coumadin, I think 5 mg p.o. daily alternating with 2.5 mg p.o.     daily.  INTERIM HISTORY:  Mr. [name] comes in for followup.  He is doing fairly well.  His wife is having more of an issue.  She is having some back problems.  He is gaining a little weight.  He did eat quite a bit over the Thanksgiving holiday.  He is trying to exercise a little bit more.  He has not had any problems with bleeding.  He has had no fevers, sweats or chills.  He has not noticed any kind of leg swelling.  Apparently his last ferritin back in October was 1228.  He last got Aranesp from [place] back in I think February 2011.  PHYSICAL EXAMINATION:  This is a well-developed, well-nourished white gentleman in no obvious distress.  Vital Signs:  temperature 97, pulse 82, respiratory rate 20, blood pressure 115/60.  Weight is 219.  Head and neck:  Shows a normocephalic, atraumatic skull.  There are no ocular or oral lesions.  There are no palpable cervical or supraclavicular lymph nodes.  Lungs:  Clear to percussion and auscultation bilaterally. Cardiac:  Regular rate and rhythm with normal S1 and S2.  There are no murmurs, rubs or bruits.  Abdomen:  Soft with good bowel sounds.  There is no palpable abdominal mass.  There is no fluid wave.  There is no palpable hepatosplenomegaly.  Back:  No tenderness over the spine, ribs, or hips.  Extremities:  Show no clubbing, cyanosis or edema. Neurologic:  Shows no focal neurological deficits.  LABORATORY STUDIES:  White cell count is 5.5, hemoglobin 11.6, hematocrit 31.6, platelet count 147.  MCV is 94.  INR is 1.6.  IMPRESSION:  Mr. [name] is an 77 year old gentleman with refractory anemia.  So far, he has done real well with this.  He has not required transfusions.  He  has not required Aranesp for close to 2 years.  His low ferritin has been on the high side but it is slowly trending down.  His INR is a little on the low side.  I am not going to make any adjustments to his Coumadin dose.  I do want to recheck his pro time in 1 month.  I will see him back myself in 2 months.    ______________________________ Josph Macho, M.D. PRE/MEDQ  D:  12/15/2010  T:  12/16/2010  Job:  [id number]

## 2010-12-19 ENCOUNTER — Other Ambulatory Visit: Payer: Self-pay

## 2010-12-19 ENCOUNTER — Encounter (HOSPITAL_COMMUNITY)
Admission: RE | Admit: 2010-12-19 | Discharge: 2010-12-19 | Disposition: A | Discharge status: Home / Self Care | Payer: Medicare Other | Source: Ambulatory Visit | Attending: Orthopaedic Surgery | Admitting: Orthopaedic Surgery

## 2010-12-19 ENCOUNTER — Encounter (HOSPITAL_COMMUNITY): Payer: Self-pay

## 2010-12-19 HISTORY — DX: Pneumonia, unspecified organism: J18.9

## 2010-12-19 HISTORY — DX: Gastro-esophageal reflux disease without esophagitis: K21.9

## 2010-12-19 HISTORY — DX: Heart failure, unspecified: I50.9

## 2010-12-19 LAB — BASIC METABOLIC PANEL
CO2: 29 mEq/L (ref 19–32)
Chloride: 104 mEq/L (ref 96–112)
GFR calc Af Amer: 41 mL/min — ABNORMAL LOW (ref >90)
Potassium: 4.2 mEq/L (ref 3.5–5.1)

## 2010-12-19 LAB — CBC
HCT: 33.7 % — ABNORMAL LOW (ref 39.0–52.0)
Hemoglobin: 11.3 g/dL — ABNORMAL LOW (ref 13.0–17.0)
MCV: 94.7 fL (ref 78.0–100.0)
RBC: 3.56 MIL/uL — ABNORMAL LOW (ref 4.22–5.81)
WBC: 5.8 10*3/uL (ref 4.0–10.5)

## 2010-12-19 LAB — SURGICAL PCR SCREEN
MRSA, PCR: NEGATIVE
Staphylococcus aureus: NEGATIVE

## 2010-12-19 LAB — PROTIME-INR: INR: 1.9 — ABNORMAL HIGH (ref 0.00–1.49)

## 2010-12-19 NOTE — Progress Notes (Signed)
Pacemaker clearance sent to [facility] dep 12/19/10 [phone]

## 2010-12-19 NOTE — Consult Note (Signed)
Anesthesia:  Patient is an 77 year old male for right shoulder arthroscopy, debridement, subacromial decompression, and distal clavicle resection on 12/23/10 by Dr. Doneen Poisson.  He is s/p a left TKA by Dr. Magnus Ivan on 06/27/10.  His past medical history includes chronic afib on Coumadin, LV dysfunction with EF 40-45% by 06/28/09 echo, PPM (in a pacemaker clinical trial at [facility]), refractory anemia and myelodysplastic syndrome followed by Dr. Myna Hidalgo, CKD, GERD, former smoker.  His primary Cardiologist is Dr. Patty Sermons who last saw him in August 2012 and follows him every 6 months.     His last echo was 06/28/09 (done at [facility]) showing mild to mod LV systolic dysfunction with apical akinesis, mild LA enlargement with afib, mild MR, mild TR with normal pulmonary artery pressure.  Dr. Patty Sermons did not feel there was any significant change from his previous echo.  His last cath was done in 2004.  I was asked to review his preoperative labs and EKG.  His EKG looks stable since at least 2010.  Labs show an elevated Cr at 1.73.  His Cr readings in June 2012 ranged from 1.53-1.82, so this appears stable.  His INR is elevated, so I have ordered f/u coagulation studies for the day of surgery.  His H/H are also stable.    Anticipate he can proceed if his f/u labs are acceptable.  His PAT nurse has faxed the perioperative implantable cardiac device form to [facility] today.

## 2010-12-19 NOTE — Pre-Procedure Instructions (Signed)
[address] THORSTEN CLIMER  12/19/2010   Your procedure is scheduled on:  12/23/10 Report to [facility] at * 945 AM.  Call this number if you have problems the morning of surgery: [phone]   Remember:   Do not eat food:After Midnight.  May have clear liquids: up to 4 Hours before arrival.  Clear liquids include soda, tea, black coffee, apple or grape juice, broth.  Take these medicines the morning of surgery with A SIP OF WATER:carvedelol,,   Do not wear jewelry, make-up or nail polish.  Do not wear lotions, powders, or perfumes. You may wear deodorant.  Do not shave 48 hours prior to surgery.  Do not bring valuables to the hospital.  Contacts, dentures or bridgework may not be worn into surgery.  Leave suitcase in the car. After surgery it may be brought to your room.  For patients admitted to the hospital, checkout time is 11:00 AM the day of discharge.   Patients discharged the day of surgery will not be allowed to drive home.  Name and phone number of your driver:[phone]  Special Instructions: CHG Shower Use Special Wash: 1/2 bottle night before surgery and 1/2 bottle morning of surgery.   Please read over the following fact sheets that you were given: Pain Booklet, Coughing and Deep Breathing, MRSA Information and Surgical Site Infection Prevention

## 2010-12-22 MED ORDER — CEFAZOLIN SODIUM-DEXTROSE 2-3 GM-% IV SOLR
2.0000 g | INTRAVENOUS | Status: AC
  Administered 2010-12-23: 2 g via INTRAVENOUS
  Filled 2010-12-22: qty 50

## 2010-12-23 ENCOUNTER — Ambulatory Visit (HOSPITAL_COMMUNITY)
Admission: RE | Admit: 2010-12-23 | Discharge: 2010-12-23 | Disposition: A | Discharge status: Home / Self Care | Payer: Medicare Other | Source: Ambulatory Visit | Attending: Orthopaedic Surgery | Admitting: Orthopaedic Surgery

## 2010-12-23 ENCOUNTER — Encounter (HOSPITAL_COMMUNITY): Payer: Self-pay | Admitting: Vascular Surgery

## 2010-12-23 ENCOUNTER — Encounter (HOSPITAL_COMMUNITY): Payer: Self-pay | Admitting: *Deleted

## 2010-12-23 ENCOUNTER — Ambulatory Visit (HOSPITAL_COMMUNITY): Payer: Medicare Other | Admitting: Vascular Surgery

## 2010-12-23 ENCOUNTER — Encounter (HOSPITAL_COMMUNITY)
Admission: RE | Disposition: A | Discharge status: Home / Self Care | Payer: Self-pay | Source: Ambulatory Visit | Attending: Orthopaedic Surgery

## 2010-12-23 DIAGNOSIS — Z950 Presence of cardiac pacemaker: Secondary | ICD-10-CM

## 2010-12-23 DIAGNOSIS — M67919 Unspecified disorder of synovium and tendon, unspecified shoulder: Secondary | ICD-10-CM | POA: Insufficient documentation

## 2010-12-23 DIAGNOSIS — M719 Bursopathy, unspecified: Secondary | ICD-10-CM | POA: Insufficient documentation

## 2010-12-23 DIAGNOSIS — I4891 Unspecified atrial fibrillation: Secondary | ICD-10-CM | POA: Insufficient documentation

## 2010-12-23 DIAGNOSIS — M75120 Complete rotator cuff tear or rupture of unspecified shoulder, not specified as traumatic: Secondary | ICD-10-CM | POA: Insufficient documentation

## 2010-12-23 DIAGNOSIS — M7540 Impingement syndrome of unspecified shoulder: Secondary | ICD-10-CM

## 2010-12-23 DIAGNOSIS — Z01810 Encounter for preprocedural cardiovascular examination: Secondary | ICD-10-CM | POA: Insufficient documentation

## 2010-12-23 DIAGNOSIS — K219 Gastro-esophageal reflux disease without esophagitis: Secondary | ICD-10-CM | POA: Insufficient documentation

## 2010-12-23 DIAGNOSIS — I509 Heart failure, unspecified: Secondary | ICD-10-CM | POA: Insufficient documentation

## 2010-12-23 DIAGNOSIS — I5022 Chronic systolic (congestive) heart failure: Secondary | ICD-10-CM

## 2010-12-23 DIAGNOSIS — M25819 Other specified joint disorders, unspecified shoulder: Secondary | ICD-10-CM | POA: Insufficient documentation

## 2010-12-23 DIAGNOSIS — Z01812 Encounter for preprocedural laboratory examination: Secondary | ICD-10-CM | POA: Insufficient documentation

## 2010-12-23 SURGERY — SHOULDER ARTHROSCOPY WITH SUBACROMIAL DECOMPRESSION
Anesthesia: General | Site: Shoulder | Laterality: Right | Wound class: Clean

## 2010-12-23 MED ORDER — NEOSTIGMINE METHYLSULFATE 1 MG/ML IJ SOLN
INTRAMUSCULAR | Status: DC | PRN
  Administered 2010-12-23: 3 mg via INTRAVENOUS

## 2010-12-23 MED ORDER — HYDROMORPHONE HCL PF 1 MG/ML IJ SOLN
INTRAMUSCULAR | Status: AC
  Filled 2010-12-23: qty 1

## 2010-12-23 MED ORDER — GLYCOPYRROLATE 0.2 MG/ML IJ SOLN
INTRAMUSCULAR | Status: DC | PRN
  Administered 2010-12-23: .4 mg via INTRAVENOUS

## 2010-12-23 MED ORDER — BUPIVACAINE-EPINEPHRINE PF 0.5-1:200000 % IJ SOLN
INTRAMUSCULAR | Status: DC | PRN
  Administered 2010-12-23: 20 mL

## 2010-12-23 MED ORDER — LACTATED RINGERS IV SOLN
INTRAVENOUS | Status: DC
  Administered 2010-12-23: 12:00:00 via INTRAVENOUS

## 2010-12-23 MED ORDER — FENTANYL CITRATE 0.05 MG/ML IJ SOLN
INTRAMUSCULAR | Status: AC
  Filled 2010-12-23: qty 2

## 2010-12-23 MED ORDER — ROCURONIUM BROMIDE 100 MG/10ML IV SOLN
INTRAVENOUS | Status: DC | PRN
  Administered 2010-12-23: 10 mg via INTRAVENOUS
  Administered 2010-12-23: 50 mg via INTRAVENOUS
  Administered 2010-12-23: 10 mg via INTRAVENOUS

## 2010-12-23 MED ORDER — FENTANYL CITRATE 0.05 MG/ML IJ SOLN
INTRAMUSCULAR | Status: DC | PRN
  Administered 2010-12-23 (×2): 50 ug via INTRAVENOUS

## 2010-12-23 MED ORDER — HYDROMORPHONE HCL PF 1 MG/ML IJ SOLN
0.2500 mg | INTRAMUSCULAR | Status: DC | PRN
  Administered 2010-12-23: 0.5 mg via INTRAVENOUS

## 2010-12-23 MED ORDER — SODIUM CHLORIDE 0.9 % IR SOLN
Status: DC | PRN
  Administered 2010-12-23: 9000 mL

## 2010-12-23 MED ORDER — OXYCODONE-ACETAMINOPHEN 5-325 MG PO TABS
1.0000 | ORAL_TABLET | ORAL | Status: AC | PRN
Start: 2010-12-23 — End: 2011-01-02

## 2010-12-23 MED ORDER — FENTANYL CITRATE 0.05 MG/ML IJ SOLN
50.0000 ug | INTRAMUSCULAR | Status: DC | PRN
  Administered 2010-12-23: 100 ug via INTRAVENOUS

## 2010-12-23 MED ORDER — ONDANSETRON HCL 4 MG/2ML IJ SOLN
INTRAMUSCULAR | Status: DC | PRN
  Administered 2010-12-23: 4 mg via INTRAVENOUS

## 2010-12-23 MED ORDER — DEXTROSE 5 % IV SOLN
10.0000 mg | INTRAVENOUS | Status: DC | PRN
  Administered 2010-12-23: 10 ug/min via INTRAVENOUS

## 2010-12-23 MED ORDER — PROPOFOL 10 MG/ML IV EMUL
INTRAVENOUS | Status: DC | PRN
  Administered 2010-12-23: 150 mg via INTRAVENOUS

## 2010-12-23 MED ORDER — ONDANSETRON HCL 4 MG/2ML IJ SOLN: 4.0000 mg | Freq: Once | INTRAMUSCULAR | Status: DC | PRN

## 2010-12-23 MED ORDER — LACTATED RINGERS IV SOLN
INTRAVENOUS | Status: DC | PRN
  Administered 2010-12-23 (×2): via INTRAVENOUS

## 2010-12-23 MED ORDER — HETASTARCH-ELECTROLYTES 6 % IV SOLN
500.0000 mL | Freq: Once | INTRAVENOUS | Status: AC
  Administered 2010-12-23: 500 mL via INTRAVENOUS

## 2010-12-23 SURGICAL SUPPLY — 56 items
BLADE CUDA 4.2 (BLADE) ×1 IMPLANT
BLADE GREAT WHITE 4.2 (BLADE) ×1 IMPLANT
BLADE SURG 11 STRL SS (BLADE) ×2 IMPLANT
BLADE SURG ROTATE 9660 (MISCELLANEOUS) IMPLANT
BUR OVAL 6.0 (BURR) ×1 IMPLANT
BUR VERTEX HOODED 4.5 (BURR) IMPLANT
CANNULA 5.75X7 CRYSTAL CLEAR (CANNULA) IMPLANT
CANNULA SHOULDER 7CM (CANNULA) ×4 IMPLANT
CANNULA TWIST IN 8.25X7CM (CANNULA) IMPLANT
CLOTH BEACON ORANGE TIMEOUT ST (SAFETY) ×2 IMPLANT
COVER SURGICAL LIGHT HANDLE (MISCELLANEOUS) ×2 IMPLANT
DECANTER SPIKE VIAL GLASS SM (MISCELLANEOUS) IMPLANT
DRAPE INCISE IOBAN 66X45 STRL (DRAPES) IMPLANT
DRAPE SHOULDER BEACH CHAIR (DRAPES) ×2 IMPLANT
DRAPE SURG 17X23 STRL (DRAPES) ×1 IMPLANT
DRAPE U-SHAPE 47X51 STRL (DRAPES) ×2 IMPLANT
DRSG PAD ABDOMINAL 8X10 ST (GAUZE/BANDAGES/DRESSINGS) ×4 IMPLANT
DURAPREP 26ML APPLICATOR (WOUND CARE) ×2 IMPLANT
ELECT REM PT RETURN 9FT ADLT (ELECTROSURGICAL)
ELECTRODE REM PT RTRN 9FT ADLT (ELECTROSURGICAL) IMPLANT
GAUZE XEROFORM 1X8 LF (GAUZE/BANDAGES/DRESSINGS) ×2 IMPLANT
GLOVE BIOGEL PI IND STRL 8 (GLOVE) ×1 IMPLANT
GLOVE BIOGEL PI INDICATOR 8 (GLOVE) ×1
GLOVE BIOGEL PI ORTHO PRO SZ7 (GLOVE) ×1
GLOVE ORTHO TXT STRL SZ7.5 (GLOVE) ×2 IMPLANT
GLOVE PI ORTHO PRO STRL SZ7 (GLOVE) IMPLANT
GOWN PREVENTION PLUS LG XLONG (DISPOSABLE) IMPLANT
GOWN PREVENTION PLUS XLARGE (GOWN DISPOSABLE) ×3 IMPLANT
GOWN STRL NON-REIN LRG LVL3 (GOWN DISPOSABLE) ×2 IMPLANT
KIT BASIN OR (CUSTOM PROCEDURE TRAY) ×2 IMPLANT
KIT ROOM TURNOVER OR (KITS) ×2 IMPLANT
KIT SHOULDER TRACTION (DRAPES) ×2 IMPLANT
MANIFOLD NEPTUNE II (INSTRUMENTS) ×2 IMPLANT
NDL 1/2 CIR CATGUT .05X1.09 (NEEDLE) IMPLANT
NDL HYPO 25GX1X1/2 BEV (NEEDLE) IMPLANT
NDL SCORPION (NEEDLE) IMPLANT
NDL SPNL 18GX3.5 QUINCKE PK (NEEDLE) ×1 IMPLANT
NEEDLE 1/2 CIR CATGUT .05X1.09 (NEEDLE) IMPLANT
NEEDLE HYPO 25GX1X1/2 BEV (NEEDLE) IMPLANT
NEEDLE SCORPION (NEEDLE) IMPLANT
NEEDLE SPNL 18GX3.5 QUINCKE PK (NEEDLE) ×2 IMPLANT
NS IRRIG 1000ML POUR BTL (IV SOLUTION) ×1 IMPLANT
PACK SHOULDER (CUSTOM PROCEDURE TRAY) ×2 IMPLANT
PAD ARMBOARD 7.5X6 YLW CONV (MISCELLANEOUS) ×4 IMPLANT
SET ARTHROSCOPY TUBING (MISCELLANEOUS) ×2
SET ARTHROSCOPY TUBING LN (MISCELLANEOUS) ×1 IMPLANT
SPONGE GAUZE 4X4 12PLY (GAUZE/BANDAGES/DRESSINGS) ×2 IMPLANT
SPONGE LAP 4X18 X RAY DECT (DISPOSABLE) ×3 IMPLANT
STAPLER VISISTAT 35W (STAPLE) IMPLANT
STRIP CLOSURE SKIN 1/2X4 (GAUZE/BANDAGES/DRESSINGS) ×1 IMPLANT
SUT ETHILON 3 0 PS 1 (SUTURE) ×1 IMPLANT
SYR CONTROL 10ML LL (SYRINGE) IMPLANT
TOWEL OR 17X24 6PK STRL BLUE (TOWEL DISPOSABLE) ×2 IMPLANT
TOWEL OR 17X26 10 PK STRL BLUE (TOWEL DISPOSABLE) ×2 IMPLANT
WAND 90 DEG TURBOVAC W/CORD (SURGICAL WAND) ×2 IMPLANT
WATER STERILE IRR 1000ML POUR (IV SOLUTION) ×2 IMPLANT

## 2010-12-23 NOTE — Transfer of Care (Signed)
Immediate Anesthesia Transfer of Care Note  Patient: [name]  Procedure(s) Performed:  SHOULDER ARTHROSCOPY WITH SUBACROMIAL DECOMPRESSION - Right shoulder arthroscopy with debridement, subacromial decompression  Patient Location: PACU  Anesthesia Type: GA combined with regional for post-op pain  Level of Consciousness: awake, alert , oriented and patient cooperative  Airway & Oxygen Therapy: Patient Spontanous Breathing and Patient connected to nasal cannula oxygen  Post-op Assessment: Report given to PACU RN, Post -op Vital signs reviewed and stable and Patient moving all extremities  Post vital signs: Reviewed and stable  Complications: No apparent anesthesia complications

## 2010-12-23 NOTE — Brief Op Note (Signed)
12/23/2010  3:40 PM  PATIENT:  [name]  77 y.o. male  PRE-OPERATIVE DIAGNOSIS:  Right shoulder impingement syndrome  POST-OPERATIVE DIAGNOSIS:  Right shoulder impingement syndrome  PROCEDURE:  Procedure(s): SHOULDER ARTHROSCOPY WITH SUBACROMIAL DECOMPRESSION EXTENSIVE DEBRIDEMENT  SURGEON:  Surgeon(s): Kathryne Hitch  PHYSICIAN ASSISTANT:   ASSISTANTS: none   ANESTHESIA:   regional and general  EBL:  Total I/O In: 1100 [I.V.:1100] Out: 50 [Blood:50]  BLOOD ADMINISTERED:none  DRAINS: none   LOCAL MEDICATIONS USED:  NONE  SPECIMEN:  No Specimen  DISPOSITION OF SPECIMEN:  N/A  COUNTS:  YES  TOURNIQUET:  * No tourniquets in log *  DICTATION: .Other Dictation: Dictation Number [id number]  PLAN OF CARE: Discharge to home after PACU  PATIENT DISPOSITION:  PACU - hemodynamically stable.   Delay start of Pharmacological VTE agent (>24hrs) due to surgical blood loss or risk of bleeding:  {YES/NO/NOT APPLICABLE:20182

## 2010-12-23 NOTE — Anesthesia Preprocedure Evaluation (Addendum)
Anesthesia Evaluation  Patient identified by MRN, date of birth, ID band Patient awake    Reviewed: Allergy & Precautions, H&P , NPO status , Patient's Chart, lab work & pertinent test results  Airway Mallampati: I TM Distance: >3 FB Neck ROM: full    Dental  (+) Teeth Intact and Caps   Pulmonary pneumonia ,          Cardiovascular +CHF + dysrhythmias Atrial Fibrillation + pacemaker + Valvular Problems/Murmurs irregular Normal    Neuro/Psych Negative Neurological ROS  Negative Psych ROS   GI/Hepatic Neg liver ROS, GERD-  ,  Endo/Other  Negative Endocrine ROS  Renal/GU negative Renal ROS  Genitourinary negative   Musculoskeletal   Abdominal   Peds  Hematology negative hematology ROS (+)   Anesthesia Other Findings   Reproductive/Obstetrics                          Anesthesia Physical Anesthesia Plan  ASA: IV  Anesthesia Plan: General   Post-op Pain Management:    Induction: Intravenous  Airway Management Planned: Oral ETT  Additional Equipment:   Intra-op Plan:   Post-operative Plan: Extubation in OR  Informed Consent: I have reviewed the patients History and Physical, chart, labs and discussed the procedure including the risks, benefits and alternatives for the proposed anesthesia with the patient or authorized representative who has indicated his/her understanding and acceptance.     Plan Discussed with: Anesthesiologist, CRNA and Surgeon  Anesthesia Plan Comments:         Anesthesia Quick Evaluation

## 2010-12-23 NOTE — Anesthesia Procedure Notes (Addendum)
Anesthesia Regional Block:  Interscalene brachial plexus block  Pre-Anesthetic Checklist: ,, timeout performed, Correct Patient, Correct Site, Correct Laterality, Correct Procedure, Correct Position, site marked, Risks and benefits discussed,  Surgical consent,  Pre-op evaluation,  At surgeon's request and post-op pain management  Laterality: Right  Prep: chloraprep       Needles:  Injection technique: Single-shot  Needle Type: Stimulator Needle - 40        Needle insertion depth: 4 cm   Additional Needles:  Procedures: nerve stimulator Interscalene brachial plexus block  Nerve Stimulator or Paresthesia:  Response: 0.5 mA, 0.1 ms, 4 cm  Additional Responses:   Narrative:  Start time: 12/23/2010 11:57 AM End time: 12/23/2010 12:02 PM Injection made incrementally with aspirations every 5 mL.  Performed by: Personally  Anesthesiologist: Maren Beach MD  Additional Notes: 20cc 0.5% Marcaine w/o difficulty or discomfort GES   Procedure Name: Intubation Date/Time: 12/23/2010 1:20 PM Performed by: Ellin Goodie Pre-anesthesia Checklist: Patient identified, Emergency Drugs available, Suction available, Patient being monitored and Timeout performed Patient Re-evaluated:Patient Re-evaluated prior to inductionOxygen Delivery Method: Circle System Utilized Preoxygenation: Pre-oxygenation with 100% oxygen Intubation Type: IV induction Ventilation: Mask ventilation without difficulty Laryngoscope Size: Mac and 3 Grade View: Grade I Tube type: Oral Tube size: 7.5 mm Number of attempts: 1 Airway Equipment and Method: stylet Placement Confirmation: ETT inserted through vocal cords under direct vision Secured at: 22 cm Tube secured with: Tape Dental Injury: Teeth and Oropharynx as per pre-operative assessment

## 2010-12-23 NOTE — H&P (Signed)
[name] is an 77 y.o. male.   Chief Complaint: right shoulder pain HPI: Known impingement syndrome right shoulder and partial-thickness rotator cuff tear.  Failed conservative treatment.  Understands risks and benefits of arthroscopic surgery.  Past Medical History  Diagnosis Date  . Bursitis     OF THE RIGHT SHOULDER  . Ringing in ears   . Easy bruising     SOME FROM HIS COUMADIN  . Arrhythmia     CHRONIC ATRIAL FIBRILLATION--LEFT VENTRICULAR DYSFUNCTION WITH AN EJECTION FRACTION OF 45-50% BY ECHOCARDIOGRAM IN MAY 2010  . Pneumonia     x3  . CHF (congestive heart failure) 10  . Chronic anemia     RECIEVES ARANESP SHOTS IF HIS HEMOGLOBIN FALLS BELOW 11  . Blood transfusion     june 12 surgery  . GERD (gastroesophageal reflux disease)   . Chronic kidney disease     stones 48    Past Surgical History  Procedure Date  . Knee surgery     right Dec.2011,left June 2012  . Insert / replace / remove pacemaker     dr Patty Sermons  lat visit  summer 12  . Joint replacement     bil knee replacement 12lft 11 lft  . Hernia repair   . Pilonidal cyst excision 72?  Marland Kitchen Kidney stone surgery     Family History  Problem Relation Age of Onset  . Cancer Mother    Social History:  reports that he has quit smoking. He does not have any smokeless tobacco history on file. He reports that he does not drink alcohol or use illicit drugs.  Allergies:  Allergies  Allergen Reactions  . Moxifloxacin Hcl In Nacl Other (See Comments)    Pt c/o being "out of his head" & itching    Medications Prior to Admission  Medication Dose Route Frequency Provider Last Rate Last Dose  . ceFAZolin (ANCEF) IVPB 2 g/50 mL premix  2 g Intravenous 60 min Pre-Op Kathryne Hitch      . fentaNYL (SUBLIMAZE) injection 50-100 mcg  50-100 mcg Intravenous PRN Rivka Barbara, MD   100 mcg at 12/23/10 1159  . lactated ringers infusion   Intravenous Continuous Rivka Barbara, MD 50 mL/hr at 12/23/10  1148     Medications Prior to Admission  Medication Sig Dispense Refill  . alfuzosin (UROXATRAL) 10 MG 24 hr tablet Take 10 mg by mouth daily.        . carvedilol (COREG) 6.25 MG tablet TAKE ONE TABLET BY MOUTH TWICE DAILY  180 tablet  3  . finasteride (PROSCAR) 5 MG tablet Take 5 mg by mouth daily.        . furosemide (LASIX) 40 MG tablet TAKE ONE TABLET BY MOUTH EVERY DAY  90 tablet  3  . Multiple Vitamin (MULTI-VITAMIN PO) Take by mouth daily.        Marland Kitchen pyridoxine (B-6) 100 MG tablet Take 100 mg by mouth 2 (two) times daily.        Marland Kitchen warfarin (COUMADIN) 5 MG tablet Take 5 mg by mouth daily.         Results for orders placed during the hospital encounter of 12/23/10 (from the past 48 hour(s))  APTT     Status: Normal   Collection Time   12/23/10  9:40 AM      Component Value Range Comment   aPTT 35  24 - 37 (seconds)   PROTIME-INR     Status: Abnormal  Collection Time   12/23/10  9:40 AM      Component Value Range Comment   Prothrombin Time 15.6 (*) 11.6 - 15.2 (seconds)    INR 1.21  0.00 - 1.49     No results found.  Review of Systems  Constitutional: Negative.   HENT: Negative.   Eyes: Negative.   Respiratory: Negative.   Cardiovascular: Negative.   Gastrointestinal: Negative.   Genitourinary: Negative.   Musculoskeletal: Positive for joint pain.  Skin: Negative.   Neurological: Negative.   Endo/Heme/Allergies: Negative.   Psychiatric/Behavioral: Negative.   All other systems reviewed and are negative.    Blood pressure 124/76, pulse 80, temperature 98.1 F (36.7 C), temperature source Oral, resp. rate 17, SpO2 100.00%. Physical Exam  Constitutional: He is oriented to person, place, and time. He appears well-developed and well-nourished.  HENT:  Head: Normocephalic and atraumatic.  Eyes: Conjunctivae and EOM are normal. Pupils are equal, round, and reactive to light.  Neck: Normal range of motion. Neck supple.  Cardiovascular: Normal rate and regular rhythm.     Respiratory: Effort normal and breath sounds normal.  GI: Soft. Bowel sounds are normal.  Musculoskeletal:       Right shoulder: He exhibits decreased range of motion, tenderness and crepitus.  Neurological: He is alert and oriented to person, place, and time.  Skin: Skin is warm.  Psychiatric: He has a normal mood and affect.     Assessment/Plan To OR today for right shoulder arthroscopy.  Sinjin Amero Y 12/23/2010, 1:18 PM

## 2010-12-23 NOTE — Progress Notes (Signed)
Leta Jungling with [employer] notified of Pacemaker and todays surgery.

## 2010-12-23 NOTE — Anesthesia Postprocedure Evaluation (Signed)
  Anesthesia Post-op Note  Patient: [name]  Procedure(s) Performed:  SHOULDER ARTHROSCOPY WITH SUBACROMIAL DECOMPRESSION - Right shoulder arthroscopy with debridement, subacromial decompression  Patient Location: PACU  Anesthesia Type: GA combined with regional for post-op pain  Level of Consciousness: awake, alert , oriented and patient cooperative  Airway and Oxygen Therapy: Patient Spontanous Breathing and Patient connected to nasal cannula oxygen  Post-op Pain: mild  Post-op Assessment: Post-op Vital signs reviewed, Patient's Cardiovascular Status Stable, Respiratory Function Stable, Patent Airway, No signs of Nausea or vomiting and Pain level controlled  Post-op Vital Signs: stable  Complications: No apparent anesthesia complications

## 2010-12-24 NOTE — Op Note (Signed)
[name], [name]  MEDICAL RECORD NO.:  [id number]  LOCATION:  [facility]                         FACILITY:  [facility]  PHYSICIAN:  Vanita Panda. Magnus Ivan, M.D.DATE OF BIRTH:  [birth year]  DATE OF PROCEDURE:  12/23/2010 DATE OF DISCHARGE:  12/23/2010                              OPERATIVE REPORT   PREOPERATIVE DIAGNOSES: 1. Right shoulder impingement syndrome. 2. Partial thickness rotator cuff tear.  POSTOPERATIVE DIAGNOSES: 1. Right shoulder impingement syndrome with severe bursitis and     inflamed tissue. 2. Full-thickness rotator cuff tear, right shoulder.  PROCEDURE:  Right shoulder arthroscopy with extensive debridement, subacromial decompression.  SURGEON:  Vanita Panda. Magnus Ivan, MD  ANESTHESIA: 1. Right regional shoulder block 2. General.  ESTIMATED BLOOD LOSS:  Minimal.  COMPLICATIONS:  None.  INDICATIONS:  This is a nice 77 year old gentleman well known to me.  He has had right shoulder problems for a long period of time known to have injected on multiple occasions.  He has had decreased range of motion and weakness at this point, wish to proceed with arthroscopic intervention to at least clean the shoulder.  He understands that given his age and condition of tissues, we may not be able to perform a rotator cuff repair, but is willing to first try and he is willing first to debride the shoulder significantly.  PROCEDURE DESCRIPTION:  After informed consent was obtained, appropriate right shoulder was marked.  Anesthesia obtained regional shoulder block. He was then brought to the operative placed supine on the operative table.  General anesthesia was then obtained.  He was then fashioned in a beach chair position with positioning of the head and neck and padding of the down on operative left arm.  His right arm was then placed and an in-line skeletal traction with 10 pounds of traction and 45 degrees for forward flexion and  neutral rotation.  A time-out was called and he was identified as a correct patient, correct right shoulder, then it was prepped and draped with DuraPrep and sterile drapes.  Posterior arthroscopy portal was then made, off the poster lateral edge of the acromion.  The glenohumeral joint was encountered.  Right away, I could see there was a full thickness rotator cuff tear and significant inflamed tissue with scar tissue all anteriorly.  An soft tissue ablation wand was placed anteriorly in the rotator interval and significant extensor debridement was carried out with a soft tissue ablation wand and an arthroscopic shaver.  Next, I entered a subacromial space to the posterior portal and made a separate lateral portal.  I was able to use a high-speed bur to debride bone spurs underneath the acromion and the clavicle as well as performed a partial acromioplasty anteriorly.  I used a soft tissue ablation wand to clean out a significant bursitis and used arthroscopic shaver to mobilize the rotator cuff.  I did attempt to try to bring the rotator cuff back over her resting position but it just tore apart due to decreased collagen fibers.  Once I cleaned out in the subdeltoid bursa, I removed all instrumentation and I closed the portal sites with interrupted nylon suture.  Xeroform followed well-padded sterile dressing  and a sling was placed.  The patient was awakened, extubated, and taken to recovery room in stable condition.  All final counts were correct.  There were no complications noted.     Vanita Panda. Magnus Ivan, M.D.     CYB/MEDQ  D:  12/23/2010  T:  12/24/2010  Job:  [id number]

## 2011-01-15 ENCOUNTER — Other Ambulatory Visit: Payer: Self-pay | Admitting: Hematology & Oncology

## 2011-01-15 ENCOUNTER — Other Ambulatory Visit: Payer: Self-pay | Admitting: *Deleted

## 2011-01-15 ENCOUNTER — Other Ambulatory Visit (HOSPITAL_BASED_OUTPATIENT_CLINIC_OR_DEPARTMENT_OTHER): Payer: Medicare Other | Admitting: Lab

## 2011-01-15 DIAGNOSIS — I4891 Unspecified atrial fibrillation: Secondary | ICD-10-CM

## 2011-01-15 DIAGNOSIS — Z7901 Long term (current) use of anticoagulants: Secondary | ICD-10-CM

## 2011-01-15 LAB — PROTIME-INR (CHCC SATELLITE): Protime: 39.6 Seconds — ABNORMAL HIGH (ref 10.6–13.4)

## 2011-01-15 MED ORDER — WARFARIN SODIUM 5 MG PO TABS: 5.0000 mg | ORAL_TABLET | Freq: Every day | ORAL | Status: DC

## 2011-01-15 NOTE — Telephone Encounter (Signed)
Pt came in for INR check and told Alvino Chapel he needed a Coumadin refill. Sent via eprescribe to [facility].

## 2011-02-06 ENCOUNTER — Telehealth: Payer: Self-pay | Admitting: Cardiology

## 2011-02-06 NOTE — Telephone Encounter (Signed)
Discussed pacemaker checks with patient.  He gets them done at [facility].  Has appointment there Monday and discuss with them

## 2011-02-06 NOTE — Telephone Encounter (Signed)
New Problem   Patient would like a return call to discuss medical care.  He can be reached at hm#

## 2011-02-10 ENCOUNTER — Encounter: Payer: Self-pay | Admitting: Hematology & Oncology

## 2011-02-10 ENCOUNTER — Other Ambulatory Visit (HOSPITAL_BASED_OUTPATIENT_CLINIC_OR_DEPARTMENT_OTHER): Payer: Medicare Other | Admitting: Lab

## 2011-02-10 ENCOUNTER — Ambulatory Visit (HOSPITAL_BASED_OUTPATIENT_CLINIC_OR_DEPARTMENT_OTHER): Payer: Medicare Other | Admitting: Hematology & Oncology

## 2011-02-10 DIAGNOSIS — D46Z Other myelodysplastic syndromes: Secondary | ICD-10-CM

## 2011-02-10 DIAGNOSIS — D4620 Refractory anemia with excess of blasts, unspecified: Secondary | ICD-10-CM

## 2011-02-10 DIAGNOSIS — I4891 Unspecified atrial fibrillation: Secondary | ICD-10-CM

## 2011-02-10 HISTORY — DX: Other myelodysplastic syndromes: D46.Z

## 2011-02-10 HISTORY — DX: Hemochromatosis due to repeated red blood cell transfusions: E83.111

## 2011-02-10 HISTORY — DX: Refractory anemia with excess of blasts, unspecified: D46.20

## 2011-02-10 LAB — CBC WITH DIFFERENTIAL (CANCER CENTER ONLY)
MCH: 32.1 pg (ref 28.0–33.4)
MCV: 97 fL (ref 82–98)
Platelets: 147 10*3/uL (ref 145–400)
RBC: 3.61 10*6/uL — ABNORMAL LOW (ref 4.20–5.70)

## 2011-02-10 LAB — RETICULOCYTES (CHCC)
ABS Retic: 33.2 10*3/uL (ref 19.0–186.0)
RBC.: 3.69 MIL/uL — ABNORMAL LOW (ref 4.22–5.81)

## 2011-02-10 LAB — MANUAL DIFFERENTIAL (CHCC SATELLITE)
ALC: 1.8 10*3/uL (ref 0.9–3.3)
ANC (CHCC HP manual diff): 3 10*3/uL (ref 1.5–6.5)
LYMPH: 33 % (ref 14–48)
MONO: 10 % (ref 0–13)
PLT EST ~~LOC~~: ADEQUATE
SEG: 56 % (ref 40–75)

## 2011-02-10 LAB — FERRITIN: Ferritin: 1442 ng/mL — ABNORMAL HIGH (ref 22–322)

## 2011-02-10 NOTE — Progress Notes (Signed)
This office note has been dictated.

## 2011-02-10 NOTE — Progress Notes (Signed)
CC:   Cassell Clement, M.D.  DIAGNOSES: 1. Myelodysplastic syndrome, low grade. 2. Chronic atrial fibrillation.  CURRENT THERAPY: 1. Aranesp 200 mcg subcu as needed for hemoglobin less than 10. 2. Coumadin to maintain INR of around 2.  INTERIM HISTORY:  Mr. Sessums comes in for followup.  He is doing okay. He had surgery, I think for his right shoulder.  This was arthroscopic surgery.  He got through this without any problems.  He had no issues of bleeding.  He has had no problems with his atrial fibrillation.  He has been on Coumadin.  His Coumadin level has really been nice and stable.  We really have not had to adjust his Coumadin dose.  When we last saw him back in November, his ferritin was 1456.  His iron saturation was 72%.  He has had no fever.  He has had no cough or shortness of breath.  He has had no bony pain.  His, I think, right knee does bother him a little bit.  He actually was out doing some gardening yesterday.  He is able to do this without a lot of discomfort.  PHYSICAL EXAMINATION:  General:  This is a well-developed, well- nourished white gentleman in no obvious distress.  Vital Signs: Temperature 97.3, pulse 86, respiratory rate 14, blood pressure 119/66, weight is 218.  Head and Neck Exam:  Shows a normocephalic, atraumatic skull.  There are no ocular or oral lesions.  There are no palpable cervical or supraclavicular lymph nodes.  Lungs:  Clear bilaterally. Cardiac Exam:  Regular rate and rhythm with occasional extra beat.  He has no murmurs, rubs, or bruits.  Abdominal Exam:  Soft with good bowel sounds.  There is no palpable abdominal mass.  There is no fluid wave. There is no palpable hepatosplenomegaly.  Back Exam:  No tenderness over the spine, ribs, or hips.  Extremities:  Show no clubbing, cyanosis, or edema.  Neurological Exam:  Shows no focal neurological deficits.  LABORATORY STUDIES:  White cell count 5.4, hemoglobin 11.6,  hematocrit 34.9, platelet count 147.  MCV is 97.  INR is 1.8.  IMPRESSION:  Mr. Scurlock is an 78 year old gentleman with a low-grade myelodysplastic syndrome.  He has refractory anemia.  He has done very well with this.  We have not had to transfuse him for a long time.  We have not had to give him Aranesp probably for over a year.  I did talk to Mr. Tierce regarding the new anticoagulant, Xarelto.  I told that if he wanted to switch over to Xarelto that I do not see a problem with this.  This may be more convenient for him.  In reality, though, he has really not had any problems with Coumadin.  We have not had to adjust his Coumadin dose for several months.  We will plan to get Mr. Tiger back in another 6 weeks or so.  I do not see that we need any blood work in between visits.    ______________________________ Josph Macho, M.D. PRE/MEDQ  D:  02/10/2011  T:  02/10/2011  Job:  [id number]  ADDENDUM:  Ferritin is 1442.

## 2011-03-17 ENCOUNTER — Ambulatory Visit (INDEPENDENT_AMBULATORY_CARE_PROVIDER_SITE_OTHER): Payer: Medicare Other | Admitting: Cardiology

## 2011-03-17 ENCOUNTER — Encounter: Payer: Self-pay | Admitting: Cardiology

## 2011-03-17 VITALS — BP 110/64 | HR 56 | Ht 75.0 in | Wt 219.0 lb

## 2011-03-17 DIAGNOSIS — I5022 Chronic systolic (congestive) heart failure: Secondary | ICD-10-CM

## 2011-03-17 DIAGNOSIS — Z950 Presence of cardiac pacemaker: Secondary | ICD-10-CM

## 2011-03-17 DIAGNOSIS — I509 Heart failure, unspecified: Secondary | ICD-10-CM

## 2011-03-17 DIAGNOSIS — I4891 Unspecified atrial fibrillation: Secondary | ICD-10-CM

## 2011-03-17 DIAGNOSIS — D4620 Refractory anemia with excess of blasts, unspecified: Secondary | ICD-10-CM

## 2011-03-17 NOTE — Assessment & Plan Note (Signed)
The patient is followed for her low-grade myelodysplastic syndrome by Dr. Myna Hidalgo.

## 2011-03-17 NOTE — Assessment & Plan Note (Signed)
The patient has had no new symptoms referable to his chronic systolic congestive heart failure.  He denies any chest pain or shortness of breath.  He is having no dizziness.  Despite having a chronically low hemoglobin in the range of 11.6

## 2011-03-17 NOTE — Progress Notes (Signed)
[name] Date of Birth:  21-Dec-1927 [facility] [address] [place], [place]  [address] [phone]         Fax   [phone]  History of Present Illness: This pleasant 78 year old gentleman is seen for a scheduled followup office visit.  The patient has a remote history of established atrial fibrillation.  He had an ablation of his AV node with implantation of a biventricular pacemaker on 12/28/06 by Dr. Luella Cook at [facility].  He was hospitalized in May of 2009 for congestive heart failure.  We first saw the patient in March of 2010.  He has been on chronic Coumadin for his atrial fibrillation.  Up until recently he had been going to [facility] on a regular basis for a clinical research study involving his pacemaker.  The research study has now ended and he expresses an interest of moving his pacemaker care to [facility] cardiology.  Current Outpatient Prescriptions  Medication Sig Dispense Refill  . alfuzosin (UROXATRAL) 10 MG 24 hr tablet Take 10 mg by mouth daily.        . carvedilol (COREG) 6.25 MG tablet TAKE ONE TABLET BY MOUTH TWICE DAILY  180 tablet  3  . famotidine (PEPCID) 20 MG tablet Take 20 mg by mouth 2 (two) times daily.        . finasteride (PROSCAR) 5 MG tablet Take 5 mg by mouth daily.        . furosemide (LASIX) 40 MG tablet TAKE ONE TABLET BY MOUTH EVERY DAY  90 tablet  3  . Multiple Vitamin (MULTI-VITAMIN PO) Take by mouth daily.        Marland Kitchen pyridoxine (B-6) 100 MG tablet Take 100 mg by mouth 2 (two) times daily.        Marland Kitchen warfarin (COUMADIN) 5 MG tablet Take 1 tablet (5 mg total) by mouth daily.  30 tablet  2    Allergies  Allergen Reactions  . Moxifloxacin Hcl In Nacl Other (See Comments)    Pt c/o being "out of his head" & itching    Patient Active Problem List  Diagnoses  . Atrial fibrillation with controlled ventricular response  . Chronic systolic congestive heart failure  . Pacemaker  . Shoulder impingement  syndrome  . MDS (myelodysplastic syndrome), low grade  . Iron overload, transfusional    History  Smoking status  . Former Smoker  Smokeless tobacco  . Not on file    History  Alcohol Use No    Family History  Problem Relation Age of Onset  . Cancer Mother     Review of Systems: Constitutional: no fever chills diaphoresis or fatigue or change in weight.  Head and neck: no hearing loss, no epistaxis, no photophobia or visual disturbance. Respiratory: No cough, shortness of breath or wheezing. Cardiovascular: No chest pain peripheral edema, palpitations. Gastrointestinal: No abdominal distention, no abdominal pain, no change in bowel habits hematochezia or melena. Genitourinary: No dysuria, no frequency, no urgency, no nocturia. Musculoskeletal:No arthralgias, no back pain, no gait disturbance or myalgias. Neurological: No dizziness, no headaches, no numbness, no seizures, no syncope, no weakness, no tremors. Hematologic: No lymphadenopathy, no easy bruising. Psychiatric: No confusion, no hallucinations, no sleep disturbance.    Physical Exam: Filed Vitals:   03/17/11 1513  BP: 110/64  Pulse: 56   the general appearance reveals a well-developed well-nourished gentleman in no distress.Pupils equal and reactive.   Extraocular Movements are full.  There is no scleral icterus.  The  mouth and pharynx are normal.  The neck is supple.  The carotids reveal no bruits.  The jugular venous pressure is normal.  The thyroid is not enlarged.  There is no lymphadenopathy.  The chest is clear to percussion and auscultation. There are no rales or rhonchi. Expansion of the chest is symmetrical.  The precordium is quiet.  The first heart sound is normal.  The second heart sound is physiologically split.  There is no murmur gallop rub or click.  There is no abnormal lift or heave.  The abdomen is soft and nontender. Bowel sounds are normal. The liver and spleen are not enlarged. There Are no  abdominal masses. There are no bruits.  The pedal pulses are good.  There is no phlebitis or edema.  There is no cyanosis or clubbing. Strength is normal and symmetrical in all extremities.  There is no lateralizing weakness.  There are no sensory deficits.     Assessment / Plan: Continue same medication.  His Coumadin is followed at the present time by his hematologist.  We will plan to get him an appointment with our pacemaker clinic.  I will see him for a followup office visit and EKG in 6 months.

## 2011-03-17 NOTE — Assessment & Plan Note (Signed)
The patient has a [employer]onic biventricular pacemaker which was implanted in 2008.  The patient will be transferring his pacemaker followup care to our office.

## 2011-03-17 NOTE — Patient Instructions (Addendum)
Will arrange for you to get into pacemaker clinic at this office, will call you with appointment Your physician recommends that you continue on your current medications as directed. Please refer to the Current Medication list given to you today. Your physician wants you to follow-up in: 6 months You will receive a reminder letter in the mail two months in advance. If you don't receive a letter, please call our office to schedule the follow-up appointment.

## 2011-03-27 ENCOUNTER — Telehealth: Payer: Self-pay | Admitting: Cardiology

## 2011-03-27 NOTE — Telephone Encounter (Signed)
New msg Pt was calling about receiving records from [facility] about his pacemaker. Please call him back

## 2011-03-31 ENCOUNTER — Ambulatory Visit: Payer: Medicare Other | Admitting: Hematology & Oncology

## 2011-03-31 ENCOUNTER — Other Ambulatory Visit (HOSPITAL_BASED_OUTPATIENT_CLINIC_OR_DEPARTMENT_OTHER): Payer: Medicare Other | Admitting: Lab

## 2011-03-31 ENCOUNTER — Other Ambulatory Visit: Payer: Medicare Other | Admitting: Lab

## 2011-03-31 ENCOUNTER — Ambulatory Visit (HOSPITAL_BASED_OUTPATIENT_CLINIC_OR_DEPARTMENT_OTHER): Payer: Medicare Other | Admitting: Hematology & Oncology

## 2011-03-31 DIAGNOSIS — I4891 Unspecified atrial fibrillation: Secondary | ICD-10-CM

## 2011-03-31 DIAGNOSIS — D4620 Refractory anemia with excess of blasts, unspecified: Secondary | ICD-10-CM

## 2011-03-31 DIAGNOSIS — D464 Refractory anemia, unspecified: Secondary | ICD-10-CM

## 2011-03-31 LAB — MANUAL DIFFERENTIAL (CHCC SATELLITE)
ALC: 2.3 10*3/uL (ref 0.9–3.3)
Band Neutrophils: 2 % (ref 0–10)
Eos: 2 % (ref 0–7)
MONO: 12 % (ref 0–13)
Platelet Morphology: NORMAL
SEG: 44 % (ref 40–75)

## 2011-03-31 LAB — RETICULOCYTES (CHCC)
RBC.: 3.65 MIL/uL — ABNORMAL LOW (ref 4.22–5.81)
Retic Ct Pct: 0.7 % (ref 0.4–2.3)

## 2011-03-31 LAB — PROTIME-INR (CHCC SATELLITE): INR: 2.5 (ref 2.0–3.5)

## 2011-03-31 LAB — FERRITIN: Ferritin: 1361 ng/mL — ABNORMAL HIGH (ref 22–322)

## 2011-03-31 LAB — CBC WITH DIFFERENTIAL (CANCER CENTER ONLY)
Platelets: 122 10*3/uL — ABNORMAL LOW (ref 145–400)
RBC: 3.6 10*6/uL — ABNORMAL LOW (ref 4.20–5.70)
WBC: 6 10*3/uL (ref 4.0–10.0)

## 2011-03-31 NOTE — Progress Notes (Signed)
This office note has been dictated.6

## 2011-03-31 NOTE — Progress Notes (Signed)
CC:   [name], M.D.  DIAGNOSES: 1. Refractory anemia, low-grade. 2. Chronic atrial fibrillation on Coumadin.  CURRENT THERAPY: 1. Aranesp 300 mcg subcu as needed for hemoglobin less than 10. 2. Coumadin to maintain INR between 2-3.  INTERIM HISTORY:  [name] comes in for his followup.  We see him every 2-3 months now.  He is feeling okay.  His right shoulder is improving.  He had arthroscopic surgery on that shoulder.  He has had no problem bleeding wise.  He has been doing well on his Coumadin.  He has not noticed any problems with change in bowel or bladder habits. He has had no leg swelling.  We are watching his iron.  His last ferritin back in January was 1442 which actually is holding stable.  PHYSICAL EXAMINATION:  General:  This is a well-developed, well- nourished white gentleman in no obvious distress.  Vital signs:  97.1, pulse 90, respiratory rate 18, blood pressure 106/67.  Weight is 222. Head and neck:  Exam shows a normocephalic, atraumatic skull.  There are no ocular or oral lesions.  There are no palpable cervical, supraclavicular lymph nodes.  Lungs:  Clear bilaterally.  Cardiac: Regular rate and rhythm with normal S1, S2.  There are no murmurs, rubs or bruits.  Abdomen:  Soft with good bowel sounds.  There is no palpable abdominal mass.  There is no fluid wave.  No palpable hepatosplenomegaly.  Extremities:  Showed no clubbing, cyanosis or edema.  Neurological:  Exam shows no focal neurological deficits.  LABORATORY STUDIES:  White cell count is 16, hemoglobin 11.7, hematocrit 35.2, platelet count 122.  INR is 2.5.  IMPRESSION:  [name] is an 78 year old gentleman who is in quite vigorous physical shape.  He has myelodysplastic syndrome.  He basically has refractory anemia.  We have not had to give Aranesp now for I think 2 years or more.  His iron is still on the high side, but again some of this might be an acute phase reactant.  We will  go ahead and plan for [name] to come back to see [place] in another 2 months.   ______________________________ [name], M.D. PRE/MEDQ  D:  03/31/2011  T:  03/31/2011  Job:  [id number]

## 2011-04-01 NOTE — Telephone Encounter (Signed)
Spoke with Baxter Hire and she has not received any information.  Advised patient to call [facility] himself and request information be sent here.  Advised patient would let him know when information received.

## 2011-04-13 ENCOUNTER — Telehealth: Payer: Self-pay | Admitting: Cardiology

## 2011-04-13 NOTE — Telephone Encounter (Signed)
ROI Mailed to pt Home address 04/13/11/KM

## 2011-04-22 NOTE — Telephone Encounter (Signed)
ROI received back, faxed to [facility] @ [phone] 04/22/11/KM

## 2011-04-24 ENCOUNTER — Other Ambulatory Visit: Payer: Self-pay | Admitting: *Deleted

## 2011-04-24 DIAGNOSIS — I4891 Unspecified atrial fibrillation: Secondary | ICD-10-CM

## 2011-04-24 MED ORDER — WARFARIN SODIUM 5 MG PO TABS: 5.0000 mg | ORAL_TABLET | Freq: Every day | ORAL | Status: DC

## 2011-04-24 NOTE — Telephone Encounter (Signed)
Received refill request for warfarin 5 mg from [facility]. This is a chronic med for the pt. He last saw Dr Myna Hidalgo in March with no change in dosing. Sent RF via eprescribe.

## 2011-04-27 NOTE — Telephone Encounter (Signed)
Records Received back Form [facility] gave to [facility] 04/27/11/KM

## 2011-04-30 NOTE — Telephone Encounter (Signed)
Information given to [facility]

## 2011-04-30 NOTE — Telephone Encounter (Signed)
Left patient message.  We did receive information on device and was going to schedule a device check

## 2011-05-26 ENCOUNTER — Ambulatory Visit (HOSPITAL_BASED_OUTPATIENT_CLINIC_OR_DEPARTMENT_OTHER): Payer: Medicare Other | Admitting: Hematology & Oncology

## 2011-05-26 ENCOUNTER — Other Ambulatory Visit (HOSPITAL_BASED_OUTPATIENT_CLINIC_OR_DEPARTMENT_OTHER): Payer: Medicare Other | Admitting: Lab

## 2011-05-26 VITALS — BP 93/58 | HR 88 | Temp 97.4°F | Ht 73.0 in | Wt 219.0 lb

## 2011-05-26 DIAGNOSIS — D4620 Refractory anemia with excess of blasts, unspecified: Secondary | ICD-10-CM

## 2011-05-26 DIAGNOSIS — I4891 Unspecified atrial fibrillation: Secondary | ICD-10-CM

## 2011-05-26 DIAGNOSIS — D464 Refractory anemia, unspecified: Secondary | ICD-10-CM

## 2011-05-26 LAB — PROTIME-INR (CHCC SATELLITE)
INR: 2.6 (ref 2.0–3.5)
Protime: 31.2 Seconds — ABNORMAL HIGH (ref 10.6–13.4)

## 2011-05-26 LAB — MANUAL DIFFERENTIAL (CHCC SATELLITE)
ALC: 2.5 10*3/uL (ref 0.9–3.3)
Eos: 3 % (ref 0–7)
MONO: 16 % — ABNORMAL HIGH (ref 0–13)
SEG: 37 % — ABNORMAL LOW (ref 40–75)
nRBC: 1 % — ABNORMAL HIGH (ref 0–0)

## 2011-05-26 LAB — CBC WITH DIFFERENTIAL (CANCER CENTER ONLY)
MCH: 32.1 pg (ref 28.0–33.4)
Platelets: 138 10*3/uL — ABNORMAL LOW (ref 145–400)
RBC: 3.55 10*6/uL — ABNORMAL LOW (ref 4.20–5.70)
RDW: 13.3 % (ref 11.1–15.7)
WBC: 5.7 10*3/uL (ref 4.0–10.0)

## 2011-05-26 NOTE — Progress Notes (Signed)
This office note has been dictated.

## 2011-05-26 NOTE — Progress Notes (Signed)
CC:   [name], M.D.  DIAGNOSES: 1. Refractory anemia, current. 2. Chronic atrial fibrillation.  CURRENT THERAPY: 1. Aranesp 300 mcg subcu as needed for hemoglobin less than 10. 2. Long-term Coumadin.  INTERIM HISTORY:  [name] comes in for his followup.  He has done well.  He is not complaining too much of the right shoulder.  He had surgery for this last year.  He has had no problems with bleeding.  He has had no heart issues.  He has had no increase in fatigue or weakness.  We are watching his ferritin.  His last ferritin back in March was 1361. His iron saturation was 78%.  He has not required Aranesp probably for over 6 months.  We have not had to transfuse him for a couple years.  PHYSICAL EXAMINATION:  General:  This is a well-developed, well- nourished white gentleman in no obvious distress.  Vital signs:  97.4, pulse 88, respiratory rate 18, blood pressure 93/58.  Weight is 219. Head and neck:  Exam shows a normocephalic, atraumatic skull.  There are no ocular or oral lesions.  There are no palpable cervical or supraclavicular lymph nodes.  Lungs:  Clear bilaterally.  Cardiac: Regular rate and rhythm with a normal S1, S2.  There are no murmurs, rubs or bruits.  Abdomen:  Soft with good bowel sounds.  There is no palpable abdominal mass.  There is no fluid wave.  There is no palpable hepatosplenomegaly.  Back:  No tenderness over the spine, ribs or hips. Extremities:  Shows no clubbing, cyanosis or edema.  Skin:  No rashes, ecchymoses or petechiae.  LABORATORY STUDIES:  White cell count is 5.7, hemoglobin 11.4, hematocrit 34.9, platelet count 138.  MCV is 98.  IMPRESSION:  [name] is an 78 year old gentleman with myelodysplastic syndrome.  He has refractory anemia.  This clearly has been low-grade. We really have not had to worry about this transforming.  We have not had to treat him probably for about 2 or 3 years.  There is no indication for Aranesp  right now.  We will plan to get him back in another couple of months for followup. I do not think we need to worry about his Coumadin right now.  His INR was 2.6.    ______________________________ Josph Macho, M.D. PRE/MEDQ  D:  05/26/2011  T:  05/26/2011  Job:  [id number]

## 2011-05-27 LAB — FERRITIN: Ferritin: 1352 ng/mL — ABNORMAL HIGH (ref 22–322)

## 2011-05-27 LAB — IRON AND TIBC: TIBC: 193 ug/dL — ABNORMAL LOW (ref 215–435)

## 2011-07-27 ENCOUNTER — Other Ambulatory Visit (HOSPITAL_BASED_OUTPATIENT_CLINIC_OR_DEPARTMENT_OTHER): Payer: Medicare Other | Admitting: Lab

## 2011-07-27 ENCOUNTER — Ambulatory Visit (HOSPITAL_BASED_OUTPATIENT_CLINIC_OR_DEPARTMENT_OTHER): Payer: Medicare Other | Admitting: Hematology & Oncology

## 2011-07-27 VITALS — BP 97/59 | HR 89 | Temp 97.6°F | Ht 73.0 in | Wt 219.0 lb

## 2011-07-27 DIAGNOSIS — I4891 Unspecified atrial fibrillation: Secondary | ICD-10-CM

## 2011-07-27 DIAGNOSIS — D4620 Refractory anemia with excess of blasts, unspecified: Secondary | ICD-10-CM

## 2011-07-27 LAB — CBC WITH DIFFERENTIAL (CANCER CENTER ONLY)
HGB: 11.3 g/dL — ABNORMAL LOW (ref 13.0–17.1)
MCH: 32.5 pg (ref 28.0–33.4)
Platelets: 123 10*3/uL — ABNORMAL LOW (ref 145–400)
RBC: 3.48 10*6/uL — ABNORMAL LOW (ref 4.20–5.70)
WBC: 5 10*3/uL (ref 4.0–10.0)

## 2011-07-27 LAB — RETICULOCYTES (CHCC): Retic Ct Pct: 0.5 % (ref 0.4–2.3)

## 2011-07-27 LAB — PROTIME-INR (CHCC SATELLITE)
INR: 2.5 (ref 2.0–3.5)
Protime: 30 Seconds — ABNORMAL HIGH (ref 10.6–13.4)

## 2011-07-27 LAB — MANUAL DIFFERENTIAL (CHCC SATELLITE)
ALC: 1.9 10*3/uL (ref 0.9–3.3)
ANC (CHCC HP manual diff): 2.1 10*3/uL (ref 1.5–6.5)
Platelet Morphology: NORMAL
SEG: 41 % (ref 40–75)

## 2011-07-27 LAB — FERRITIN: Ferritin: 1618 ng/mL — ABNORMAL HIGH (ref 22–322)

## 2011-07-27 NOTE — Progress Notes (Signed)
CC:   Vanita Panda. Magnus Ivan, M.D. Cassell Clement, M.D.  DIAGNOSES: 1. Refractory anemia. 2. Chronic atrial fibrillation.  CURRENT THERAPY: 1. Aranesp 300 mcg subcu as needed for hemoglobin less than 10. 2. Coumadin, lifelong.  INTERIM HISTORY:  Mr. Peral comes in for followup.  Unfortunately he fell down some steps yesterday.  Thankfully, he did not hurt himself. He did not bleed.  He did not hit his head.  He is a little bit sore but is getting around.  He is complaining of some pain in the right knee.  He has had issues with this.  He will see Dr. Magnus Ivan about this.  He has had no problems with atrial fibrillation.  This is well- controlled.  Dr. Patty Sermons has done a good job monitoring this.  We are watching his ferritin.  His last ferritin back in May was 1352.  He is eating well.  He is having no nausea or vomiting.  There is no headache.  He has had no change in bowel or bladder habits.  PHYSICAL EXAMINATION:  General:  This is a well-developed, well- nourished white gentleman in no obvious distress.  Vital signs: Temperature 97.6, pulse 89, respiratory rate 20, blood pressure 97/59. Weight is 219.  Head and neck:  Normocephalic, atraumatic skull.  There are no ocular or oral lesions.  There are no palpable cervical or supraclavicular lymph nodes.  Lungs:  Clear bilaterally.  Cardiac: Irregular rate and rhythm consistent with atrial fibrillation.  The rate is well-controlled.  Abdomen:  Soft with good bowel sounds.  There is no fluid wave.  There is no palpable abdominal mass.  There is no palpable hepatosplenomegaly.  Back:  No tenderness over the spine.  He did have an abrasion on the upper thoracic spinal area.  Extremities:  Shows some slight swelling in the right knee.  LABORATORY STUDIES:  White cell count 5, hemoglobin 11.3, hematocrit 33.7, platelet count 123.  INR is 2.5.  IMPRESSION:  Mr. Dunlap is an 78 year old gentleman with refractory anemia.  He  has actually done incredibly well.  We have not had to transfuse him for I think a couple years.  He has not needed Aranesp for probably 8-9 months.  There is an element of iron overload.  He does have a high level of iron saturation.  Again, we are watching this closely.  We will plan to get him back in 2 more months.  I do not see that we need to do any blood work in-between visits.    ______________________________ Josph Macho, M.D. PRE/MEDQ  D:  07/27/2011  T:  07/27/2011  Job:  [id number]

## 2011-08-06 ENCOUNTER — Other Ambulatory Visit: Payer: Self-pay | Admitting: *Deleted

## 2011-08-06 DIAGNOSIS — I4891 Unspecified atrial fibrillation: Secondary | ICD-10-CM

## 2011-08-06 MED ORDER — WARFARIN SODIUM 5 MG PO TABS: 5.0000 mg | ORAL_TABLET | Freq: Every day | ORAL | Status: DC

## 2011-08-31 ENCOUNTER — Encounter: Payer: Self-pay | Admitting: Cardiology

## 2011-08-31 ENCOUNTER — Ambulatory Visit (INDEPENDENT_AMBULATORY_CARE_PROVIDER_SITE_OTHER): Payer: Medicare Other | Admitting: Cardiology

## 2011-08-31 VITALS — BP 106/68 | HR 82 | Ht 74.0 in | Wt 216.0 lb

## 2011-08-31 DIAGNOSIS — D4620 Refractory anemia with excess of blasts, unspecified: Secondary | ICD-10-CM

## 2011-08-31 DIAGNOSIS — I509 Heart failure, unspecified: Secondary | ICD-10-CM

## 2011-08-31 DIAGNOSIS — D46Z Other myelodysplastic syndromes: Secondary | ICD-10-CM

## 2011-08-31 DIAGNOSIS — I5022 Chronic systolic (congestive) heart failure: Secondary | ICD-10-CM

## 2011-08-31 DIAGNOSIS — I4891 Unspecified atrial fibrillation: Secondary | ICD-10-CM

## 2011-08-31 DIAGNOSIS — Z950 Presence of cardiac pacemaker: Secondary | ICD-10-CM

## 2011-08-31 NOTE — Assessment & Plan Note (Signed)
The patient is followed for this by his hematologist Dr. Myna Hidalgo

## 2011-08-31 NOTE — Telephone Encounter (Signed)
Has been scheduled for office visit

## 2011-08-31 NOTE — Assessment & Plan Note (Signed)
The patient has not been experiencing any increasing shortness of breath.  Does have chronic systolic congestive heart failure with ejection fraction of 45-50% by echocardiogram in May 2010.  He has not been experiencing any orthopnea or paroxysmal nocturnal dyspnea.  He has occasional ankle edema if he sits too long without stirring.

## 2011-08-31 NOTE — Progress Notes (Signed)
[name] Date of Birth:  1927-12-26 [facility] [address] [place], [place]  [address] [phone]         Fax   [phone]  History of Present Illness: This pleasant 78 year old gentleman is seen for a six-month followup office visit.  He has a history of chronic congestive heart failure.  He was hospitalized for this at [facility] in 2009.  He has a past history of established atrial fibrillation and had an ablation of his AV node with implantation of a biventricular pacemaker on 12/28/06 by Dr. Luella Cook at [facility].  The patient has been on chronic Coumadin for his atrial fibrillation.  The patient previously was participating in a clinical research study involving his pacemaker being done at [facility].  The study has now been completed and he has changed over to getting his pacemaker care at [facility] cardiology. Current Outpatient Prescriptions  Medication Sig Dispense Refill  . alfuzosin (UROXATRAL) 10 MG 24 hr tablet Take 10 mg by mouth daily.        . carvedilol (COREG) 6.25 MG tablet TAKE ONE TABLET BY MOUTH TWICE DAILY  180 tablet  3  . famotidine (PEPCID) 20 MG tablet Take 20 mg by mouth 2 (two) times daily.        . finasteride (PROSCAR) 5 MG tablet Take 5 mg by mouth daily.        . furosemide (LASIX) 40 MG tablet TAKE ONE TABLET BY MOUTH EVERY DAY  90 tablet  3  . Multiple Vitamin (MULTI-VITAMIN PO) Take by mouth daily.        Marland Kitchen pyridoxine (B-6) 100 MG tablet Take 100 mg by mouth 2 (two) times daily.        Marland Kitchen warfarin (COUMADIN) 5 MG tablet Take 1 tablet (5 mg total) by mouth daily.  90 tablet  2    Allergies  Allergen Reactions  . Moxifloxacin Hcl In Nacl Other (See Comments)    Pt c/o being "out of his head" & itching    Patient Active Problem List  Diagnosis  . Atrial fibrillation with controlled ventricular response  . Chronic systolic congestive heart failure  . Pacemaker  . Shoulder impingement syndrome  .  MDS (myelodysplastic syndrome), low grade  . Iron overload, transfusional    History  Smoking status  . Former Smoker  Smokeless tobacco  . Not on file    History  Alcohol Use No    Family History  Problem Relation Age of Onset  . Cancer Mother     Review of Systems: Constitutional: no fever chills diaphoresis or fatigue or change in weight.  Head and neck: no hearing loss, no epistaxis, no photophobia or visual disturbance. Respiratory: No cough, shortness of breath or wheezing. Cardiovascular: No chest pain peripheral edema, palpitations. Gastrointestinal: No abdominal distention, no abdominal pain, no change in bowel habits hematochezia or melena. Genitourinary: No dysuria, no frequency, no urgency, no nocturia. Musculoskeletal:No arthralgias, no back pain, no gait disturbance or myalgias. Neurological: No dizziness, no headaches, no numbness, no seizures, no syncope, no weakness, no tremors. Hematologic: No lymphadenopathy, no easy bruising. Psychiatric: No confusion, no hallucinations, no sleep disturbance.    Physical Exam: Filed Vitals:   08/31/11 0944  BP: 106/68  Pulse: 82   the general appearance reveals a well-developed well-nourished elderly gentleman in no distress.The head and neck exam reveals pupils equal and reactive.  Extraocular movements are full.  There is no scleral icterus.  The  mouth and pharynx are normal.  The neck is supple.  The carotids reveal no bruits.  The jugular venous pressure is normal.  The  thyroid is not enlarged.  There is no lymphadenopathy.  The chest is clear to percussion and auscultation.  There are no rales or rhonchi.  Expansion of the chest is symmetrical.  The precordium is quiet.  The first heart sound is normal.  The second heart sound is physiologically split.  There is no murmur gallop rub or click.  There is no abnormal lift or heave.  The abdomen is soft and nontender.  The bowel sounds are normal.  The liver and spleen are  not enlarged.  There are no abdominal masses.  There are no abdominal bruits.  Extremities reveal good pedal pulses.  There is no phlebitis and there is trace edema.  There is no cyanosis or clubbing.  Strength is normal and symmetrical in all extremities.  There is no lateralizing weakness.  There are no sensory deficits.  The skin is warm and dry.  There is no rash.  EKG today shows atrial fibrillation with a paced ventricular rhythm.  Assessment / Plan: Continue same medication.  Recheck in 6 months for followup office visit.

## 2011-08-31 NOTE — Assessment & Plan Note (Signed)
The pacemaker will be checked in the pacemaker clinic next week

## 2011-08-31 NOTE — Patient Instructions (Addendum)
Your physician recommends that you continue on your current medications as directed. Please refer to the Current Medication list given to you today.  Your physician wants you to follow-up in: 6 months. You will receive a reminder letter in the mail two months in advance. If you don't receive a letter, please call our office to schedule the follow-up appointment.  

## 2011-08-31 NOTE — Assessment & Plan Note (Signed)
The patient is on long-term Coumadin.  He has had no thromboembolic episodes.  He has not had a problems with bleeding from his Coumadin.

## 2011-09-10 ENCOUNTER — Ambulatory Visit (INDEPENDENT_AMBULATORY_CARE_PROVIDER_SITE_OTHER): Payer: Medicare Other | Admitting: Internal Medicine

## 2011-09-10 ENCOUNTER — Encounter: Payer: Self-pay | Admitting: Internal Medicine

## 2011-09-10 VITALS — BP 130/70 | HR 58 | Resp 18 | Ht 74.0 in | Wt 217.1 lb

## 2011-09-10 DIAGNOSIS — I509 Heart failure, unspecified: Secondary | ICD-10-CM

## 2011-09-10 DIAGNOSIS — I442 Atrioventricular block, complete: Secondary | ICD-10-CM

## 2011-09-10 DIAGNOSIS — I5022 Chronic systolic (congestive) heart failure: Secondary | ICD-10-CM

## 2011-09-10 DIAGNOSIS — I4891 Unspecified atrial fibrillation: Secondary | ICD-10-CM

## 2011-09-10 NOTE — Patient Instructions (Signed)
Your physician wants you to follow-up in: 12 months with Dr Jacquiline Doe will receive a reminder letter in the mail two months in advance. If you don't receive a letter, please call our office to schedule the follow-up appointment.  Remote monitoring is used to monitor your Pacemaker of ICD from home. This monitoring reduces the number of office visits required to check your device to one time per year. It allows [place] to keep an eye on the functioning of your device to ensure it is working properly. You are scheduled for a device check from home on 12/14/11. You may send your transmission at any time that day. If you have a wireless device, the transmission will be sent automatically. After your physician reviews your transmission, you will receive a postcard with your next transmission date.   Your physician has requested that you have an echocardiogram. Echocardiography is a painless test that uses sound waves to create images of your heart. It provides your doctor with information about the size and shape of your heart and how well your heart's chambers and valves are working. This procedure takes approximately one hour. There are no restrictions for this procedure.

## 2011-09-12 ENCOUNTER — Encounter: Payer: Self-pay | Admitting: Internal Medicine

## 2011-09-12 DIAGNOSIS — I442 Atrioventricular block, complete: Secondary | ICD-10-CM | POA: Insufficient documentation

## 2011-09-12 NOTE — Progress Notes (Signed)
Henry Rima, [name]: Primary Cardiologist:  Dr Tillie Fantasia [name] is a 78 y.o. male with a h/o permanent atrial fibrillation sp AV nodal ablation and biventricular (MDT) by Dr Sharol Harness at [facility] who presents today to establish care in the Electrophysiology device clinic.   He had difficulty with afib and RVR for which he underwent AV nodal ablation 12/2006.  He has done very well since that time.  His EF was previously mildly depressed (45-50%) though at this point he has CHF symptoms.  He was enrolled in the Block HF trial through [facility] and appears to have been randomized to RV pacing only.  The study was recently completed.  He states that his device was not reprogrammed.  He now wishes to have his device followed with [facility] in [place]. The patient remains very active despite his age.   Today, he  denies symptoms of palpitations, chest pain, shortness of breath, orthopnea, PND, lower extremity edema, dizziness, presyncope, syncope, or neurologic sequela.  The patientis tolerating medications without difficulties and is otherwise without complaint today.   Past Medical History  Diagnosis Date  . Bursitis     OF THE RIGHT SHOULDER  . Tinnitus   . Easy bruising     SOME FROM HIS COUMADIN  . Permanent atrial fibrillation     s/p AV nodal ablation  . H/O: pneumonia     x3  . Depressed ejection fraction 10    EF previously 45-50%, s/p AV nodal ablation and biv pacemaker implant, previously enrolled in Block HF and was randomized to RV pacing only)  . Chronic anemia     RECIEVES ARANESP SHOTS IF HIS HEMOGLOBIN FALLS BELOW 11  . Blood transfusion     june 12 surgery  . GERD (gastroesophageal reflux disease)   . Chronic kidney disease     stones 85  . MDS (myelodysplastic syndrome), low grade 02/10/2011  . Iron overload, transfusional 02/10/2011  . Complete heart block 12/28/06    s/p AV nodal ablation and BIV pacemaker implant at [facility] by Dr Sharol Harness   Past  Surgical History  Procedure Date  . Knee surgery     right Dec.2011,left June 2012  . Pacemaker insertion 12/28/06    Medtronic Insync BiV pacemaker implanted at [facility] by Dr Sharol Harness, enrolled in the Block HF study (now completed)  . Joint replacement   . Hernia repair   . Pilonidal cyst excision 72?  Marland Kitchen Kidney stone surgery   . Av nodal ablation 12/08    at [facility]    History   Social History  . Marital Status: Married    Spouse Name: N/A    Number of Children: N/A  . Years of Education: N/A   Occupational History  . Not on file.   Social History Main Topics  . Smoking status: Former Games developer  . Smokeless tobacco: Not on file  . Alcohol Use: No  . Drug Use: No  . Sexually Active:    Other Topics Concern  . Not on file   Social History Narrative  . No narrative on file    Family History  Problem Relation Age of Onset  . Cancer Mother     Allergies  Allergen Reactions  . Moxifloxacin Hcl In Nacl Other (See Comments)    Pt c/o being "out of his head" & itching    Current Outpatient Prescriptions  Medication Sig Dispense Refill  . alfuzosin (UROXATRAL) 10 MG 24 hr tablet Take  10 mg by mouth daily.        . carvedilol (COREG) 6.25 MG tablet TAKE ONE TABLET BY MOUTH TWICE DAILY  180 tablet  3  . famotidine (PEPCID) 20 MG tablet Take 20 mg by mouth 2 (two) times daily.        . finasteride (PROSCAR) 5 MG tablet Take 5 mg by mouth daily.        . furosemide (LASIX) 40 MG tablet TAKE ONE TABLET BY MOUTH EVERY DAY  90 tablet  3  . Multiple Vitamin (MULTI-VITAMIN PO) Take by mouth daily.        Marland Kitchen pyridoxine (B-6) 100 MG tablet Take 100 mg by mouth 2 (two) times daily.        Marland Kitchen warfarin (COUMADIN) 5 MG tablet Take 1 tablet (5 mg total) by mouth daily.  90 tablet  2    ROS- all systems are reviewed and negative except as per HPI  Physical Exam: Filed Vitals:   09/10/11 1136  BP: 130/70  Pulse: 58  Resp: 18  Height: 6\' 2"  (1.88 m)  Weight: 217 lb  1.9 oz (98.485 kg)  SpO2: 97%    GEN- The patient is well appearing, alert and oriented x 3 today.   Head- normocephalic, atraumatic Eyes-  Sclera clear, conjunctiva pink Ears- hearing intact Oropharynx- clear Neck- supple, no JVP Lymph- no cervical lymphadenopathy Lungs- Clear to ausculation bilaterally, normal work of breathing Chest- pacemaker pocket is well healed Heart- Regular rate and rhythm (paced), no murmurs, rubs or gallops, PMI not laterally displaced GI- soft, NT, ND, + BS Extremities- no clubbing, cyanosis, or edema MS- no significant deformity or atrophy Skin- no rash or lesion Psych- euthymic mood, full affect Neuro- strength and sensation are intact  Pacemaker interrogation- reviewed in detail today,  See PACEART report EKG today (after reprogramming) reveals afib with BiV pacing at 80 bpm, QRS duration is 174 msec  Assessment and Plan:

## 2011-09-12 NOTE — Assessment & Plan Note (Signed)
Normal pacemaker function See Pace Art report  BiV pacing turned on today (as above).  [facility] every 3 months Return in 1 year

## 2011-09-12 NOTE — Assessment & Plan Note (Signed)
euvolemic today He has not had a recent echo  He was enrolled in the Block HF study which compared RV to BIV pacing.  The study revealed potential benefit with CRT in similar patients.  [name] was randomized to RV only pacing for the study and appears to be doing quite well.  The important question now is whether he should continue RV only pacing or should he be reprogrammed based on the study findings of benefit with CRT. At this point, I think that we should at least try BiV pacing.  I have therefore reprogrammed his device today with the LV lead turned on (RV/LV simultaneous pacing).  I will obtain a baseline echo.  We will also follow-up clinically.  If he clinically improves then we know that we made the right decision.  If he has any symptomatic decline then he is instructed to alert our office.  We can then return him to his prior pacemaker settings. Of note, turning on BiV pacing will reduce his battery longevity by an estimation of 1 year.

## 2011-09-12 NOTE — Assessment & Plan Note (Signed)
S/p AV nodal ablation and PPM Continue lifelong anticoagulation

## 2011-09-22 ENCOUNTER — Ambulatory Visit (HOSPITAL_COMMUNITY): Payer: Medicare Other | Attending: Cardiology | Admitting: Radiology

## 2011-09-22 DIAGNOSIS — I059 Rheumatic mitral valve disease, unspecified: Secondary | ICD-10-CM | POA: Insufficient documentation

## 2011-09-22 DIAGNOSIS — I319 Disease of pericardium, unspecified: Secondary | ICD-10-CM | POA: Insufficient documentation

## 2011-09-22 DIAGNOSIS — I442 Atrioventricular block, complete: Secondary | ICD-10-CM | POA: Insufficient documentation

## 2011-09-22 DIAGNOSIS — I509 Heart failure, unspecified: Secondary | ICD-10-CM | POA: Insufficient documentation

## 2011-09-22 DIAGNOSIS — I4891 Unspecified atrial fibrillation: Secondary | ICD-10-CM

## 2011-09-22 DIAGNOSIS — I079 Rheumatic tricuspid valve disease, unspecified: Secondary | ICD-10-CM | POA: Insufficient documentation

## 2011-09-22 DIAGNOSIS — Z87891 Personal history of nicotine dependence: Secondary | ICD-10-CM | POA: Insufficient documentation

## 2011-09-22 NOTE — Progress Notes (Signed)
Echocardiogram performed.  

## 2011-09-28 ENCOUNTER — Other Ambulatory Visit (HOSPITAL_BASED_OUTPATIENT_CLINIC_OR_DEPARTMENT_OTHER): Payer: Medicare Other | Admitting: Lab

## 2011-09-28 ENCOUNTER — Ambulatory Visit (HOSPITAL_BASED_OUTPATIENT_CLINIC_OR_DEPARTMENT_OTHER): Payer: Medicare Other | Admitting: Hematology & Oncology

## 2011-09-28 VITALS — BP 102/53 | HR 86 | Temp 97.8°F | Resp 20 | Ht 72.0 in | Wt 220.0 lb

## 2011-09-28 DIAGNOSIS — I4891 Unspecified atrial fibrillation: Secondary | ICD-10-CM

## 2011-09-28 DIAGNOSIS — D4620 Refractory anemia with excess of blasts, unspecified: Secondary | ICD-10-CM

## 2011-09-28 LAB — MANUAL DIFFERENTIAL (CHCC SATELLITE)
BASO: 0 % (ref 0–2)
Eos: 1 % (ref 0–7)
Myelocytes: 0 % (ref 0–0)
Other Comments: 0
PROMYELO: 0 % (ref 0–0)

## 2011-09-28 LAB — CBC WITH DIFFERENTIAL (CANCER CENTER ONLY)
MCH: 32.2 pg (ref 28.0–33.4)
MCHC: 33.3 g/dL (ref 32.0–35.9)
MCV: 97 fL (ref 82–98)
Platelets: 119 10*3/uL — ABNORMAL LOW (ref 145–400)
RDW: 13.5 % (ref 11.1–15.7)
WBC: 4.5 10*3/uL (ref 4.0–10.0)

## 2011-09-28 LAB — RETICULOCYTES (CHCC)
ABS Retic: 20.3 10*3/uL (ref 19.0–186.0)
RBC.: 3.38 MIL/uL — ABNORMAL LOW (ref 4.22–5.81)
Retic Ct Pct: 0.6 % (ref 0.4–2.3)

## 2011-09-28 LAB — IRON AND TIBC
%SAT: 87 % — ABNORMAL HIGH (ref 20–55)
TIBC: 193 ug/dL — ABNORMAL LOW (ref 215–435)

## 2011-09-28 NOTE — Progress Notes (Signed)
This office note has been dictated.

## 2011-09-29 NOTE — Progress Notes (Signed)
CC:   Cassell Clement, M.D.  DIAGNOSES: 1. Refractory anemia. 2. Chronic atrial fibrillation.  CURRENT THERAPY: 1. Aranesp 300 mcg subcu as needed for hemoglobin less than 10. 2. Coumadin, lifelong.  INTERIM HISTORY:  Mr. Luczak comes in for followup.  We saw him back in July.  Since then, he has had no problems.  He has had no bleeding or bruising.  He has had no cough or shortness of breath.  He has had no leg swelling.  He has had no rashes.  He has had no complications from the atrial fibrillation.  He has been taking Coumadin, and he has done real well with this.  He just saw Dr. Patty Sermons a couple of weeks ago.  From what Mr. Henery says, Dr. Patty Sermons is fairly satisfied Mr. Cumpton's status.  PHYSICAL EXAMINATION:  This is a well-developed, well-nourished white gentleman in no obvious distress.  Vital signs:  Temperature of 97.8, pulse 86, respiratory rate 20, blood pressure 102/53.  Weight is 220. Head and neck:  Normocephalic, atraumatic skull.  There are no ocular or oral lesions.  There are no palpable cervical or supraclavicular lymph nodes.  Lungs:  Clear bilaterally.  Cardiac:  Regular rate and rhythm with a normal S1 and S2.  There are no murmurs, rubs or bruits. Abdomen:  Soft with good bowel sounds.  There is no palpable abdominal mass.  There is no fluid wave.  There is no palpable hepatosplenomegaly. Back:  No tenderness over the spine, ribs, or hips.  Extremities:  No clubbing, cyanosis or edema.  Neurologic:  No focal neurological deficits.  LABORATORY STUDIES:  White cell count is 4.5, hemoglobin 10.6, hematocrit 31.8, platelet count 119.  INR is 2.9.  IMPRESSION:  Mr. Villamor is a is an 78 year old gentleman with refractory anemia.  He has done incredibly well with this.  We have not had to treat this for several years.  He has not needed any Aranesp probably for a year.  We do watch his iron studies.  When he was last here, his ferritin was 1618.   Again, I do not see any evidence of iron overload with him.  We have not transfused him for a couple of years.  We will plan to get back in 3 more months.  Again, we will continue to be conservative with our Aranesp and with transfusions.    ______________________________ Josph Macho, M.D. PRE/MEDQ  D:  09/28/2011  T:  09/29/2011  Job:  [id number]

## 2011-10-13 ENCOUNTER — Telehealth: Payer: Self-pay | Admitting: Internal Medicine

## 2011-10-13 NOTE — Telephone Encounter (Signed)
okay

## 2011-10-13 NOTE — Telephone Encounter (Signed)
Left message to call back  

## 2011-10-13 NOTE — Telephone Encounter (Signed)
Pt would like results of echo and he will be at home until 10am

## 2011-10-13 NOTE — Telephone Encounter (Signed)
Called patient back and advised of echo results.  Did try to schedule his labs and he stated he didn't have his calender and would just call back to schedule.  Asked patient if he would be coming soon and he stated yes he would call back soon.  Did try to schedule again and insisted on calling back    Notes Recorded by Regis Bill, LPN on 10/03/7827 at 11:41 AM Left message to call back ------  Notes Recorded by Cassell Clement, MD on 10/11/2011 at 5:18 PM Please report. The echo showed decrease in EF since last year. This should be helped by the adjustments to the pacemaker which Dr. Johney Frame made. We also want to consider adjusting his heart meds. I want him to come in for a baseline BMET to check his creatinine then we will consider adding an ace or ARB. Creatinine last year was 1.7. ------  Notes Recorded by Hillis Range, MD on 09/27/2011 at 4:25 PM Tom, With recent EP consult for Mr Jirak to establish care in the device clinic, I noticed that his BiV pacemaker was programmed to only pace his RV. He was clinically doing very well. This programming was due to his randomization to the control arm of a pacing study to evaluate RV vs BiV pacing at [facility]. I programmed him to start pacing in his LV when I saw him but felt that a baseline echo would be helpful. As you can see, his EF is severely depressed.  I would like for you to see him again for medicine optimization. He should have a repeat echo in 4-6 months to assess response to biv pacing now that his LV lead is turned on.  Thanks Fayrene Fearing ------  Notes Recorded by Hillis Range, MD on 09/27/2011 at 4:22 PM  Results reviewed. Please inform pt of result. EF severely depressed. We reprogrammed his ICD in the office to promote resynchronization.  He should return to see Dr Patty Sermons in 3 months with a repeat echo in 6 months.

## 2011-10-14 ENCOUNTER — Telehealth: Payer: Self-pay | Admitting: Cardiology

## 2011-10-14 DIAGNOSIS — I4891 Unspecified atrial fibrillation: Secondary | ICD-10-CM

## 2011-10-14 NOTE — Telephone Encounter (Signed)
Scheduled labs and order in system

## 2011-10-14 NOTE — Telephone Encounter (Signed)
New problem:  Patient would like to be set up for blood work.

## 2011-10-15 ENCOUNTER — Other Ambulatory Visit (INDEPENDENT_AMBULATORY_CARE_PROVIDER_SITE_OTHER): Payer: Medicare Other

## 2011-10-15 DIAGNOSIS — I4891 Unspecified atrial fibrillation: Secondary | ICD-10-CM

## 2011-10-15 LAB — BASIC METABOLIC PANEL
BUN: 23 mg/dL (ref 6–23)
Calcium: 9.2 mg/dL (ref 8.4–10.5)
GFR: 40.09 mL/min — ABNORMAL LOW (ref >60.00)
Potassium: 3.7 mEq/L (ref 3.5–5.1)

## 2011-10-16 ENCOUNTER — Telehealth: Payer: Self-pay | Admitting: *Deleted

## 2011-10-16 DIAGNOSIS — Z79899 Other long term (current) drug therapy: Secondary | ICD-10-CM

## 2011-10-16 MED ORDER — RAMIPRIL 1.25 MG PO CAPS: 1.2500 mg | ORAL_CAPSULE | Freq: Every day | ORAL | Status: DC

## 2011-10-16 NOTE — Telephone Encounter (Signed)
Advised and scheduled labs  

## 2011-10-16 NOTE — Telephone Encounter (Signed)
Message copied by Burnell Blanks on Fri Oct 16, 2011  6:01 PM ------      Message from: Cassell Clement      Created: Thu Oct 15, 2011  7:19 PM       Please report.Kidney function is stable.  Add ramipril 1.25 mg daily. Recheck BMET in 7-10 days to assure stability on ACEi

## 2011-10-22 LAB — PACEMAKER DEVICE OBSERVATION

## 2011-10-26 ENCOUNTER — Telehealth: Payer: Self-pay | Admitting: Internal Medicine

## 2011-10-26 ENCOUNTER — Other Ambulatory Visit (INDEPENDENT_AMBULATORY_CARE_PROVIDER_SITE_OTHER): Payer: Medicare Other

## 2011-10-26 DIAGNOSIS — Z79899 Other long term (current) drug therapy: Secondary | ICD-10-CM

## 2011-10-26 LAB — BASIC METABOLIC PANEL
BUN: 26 mg/dL — ABNORMAL HIGH (ref 6–23)
Calcium: 9.3 mg/dL (ref 8.4–10.5)
Chloride: 108 mEq/L (ref 96–112)
Creatinine, Ser: 2.1 mg/dL — ABNORMAL HIGH (ref 0.4–1.5)
GFR: 33.18 mL/min — ABNORMAL LOW (ref >60.00)

## 2011-10-26 NOTE — Telephone Encounter (Signed)
New Message:  Pt is unsure how to check his pacer device from home per his instructions.  Please call to advise to advise.

## 2011-10-26 NOTE — Telephone Encounter (Signed)
Number given to [email or web address]

## 2011-10-27 ENCOUNTER — Other Ambulatory Visit: Payer: Self-pay | Admitting: Cardiology

## 2011-10-28 ENCOUNTER — Telehealth: Payer: Self-pay | Admitting: *Deleted

## 2011-10-28 NOTE — Telephone Encounter (Signed)
Message copied by Burnell Blanks on Wed Oct 28, 2011  5:48 PM ------      Message from: Cassell Clement      Created: Wed Oct 28, 2011  4:45 PM       Please report.  Kidney function is worse. Stop ramipril. I will add ACEi to list of allergies.

## 2011-10-28 NOTE — Telephone Encounter (Signed)
Advised patient.  Patient stated he was not to come back and see  Dr. Patty Sermons for about 6 months, will forward to  Dr. Patty Sermons to see if he needs to come in before then

## 2011-10-29 NOTE — Telephone Encounter (Signed)
No need to see earlier unless he is having more symptoms or new symptoms.

## 2011-11-03 NOTE — Telephone Encounter (Signed)
Left message to call back  

## 2011-11-04 NOTE — Telephone Encounter (Signed)
New PRoblem:    Patient returning your call.  Please call back.

## 2011-11-04 NOTE — Telephone Encounter (Signed)
Advised patient

## 2011-11-10 ENCOUNTER — Other Ambulatory Visit: Payer: Self-pay | Admitting: Cardiology

## 2011-11-27 ENCOUNTER — Ambulatory Visit (HOSPITAL_BASED_OUTPATIENT_CLINIC_OR_DEPARTMENT_OTHER): Payer: Medicare Other | Admitting: Hematology & Oncology

## 2011-11-27 ENCOUNTER — Other Ambulatory Visit (HOSPITAL_BASED_OUTPATIENT_CLINIC_OR_DEPARTMENT_OTHER): Payer: Medicare Other | Admitting: Lab

## 2011-11-27 VITALS — BP 102/57 | HR 84 | Temp 97.2°F | Resp 18 | Ht 74.0 in | Wt 216.0 lb

## 2011-11-27 DIAGNOSIS — D4620 Refractory anemia with excess of blasts, unspecified: Secondary | ICD-10-CM

## 2011-11-27 DIAGNOSIS — I4891 Unspecified atrial fibrillation: Secondary | ICD-10-CM

## 2011-11-27 DIAGNOSIS — Z7901 Long term (current) use of anticoagulants: Secondary | ICD-10-CM

## 2011-11-27 LAB — FERRITIN: Ferritin: 1351 ng/mL — ABNORMAL HIGH (ref 22–322)

## 2011-11-27 LAB — MANUAL DIFFERENTIAL (CHCC SATELLITE)
ANC (CHCC HP manual diff): 2.3 10*3/uL (ref 1.5–6.5)
Band Neutrophils: 3 % (ref 0–10)
Eos: 1 % (ref 0–7)
LYMPH: 35 % (ref 14–48)
SEG: 47 % (ref 40–75)

## 2011-11-27 LAB — RETICULOCYTES (CHCC): ABS Retic: 21.2 10*3/uL (ref 19.0–186.0)

## 2011-11-27 LAB — CBC WITH DIFFERENTIAL (CANCER CENTER ONLY)
HGB: 10.9 g/dL — ABNORMAL LOW (ref 13.0–17.1)
MCH: 32.1 pg (ref 28.0–33.4)
MCV: 97 fL (ref 82–98)
Platelets: 120 10*3/uL — ABNORMAL LOW (ref 145–400)
RBC: 3.4 10*6/uL — ABNORMAL LOW (ref 4.20–5.70)
WBC: 4.5 10*3/uL (ref 4.0–10.0)

## 2011-11-27 LAB — IRON AND TIBC
%SAT: 67 % — ABNORMAL HIGH (ref 20–55)
Iron: 121 ug/dL (ref 42–165)
UIBC: 59 ug/dL — ABNORMAL LOW (ref 125–400)

## 2011-11-27 LAB — PROTIME-INR (CHCC SATELLITE)

## 2011-11-27 NOTE — Progress Notes (Signed)
This office note has been dictated.

## 2011-11-28 NOTE — Progress Notes (Signed)
CC:   [name], M.D.  DIAGNOSES: 1. Refractory anemia. 2. Chronic atrial fibrillation.  CURRENT THERAPY: 1. Aranesp 300 mcg as needed for hemoglobin less than 10. 2. Lifelong Coumadin.  INTERIM HISTORY:  [name] comes in for his followup.  We last saw him back in September.  Since then he has been doing well.  He has had no complaints.  There has been no bleeding.  He has had no problems with his atrial fibrillation.  There is no shortness of breath.  He has had no leg swelling.  He has had no headache or blurred vision.  PHYSICAL EXAMINATION:  General:  This is an elderly but well-nourished white gentleman in no obvious distress.  Vital signs:  Show a temperature of 97.3, pulse 84, respiratory rate 18, blood pressure 102/57.  Weight is 216 pounds.  Head and neck:  Shows a normocephalic, atraumatic skull.  There are no ocular or oral lesions.  There are no palpable cervical or supraclavicular lymph nodes.  Lungs:  Clear bilaterally.  Cardiac:  Regular rate and rhythm with a normal S1, S2. There are no murmurs, rubs or bruits.  Abdomen:  Soft with good bowel sounds.  There is no palpable abdominal mass.  There is no palpable hepatosplenomegaly.  Back:  No tenderness over the spine, ribs or hips. Extremities:  Shows no clubbing, cyanosis or edema.  Neurological: Shows no focal neurological deficits.  LABORATORY STUDIES:  White cell count is 4.5, hemoglobin 10.9, hematocrit 33, platelet count 120.  Iron saturation is 67%.  IMPRESSION:  [name] is an 78 year old gentleman with refractory anemia.  He has done incredibly well.  We have not had to give him Aranesp now for a couple of years.  We did treat him in the past with chemotherapy but this has been about 3 or 4 years.  We will plan to get him back in 2 or 3 more months.  I do not see that we need to do any blood work in-between visits.    ______________________________ Josph Macho, M.D. PRE/MEDQ  D:   11/27/2011  T:  11/28/2011  Job:  [id number]

## 2011-12-14 ENCOUNTER — Telehealth: Payer: Self-pay | Admitting: Cardiology

## 2011-12-14 NOTE — Telephone Encounter (Signed)
plz return call to pt [phone] regarding remote defib ck

## 2011-12-14 NOTE — Telephone Encounter (Signed)
Spoke with patient and explained that he is not enrolled in remote follow up and scheduled him to see Dr. Johney Frame in February.

## 2012-01-25 ENCOUNTER — Ambulatory Visit (HOSPITAL_BASED_OUTPATIENT_CLINIC_OR_DEPARTMENT_OTHER): Payer: Medicare Other | Admitting: Hematology & Oncology

## 2012-01-25 ENCOUNTER — Other Ambulatory Visit (HOSPITAL_BASED_OUTPATIENT_CLINIC_OR_DEPARTMENT_OTHER): Payer: Medicare Other | Admitting: Lab

## 2012-01-25 VITALS — BP 108/55 | HR 72 | Temp 97.6°F | Resp 18 | Ht 74.0 in | Wt 217.0 lb

## 2012-01-25 DIAGNOSIS — D4620 Refractory anemia with excess of blasts, unspecified: Secondary | ICD-10-CM

## 2012-01-25 DIAGNOSIS — I4891 Unspecified atrial fibrillation: Secondary | ICD-10-CM

## 2012-01-25 LAB — IRON AND TIBC: Iron: 196 ug/dL — ABNORMAL HIGH (ref 42–165)

## 2012-01-25 LAB — RETICULOCYTES (CHCC)
ABS Retic: 19 10*3/uL (ref 19.0–186.0)
Retic Ct Pct: 0.5 % (ref 0.4–2.3)

## 2012-01-25 LAB — MANUAL DIFFERENTIAL (CHCC SATELLITE)
Myelocytes: 1 % — ABNORMAL HIGH (ref 0–0)
PLT EST ~~LOC~~: DECREASED

## 2012-01-25 LAB — CBC WITH DIFFERENTIAL (CANCER CENTER ONLY)
MCH: 31.7 pg (ref 28.0–33.4)
MCHC: 33 g/dL (ref 32.0–35.9)
MCV: 96 fL (ref 82–98)
Platelets: 141 10*3/uL — ABNORMAL LOW (ref 145–400)
RBC: 3.66 10*6/uL — ABNORMAL LOW (ref 4.20–5.70)
RDW: 13.4 % (ref 11.1–15.7)

## 2012-01-25 LAB — FERRITIN: Ferritin: 1447 ng/mL — ABNORMAL HIGH (ref 22–322)

## 2012-01-25 NOTE — Progress Notes (Signed)
This office note has been dictated.

## 2012-01-26 NOTE — Progress Notes (Signed)
CC:   Cassell Clement, M.D.  DIAGNOSES: 1. Refractory anemia. 2. Atrial fibrillation.  CURRENT THERAPY: 1. Aranesp 300 mcg subcu as needed for hemoglobin less than 10. 2. Coumadin-lifelong.  INTERIM HISTORY:  Mr. [name] comes in for followup.  He is doing fairly well.  He enjoyed the holidays.  He had no problems over the holidays.  There are no problems with nausea or vomiting.  He has had no bleeding. He has had no chest pain.  He has had no issues with congestive heart failure.  He has had no leg swelling.  PHYSICAL EXAMINATION:  General:  This is a well-developed, well- nourished white gentleman in no obvious distress.  Vital signs: Temperature of 97.6, pulse 72, respiratory rate 18, blood pressure 108/55.  Weight is 217.  Head and neck:  Normocephalic, atraumatic skull.  There are no ocular or oral lesions.  There are no palpable cervical or supraclavicular lymph nodes.  Lungs:  Clear bilaterally. Cardiac:  Regular rate and rhythm with occasional extra beat.  He has a 1/6 systolic ejection murmur.  Abdomen:  Soft with good bowel sounds. There is no fluid wave.  There is no palpable hepatosplenomegaly.  Back: No tenderness over the spine, ribs, or hips.  Extremities:  No clubbing, cyanosis or edema.  Neurological:  No focal neurological deficits.  LABORATORY STUDIES:  White cell count is 5.8, hemoglobin 11.6, hematocrit 35.2, platelet count 141.  INR is 3.  IMPRESSION:  Mr. [name] is an 79 year old gentleman who certainly does not look at his age.  He has done incredibly well with refractory anemia.  We have not had to give him any treatment for this for over 3 years.  I do not see any issues with respect to his refractory anemia.  I think we can get him back in 2 more months.  I think this is a good period of followup.    ______________________________ Josph Macho, M.D. PRE/MEDQ  D:  01/25/2012  T:  01/26/2012  Job:  [id number]

## 2012-02-26 ENCOUNTER — Encounter: Payer: Self-pay | Admitting: *Deleted

## 2012-03-09 ENCOUNTER — Other Ambulatory Visit: Payer: Medicare Other | Admitting: Lab

## 2012-03-09 DIAGNOSIS — D4620 Refractory anemia with excess of blasts, unspecified: Secondary | ICD-10-CM

## 2012-03-09 DIAGNOSIS — I4891 Unspecified atrial fibrillation: Secondary | ICD-10-CM

## 2012-03-09 LAB — MANUAL DIFFERENTIAL (CHCC SATELLITE)
ALC: 2.2 10*3/uL (ref 0.9–3.3)
BASO: 2 % (ref 0–2)
Eos: 1 % (ref 0–7)
LYMPH: 46 % (ref 14–48)
SEG: 28 % — ABNORMAL LOW (ref 40–75)

## 2012-03-09 LAB — CBC WITH DIFFERENTIAL (CANCER CENTER ONLY)
MCH: 31.7 pg (ref 28.0–33.4)
MCHC: 33.3 g/dL (ref 32.0–35.9)
Platelets: 128 10*3/uL — ABNORMAL LOW (ref 145–400)

## 2012-03-09 LAB — PROTIME-INR (CHCC SATELLITE): Protime: 21.6 Seconds — ABNORMAL HIGH (ref 10.6–13.4)

## 2012-03-16 ENCOUNTER — Ambulatory Visit (INDEPENDENT_AMBULATORY_CARE_PROVIDER_SITE_OTHER): Payer: Medicare Other | Admitting: Internal Medicine

## 2012-03-16 ENCOUNTER — Encounter: Payer: Self-pay | Admitting: Internal Medicine

## 2012-03-16 VITALS — BP 113/68 | HR 81 | Ht 75.0 in | Wt 219.0 lb

## 2012-03-16 DIAGNOSIS — I442 Atrioventricular block, complete: Secondary | ICD-10-CM

## 2012-03-16 DIAGNOSIS — Z950 Presence of cardiac pacemaker: Secondary | ICD-10-CM

## 2012-03-16 DIAGNOSIS — I4891 Unspecified atrial fibrillation: Secondary | ICD-10-CM

## 2012-03-16 DIAGNOSIS — I509 Heart failure, unspecified: Secondary | ICD-10-CM

## 2012-03-16 DIAGNOSIS — I5022 Chronic systolic (congestive) heart failure: Secondary | ICD-10-CM

## 2012-03-16 LAB — PACEMAKER DEVICE OBSERVATION
RV LEAD IMPEDENCE PM: 631 Ohm
RV LEAD THRESHOLD: 1 V

## 2012-03-16 NOTE — Patient Instructions (Signed)
Your physician wants you to follow-up in: 6 months with device clinic and 12 months with Dr Johney Frame Bonita Quin will receive a reminder letter in the mail two months in advance. If you don't receive a letter, please call our office to schedule the follow-up appointment.  Your physician has requested that you have an echocardiogram. Echocardiography is a painless test that uses sound waves to create images of your heart. It provides your doctor with information about the size and shape of your heart and how well your heart's chambers and valves are working. This procedure takes approximately one hour. There are no restrictions for this procedure.

## 2012-03-16 NOTE — Progress Notes (Signed)
PCP: Lavell Islam, MD Primary Cardiologist:  Dr Tillie Fantasia [name] is a 79 y.o. male who presents today for routine electrophysiology followup.  Since last being seen in our clinic, the patient reports doing very well.  He recalls no clinical change with turning on biventricular pacing.  Today, he denies symptoms of palpitations, chest pain, shortness of breath,  lower extremity edema, dizziness, presyncope, or syncope.  The patient is otherwise without complaint today.   Past Medical History  Diagnosis Date  . Bursitis     OF THE RIGHT SHOULDER  . Tinnitus   . Easy bruising     SOME FROM HIS COUMADIN  . Permanent atrial fibrillation     s/p AV nodal ablation  . H/O: pneumonia     x3  . Depressed ejection fraction 10    EF previously 45-50%, s/p AV nodal ablation and biv pacemaker implant, previously enrolled in Block HF and was randomized to RV pacing only)  . Chronic anemia     RECIEVES ARANESP SHOTS IF HIS HEMOGLOBIN FALLS BELOW 11  . Blood transfusion     june 12 surgery  . GERD (gastroesophageal reflux disease)   . Chronic kidney disease     stones 85  . MDS (myelodysplastic syndrome), low grade 02/10/2011  . Iron overload, transfusional 02/10/2011  . Complete heart block 12/28/06    s/p AV nodal ablation and BIV pacemaker implant at [facility] by Dr Sharol Harness   Past Surgical History  Procedure Laterality Date  . Knee surgery      right Dec.2011,left June 2012  . Pacemaker insertion  12/28/06    Medtronic Insync BiV pacemaker implanted at [facility] by Dr Sharol Harness, enrolled in the Block HF study (now completed)  . Joint replacement    . Hernia repair    . Pilonidal cyst excision  70?  Marland Kitchen Kidney stone surgery    . Av nodal ablation  12/08    at [facility]    Current Outpatient Prescriptions  Medication Sig Dispense Refill  . alfuzosin (UROXATRAL) 10 MG 24 hr tablet Take 10 mg by mouth daily.        . carvedilol (COREG) 6.25 MG tablet TAKE ONE  TABLET BY MOUTH TWICE DAILY  180 tablet  2  . famotidine (PEPCID) 20 MG tablet Take 20 mg by mouth 2 (two) times daily.        . finasteride (PROSCAR) 5 MG tablet Take 5 mg by mouth daily.        . furosemide (LASIX) 40 MG tablet TAKE ONE TABLET BY MOUTH EVERY DAY  90 tablet  2  . Multiple Vitamin (MULTI-VITAMIN PO) Take by mouth daily.        Marland Kitchen pyridoxine (B-6) 100 MG tablet Take 100 mg by mouth 2 (two) times daily.        Marland Kitchen warfarin (COUMADIN) 5 MG tablet Take 5 mg by mouth daily.      . Hydrocodone-Acetaminophen 5-300 MG TABS        No current facility-administered medications for this visit.    Physical Exam: Filed Vitals:   03/16/12 1006  BP: 113/68  Pulse: 81  Height: 6\' 3"  (1.905 m)  Weight: 219 lb (99.338 kg)    GEN- The patient is well appearing, alert and oriented x 3 today.   Head- normocephalic, atraumatic Eyes-  Sclera clear, conjunctiva pink Ears- hearing intact Oropharynx- clear Lungs- Clear to ausculation bilaterally, normal work of breathing Chest- pacemaker pocket is  well healed Heart- Regular rate and rhythm, no murmurs, rubs or gallops, PMI not laterally displaced GI- soft, NT, ND, + BS Extremities- no clubbing, cyanosis, or edema  Pacemaker interrogation- reviewed in detail today,  See PACEART report Echo 8/13 reviewed with patient ekg today reveals afib with BiV pacing  Assessment and Plan:  1. Complete heart block Normal BiV pacemaker function See Pace Art report No changes today  2. Permanent afib Continue coumadin  3. Chronic systolic dysfunction Repeat echo at this time to assess EF response to BiV pacing  Follow-up with Dr Patty Sermons as scheduled.  Return to the device  Clinic in 6 months

## 2012-03-21 ENCOUNTER — Other Ambulatory Visit: Payer: Medicare Other | Admitting: Lab

## 2012-03-21 ENCOUNTER — Ambulatory Visit: Payer: Medicare Other | Admitting: Hematology & Oncology

## 2012-03-21 ENCOUNTER — Ambulatory Visit (HOSPITAL_BASED_OUTPATIENT_CLINIC_OR_DEPARTMENT_OTHER): Payer: Medicare Other | Admitting: Lab

## 2012-03-21 ENCOUNTER — Ambulatory Visit (HOSPITAL_BASED_OUTPATIENT_CLINIC_OR_DEPARTMENT_OTHER): Payer: Medicare Other | Admitting: Medical

## 2012-03-21 DIAGNOSIS — I4891 Unspecified atrial fibrillation: Secondary | ICD-10-CM

## 2012-03-21 DIAGNOSIS — D4620 Refractory anemia with excess of blasts, unspecified: Secondary | ICD-10-CM

## 2012-03-21 DIAGNOSIS — Z7901 Long term (current) use of anticoagulants: Secondary | ICD-10-CM

## 2012-03-21 LAB — CBC WITH DIFFERENTIAL (CANCER CENTER ONLY)
HCT: 32.8 % — ABNORMAL LOW (ref 38.7–49.9)
HGB: 10.6 g/dL — ABNORMAL LOW (ref 13.0–17.1)
MCH: 31.5 pg (ref 28.0–33.4)
MCHC: 32.3 g/dL (ref 32.0–35.9)
MCV: 98 fL (ref 82–98)
RDW: 13.6 % (ref 11.1–15.7)

## 2012-03-21 LAB — PROTIME-INR (CHCC SATELLITE): Protime: 28.8 Seconds — ABNORMAL HIGH (ref 10.6–13.4)

## 2012-03-21 LAB — IRON AND TIBC
%SAT: 73 % — ABNORMAL HIGH (ref 20–55)
Iron: 151 ug/dL (ref 42–165)
TIBC: 208 ug/dL — ABNORMAL LOW (ref 215–435)
UIBC: 57 ug/dL — ABNORMAL LOW (ref 125–400)

## 2012-03-21 LAB — MANUAL DIFFERENTIAL (CHCC SATELLITE)
ANC (CHCC HP manual diff): 2.2 10*3/uL (ref 1.5–6.5)
BASO: 1 % (ref 0–2)
LYMPH: 31 % (ref 14–48)
MONO: 24 % — ABNORMAL HIGH (ref 0–13)

## 2012-03-21 LAB — FERRITIN: Ferritin: 1297 ng/mL — ABNORMAL HIGH (ref 22–322)

## 2012-03-21 LAB — RETICULOCYTES (CHCC): Retic Ct Pct: 0.7 % (ref 0.4–2.3)

## 2012-03-21 NOTE — Progress Notes (Signed)
Diagnoses: #1.  Refractory anemia. #2.  Atrial fibrillation..  Current therapy: #1.  Aranesp 300 mcg subcutaneous as needed for hemoglobin less than 10. #2.  Coumadin-lifelong.  Interim history: Mr. Moultrie comes in for followup.  Overall, he, reports, that he is doing quite well.  He's not reporting any new problems.  He's not reported any new issues with congestive heart failure. he does have atrial fibrillation.  He is on Coumadin.   He's not reporting any other issues with his heart.  He follows up with Dr. Patty Sermons this coming Wednesday.  He's not reporting any chest pain, or shortness of breath.  He remains on Coumadin without any trouble.  He has not had to have an Aranesp injection and 3 years now.  He has a good appetite.  He denies any nausea, vomiting, diarrhea, constipation, chest pain, shortness breath, or cough.  He denies any fevers, chills, or night sweats.  He denies any lower leg swelling.  He denies any abdominal pain, or back pain.  He denies any headaches, visual changes, or rashes.  Review of Systems: Constitutional:Negative for malaise/fatigue, fever, chills, weight loss, diaphoresis, activity change, appetite change, and unexpected weight change.  HEENT: Negative for double vision, blurred vision, visual loss, ear pain, tinnitus, congestion, rhinorrhea, epistaxis sore throat or sinus disease, oral pain/lesion, tongue soreness Respiratory: Negative for cough, chest tightness, shortness of breath, wheezing and stridor.  Cardiovascular: Negative for chest pain, palpitations, leg swelling, orthopnea, PND, DOE or claudication Gastrointestinal: Negative for nausea, vomiting, abdominal pain, diarrhea, constipation, blood in stool, melena, hematochezia, abdominal distention, anal bleeding, rectal pain, anorexia and hematemesis.  Genitourinary: Negative for dysuria, frequency, hematuria,  Musculoskeletal: Negative for myalgias, back pain, joint swelling, arthralgias and gait problem.   Skin: Negative for rash, color change, pallor and wound.  Neurological:. Negative for dizziness/light-headedness, tremors, seizures, syncope, facial asymmetry, speech difficulty, weakness, numbness, headaches and paresthesias.  Hematological: Negative for adenopathy. Does not bruise/bleed easily.  Psychiatric/Behavioral:  Negative for depression, no loss of interest in normal activity or change in sleep pattern.   Physical Exam: This is a very pleasant, 78 year old, well-developed, well-nourished, white gentleman, in no obvious distress Vitals: Temperature 97.7 degrees, pulse 81, respirations 18, blood pressure 104/55.  Weight 222 pounds HEENT reveals a normocephalic, atraumatic skull, no scleral icterus, no oral lesions  Neck is supple without any cervical or supraclavicular adenopathy.  Lungs are clear to auscultation bilaterally. There are no wheezes, rales or rhonci Cardiac is regular rate and rhythm with a normal S1 and S2. There are no murmurs, rubs, or bruits.  Abdomen is soft with good bowel sounds, there is no palpable mass. There is no palpable hepatosplenomegaly. There is no palpable fluid wave.  Musculoskeletal no tenderness of the spine, ribs, or hips.  Extremities there are no clubbing, cyanosis, or edema.  Skin no petechia, purpura or ecchymosis Neurologic is nonfocal.  Laboratory Data: .  White count 5.0, hemoglobin 10.6, hematocrit 32.8, platelets 127,000   INR 2.4  Current Outpatient Prescriptions on File Prior to Visit  Medication Sig Dispense Refill  . alfuzosin (UROXATRAL) 10 MG 24 hr tablet Take 10 mg by mouth daily.        . carvedilol (COREG) 6.25 MG tablet TAKE ONE TABLET BY MOUTH TWICE DAILY  180 tablet  2  . famotidine (PEPCID) 20 MG tablet Take 20 mg by mouth 2 (two) times daily.        . finasteride (PROSCAR) 5 MG tablet Take 5 mg by mouth daily.        Marland Kitchen  furosemide (LASIX) 40 MG tablet TAKE ONE TABLET BY MOUTH EVERY DAY  90 tablet  2  .  Hydrocodone-Acetaminophen 5-300 MG TABS       . Multiple Vitamin (MULTI-VITAMIN PO) Take by mouth daily.        Marland Kitchen pyridoxine (B-6) 100 MG tablet Take 100 mg by mouth 2 (two) times daily.        Marland Kitchen warfarin (COUMADIN) 5 MG tablet Take 5 mg by mouth daily.       No current facility-administered medications on file prior to visit.   Assessment/Plan: This is a pleasant, 79 year old, gentleman, with the following issues:  #1.  Refractory anemia.  He has done incredibly well with anemia.  Again, we've not had to give him any treatment for this in over 3 years.  #2.  Atrial fibrillation.  He is on Coumadin.  He is therapeutic.  He follows up with his cardiologist.  #3.  Followup.  We will follow back up with Mr. Gilbo about 2 months, but before then should there be questions or concerns.

## 2012-03-23 ENCOUNTER — Encounter: Payer: Self-pay | Admitting: Cardiology

## 2012-03-23 ENCOUNTER — Ambulatory Visit (HOSPITAL_COMMUNITY): Payer: Medicare Other | Attending: Cardiology | Admitting: Radiology

## 2012-03-23 ENCOUNTER — Ambulatory Visit (INDEPENDENT_AMBULATORY_CARE_PROVIDER_SITE_OTHER): Payer: Medicare Other | Admitting: Cardiology

## 2012-03-23 VITALS — BP 110/68 | HR 90 | Ht 72.0 in | Wt 220.0 lb

## 2012-03-23 DIAGNOSIS — I5022 Chronic systolic (congestive) heart failure: Secondary | ICD-10-CM | POA: Insufficient documentation

## 2012-03-23 DIAGNOSIS — I509 Heart failure, unspecified: Secondary | ICD-10-CM

## 2012-03-23 DIAGNOSIS — I4891 Unspecified atrial fibrillation: Secondary | ICD-10-CM | POA: Insufficient documentation

## 2012-03-23 NOTE — Assessment & Plan Note (Signed)
The patient has not been experiencing any chest pain or exertional dyspnea.  He has only occasional mild ankle edema.  Echocardiogram today results pending

## 2012-03-23 NOTE — Progress Notes (Signed)
Echocardiogram performed.  

## 2012-03-23 NOTE — Assessment & Plan Note (Signed)
The patient has not been experiencing any TIA symptoms.  Denies any problems with his Coumadin.

## 2012-03-23 NOTE — Patient Instructions (Addendum)
Your physician wants you to follow-up in: 4 months You will receive a reminder letter in the mail two months in advance. If you don't receive a letter, please call our office to schedule the follow-up appointment.     .Your physician recommends that you continue on your current medications as directed. Please refer to the Current Medication list given to you today.  

## 2012-03-23 NOTE — Progress Notes (Signed)
[name] Date of Birth:  02-Aug-1927 [facility] [address] [place], [place]  [address] [phone]  Fax   [phone]  HPI: This pleasant 79 year old gentleman is seen for a six-month followup office visit. He has a history of chronic congestive heart failure. He was hospitalized for this at [facility] in 2009. He has a past history of established atrial fibrillation and had an ablation of his AV node with implantation of a biventricular pacemaker on 12/28/06 by Dr. Luella Cook at [facility].  He was part of a research study however and the biventricular function was not turned on until recently when he switched his care to our office after the conclusion of the research study. The patient has been on chronic Coumadin for his atrial fibrillation.  The patient has a history of depressed left ventricular function.  His previous ejection fraction was 45-50%.  A repeat echo was done today to see if turning on the biventricular function has made any difference.  Current Outpatient Prescriptions  Medication Sig Dispense Refill  . alfuzosin (UROXATRAL) 10 MG 24 hr tablet Take 10 mg by mouth daily.        . carvedilol (COREG) 6.25 MG tablet TAKE ONE TABLET BY MOUTH TWICE DAILY  180 tablet  2  . famotidine (PEPCID) 20 MG tablet Take 20 mg by mouth 2 (two) times daily.        . finasteride (PROSCAR) 5 MG tablet Take 5 mg by mouth daily.        . furosemide (LASIX) 40 MG tablet TAKE ONE TABLET BY MOUTH EVERY DAY  90 tablet  2  . Multiple Vitamin (MULTI-VITAMIN PO) Take by mouth daily.        Marland Kitchen pyridoxine (B-6) 100 MG tablet Take 100 mg by mouth 2 (two) times daily.        Marland Kitchen warfarin (COUMADIN) 5 MG tablet Take 5 mg by mouth daily.       No current facility-administered medications for this visit.    Allergies  Allergen Reactions  . Moxifloxacin Hcl In Nacl Other (See Comments)    Pt c/o being "out of his head" & itching  . Ramipril     Renal function  worsened on ACEi.    Patient Active Problem List  Diagnosis  . Atrial fibrillation with controlled ventricular response  . Chronic systolic congestive heart failure  . Pacemaker  . Shoulder impingement syndrome  . MDS (myelodysplastic syndrome), low grade  . Iron overload, transfusional  . Complete heart block    History  Smoking status  . Former Smoker  Smokeless tobacco  . Not on file    History  Alcohol Use No    Family History  Problem Relation Age of Onset  . Cancer Mother     Review of Systems: The patient denies any heat or cold intolerance.  No weight gain or weight loss.  The patient denies headaches or blurry vision.  There is no cough or sputum production.  The patient denies dizziness.  There is no hematuria or hematochezia.  The patient denies any muscle aches or arthritis.  The patient denies any rash.  The patient denies frequent falling or instability.  There is no history of depression or anxiety.  All other systems were reviewed and are negative.   Physical Exam: Filed Vitals:   03/23/12 1013  BP: 110/68  Pulse: 90   the general appearance reveals a well-developed well-nourished elderly gentleman in no distress.The head  and neck exam reveals pupils equal and reactive.  Extraocular movements are full.  There is no scleral icterus.  The mouth and pharynx are normal.  The neck is supple.  The carotids reveal no bruits.  The jugular venous pressure is normal.  The  thyroid is not enlarged.  There is no lymphadenopathy.  The chest is clear to percussion and auscultation.  There are no rales or rhonchi.  Expansion of the chest is symmetrical.  The precordium is quiet.  The pulse is regular and paced. The first heart sound is normal.  The second heart sound is physiologically split.  There is no murmur gallop rub or click.  There is no abnormal lift or heave.  The abdomen is soft and nontender.  The bowel sounds are normal.  The liver and spleen are not enlarged.   There are no abdominal masses.  There are no abdominal bruits.  Extremities reveal good pedal pulses.  There is no phlebitis or edema.  There is no cyanosis or clubbing.  Strength is normal and symmetrical in all extremities.  There is no lateralizing weakness.  There are no sensory deficits.  The skin is warm and dry.  There is no rash.      Assessment / Plan: Continue same medication.  Recheck in 4 months for followup office visit and EKG.

## 2012-05-23 ENCOUNTER — Ambulatory Visit: Payer: Medicare Other

## 2012-05-23 ENCOUNTER — Ambulatory Visit (HOSPITAL_BASED_OUTPATIENT_CLINIC_OR_DEPARTMENT_OTHER): Payer: Medicare Other | Admitting: Hematology & Oncology

## 2012-05-23 ENCOUNTER — Other Ambulatory Visit (HOSPITAL_BASED_OUTPATIENT_CLINIC_OR_DEPARTMENT_OTHER): Payer: Medicare Other | Admitting: Lab

## 2012-05-23 VITALS — BP 118/64 | HR 87 | Temp 98.1°F | Resp 18 | Ht 72.0 in | Wt 218.0 lb

## 2012-05-23 DIAGNOSIS — D4620 Refractory anemia with excess of blasts, unspecified: Secondary | ICD-10-CM

## 2012-05-23 DIAGNOSIS — I4891 Unspecified atrial fibrillation: Secondary | ICD-10-CM

## 2012-05-23 LAB — MANUAL DIFFERENTIAL (CHCC SATELLITE)
ALC: 2.1 10*3/uL (ref 0.9–3.3)
PLT EST ~~LOC~~: DECREASED
SEG: 37 % — ABNORMAL LOW (ref 40–75)

## 2012-05-23 LAB — CBC WITH DIFFERENTIAL (CANCER CENTER ONLY)
MCH: 31.5 pg (ref 28.0–33.4)
MCHC: 31.9 g/dL — ABNORMAL LOW (ref 32.0–35.9)
MCV: 99 fL — ABNORMAL HIGH (ref 82–98)
Platelets: 124 10*3/uL — ABNORMAL LOW (ref 145–400)

## 2012-05-23 LAB — IRON AND TIBC
%SAT: 79 % — ABNORMAL HIGH (ref 20–55)
TIBC: 164 ug/dL — ABNORMAL LOW (ref 215–435)
UIBC: 35 ug/dL — ABNORMAL LOW (ref 125–400)

## 2012-05-23 LAB — PROTIME-INR (CHCC SATELLITE): Protime: 37.2 Seconds — ABNORMAL HIGH (ref 10.6–13.4)

## 2012-05-23 LAB — FERRITIN: Ferritin: 1077 ng/mL — ABNORMAL HIGH (ref 22–322)

## 2012-05-23 NOTE — Progress Notes (Signed)
This office note has been dictated.
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
## 2012-06-21 ENCOUNTER — Other Ambulatory Visit: Payer: Self-pay | Admitting: Hematology & Oncology

## 2012-07-15 ENCOUNTER — Other Ambulatory Visit: Payer: Self-pay | Admitting: Cardiology

## 2012-07-20 ENCOUNTER — Ambulatory Visit: Payer: Medicare Other

## 2012-07-20 ENCOUNTER — Ambulatory Visit (HOSPITAL_BASED_OUTPATIENT_CLINIC_OR_DEPARTMENT_OTHER): Payer: Medicare Other | Admitting: Hematology & Oncology

## 2012-07-20 ENCOUNTER — Other Ambulatory Visit (HOSPITAL_BASED_OUTPATIENT_CLINIC_OR_DEPARTMENT_OTHER): Payer: Medicare Other | Admitting: Lab

## 2012-07-20 VITALS — BP 110/57 | HR 89 | Temp 97.9°F | Resp 18 | Ht 72.0 in | Wt 217.0 lb

## 2012-07-20 DIAGNOSIS — I4891 Unspecified atrial fibrillation: Secondary | ICD-10-CM

## 2012-07-20 DIAGNOSIS — D4620 Refractory anemia with excess of blasts, unspecified: Secondary | ICD-10-CM

## 2012-07-20 LAB — CBC WITH DIFFERENTIAL (CANCER CENTER ONLY)
HCT: 34.4 % — ABNORMAL LOW (ref 38.7–49.9)
MCH: 31.8 pg (ref 28.0–33.4)
MCV: 97 fL (ref 82–98)
Platelets: 122 10*3/uL — ABNORMAL LOW (ref 145–400)
RDW: 13.4 % (ref 11.1–15.7)
WBC: 4.5 10*3/uL (ref 4.0–10.0)

## 2012-07-20 LAB — IRON AND TIBC CHCC
Iron: 170 ug/dL — ABNORMAL HIGH (ref 42–163)
TIBC: 188 ug/dL — ABNORMAL LOW (ref 202–409)
UIBC: 18 ug/dL — ABNORMAL LOW (ref 117–376)

## 2012-07-20 LAB — MANUAL DIFFERENTIAL (CHCC SATELLITE)
ALC: 1.7 10*3/uL (ref 0.9–3.3)
ANC (CHCC HP manual diff): 1.8 10*3/uL (ref 1.5–6.5)
Eos: 2 % (ref 0–7)
MONO: 20 % — ABNORMAL HIGH (ref 0–13)
PLT EST ~~LOC~~: DECREASED

## 2012-07-20 LAB — PROTIME-INR (CHCC SATELLITE)

## 2012-07-20 NOTE — Progress Notes (Signed)
No Aranesp today per dr. Myna Hidalgo

## 2012-07-20 NOTE — Progress Notes (Signed)
This office note has been dictated.

## 2012-07-21 ENCOUNTER — Encounter: Payer: Self-pay | Admitting: Cardiology

## 2012-07-21 ENCOUNTER — Ambulatory Visit (INDEPENDENT_AMBULATORY_CARE_PROVIDER_SITE_OTHER): Payer: Medicare Other | Admitting: Cardiology

## 2012-07-21 VITALS — BP 110/86 | HR 82 | Ht 74.0 in | Wt 217.4 lb

## 2012-07-21 DIAGNOSIS — D46Z Other myelodysplastic syndromes: Secondary | ICD-10-CM

## 2012-07-21 DIAGNOSIS — I509 Heart failure, unspecified: Secondary | ICD-10-CM

## 2012-07-21 DIAGNOSIS — D4620 Refractory anemia with excess of blasts, unspecified: Secondary | ICD-10-CM

## 2012-07-21 DIAGNOSIS — I5022 Chronic systolic (congestive) heart failure: Secondary | ICD-10-CM

## 2012-07-21 DIAGNOSIS — I4891 Unspecified atrial fibrillation: Secondary | ICD-10-CM

## 2012-07-21 NOTE — Assessment & Plan Note (Signed)
The patient has not been having orthopnea or paroxysmal nocturnal dyspnea.  He is not having any significant exertional dyspnea.  Energy level is good.

## 2012-07-21 NOTE — Progress Notes (Signed)
[name] Date of Birth:  1928-01-16 [facility] [address] [place], [place]  [address] [phone]  Fax   [phone]  HPI: This pleasant 79 year old gentleman is seen for a scheduled followup office visit. He has a history of chronic congestive heart failure. He was hospitalized for this at [facility] in 2009. He has a past history of established atrial fibrillation and had an ablation of his AV node with implantation of a biventricular pacemaker on 12/28/06 by Dr. Luella Cook at [facility]. He was part of a research study however and the biventricular function was not turned on until recently when he switched his care to our office after the conclusion of the research study. The patient has been on chronic Coumadin for his atrial fibrillation. The patient has a history of depressed left ventricular function. His previous ejection fraction was 45-50%. A repeat echo was done on 03/23/12 to see if turning on the biventricular function has made any difference.  Unfortunately the ejection fraction is 25%.  Nonetheless the patient feels well and states he feels better than he has in the past several years.  He is active around the house and enjoys gardening.   Current Outpatient Prescriptions  Medication Sig Dispense Refill  . alfuzosin (UROXATRAL) 10 MG 24 hr tablet Take 10 mg by mouth daily.        . carvedilol (COREG) 6.25 MG tablet TAKE ONE TABLET BY MOUTH TWICE DAILY  180 tablet  0  . famotidine (PEPCID) 20 MG tablet Take 20 mg by mouth 2 (two) times daily.        . finasteride (PROSCAR) 5 MG tablet Take 5 mg by mouth daily.        . furosemide (LASIX) 40 MG tablet TAKE ONE TABLET BY MOUTH EVERY DAY  90 tablet  2  . Multiple Vitamin (MULTI-VITAMIN PO) Take by mouth daily.        Marland Kitchen pyridoxine (B-6) 100 MG tablet Take 100 mg by mouth 2 (two) times daily.        Marland Kitchen warfarin (COUMADIN) 5 MG tablet Take 5 mg by mouth daily.       No current  facility-administered medications for this visit.    Allergies  Allergen Reactions  . Moxifloxacin Hcl In Nacl Other (See Comments)    Pt c/o being "out of his head" & itching  . Ramipril     Renal function worsened on ACEi.    Patient Active Problem List   Diagnosis Date Noted  . Complete heart block 09/12/2011  . MDS (myelodysplastic syndrome), low grade 02/10/2011  . Iron overload, transfusional 02/10/2011  . Shoulder impingement syndrome 12/23/2010  . Atrial fibrillation with controlled ventricular response 09/15/2010  . Chronic systolic congestive heart failure 09/15/2010  . Pacemaker 09/15/2010    History  Smoking status  . Former Smoker  Smokeless tobacco  . Not on file    History  Alcohol Use No    Family History  Problem Relation Age of Onset  . Cancer Mother     Review of Systems: The patient denies any heat or cold intolerance.  No weight gain or weight loss.  The patient denies headaches or blurry vision.  There is no cough or sputum production.  The patient denies dizziness.  There is no hematuria or hematochezia.  The patient denies any muscle aches or arthritis.  The patient denies any rash.  The patient denies frequent falling or instability.  There is no history  of depression or anxiety.  All other systems were reviewed and are negative.   Physical Exam: Filed Vitals:   07/21/12 0905  BP: 110/86  Pulse: 82   the general appearance reveals a well-developed well-nourished gentleman in no distress.The head and neck exam reveals pupils equal and reactive.  Extraocular movements are full.  There is no scleral icterus.  The mouth and pharynx are normal.  The neck is supple.  The carotids reveal no bruits.  The jugular venous pressure is normal.  The  thyroid is not enlarged.  There is no lymphadenopathy.  The chest is clear to percussion and auscultation.  There are no rales or rhonchi.  Expansion of the chest is symmetrical.  The precordium is quiet.  The  first heart sound is normal.  The second heart sound is physiologically split.  There is no murmur gallop rub or click.  There is no abnormal lift or heave.  The abdomen is soft and nontender.  The bowel sounds are normal.  The liver and spleen are not enlarged.  There are no abdominal masses.  There are no abdominal bruits.  Extremities reveal good pedal pulses.  There is no phlebitis or edema.  There is no cyanosis or clubbing.  Strength is normal and symmetrical in all extremities.  There is no lateralizing weakness.  There are no sensory deficits.  The skin is warm and dry.  There is no rash.   EKG shows atrial fibrillation with paced ventricular response   Assessment / Plan: The patient is doing well.  His weight is down 3 pounds.  No new cardiac symptoms.  Continue same medication and be rechecked in 4 months

## 2012-07-21 NOTE — Assessment & Plan Note (Signed)
The patient reports that he got a good checkup hematologist/oncologist Dr.Ennever yesterday.

## 2012-07-21 NOTE — Patient Instructions (Signed)
Your physician recommends that you continue on your current medications as directed. Please refer to the Current Medication list given to you today.  Your physician wants you to follow-up in: 4 month ov You will receive a reminder letter in the mail two months in advance. If you don't receive a letter, please call our office to schedule the follow-up appointment.  

## 2012-07-21 NOTE — Assessment & Plan Note (Signed)
Patient remains on long-term Coumadin for his underlying atrial fibrillation.  EKG today shows atrial fibrillation with a paced ventricular response at 82 beats per minute.

## 2012-07-21 NOTE — Progress Notes (Signed)
CC:   [name], M.D. [name], M.D.  DIAGNOSES: 1. Refractory anemia. 2. Atrial ablation.  CURRENT THERAPY: 1. Aranesp 300 mcg subcu as needed for hemoglobin less than 10. 2. Coumadin, lifelong.  INTERIM HISTORY:  [name] comes in for his followup.  He is doing well.  He has had no problems since I last saw him back in May.  He is doing good on the Coumadin.  He has had a problem with the atrial fibrillation.  He has had no bleeding.  He has had no change in bowel or bladder habits.  There has been no cough or shortness of breath.  We are following his iron levels.  Back in May, his ferritin was 1077 with an iron saturation of 79%.  We are just watching this.  He has not required any Aranesp now probably for over 1 or 2 years.  PHYSICAL EXAMINATION:  General:  This is a well-developed, well- nourished white gentleman in no obvious distress.  Vital Signs: Temperature of 97.9, pulse 89, respiratory rate 18, blood pressure 110/57.  Weight is 217 pounds.  Head and Neck:  Normocephalic, atraumatic skull.  There are no ocular or oral lesions.  There are no palpable cervical or supraclavicular lymph nodes.  Lungs:  Clear bilaterally.  Cardiac:  Regular rate and rhythm with an occasional extra beat.  There are no murmurs, rubs, or bruits.  Abdomen:  Soft with good bowel sounds.  There is no palpable abdominal mass.  There is no fluid wave.  There is no palpable hepatosplenomegaly.  Extremities:  No clubbing, cyanosis or edema.  Neurological:  No focal neurological deficits.  LABORATORY STUDIES:  White cell count is 4.5, hemoglobin 11.3, hematocrit 34.4, platelet count 122.  MCV is 97.  IMPRESSION:  [name] is a nice 79 year old white gentleman who definitely looks younger.  He is very active.  He has done very well. He is being followed supportively.  Again, he does not need any Aranesp.  I think we can probably get him back in 2-3 months' time.  We can get him  back after Labor Day.  We need to check his Coumadin.  His INR today was 2.2.    ______________________________ Josph Macho, M.D. PRE/MEDQ  D:  07/20/2012  T:  07/21/2012  Job:  [id number]

## 2012-08-08 ENCOUNTER — Other Ambulatory Visit: Payer: Self-pay | Admitting: Cardiology

## 2012-08-09 ENCOUNTER — Ambulatory Visit (HOSPITAL_BASED_OUTPATIENT_CLINIC_OR_DEPARTMENT_OTHER): Payer: Medicare Other | Admitting: Lab

## 2012-08-09 DIAGNOSIS — I4891 Unspecified atrial fibrillation: Secondary | ICD-10-CM

## 2012-08-10 ENCOUNTER — Other Ambulatory Visit: Payer: Self-pay | Admitting: *Deleted

## 2012-09-14 ENCOUNTER — Ambulatory Visit (INDEPENDENT_AMBULATORY_CARE_PROVIDER_SITE_OTHER): Payer: Medicare Other | Admitting: *Deleted

## 2012-09-14 DIAGNOSIS — I442 Atrioventricular block, complete: Secondary | ICD-10-CM

## 2012-09-14 DIAGNOSIS — I5022 Chronic systolic (congestive) heart failure: Secondary | ICD-10-CM

## 2012-09-14 DIAGNOSIS — I509 Heart failure, unspecified: Secondary | ICD-10-CM

## 2012-09-14 DIAGNOSIS — I4891 Unspecified atrial fibrillation: Secondary | ICD-10-CM

## 2012-09-14 LAB — PACEMAKER DEVICE OBSERVATION

## 2012-09-14 NOTE — Progress Notes (Signed)
PPM check in clinic 

## 2012-09-21 ENCOUNTER — Ambulatory Visit (HOSPITAL_BASED_OUTPATIENT_CLINIC_OR_DEPARTMENT_OTHER): Payer: Medicare Other | Admitting: Hematology & Oncology

## 2012-09-21 ENCOUNTER — Other Ambulatory Visit (HOSPITAL_BASED_OUTPATIENT_CLINIC_OR_DEPARTMENT_OTHER): Payer: Medicare Other | Admitting: Lab

## 2012-09-21 VITALS — BP 109/58 | HR 86 | Temp 97.7°F | Resp 18 | Ht 74.0 in | Wt 219.0 lb

## 2012-09-21 DIAGNOSIS — D4620 Refractory anemia with excess of blasts, unspecified: Secondary | ICD-10-CM

## 2012-09-21 DIAGNOSIS — Z7901 Long term (current) use of anticoagulants: Secondary | ICD-10-CM

## 2012-09-21 DIAGNOSIS — I4891 Unspecified atrial fibrillation: Secondary | ICD-10-CM

## 2012-09-21 LAB — MANUAL DIFFERENTIAL (CHCC SATELLITE)
LYMPH: 35 % (ref 14–48)
MONO: 23 % — ABNORMAL HIGH (ref 0–13)
PLT EST ~~LOC~~: DECREASED

## 2012-09-21 LAB — CBC WITH DIFFERENTIAL (CANCER CENTER ONLY)
HCT: 32.9 % — ABNORMAL LOW (ref 38.7–49.9)
HGB: 10.9 g/dL — ABNORMAL LOW (ref 13.0–17.1)
MCHC: 33.1 g/dL (ref 32.0–35.9)

## 2012-09-21 LAB — CHCC SATELLITE - SMEAR

## 2012-09-21 LAB — PROTIME-INR (CHCC SATELLITE): INR: 2.6 (ref 2.0–3.5)

## 2012-09-21 LAB — IRON AND TIBC CHCC
Iron: 135 ug/dL (ref 42–163)
TIBC: 189 ug/dL — ABNORMAL LOW (ref 202–409)

## 2012-09-21 NOTE — Progress Notes (Signed)
This office note has been dictated.

## 2012-09-22 NOTE — Progress Notes (Signed)
CC:   Gabriel Earing, M.D. Cassell Clement, M.D.  DIAGNOSES: 1. Refractory anemia-low grade. 2. Atrial fibrillation.  CURRENT THERAPY: 1. Aranesp 300 mcg subcu as needed for hemoglobin less than 10. 2. Coumadin-lifelong for atrial fibrillation.  INTERIM HISTORY:  [name] comes in for his followup.  He is doing fairly well.  He has had no real complaints since we last saw him.  He continues on his Coumadin.  He has had no bleeding.  He has had no change in Coumadin dosing.  He takes 5 mg a day.  He has had no change in bowel or bladder habits.  He has had no cough. He has had no shortness of breath.  There has been no leg swelling.  There have been no rashes.  He has a pacemaker in.  This was last checked, I think, just a couple weeks ago, and everything seemed fine.  PHYSICAL EXAMINATION:  General:  This is a well-developed, well- nourished white gentleman in no obvious distress.  Vital Signs:  Show a temperature of 97.7, pulse 86, respiratory rate 18, blood pressure is 109/58.  Weight is 219.  Head and Neck Exam:  Shows a normocephalic, atraumatic skull.  There are no ocular or oral lesions.  There are no palpable cervical or supraclavicular lymph nodes.  Lungs:  Clear bilaterally.  Cardiac Exam:  Regular rate and rhythm with a normal S1, S2.  There is 1/6 systolic ejection murmur.  Abdomen:  Soft.  He has good bowel sounds.  There is no fluid wave.  There is no palpable hepatosplenomegaly.  Extremities:  Show no clubbing, cyanosis or edema. Neurological Exam:  Shows no focal neurological deficits.  Skin Exam: Shows no rashes, ecchymosis or petechia.  LABORATORY STUDIES:  White cell count is 5.1, hemoglobin 10.9, hematocrit 32.9, platelet count 133.  IMPRESSION:  [name] is an 79 year old gentleman with refractory anemia.  He has done incredibly well with this.  He does not need any Aranesp at this point.  We will continue monitoring him every couple of  months.    ______________________________ Josph Macho, M.D. PRE/MEDQ  D:  09/21/2012  T:  09/22/2012  Job:  [id number]

## 2012-09-24 ENCOUNTER — Encounter: Payer: Self-pay | Admitting: Internal Medicine

## 2012-09-30 ENCOUNTER — Other Ambulatory Visit: Payer: Self-pay | Admitting: Nephrology

## 2012-09-30 ENCOUNTER — Encounter: Payer: Self-pay | Admitting: Cardiology

## 2012-10-05 ENCOUNTER — Ambulatory Visit
Admission: RE | Admit: 2012-10-05 | Discharge: 2012-10-05 | Disposition: A | Discharge status: Home / Self Care | Payer: Medicare Other | Source: Ambulatory Visit | Attending: Nephrology | Admitting: Nephrology

## 2012-10-06 ENCOUNTER — Telehealth: Payer: Self-pay | Admitting: *Deleted

## 2012-10-10 ENCOUNTER — Other Ambulatory Visit: Payer: Self-pay | Admitting: Hematology & Oncology

## 2012-10-14 ENCOUNTER — Other Ambulatory Visit: Payer: Self-pay

## 2012-10-14 MED ORDER — CARVEDILOL 6.25 MG PO TABS: ORAL_TABLET | ORAL | Status: DC

## 2012-11-04 ENCOUNTER — Encounter: Payer: Self-pay | Admitting: Cardiology

## 2012-11-10 ENCOUNTER — Other Ambulatory Visit: Payer: Self-pay

## 2012-11-10 MED ORDER — FUROSEMIDE 40 MG PO TABS: ORAL_TABLET | ORAL | Status: DC

## 2012-11-18 ENCOUNTER — Telehealth: Payer: Self-pay | Admitting: Hematology & Oncology

## 2012-11-18 NOTE — Telephone Encounter (Signed)
Faxed Medical Records via fax today to:  [facility] Ph: [phone] Fx: [phone]   CONSENT COPY SCANNED

## 2012-12-19 ENCOUNTER — Telehealth: Payer: Self-pay | Admitting: Hematology & Oncology

## 2012-12-19 NOTE — Telephone Encounter (Signed)
Pt moved 12-3 to 1-7 said he was weak. I left message on RN line  if they thought they should triage him

## 2012-12-21 ENCOUNTER — Other Ambulatory Visit: Payer: Medicare Other | Admitting: Lab

## 2012-12-21 ENCOUNTER — Ambulatory Visit: Payer: Medicare Other | Admitting: Hematology & Oncology

## 2013-01-25 ENCOUNTER — Ambulatory Visit (HOSPITAL_BASED_OUTPATIENT_CLINIC_OR_DEPARTMENT_OTHER): Payer: Medicare Other

## 2013-01-25 ENCOUNTER — Ambulatory Visit (HOSPITAL_BASED_OUTPATIENT_CLINIC_OR_DEPARTMENT_OTHER): Payer: Medicare Other | Admitting: Hematology & Oncology

## 2013-01-25 ENCOUNTER — Other Ambulatory Visit (HOSPITAL_BASED_OUTPATIENT_CLINIC_OR_DEPARTMENT_OTHER): Payer: Medicare Other | Admitting: Lab

## 2013-01-25 VITALS — BP 97/57 | HR 87 | Temp 98.1°F | Resp 18 | Ht 72.0 in | Wt 212.0 lb

## 2013-01-25 DIAGNOSIS — D4620 Refractory anemia with excess of blasts, unspecified: Secondary | ICD-10-CM

## 2013-01-25 DIAGNOSIS — D46Z Other myelodysplastic syndromes: Secondary | ICD-10-CM

## 2013-01-25 DIAGNOSIS — Z7901 Long term (current) use of anticoagulants: Secondary | ICD-10-CM

## 2013-01-25 DIAGNOSIS — I4891 Unspecified atrial fibrillation: Secondary | ICD-10-CM

## 2013-01-25 LAB — MANUAL DIFFERENTIAL (CHCC SATELLITE)
ALC: 1.8 10*3/uL (ref 0.9–3.3)
ANC (CHCC MAN DIFF): 3.6 10*3/uL (ref 1.5–6.5)
BASO: 1 % (ref 0–2)
Eos: 1 % (ref 0–7)
LYMPH: 26 % (ref 14–48)
MONO: 20 % — AB (ref 0–13)
Myelocytes: 1 % — ABNORMAL HIGH (ref 0–0)
PLT EST ~~LOC~~: ADEQUATE
SEG: 51 % (ref 40–75)

## 2013-01-25 LAB — CBC WITH DIFFERENTIAL (CANCER CENTER ONLY)
HCT: 30.3 % — ABNORMAL LOW (ref 38.7–49.9)
HEMOGLOBIN: 9.6 g/dL — AB (ref 13.0–17.1)
MCH: 31 pg (ref 28.0–33.4)
MCHC: 31.7 g/dL — ABNORMAL LOW (ref 32.0–35.9)
MCV: 98 fL (ref 82–98)
PLATELETS: 204 10*3/uL (ref 145–400)
RBC: 3.1 10*6/uL — AB (ref 4.20–5.70)
RDW: 15.5 % (ref 11.1–15.7)
WBC: 6.9 10*3/uL (ref 4.0–10.0)

## 2013-01-25 LAB — IRON AND TIBC CHCC
%SAT: 94 % — AB (ref 20–55)
IRON: 156 ug/dL (ref 42–163)
TIBC: 165 ug/dL — AB (ref 202–409)
UIBC: 9 ug/dL — ABNORMAL LOW (ref 117–376)

## 2013-01-25 LAB — PROTIME-INR (CHCC SATELLITE)
INR: 2.1 (ref 2.0–3.5)
PROTIME: 25.2 s — AB (ref 10.6–13.4)

## 2013-01-25 LAB — RETICULOCYTES (CHCC)
ABS Retic: 25.8 10*3/uL (ref 19.0–186.0)
RBC.: 3.23 MIL/uL — ABNORMAL LOW (ref 4.22–5.81)
Retic Ct Pct: 0.8 % (ref 0.4–2.3)

## 2013-01-25 LAB — FERRITIN CHCC: Ferritin: 1924 ng/ml — ABNORMAL HIGH (ref 22–316)

## 2013-01-25 MED ORDER — DARBEPOETIN ALFA-POLYSORBATE 300 MCG/0.6ML IJ SOLN
300.0000 ug | Freq: Once | INTRAMUSCULAR | Status: AC
  Administered 2013-01-25: 300 ug via SUBCUTANEOUS

## 2013-01-25 MED ORDER — DARBEPOETIN ALFA-POLYSORBATE 300 MCG/0.6ML IJ SOLN
INTRAMUSCULAR | Status: AC
  Filled 2013-01-25: qty 0.6

## 2013-01-25 NOTE — Progress Notes (Signed)
This office note has been dictated.

## 2013-01-26 NOTE — Progress Notes (Signed)
CC:   Gwenlyn Perking, M.D. Newt Minion, MD  DIAGNOSES: 1. Refractory anemia - low grade. 2. Atrial fibrillation.  CURRENT THERAPY: 1. Aranesp 300 mcg subcu as needed for hemoglobin less than 10. 2. Coumadin - lifelong for atrial fibrillation. 3. Foot surgery for the right foot secondary to gout.  INTERIM HISTORY:  Mr. [name] comes in for followup.  Unfortunately, he has had a tough time since we last saw him.  We usually see him every 3- 4 months.  He has his INR checked every 2 months.  Since I last saw him, he had foot surgery for his left foot.  He had gouty changes.  He also had pneumonia.  He had pneumonia right before Christmas time.  He still feels tired.  He is not coughing.  However, he still feels fatigued.  He is to go back next Tuesday for surgery on his right foot.  This is being done by Orthopedic Surgery.  He is having his Coumadin managed in the perioperative period by Dr. Mare Ferrari.  Overall, he has had no other issues.  When we last saw him in September, his ferritin was 1700 with an iron saturation of 71%.  We are just watching this right now.  He has had no bleeding.  He has had no change in bowel or bladder habits.  He has had no leg swelling.  He has had no rashes.  PHYSICAL EXAMINATION:  General:  This is an elderly, but well-nourished white gentleman in no obvious distress.  Vital Signs:  Temperature of 98.1, pulse 87, respiratory rate 18, blood pressure 97/57, weight is 212 pounds.  Head and Neck:  Normocephalic, atraumatic skull.  There are no ocular or oral lesions.  There are no palpable cervical or supraclavicular lymph nodes.  Lungs:  Clear bilaterally.  Cardiac: Regular rate and rhythm with a normal S1 and S2.  There are no murmurs, rubs, or bruits.  I do not hear much of the atrial fibrillation. Abdomen:  Soft.  He has good bowel sounds.  There is no palpable abdominal mass.  There is no palpable hepatosplenomegaly.  Back:  No tenderness  over the spine, ribs, or hips.  Extremities:  No clubbing, cyanosis, or edema.  He does have some gouty changes in his foot.  Skin: No rashes, ecchymosis, or petechia.  LABORATORY STUDIES:  White cell count 6.9, hemoglobin 9.6, hematocrit 30.3, platelet count 204.  MCV is 98.  Peripheral smear, he has normochromic/normocytic population of red blood cells.  There are no nucleated red blood cells.  I see no teardrop cells.  He has no immature myeloid cells.  He does have an increase in monocytes.  I do not see any blasts.  Platelets are adequate in number and size.  IMPRESSION:  Mr. [name] is an 80 year old gentleman with refractory anemia.  He has really low-grade disease.  We have not given him Aranesp now probably for a good 2 to 3 years.  We clearly have to give Aranesp now.  With his surgery in a month, this suppresses bone marrow.  We will go ahead and give him 300 mcg today.  I will then plan to get him back in 4 weeks.  I want to make sure that we continue to be aggressive with his anemia.    ______________________________ [name], M.D. PRE/MEDQ  D:  01/25/2013  T:  01/26/2013  Job:  [id number]

## 2013-02-05 ENCOUNTER — Observation Stay (HOSPITAL_COMMUNITY)
Admission: EM | Admit: 2013-02-05 | Discharge: 2013-02-07 | Disposition: A | Discharge status: Home / Self Care | Payer: Medicare Other | Attending: Internal Medicine | Admitting: Internal Medicine

## 2013-02-05 ENCOUNTER — Emergency Department (HOSPITAL_COMMUNITY): Payer: Medicare Other

## 2013-02-05 ENCOUNTER — Encounter (HOSPITAL_COMMUNITY): Payer: Self-pay | Admitting: Emergency Medicine

## 2013-02-05 DIAGNOSIS — R270 Ataxia, unspecified: Secondary | ICD-10-CM | POA: Diagnosis present

## 2013-02-05 DIAGNOSIS — I509 Heart failure, unspecified: Secondary | ICD-10-CM

## 2013-02-05 DIAGNOSIS — D649 Anemia, unspecified: Secondary | ICD-10-CM | POA: Insufficient documentation

## 2013-02-05 DIAGNOSIS — G459 Transient cerebral ischemic attack, unspecified: Secondary | ICD-10-CM | POA: Diagnosis present

## 2013-02-05 DIAGNOSIS — K219 Gastro-esophageal reflux disease without esophagitis: Secondary | ICD-10-CM | POA: Insufficient documentation

## 2013-02-05 DIAGNOSIS — I442 Atrioventricular block, complete: Secondary | ICD-10-CM | POA: Insufficient documentation

## 2013-02-05 DIAGNOSIS — Z8701 Personal history of pneumonia (recurrent): Secondary | ICD-10-CM | POA: Insufficient documentation

## 2013-02-05 DIAGNOSIS — N183 Chronic kidney disease, stage 3 unspecified: Secondary | ICD-10-CM | POA: Insufficient documentation

## 2013-02-05 DIAGNOSIS — Z87891 Personal history of nicotine dependence: Secondary | ICD-10-CM | POA: Insufficient documentation

## 2013-02-05 DIAGNOSIS — Z79899 Other long term (current) drug therapy: Secondary | ICD-10-CM | POA: Insufficient documentation

## 2013-02-05 DIAGNOSIS — R279 Unspecified lack of coordination: Principal | ICD-10-CM | POA: Insufficient documentation

## 2013-02-05 DIAGNOSIS — Z950 Presence of cardiac pacemaker: Secondary | ICD-10-CM | POA: Insufficient documentation

## 2013-02-05 DIAGNOSIS — I4891 Unspecified atrial fibrillation: Secondary | ICD-10-CM | POA: Insufficient documentation

## 2013-02-05 DIAGNOSIS — I5022 Chronic systolic (congestive) heart failure: Secondary | ICD-10-CM

## 2013-02-05 DIAGNOSIS — Z7901 Long term (current) use of anticoagulants: Secondary | ICD-10-CM | POA: Insufficient documentation

## 2013-02-05 DIAGNOSIS — M7150 Other bursitis, not elsewhere classified, unspecified site: Secondary | ICD-10-CM | POA: Insufficient documentation

## 2013-02-05 DIAGNOSIS — D4620 Refractory anemia with excess of blasts, unspecified: Secondary | ICD-10-CM | POA: Insufficient documentation

## 2013-02-05 DIAGNOSIS — H9319 Tinnitus, unspecified ear: Secondary | ICD-10-CM | POA: Insufficient documentation

## 2013-02-05 DIAGNOSIS — R9439 Abnormal result of other cardiovascular function study: Secondary | ICD-10-CM | POA: Insufficient documentation

## 2013-02-05 HISTORY — DX: Personal history of other diseases of the digestive system: Z87.19

## 2013-02-05 HISTORY — DX: Gout, unspecified: M10.9

## 2013-02-05 HISTORY — DX: Unspecified osteoarthritis, unspecified site: M19.90

## 2013-02-05 HISTORY — DX: Calculus of kidney: N20.0

## 2013-02-05 LAB — CBC WITH DIFFERENTIAL/PLATELET
BASOS ABS: 0 10*3/uL (ref 0.0–0.1)
Basophils Relative: 0 % (ref 0–1)
EOS PCT: 1 % (ref 0–5)
Eosinophils Absolute: 0.1 10*3/uL (ref 0.0–0.7)
HCT: 35.4 % — ABNORMAL LOW (ref 39.0–52.0)
Hemoglobin: 11.5 g/dL — ABNORMAL LOW (ref 13.0–17.0)
LYMPHS PCT: 28 % (ref 12–46)
Lymphs Abs: 1.4 10*3/uL (ref 0.7–4.0)
MCH: 31.2 pg (ref 26.0–34.0)
MCHC: 32.5 g/dL (ref 30.0–36.0)
MCV: 95.9 fL (ref 78.0–100.0)
Monocytes Absolute: 1.3 10*3/uL — ABNORMAL HIGH (ref 0.1–1.0)
Monocytes Relative: 27 % — ABNORMAL HIGH (ref 3–12)
NEUTROS ABS: 2.2 10*3/uL (ref 1.7–7.7)
NEUTROS PCT: 44 % (ref 43–77)
PLATELETS: 184 10*3/uL (ref 150–400)
RBC: 3.69 MIL/uL — ABNORMAL LOW (ref 4.22–5.81)
RDW: 16.9 % — AB (ref 11.5–15.5)
WBC: 5 10*3/uL (ref 4.0–10.5)

## 2013-02-05 LAB — URINALYSIS, ROUTINE W REFLEX MICROSCOPIC
Bilirubin Urine: NEGATIVE
Glucose, UA: NEGATIVE mg/dL
Hgb urine dipstick: NEGATIVE
Ketones, ur: NEGATIVE mg/dL
LEUKOCYTES UA: NEGATIVE
NITRITE: NEGATIVE
Protein, ur: NEGATIVE mg/dL
Specific Gravity, Urine: 1.005 (ref 1.005–1.030)
UROBILINOGEN UA: 0.2 mg/dL (ref 0.0–1.0)
pH: 5 (ref 5.0–8.0)

## 2013-02-05 LAB — COMPREHENSIVE METABOLIC PANEL
ALT: 17 U/L (ref 0–53)
AST: 20 U/L (ref 0–37)
Albumin: 3.8 g/dL (ref 3.5–5.2)
Alkaline Phosphatase: 90 U/L (ref 39–117)
BUN: 21 mg/dL (ref 6–23)
CO2: 25 meq/L (ref 19–32)
Calcium: 9.3 mg/dL (ref 8.4–10.5)
Chloride: 97 mEq/L (ref 96–112)
Creatinine, Ser: 1.6 mg/dL — ABNORMAL HIGH (ref 0.50–1.35)
GFR calc Af Amer: 44 mL/min — ABNORMAL LOW (ref >90)
GFR, EST NON AFRICAN AMERICAN: 38 mL/min — AB (ref >90)
Glucose, Bld: 104 mg/dL — ABNORMAL HIGH (ref 70–99)
POTASSIUM: 4.8 meq/L (ref 3.7–5.3)
SODIUM: 137 meq/L (ref 137–147)
Total Bilirubin: 1 mg/dL (ref 0.3–1.2)
Total Protein: 7.7 g/dL (ref 6.0–8.3)

## 2013-02-05 LAB — PROTIME-INR
INR: 1.47 (ref 0.00–1.49)
PROTHROMBIN TIME: 17.4 s — AB (ref 11.6–15.2)

## 2013-02-05 LAB — APTT: aPTT: 38 seconds — ABNORMAL HIGH (ref 24–37)

## 2013-02-05 NOTE — ED Provider Notes (Signed)
CSN: [id number]     Arrival date & time 02/05/13  1426 History   First MD Initiated Contact with Patient 02/05/13 1729     Chief Complaint  Patient presents with  . Weakness   (Consider location/radiation/quality/duration/timing/severity/associated sxs/prior Treatment) HPI Comments: Patient presents with a chief complaint of his balance feeling off with ambulation.  He states that when he woke up this morning at 6:30 AM this morning he felt that his balance was off with ambulation and that he was drifting to the right with ambulation.  This feeling has persisted throughout the day, but has improved somewhat.  He denies falling.  He denies dizziness or lightheadedness.  He denies headache, vision changes, weakness, numbness, tingling, facial droop, difficulty swallowing, or difficulty speaking.  He denies nausea, vomiting, cough, chest pain, SOB, fever, or chills.  He denies history of CVA or TIA.  No history of HTN, Hyperlipidemia, and DM.  He does have a history of Atrial Fibrillation s/p cardiac ablation.  He is currently on Coumadin 5 mg daily.  He reports that he was taken off of the Coumadin for a week because he had a Bunionectomy surgery done on 01/31/13.  He was restarted on the Coumdadin three days ago.  He also has a history of Complete Heart block and has a pacemaker in place.  Patient is a 80 y.o. male presenting with weakness. The history is provided by the patient.  Weakness    Past Medical History  Diagnosis Date  . Bursitis     OF THE RIGHT SHOULDER  . Tinnitus   . Easy bruising     SOME FROM HIS COUMADIN  . Permanent atrial fibrillation     s/p AV nodal ablation  . H/O: pneumonia     x3  . Depressed ejection fraction 10    EF previously 45-50%, s/p AV nodal ablation and biv pacemaker implant, previously enrolled in Block HF and was randomized to RV pacing only)  . Chronic anemia     RECIEVES ARANESP SHOTS IF HIS HEMOGLOBIN FALLS BELOW 11  . Blood transfusion     june 12  surgery  . GERD (gastroesophageal reflux disease)   . Chronic kidney disease     stones 85  . MDS (myelodysplastic syndrome), low grade 02/10/2011  . Iron overload, transfusional 02/10/2011  . Complete heart block 12/28/06    s/p AV nodal ablation and BIV pacemaker implant at [facility] by Dr Rosita Fire   Past Surgical History  Procedure Laterality Date  . Knee surgery      right Dec.2011,left June 2012  . Pacemaker insertion  12/28/06    Medtronic Insync BiV pacemaker implanted at [facility] by Dr Rosita Fire, enrolled in the Block HF study (now completed)  . Joint replacement    . Hernia repair    . Pilonidal cyst excision  70?  Marland Kitchen Kidney stone surgery    . Av nodal ablation  12/08    at [facility]   Family History  Problem Relation Age of Onset  . Cancer Mother    History  Substance Use Topics  . Smoking status: Former Research scientist (life sciences)  . Smokeless tobacco: Not on file  . Alcohol Use: No    Review of Systems  Neurological:       Ataxia  All other systems reviewed and are negative.    Allergies  Moxifloxacin hcl in nacl and Ramipril  Home Medications   Current Outpatient Rx  Name  Route  Sig  Dispense  Refill  . allopurinol (ZYLOPRIM) 100 MG tablet   Oral   Take 100 mg by mouth 3 (three) times daily.          . carvedilol (COREG) 6.25 MG tablet      TAKE ONE TABLET BY MOUTH TWICE DAILY   180 tablet   2   . famotidine (PEPCID) 20 MG tablet   Oral   Take 20 mg by mouth 2 (two) times daily.           . finasteride (PROSCAR) 5 MG tablet   Oral   Take 5 mg by mouth daily.           . furosemide (LASIX) 40 MG tablet      TAKE ONE TABLET BY MOUTH EVERY DAY   90 tablet   1   . Multiple Vitamin (MULTI-VITAMIN PO)   Oral   Take by mouth daily.           Marland Kitchen pyridoxine (B-6) 100 MG tablet   Oral   Take 100 mg by mouth 2 (two) times daily.           . tamsulosin (FLOMAX) 0.4 MG CAPS capsule   Oral   Take 0.4 mg by mouth daily.          Marland Kitchen warfarin (COUMADIN) 5 MG tablet   Oral   Take 5 mg by mouth daily.          BP 102/66  Pulse 82  Temp(Src) 98 F (36.7 C) (Oral)  Resp 19  SpO2 98% Physical Exam  Nursing note and vitals reviewed. Constitutional: He appears well-developed and well-nourished.  HENT:  Head: Normocephalic and atraumatic.  Mouth/Throat: Oropharynx is clear and moist.  Eyes: EOM are normal. Pupils are equal, round, and reactive to light.  Neck: Normal range of motion. Neck supple.  Cardiovascular: Normal rate, regular rhythm and normal heart sounds.   Pulmonary/Chest: Effort normal and breath sounds normal.  Abdominal: Soft. Bowel sounds are normal. He exhibits no distension and no mass. There is no tenderness. There is no rebound and no guarding.  Musculoskeletal: Normal range of motion.  Neurological: He is alert. He has normal strength. No cranial nerve deficit or sensory deficit. Coordination and gait normal.  Normal finger to nose testing Normal rapid alternating movements Patient reported ataxia with ambulation, but no obvious ataxia visualized.  Skin: Skin is warm and dry.  Psychiatric: He has a normal mood and affect.    ED Course  Procedures (including critical care time) Labs Review Labs Reviewed  CBC WITH DIFFERENTIAL - Abnormal; Notable for the following:    RBC 3.69 (*)    Hemoglobin 11.5 (*)    HCT 35.4 (*)    RDW 16.9 (*)    Monocytes Relative 27 (*)    Monocytes Absolute 1.3 (*)    All other components within normal limits  COMPREHENSIVE METABOLIC PANEL - Abnormal; Notable for the following:    Glucose, Bld 104 (*)    Creatinine, Ser 1.60 (*)    GFR calc non Af Amer 38 (*)    GFR calc Af Amer 44 (*)    All other components within normal limits  URINALYSIS, ROUTINE W REFLEX MICROSCOPIC   Imaging Review No results found.  EKG Interpretation    Date/Time:  Sunday February 05 2013 19:04:39 EST Ventricular Rate:  80 PR Interval:    QRS Duration: 179 QT  Interval:  490 QTC Calculation: 565 R Axis:   -117  Text Interpretation:  paced rhythm  Right bundle branch block Inferior infarct, old Abnormal lateral Q waves Anterior infarct, old Confirmed by BELFI  MD, MELANIE ([id number]) on 02/05/2013 7:08:08 PM           8:42 PM Discussed results of the CT scan with the patient.  Ambulated the patient once again.  He reports that his balance continues to feel slightly off, but is improved.  Discussed with Dr. Tamera Punt.  Will consult Neurology.  8:58 PM Discussed with Dr. Reeves Forth with Neurology.  He reports that he will consult on the patient and recommends admitting the patient to Hospitalist service.  Patient discussed with [facility] who has agreed to admit the patient. MDM  No diagnosis found. Patient presenting with a feeling of ataxia that began this morning.  Ataxia improved throughout the day.  Patient does have a history of Atrial Fibrillation and had been off of his Coumadin for one week due to a recent foot surgery and was restarted on it three days ago.  INR subtherapeutic today at 1.47.  Therefore, patient is at increased risk for TIA/CVA.  Labs unremarkable.  CT head negative.  Unable to perform MRI and MRA due to the fact that the patient has a pacemaker in place.  Neurology consulted and recommended admission and follow up CT head tomorrow.  Patient admitted to [facility] for further management.    Hyman Bible, PA-C 02/07/13 [phone]

## 2013-02-05 NOTE — ED Notes (Signed)
Neurology MD and Dr. Darnell Level at bedside.

## 2013-02-05 NOTE — H&P (Addendum)
PCP:  Thurman Coyer, MD  [facility] Mare Ferrari, Allred  Chief Complaint:   ataxia  HPI: [name] is a 80 y.o. male   has a past medical history of Bursitis; Tinnitus; Easy bruising; Permanent atrial fibrillation; H/O: pneumonia; Depressed ejection fraction (10); Chronic anemia; Blood transfusion; GERD (gastroesophageal reflux disease); Chronic kidney disease; MDS (myelodysplastic syndrome), low grade (02/10/2011); Iron overload, transfusional (02/10/2011); and Complete heart block (12/28/06).   Presented with  Patient woke up in the morning and felt like he was drifting to the right when he tried to walk. Denies any slurred speech no tingling no numbness. Patient drank some water and felt better but came to be evaluated. Denies being lightheaded. Denies vertigo. Denies chest pain, shortness of breath no headache. Of note he have had recently two surgeries on his foot (Bunion and bone spurs)  and had to hold his coumadin which he restarted only 3 days ago. Current INR 1.47 States he feels slightly woozy when he tries to stand up. Hospitalist called for admission. Neurology called to consult  Review of Systems:    Pertinent positives include: ataxia  Constitutional:  No weight loss, night sweats, Fevers, chills, fatigue, weight loss  HEENT:  No headaches, Difficulty swallowing,Tooth/dental problems,Sore throat,  No sneezing, itching, ear ache, nasal congestion, post nasal drip,  Cardio-vascular:  No chest pain, Orthopnea, PND, anasarca, dizziness, palpitations.no Bilateral lower extremity swelling  GI:  No heartburn, indigestion, abdominal pain, nausea, vomiting, diarrhea, change in bowel habits, loss of appetite, melena, blood in stool, hematemesis Resp:  no shortness of breath at rest. No dyspnea on exertion, No excess mucus, no productive cough, No non-productive cough, No coughing up of blood.No change in color of mucus.No wheezing. Skin:  no rash or lesions.  No jaundice GU:  no dysuria, change in color of urine, no urgency or frequency. No straining to urinate.  No flank pain.  Musculoskeletal:  No joint pain or no joint swelling. No decreased range of motion. No back pain.  Psych:  No change in mood or affect. No depression or anxiety. No memory loss.  Neuro: no localizing neurological complaints, no tingling, no weakness, no double vision,   no slurred speech, no confusion  Otherwise ROS are negative except for above, 10 systems were reviewed  Past Medical History: Past Medical History  Diagnosis Date  . Bursitis     OF THE RIGHT SHOULDER  . Tinnitus   . Easy bruising     SOME FROM HIS COUMADIN  . Permanent atrial fibrillation     s/p AV nodal ablation  . H/O: pneumonia     x3  . Depressed ejection fraction 10    EF previously 45-50%, s/p AV nodal ablation and biv pacemaker implant, previously enrolled in Block HF and was randomized to RV pacing only)  . Chronic anemia     RECIEVES ARANESP SHOTS IF HIS HEMOGLOBIN FALLS BELOW 11  . Blood transfusion     june 12 surgery  . GERD (gastroesophageal reflux disease)   . Chronic kidney disease     stones 87  . MDS (myelodysplastic syndrome), low grade 02/10/2011  . Iron overload, transfusional 02/10/2011  . Complete heart block 12/28/06    s/p AV nodal ablation and BIV pacemaker implant at [facility] by Dr Rosita Fire   Past Surgical History  Procedure Laterality Date  . Knee surgery      right Dec.2011,left June 2012  . Pacemaker insertion  12/28/06    Medtronic  Insync BiV pacemaker implanted at [facility] by Dr Rosita Fire, enrolled in the Block HF study (now completed)  . Joint replacement    . Hernia repair    . Pilonidal cyst excision  70?  Marland Kitchen Kidney stone surgery    . Av nodal ablation  12/08    at [facility]     Medications: Prior to Admission medications   Medication Sig Start Date End Date Taking? Authorizing Provider  allopurinol (ZYLOPRIM) 100 MG  tablet Take 100 mg by mouth 3 (three) times daily.  12/14/12  Yes Historical Provider, MD  carvedilol (COREG) 6.25 MG tablet TAKE ONE TABLET BY MOUTH TWICE DAILY 10/14/12  Yes Darlin Coco, MD  famotidine (PEPCID) 20 MG tablet Take 20 mg by mouth 2 (two) times daily.     Yes Historical Provider, MD  finasteride (PROSCAR) 5 MG tablet Take 5 mg by mouth daily.     Yes Historical Provider, MD  furosemide (LASIX) 40 MG tablet TAKE ONE TABLET BY MOUTH EVERY DAY 11/10/12  Yes Darlin Coco, MD  Multiple Vitamin (MULTI-VITAMIN PO) Take by mouth daily.     Yes Historical Provider, MD  pyridoxine (B-6) 100 MG tablet Take 100 mg by mouth 2 (two) times daily.     Yes Historical Provider, MD  tamsulosin (FLOMAX) 0.4 MG CAPS capsule Take 0.4 mg by mouth daily.   Yes Historical Provider, MD  warfarin (COUMADIN) 5 MG tablet Take 5 mg by mouth daily.   Yes [name], MD    Allergies:   Allergies  Allergen Reactions  . Moxifloxacin Hcl In Nacl Other (See Comments)    Pt c/o being "out of his head" & itching  . Ramipril     Renal function worsened on ACEi.    Social History:  Ambulatory   independently  Or with walker Lives at home with family   reports that he has quit smoking. He does not have any smokeless tobacco history on file. He reports that he does not drink alcohol or use illicit drugs.   Family History: family history includes COPD in his father; Cancer in his mother; Kidney disease in his brother; Melanoma in his mother.    Physical Exam: Patient Vitals for the past 24 hrs:  BP Temp Temp src Pulse Resp SpO2  02/05/13 2045 125/71 mmHg - - 81 19 98 %  02/05/13 2030 114/66 mmHg - - 80 18 99 %  02/05/13 1945 115/70 mmHg - - 80 18 99 %  02/05/13 1943 144/77 mmHg - - 80 20 98 %  02/05/13 1915 115/71 mmHg - - 80 19 97 %  02/05/13 1852 126/60 mmHg - - 85 18 98 %  02/05/13 1845 126/60 mmHg - - 81 17 98 %  02/05/13 1830 129/67 mmHg - - 80 18 97 %  02/05/13 1815 129/77 mmHg - -  80 17 98 %  02/05/13 1810 - 98 F (36.7 C) - - - -  02/05/13 1809 102/66 mmHg - - 80 18 97 %  02/05/13 1800 102/66 mmHg - - 82 19 98 %  02/05/13 1433 114/70 mmHg 98 F (36.7 C) Oral 80 20 98 %    1. General:  in No Acute distress 2. Psychological: Alert and Oriented 3. Head/ENT:   Moist  Mucous Membranes                          Head Non traumatic, neck supple  Normal  Dentition 4. SKIN: normal  Skin turgor,  Skin clean Dry and intact no rash 5. Heart: Regular rate and rhythm no Murmur, Rub or gallop 6. Lungs: Clear to auscultation bilaterally, no wheezes or crackles   7. Abdomen: Soft, non-tender, Non distended 8. Lower extremities: no clubbing, cyanosis, or edema 9. Neurologically strength 5/5 in all 4 ext. CN 2-12 intact. Finger to nose intact no pronator drift.  10. MSK: Normal range of motion  body mass index is unknown because there is no weight on file.   Labs on Admission:   Recent Labs  02/05/13 1445  NA 137  K 4.8  CL 97  CO2 25  GLUCOSE 104*  BUN 21  CREATININE 1.60*  CALCIUM 9.3    Recent Labs  02/05/13 1445  AST 20  ALT 17  ALKPHOS 90  BILITOT 1.0  PROT 7.7  ALBUMIN 3.8   No results found for this basename: LIPASE, AMYLASE,  in the last 72 hours  Recent Labs  02/05/13 1445  WBC 5.0  NEUTROABS 2.2  HGB 11.5*  HCT 35.4*  MCV 95.9  PLT 184   No results found for this basename: CKTOTAL, CKMB, CKMBINDEX, TROPONINI,  in the last 72 hours No results found for this basename: TSH, T4TOTAL, FREET3, T3FREE, THYROIDAB,  in the last 72 hours No results found for this basename: VITAMINB12, FOLATE, FERRITIN, TIBC, IRON, RETICCTPCT,  in the last 72 hours No results found for this basename: HGBA1C    The CrCl is unknown because both a height and weight (above a minimum accepted value) are required for this calculation. ABG No results found for this basename: phart, pco2, po2, hco3, tco2, acidbasedef, o2sat     Lab Results   Component Value Date   DDIMER  Value: 0.42        AT THE INHOUSE ESTABLISHED CUTOFF VALUE OF 0.48 ug/mL FEU, THIS ASSAY HAS BEEN DOCUMENTED IN THE LITERATURE TO HAVE 06/12/2007     Other results:  I have pearsonaly reviewed this: ECG REPORT  Rate: 80  Rhythm: paced   UA no infection   Cultures:    Component Value Date/Time   SDES SPUTUM 04/18/2008 1816   SDES SPUTUM 04/18/2008 1816   SPECREQUEST NONE 04/18/2008 1816   SPECREQUEST NONE 04/18/2008 1816   CULT NORMAL OROPHARYNGEAL FLORA 04/18/2008 1816   REPTSTATUS 04/18/2008 FINAL 04/18/2008 1816   REPTSTATUS 04/22/2008 FINAL 04/18/2008 1816       Radiological Exams on Admission: Ct Head Wo Contrast  02/05/2013   CLINICAL DATA:  Weakness.  EXAM: CT HEAD WITHOUT CONTRAST  TECHNIQUE: Contiguous axial images were obtained from the base of the skull through the vertex without intravenous contrast.  COMPARISON:  None.  FINDINGS: Sinuses/Soft tissues: Clear paranasal sinuses and mastoid air cells.  Intracranial: Mild low density in the periventricular white matter likely related to small vessel disease. No mass lesion, hemorrhage, hydrocephalus, acute infarct, intra-axial, or extra-axial fluid collection.  IMPRESSION: Normal head CT for age.   Electronically Signed   By: Abigail Miyamoto M.D.   On: 02/05/2013 20:31    Chart has been reviewed  Assessment/Plan  80 yo M with hx of a. Fib and complete heart block status post pacemaker here with  Ataxia worrisome for TIA Present on Admission:  . Atrial fibrillation with controlled ventricular response - continue coreg, will change to xarelto while admitted as per recommendation of neurology . Ataxia - PT/OT eval unable to do MRI will repeat CT in AM .  TIA (transient ischemic attack) -  - will admit based on TIA protocol, await results of repeat CT, Carotid Doppler and Echo, obtain cardiac enzymes,  ECG,  Lipid panel, TSH. Order PT/OT evaluation. Will make sure patient is on antiplatelet agent.   Neurology consult.   Continue coumadin  Hx of CHF, currently appears fluid down will hold lasix, follow Cr give gentle IVF.  CKD - at baseline Prophylaxis: coumadin   CODE STATUS: DNR/DNI as per patient  Other plan as per orders.  I have spent a total of 55 min on this admission  Sabra Sessler 02/05/2013, 9:59 PM

## 2013-02-05 NOTE — ED Notes (Signed)
Attempted report 

## 2013-02-05 NOTE — ED Notes (Signed)
Pt transported to CT ?

## 2013-02-05 NOTE — ED Notes (Signed)
Pt ST segment appears slightly elevated. EDPA made aware.

## 2013-02-05 NOTE — ED Notes (Signed)
Pt presents to department for evaluation of generalized weakness and difficulty walking. Pt states he woke up with gait disturbance. States when he walks he can't control his legs. Does not feel dizzy but states he feels very weak. Able to move all extremities. Pt is conscious alert and oriented x4.

## 2013-02-05 NOTE — ED Notes (Signed)
PA at bedside.

## 2013-02-05 NOTE — Consult Note (Addendum)
NEURO HOSPITALIST CONSULT NOTE    Reason for Consult: acute onset dysequilibrium.  HPI:                                                                                                                                          [name] is an 80 y.o. male, right handed, with a past medical history significant for atrial fibrillation s/p ablation, depressed ejection fraction, complete heart block s/p pacemaker placement, chronic kidney disease, myelodysplastic syndrome, GERD, comes in today due to sudden onset unsteadiness. He indicated that he woke up fine this morning, but when he tried to walk his balance was completely off and had a tendency to lean to the right all the time. He said that his legs were not weak or numb, and there wan not pain. Furthermore, he denied associated HA, vertigo, dizziness, difficulty swallowing, confusion, slurred speech, language or vision disturbances. He presented to the ED and said that he was still off balance around 3 pm today. Much improved but still " little bit off balance". CT brain showed no acute abnormality. Of note he have had recently two surgeries on his foot (Bunion and bone spurs) and had to hold his coumadin which he restarted only 3 days ago. INR today sub therapeutic 1.47      Past Medical History  Diagnosis Date  . Bursitis     OF THE RIGHT SHOULDER  . Tinnitus   . Easy bruising     SOME FROM HIS COUMADIN  . Permanent atrial fibrillation     s/p AV nodal ablation  . H/O: pneumonia     x3  . Depressed ejection fraction 10    EF previously 45-50%, s/p AV nodal ablation and biv pacemaker implant, previously enrolled in Block HF and was randomized to RV pacing only)  . Chronic anemia     RECIEVES ARANESP SHOTS IF HIS HEMOGLOBIN FALLS BELOW 11  . Blood transfusion     june 12 surgery  . GERD (gastroesophageal reflux disease)   . Chronic kidney disease     stones 87  . MDS (myelodysplastic syndrome), low grade  02/10/2011  . Iron overload, transfusional 02/10/2011  . Complete heart block 12/28/06    s/p AV nodal ablation and BIV pacemaker implant at [facility] by Dr Rosita Fire    Past Surgical History  Procedure Laterality Date  . Knee surgery      right Dec.2011,left June 2012  . Pacemaker insertion  12/28/06    Medtronic Insync BiV pacemaker implanted at [facility] by Dr Rosita Fire, enrolled in the Block HF study (now completed)  . Joint replacement    . Hernia repair    . Pilonidal cyst excision  70?  Marland Kitchen Kidney stone surgery    . Av  nodal ablation  12/08    at [facility]    Family History  Problem Relation Age of Onset  . Cancer Mother   . Melanoma Mother   . COPD Father   . Kidney disease Brother     Social History:  reports that he has quit smoking. He does not have any smokeless tobacco history on file. He reports that he does not drink alcohol or use illicit drugs.  Allergies  Allergen Reactions  . Moxifloxacin Hcl In Nacl Other (See Comments)    Pt c/o being "out of his head" & itching  . Ramipril     Renal function worsened on ACEi.    MEDICATIONS:                                                                                                                     I have reviewed the patient's current medications.   ROS:                                                                                                                                       History obtained from the patient  General ROS: negative for - chills, fatigue, fever, night sweats, weight gain or weight loss Psychological ROS: negative for - behavioral disorder, hallucinations, memory difficulties, mood swings or suicidal ideation Ophthalmic ROS: negative for - blurry vision, double vision, eye pain or loss of vision ENT ROS: negative for - epistaxis, nasal discharge, oral lesions, sore throat, tinnitus or vertigo Allergy and Immunology ROS: negative for - hives or itchy/watery  eyes Hematological and Lymphatic ROS: negative for - bleeding problems, bruising or swollen lymph nodes Endocrine ROS: negative for - galactorrhea, hair pattern changes, polydipsia/polyuria or temperature intolerance Respiratory ROS: negative for - cough, hemoptysis, shortness of breath or wheezing Cardiovascular ROS: negative for - chest pain, dyspnea on exertion, edema or irregular heartbeat Gastrointestinal ROS: negative for - abdominal pain, diarrhea, hematemesis, nausea/vomiting or stool incontinence Genito-Urinary ROS: negative for - dysuria, hematuria, incontinence or urinary frequency/urgency Musculoskeletal ROS: negative for - joint swelling or muscular weakness Neurological ROS: as noted in HPI Dermatological ROS: negative for rash and skin lesion changes   Physical exam: pleasant male in no apparent distress. Blood pressure 111/70, pulse 80, temperature 98 F (36.7 C), temperature source Oral, resp. rate 17, SpO2 96.00%. Head: normocephalic. Neck: supple, no bruits, no JVD. Cardiac: no murmurs. Lungs: clear.  Abdomen: soft, no tender, no mass. Extremities: no edema. Cast right foot s/p bunion surgery and bone spurs surgery   Neurologic Examination:                                                                                                      Mental Status: Alert, oriented, thought content appropriate.  Speech fluent without evidence of aphasia.  Able to follow 3 step commands without difficulty. Cranial Nerves: II: Discs flat bilaterally; Visual fields grossly normal, pupils equal, round, reactive to light and accommodation III,IV, VI: ptosis not present, extra-ocular motions intact bilaterally V,VII: smile symmetric, facial light touch sensation normal bilaterally VIII: hearing normal bilaterally IX,X: gag reflex present XI: bilateral shoulder shrug XII: midline tongue extension without atrophy or fasciculations  Motor: Right : Upper extremity   5/5    Left:      Upper extremity   5/5  Lower extremity   5/5     Lower extremity   5/5 Tone and bulk:normal tone throughout; no atrophy noted Sensory: Pinprick and light touch intact throughout, bilaterally Deep Tendon Reflexes:  Right: Upper Extremity   Left: Upper extremity   biceps (C-5 to C-6) 2/4   biceps (C-5 to C-6) 2/4 tricep (C7) 2/4    triceps (C7) 2/4 Brachioradialis (C6) 2/4  Brachioradialis (C6) 2/4  Lower Extremity Lower Extremity  quadriceps (L-2 to L-4) 2/4   quadriceps (L-2 to L-4) 2/4 Achilles (S1) 2/4   Achilles (S1) 2/4  Plantars: Right: downgoing   Left: downgoing Cerebellar: normal finger-to-nose,  normal heel-to-shin test Gait:  No frank ataxia. CV: pulses palpable throughout    Lab Results  Component Value Date/Time   CHOL  Value: 115        ATP III CLASSIFICATION:  <200     mg/dL   Desirable  200-239  mg/dL   Borderline High  >=240    mg/dL   High        04/17/2008  5:06 AM    Results for orders placed during the hospital encounter of 02/05/13 (from the past 48 hour(s))  CBC WITH DIFFERENTIAL     Status: Abnormal   Collection Time    02/05/13  2:45 PM      Result Value Range   WBC 5.0  4.0 - 10.5 K/uL   RBC 3.69 (*) 4.22 - 5.81 MIL/uL   Hemoglobin 11.5 (*) 13.0 - 17.0 g/dL   HCT 35.4 (*) 39.0 - 52.0 %   MCV 95.9  78.0 - 100.0 fL   MCH 31.2  26.0 - 34.0 pg   MCHC 32.5  30.0 - 36.0 g/dL   RDW 16.9 (*) 11.5 - 15.5 %   Platelets 184  150 - 400 K/uL   Neutrophils Relative % 44  43 - 77 %   Neutro Abs 2.2  1.7 - 7.7 K/uL   Lymphocytes Relative 28  12 - 46 %   Lymphs Abs 1.4  0.7 - 4.0 K/uL   Monocytes Relative 27 (*) 3 - 12 %   Monocytes Absolute 1.3 (*) 0.1 - 1.0  K/uL   Eosinophils Relative 1  0 - 5 %   Eosinophils Absolute 0.1  0.0 - 0.7 K/uL   Basophils Relative 0  0 - 1 %   Basophils Absolute 0.0  0.0 - 0.1 K/uL  COMPREHENSIVE METABOLIC PANEL     Status: Abnormal   Collection Time    02/05/13  2:45 PM      Result Value Range   Sodium 137  137 - 147 mEq/L    Potassium 4.8  3.7 - 5.3 mEq/L   Chloride 97  96 - 112 mEq/L   CO2 25  19 - 32 mEq/L   Glucose, Bld 104 (*) 70 - 99 mg/dL   BUN 21  6 - 23 mg/dL   Creatinine, Ser 1.60 (*) 0.50 - 1.35 mg/dL   Calcium 9.3  8.4 - 10.5 mg/dL   Total Protein 7.7  6.0 - 8.3 g/dL   Albumin 3.8  3.5 - 5.2 g/dL   AST 20  0 - 37 U/L   ALT 17  0 - 53 U/L   Alkaline Phosphatase 90  39 - 117 U/L   Total Bilirubin 1.0  0.3 - 1.2 mg/dL   GFR calc non Af Amer 38 (*) >90 mL/min   GFR calc Af Amer 44 (*) >90 mL/min   Comment: (NOTE)     The eGFR has been calculated using the CKD EPI equation.     This calculation has not been validated in all clinical situations.     eGFR's persistently <90 mL/min signify possible Chronic Kidney     Disease.  URINALYSIS, ROUTINE W REFLEX MICROSCOPIC     Status: None   Collection Time    02/05/13  6:09 PM      Result Value Range   Color, Urine YELLOW  YELLOW   APPearance CLEAR  CLEAR   Specific Gravity, Urine 1.005  1.005 - 1.030   pH 5.0  5.0 - 8.0   Glucose, UA NEGATIVE  NEGATIVE mg/dL   Hgb urine dipstick NEGATIVE  NEGATIVE   Bilirubin Urine NEGATIVE  NEGATIVE   Ketones, ur NEGATIVE  NEGATIVE mg/dL   Protein, ur NEGATIVE  NEGATIVE mg/dL   Urobilinogen, UA 0.2  0.0 - 1.0 mg/dL   Nitrite NEGATIVE  NEGATIVE   Leukocytes, UA NEGATIVE  NEGATIVE   Comment: MICROSCOPIC NOT DONE ON URINES WITH NEGATIVE PROTEIN, BLOOD, LEUKOCYTES, NITRITE, OR GLUCOSE <1000 mg/dL.  PROTIME-INR     Status: Abnormal   Collection Time    02/05/13  6:56 PM      Result Value Range   Prothrombin Time 17.4 (*) 11.6 - 15.2 seconds   INR 1.47  0.00 - 1.49  APTT     Status: Abnormal   Collection Time    02/05/13  6:56 PM      Result Value Range   aPTT 38 (*) 24 - 37 seconds   Comment:            IF BASELINE aPTT IS ELEVATED,     SUGGEST PATIENT RISK ASSESSMENT     BE USED TO DETERMINE APPROPRIATE     ANTICOAGULANT THERAPY.    Ct Head Wo Contrast  02/05/2013   CLINICAL DATA:  Weakness.  EXAM:  CT HEAD WITHOUT CONTRAST  TECHNIQUE: Contiguous axial images were obtained from the base of the skull through the vertex without intravenous contrast.  COMPARISON:  None.  FINDINGS: Sinuses/Soft tissues: Clear paranasal sinuses and mastoid air cells.  Intracranial: Mild low density in the  periventricular white matter likely related to small vessel disease. No mass lesion, hemorrhage, hydrocephalus, acute infarct, intra-axial, or extra-axial fluid collection.  IMPRESSION: Normal head CT for age.   Electronically Signed   By: Abigail Miyamoto M.D.   On: 02/05/2013 20:31   Assessment/Plan: 80 y/o with atrial fibrillation presents with acute isolated ataxia (virtually resolved, no ataxia noted on my exam). Restarted coumadin 3 days ago and INR is sub therapeutic 1.7 today. Probable cerebrovascular event involving posterior circulation. Agree with completing TIA/stroke work up. Can not have MRI due to pacemaker thus will get follow up CT in 24 hours. Will suggest switching to xarelto which will make therapeutic in few hours. Will follow up  Dorian Pod, MD 02/05/2013, 11:18 PM

## 2013-02-05 NOTE — ED Notes (Signed)
PT able to ambulate independently in room. Pt reports he still has a feeling of being unbalanced but it is not nearly as bad as PTA. Pt ambulates in a straight line.

## 2013-02-06 ENCOUNTER — Encounter (HOSPITAL_COMMUNITY): Payer: Self-pay | Admitting: General Practice

## 2013-02-06 ENCOUNTER — Observation Stay (HOSPITAL_COMMUNITY): Payer: Medicare Other

## 2013-02-06 DIAGNOSIS — D649 Anemia, unspecified: Secondary | ICD-10-CM | POA: Diagnosis present

## 2013-02-06 DIAGNOSIS — N183 Chronic kidney disease, stage 3 unspecified: Secondary | ICD-10-CM

## 2013-02-06 DIAGNOSIS — I379 Nonrheumatic pulmonary valve disorder, unspecified: Secondary | ICD-10-CM

## 2013-02-06 DIAGNOSIS — G459 Transient cerebral ischemic attack, unspecified: Secondary | ICD-10-CM

## 2013-02-06 LAB — HEMOGLOBIN A1C
HEMOGLOBIN A1C: 5 % (ref <5.7)
MEAN PLASMA GLUCOSE: 97 mg/dL (ref <117)

## 2013-02-06 LAB — LIPID PANEL
Cholesterol: 117 mg/dL (ref 0–200)
HDL: 42 mg/dL (ref >39)
LDL Cholesterol: 53 mg/dL (ref 0–99)
Total CHOL/HDL Ratio: 2.8 RATIO
Triglycerides: 110 mg/dL (ref <150)
VLDL: 22 mg/dL (ref 0–40)

## 2013-02-06 LAB — GLUCOSE, CAPILLARY
GLUCOSE-CAPILLARY: 96 mg/dL (ref 70–99)
Glucose-Capillary: 99 mg/dL (ref 70–99)

## 2013-02-06 LAB — TROPONIN I: Troponin I: <0.3 ng/mL (ref <0.30)

## 2013-02-06 MED ORDER — ALLOPURINOL 100 MG PO TABS
100.0000 mg | ORAL_TABLET | Freq: Three times a day (TID) | ORAL | Status: DC
  Administered 2013-02-06 – 2013-02-07 (×5): 100 mg via ORAL
  Filled 2013-02-06 (×6): qty 1

## 2013-02-06 MED ORDER — SODIUM CHLORIDE 0.9 % IV SOLN
INTRAVENOUS | Status: DC
  Administered 2013-02-06: 02:00:00 via INTRAVENOUS

## 2013-02-06 MED ORDER — FINASTERIDE 5 MG PO TABS
5.0000 mg | ORAL_TABLET | Freq: Every day | ORAL | Status: DC
  Administered 2013-02-06 – 2013-02-07 (×2): 5 mg via ORAL
  Filled 2013-02-06 (×2): qty 1

## 2013-02-06 MED ORDER — FAMOTIDINE 20 MG PO TABS
20.0000 mg | ORAL_TABLET | Freq: Two times a day (BID) | ORAL | Status: DC
  Administered 2013-02-06 – 2013-02-07 (×4): 20 mg via ORAL
  Filled 2013-02-06 (×5): qty 1

## 2013-02-06 MED ORDER — ASPIRIN 325 MG PO TABS
325.0000 mg | ORAL_TABLET | Freq: Every day | ORAL | Status: DC
  Administered 2013-02-06 – 2013-02-07 (×2): 325 mg via ORAL
  Filled 2013-02-06 (×2): qty 1

## 2013-02-06 MED ORDER — CARVEDILOL 6.25 MG PO TABS
6.2500 mg | ORAL_TABLET | Freq: Two times a day (BID) | ORAL | Status: DC
  Administered 2013-02-06 – 2013-02-07 (×3): 6.25 mg via ORAL
  Filled 2013-02-06 (×6): qty 1

## 2013-02-06 MED ORDER — TAMSULOSIN HCL 0.4 MG PO CAPS
0.4000 mg | ORAL_CAPSULE | Freq: Every day | ORAL | Status: DC
  Administered 2013-02-06 – 2013-02-07 (×2): 0.4 mg via ORAL
  Filled 2013-02-06 (×2): qty 1

## 2013-02-06 MED ORDER — ACETAMINOPHEN 325 MG PO TABS: 650.0000 mg | ORAL_TABLET | ORAL | Status: DC | PRN

## 2013-02-06 MED ORDER — RIVAROXABAN 15 MG PO TABS
15.0000 mg | ORAL_TABLET | Freq: Every day | ORAL | Status: DC
  Administered 2013-02-06 – 2013-02-07 (×3): 15 mg via ORAL
  Filled 2013-02-06 (×3): qty 1

## 2013-02-06 NOTE — Progress Notes (Signed)
  Echocardiogram 2D Echocardiogram has been performed.  [name] 02/06/2013, 11:23 AM

## 2013-02-06 NOTE — Progress Notes (Signed)
[facility] PROGRESS NOTE    [name] IOX:[id number] DOB: [birth year] DOA: 02/05/2013 PCP: Thurman Coyer, MD  HPI/Brief narrative 80 year old male with history of permanent A. fib on Coumadin anticoagulation, complete heart block status post PPM, cardiomyopathy with EF 25% (03/23/12), MDS, chronic anemia, GERD, chronic kidney disease, recent right foot surgery (Coumadin was held for 4-5 days and resumed post op approximately 3 days ago) was admitted with complaints of unsteady gait with tendency to lean to the right. He denied any extremity weakness, tingling, numbness, slurred speech or facial twisting. He was admitted for possible TIA/CVA.  Assessment/Plan:  TIA/gait ataxia - Ataxia had resolved by the time patient arrived in ED. Initial CT head negative for acute findings. Cannot do MRI due to pacemaker. - Evaluated by neurology who recommended completing TIA/stroke workup including repeat CT head after 24 hours.  - Neurology also recommended switching anticoagulation to Xarelto. Admitting INR was subtherapeutic.  Permanent atrial fibrillation/CHB s/p PPM/cardiomyopathy with EF 25% - Currently in V paced rhythm. Anticoagulated on Xarelto.   Stage III chronic kidney disease  - Creatinine baseline.   Chronic anemia/MDS - Anemia stable  Recent right foot surgery - Continue daily dressing. Outpatient followup with surgery on Friday.    Code Status: DNR Family Communication: None at bedside Disposition Plan: Home when medically stable.   Consultants:  Neurology  Procedures:  None  Antibiotics:  None   Subjective: Denies complaints. Cannot tell about on unsteadiness because has not ambulated today.   Objective: Filed Vitals:   02/06/13 0600 02/06/13 0601 02/06/13 0603 02/06/13 0606  BP: 107/62 103/62 88/55 101/59  Pulse:      Temp: 98.4 F (36.9 C)     TempSrc:      Resp:      Height:      Weight:      SpO2: 97%       Intake/Output Summary  (Last 24 hours) at 02/06/13 0855 Last data filed at 02/06/13 0654  Gross per 24 hour  Intake    522 ml  Output    850 ml  Net   -328 ml   Filed Weights   02/06/13 0002  Weight: 89.3 kg (196 lb 13.9 oz)     Exam:  General exam: Elderly male lying comfortably in bed.  Respiratory system: Clear. No increased work of breathing. Cardiovascular system: S1 & S2 heard, RRR. No JVD, murmurs, gallops, clicks or pedal edema. telemetry: V. paced rhythm in the 80s.  Gastrointestinal system: Abdomen is nondistended, soft and nontender. Normal bowel sounds heard. Central nervous system: Alert and oriented. No focal neurological deficits. Extremities: Symmetric 5 x 5 power.Dressing over right foot, clean, dry and intact    Data Reviewed: Basic Metabolic Panel:  Recent Labs Lab 02/05/13 1445  NA 137  K 4.8  CL 97  CO2 25  GLUCOSE 104*  BUN 21  CREATININE 1.60*  CALCIUM 9.3   Liver Function Tests:  Recent Labs Lab 02/05/13 1445  AST 20  ALT 17  ALKPHOS 90  BILITOT 1.0  PROT 7.7  ALBUMIN 3.8   No results found for this basename: LIPASE, AMYLASE,  in the last 168 hours No results found for this basename: AMMONIA,  in the last 168 hours CBC:  Recent Labs Lab 02/05/13 1445  WBC 5.0  NEUTROABS 2.2  HGB 11.5*  HCT 35.4*  MCV 95.9  PLT 184   Cardiac Enzymes:  Recent Labs Lab 02/06/13 0420  TROPONINI <0.30   BNP (last 3  results) No results found for this basename: PROBNP,  in the last 8760 hours CBG:  Recent Labs Lab 02/06/13 0112  GLUCAP 96    No results found for this or any previous visit (from the past 240 hour(s)).    Additional labs: 1. Fasting lipids: Cholesterol 117, triglycerides 110, HDL 42, LDL 53 and VLDL 22 2. INR on admission: 1.47     Studies: Ct Head Wo Contrast  02/05/2013   CLINICAL DATA:  Weakness.  EXAM: CT HEAD WITHOUT CONTRAST  TECHNIQUE: Contiguous axial images were obtained from the base of the skull through the vertex without  intravenous contrast.  COMPARISON:  None.  FINDINGS: Sinuses/Soft tissues: Clear paranasal sinuses and mastoid air cells.  Intracranial: Mild low density in the periventricular white matter likely related to small vessel disease. No mass lesion, hemorrhage, hydrocephalus, acute infarct, intra-axial, or extra-axial fluid collection.  IMPRESSION: Normal head CT for age.   Electronically Signed   By: Abigail Miyamoto M.D.   On: 02/05/2013 20:31        Scheduled Meds: . allopurinol  100 mg Oral TID  . aspirin  325 mg Oral Daily  . carvedilol  6.25 mg Oral BID WC  . famotidine  20 mg Oral BID  . finasteride  5 mg Oral Daily  . rivaroxaban  15 mg Oral Q supper  . tamsulosin  0.4 mg Oral Daily   Continuous Infusions: . sodium chloride 50 mL/hr at 02/06/13 0139    Active Problems:   Atrial fibrillation with controlled ventricular response   Chronic systolic congestive heart failure   Ataxia   TIA (transient ischemic attack)    Time spent: 31 minutes    Shakir Petrosino, MD, FACP, FHM. [facility] Pager [phone]  If 7PM-7AM, please contact night-coverage [email or web address] Password [email or web address] 02/06/2013, 8:55 AM    LOS: 1 day

## 2013-02-06 NOTE — Evaluation (Signed)
Physical Therapy Evaluation Patient Details Name: MACE WEINBERG MRN: [id number] DOB: 06-10-1927 Today's Date: 02/06/2013 Time: 3875-6433 PT Time Calculation (min): 21 min  PT Assessment / Plan / Recommendation History of Present Illness  80 y.o. male admitted to [facility] on 02/05/13 with history of permanent A. fib on Coumadin anticoagulation, complete heart block status post PPM, cardiomyopathy with EF 25% (03/23/12), MDS, chronic anemia, GERD, chronic kidney disease, recent right foot surgery (Coumadin was held for 4-5 days and resumed post op approximately 3 days ago) was admitted with complaints of unsteady gait with tendency to lean to the right. He denied any extremity weakness, tingling, numbness, slurred speech or facial twisting. He was admitted for possible TIA/CVA.  Clinical Impression  Pt is mobilizing well with RW and post-op shoe.  No signs of leaning or listing to the right like reported on admission.  PT to follow acutely to progress gait, practice stairs and progress exercises, but he will likely not need any PT f/u at home.   PT to follow acutely for deficits listed below.       PT Assessment  Patient needs continued PT services    Follow Up Recommendations  No PT follow up;Supervision for mobility/OOB    Does the patient have the potential to tolerate intense rehabilitation     NA  Barriers to Discharge   None      Equipment Recommendations  None recommended by PT    Recommendations for Other Services   None  Frequency Min 3X/week    Precautions / Restrictions Precautions Precautions: Fall Restrictions Weight Bearing Restrictions: Yes RLE Weight Bearing: Weight bearing as tolerated Other Position/Activity Restrictions: per pt he is supposed to WB through right heel wearing post-op shoe.     Pertinent Vitals/Pain See vitals flow sheet. (mildly positive orthostatic hypotension- pt asymptomatic).         Mobility  Bed Mobility Overal bed mobility: Modified  Independent General bed mobility comments: used bed rails Transfers Overall transfer level: Needs assistance Transfers: Sit to/from Stand Sit to Stand: Supervision General transfer comment: supervision for safety, cues for safe hand placement.  Ambulation/Gait Ambulation/Gait assistance: Supervision Ambulation Distance (Feet): 15 Feet Assistive device: Rolling walker (2 wheeled) Gait Pattern/deviations: Step-through pattern;Antalgic;Trunk flexed Gait velocity: decreased General Gait Details: supervision for safety due to abnormal gait pattern, cues for proximity to RW and upright posture.   Modified Rankin (Stroke Patients Only) Pre-Morbid Rankin Score: Moderate disability Modified Rankin: Moderate disability        PT Diagnosis: Difficulty walking;Abnormality of gait  PT Problem List: Decreased strength;Decreased activity tolerance;Decreased balance;Decreased mobility;Decreased knowledge of use of DME PT Treatment Interventions: DME instruction;Gait training;Stair training;Functional mobility training;Therapeutic activities;Therapeutic exercise;Balance training;Neuromuscular re-education;Patient/family education     PT Goals(Current goals can be found in the care plan section) Acute Rehab PT Goals Patient Stated Goal: to go home PT Goal Formulation: With patient/family Time For Goal Achievement: 02/20/13 Potential to Achieve Goals: Good  Visit Information  Last PT Received On: 02/06/13 Assistance Needed: +1 History of Present Illness: 80 y.o. male admitted to [facility] on 02/05/13 with history of permanent A. fib on Coumadin anticoagulation, complete heart block status post PPM, cardiomyopathy with EF 25% (03/23/12), MDS, chronic anemia, GERD, chronic kidney disease, recent right foot surgery (Coumadin was held for 4-5 days and resumed post op approximately 3 days ago) was admitted with complaints of unsteady gait with tendency to lean to the right. He denied any extremity weakness,  tingling, numbness, slurred speech or facial twisting.  He was admitted for possible TIA/CVA.       Prior [place] expects to be discharged to:: Private residence Living Arrangements: Spouse/significant other Available Help at Discharge: Family;Available 24 hours/day Type of Home: House Home Access: Stairs to enter [employer] of Steps: 2 Entrance Stairs-Rails: None (pt goes in backwards with RW) Home Layout: One level Home Equipment: Walker - 2 wheels;Bedside commode;Grab bars - tub/shower (doesn't use the [facility]) Prior Function Level of Independence: Independent with assistive device(s) Comments: used RW since foot surgery on 01/31/13 Communication Communication: HOH (L side hearing aid) Dominant Hand: Right    Cognition  Cognition Arousal/Alertness: Awake/alert Behavior During Therapy: WFL for tasks assessed/performed Overall Cognitive Status: Within Functional Limits for tasks assessed    Extremity/Trunk Assessment Upper Extremity Assessment Upper Extremity Assessment: Defer to OT evaluation Lower Extremity Assessment Lower Extremity Assessment: RLE deficits/detail RLE Deficits / Details: generally limited due to pain in right foot from surgery Cervical / Trunk Assessment Cervical / Trunk Assessment: Normal   Balance Balance Overall balance assessment: Needs assistance Sitting-balance support: No upper extremity supported;Feet supported Sitting balance-Leahy Scale: Normal Standing balance support: Bilateral upper extremity supported Standing balance-Leahy Scale: Fair  End of Session PT - End of Session Equipment Utilized During Treatment: Gait belt;Other (comment) (post-op shoe right foot.) Activity Tolerance: Patient tolerated treatment well Patient left: in bed;with call bell/phone within reach;Other (comment) (with transporter to take him to test) Nurse Communication: Mobility status  GP Functional Assessment Tool Used: assist  level Functional Limitation: Mobility: Walking and moving around Mobility: Walking and Moving Around Current Status ([id number]): At least 1 percent but less than 20 percent impaired, limited or restricted Mobility: Walking and Moving Around Goal Status [id number]): 0 percent impaired, limited or restricted   Jamesetta Greenhalgh B. [place], [place], DPT [phone]   02/06/2013, 12:24 PM

## 2013-02-06 NOTE — Progress Notes (Signed)
The Chaplain offered emotional and spiritual supportto the patient today. The patient was informing the Chaplain that God has been good to him and his life and the Chaplain prayed with the patient at the end of their visit together.  Chaplain Clista Bernhardt Rogerio Boutelle

## 2013-02-06 NOTE — Progress Notes (Signed)
ANTICOAGULATION CONSULT NOTE - Initial Consult  Pharmacy Consult for Xarelto Indication: atrial fibrillation  Allergies  Allergen Reactions  . Moxifloxacin Hcl In Nacl Other (See Comments)    Pt c/o being "out of his head" & itching  . Ramipril     Renal function worsened on ACEi.    Patient Measurements: Height: 6\' 3"  (190.5 cm) Weight: 196 lb 13.9 oz (89.3 kg) IBW/kg (Calculated) : 84.5  Vital Signs: Temp: 98.7 F (37.1 C) (01/19 0002) Temp src: Oral (01/19 0002) BP: 122/66 mmHg (01/19 0002) Pulse Rate: 80 (01/18 2245)  Labs:  Recent Labs  02/05/13 1445 02/05/13 1856  HGB 11.5*  --   HCT 35.4*  --   PLT 184  --   APTT  --  38*  LABPROT  --  17.4*  INR  --  1.47  CREATININE 1.60*  --     Estimated Creatinine Clearance: 41.8 ml/min (by C-G formula based on Cr of 1.6).   Medical History: Past Medical History  Diagnosis Date  . Bursitis     OF THE RIGHT SHOULDER  . Tinnitus   . Easy bruising     SOME FROM HIS COUMADIN  . Permanent atrial fibrillation     s/p AV nodal ablation  . H/O: pneumonia     x3  . Depressed ejection fraction 10    EF previously 45-50%, s/p AV nodal ablation and biv pacemaker implant, previously enrolled in Block HF and was randomized to RV pacing only)  . Chronic anemia     RECIEVES ARANESP SHOTS IF HIS HEMOGLOBIN FALLS BELOW 11  . Blood transfusion     june 12 surgery  . GERD (gastroesophageal reflux disease)   . Chronic kidney disease     stones 85  . MDS (myelodysplastic syndrome), low grade 02/10/2011  . Iron overload, transfusional 02/10/2011  . Complete heart block 12/28/06    s/p AV nodal ablation and BIV pacemaker implant at [facility] by Dr Rosita Fire    Medications:  Prescriptions prior to admission  Medication Sig Dispense Refill  . allopurinol (ZYLOPRIM) 100 MG tablet Take 100 mg by mouth 3 (three) times daily.       . carvedilol (COREG) 6.25 MG tablet TAKE ONE TABLET BY MOUTH TWICE DAILY  180 tablet  2   . famotidine (PEPCID) 20 MG tablet Take 20 mg by mouth 2 (two) times daily.        . finasteride (PROSCAR) 5 MG tablet Take 5 mg by mouth daily.        . furosemide (LASIX) 40 MG tablet TAKE ONE TABLET BY MOUTH EVERY DAY  90 tablet  1  . Multiple Vitamin (MULTI-VITAMIN PO) Take by mouth daily.        Marland Kitchen pyridoxine (B-6) 100 MG tablet Take 100 mg by mouth 2 (two) times daily.        . tamsulosin (FLOMAX) 0.4 MG CAPS capsule Take 0.4 mg by mouth daily.      Marland Kitchen warfarin (COUMADIN) 5 MG tablet Take 5 mg by mouth daily.        Assessment: 80yo male presents w/ generalized weakness and difficulty walking, INR is subtherapeutic w/ concern for new TIA, to convert to Xarelto for Afib.    Plan:  Will begin Xarelto 15mg  po daily with evening meal and begin Xarelto education.  Wynona Neat, PharmD, BCPS  02/06/2013,12:26 AM

## 2013-02-06 NOTE — Progress Notes (Signed)
*  PRELIMINARY RESULTS* Vascular Ultrasound Carotid Duplex (Doppler) has been completed.   Findings suggest 1-39% internal carotid artery stenosis bilaterally. The right vertebral artery demonstrates antegrade flow with atypical waveforms. The right subclavian artery is patent with antegrade flow. The left vertebral artery is patent with antegrade flow.  02/06/2013 11:22 AM Maudry Mayhew, RVT, RDCS, RDMS

## 2013-02-06 NOTE — Progress Notes (Signed)
Subjective: Patient at baseline.  Has had no further events.  Has been changed to Xarelto but wants to continue on Coumadin unless his cardiologist tells him it is OK.  Tolerating Xarelto with no incident.      Objective: Current vital signs: BP 103/66  Pulse 83  Temp(Src) 98.4 F (36.9 C) (Oral)  Resp 17  Ht 6\' 3"  (1.905 m)  Wt 89.3 kg (196 lb 13.9 oz)  BMI 24.61 kg/m2  SpO2 97% Vital signs in last 24 hours: Temp:  [98 F (36.7 C)-98.7 F (37.1 C)] 98.4 F (36.9 C) (01/19 0600) Pulse Rate:  [80-85] 83 (01/19 1033) Resp:  [12-25] 17 (01/18 2245) BP: (88-144)/(55-77) 103/66 mmHg (01/19 1033) SpO2:  [95 %-99 %] 97 % (01/19 0600) Weight:  [89.3 kg (196 lb 13.9 oz)] 89.3 kg (196 lb 13.9 oz) (01/19 0002)  Intake/Output from previous day: 01/18 0701 - 01/19 0700 In: 522 [P.O.:522] Out: 850 [Urine:850] Intake/Output this shift:   Nutritional status: Carb Control  Neurologic Exam: Mental Status:  Alert, oriented, thought content appropriate. Speech fluent without evidence of aphasia. Able to follow 3 step commands without difficulty.  Cranial Nerves:  II: Discs flat bilaterally; Visual fields grossly normal, pupils equal, round, reactive to light and accommodation  III,IV, VI: ptosis not present, extra-ocular motions intact bilaterally  V,VII: smile symmetric, facial light touch sensation normal bilaterally  VIII: hearing normal bilaterally  IX,X: gag reflex present  XI: bilateral shoulder shrug  XII: midline tongue extension without atrophy or fasciculations  Motor:  5/5 throughout  Sensory: Pinprick and light touch intact throughout, bilaterally  Deep Tendon Reflexes:  2+ throughout Plantars:  Right: downgoing   Left: downgoing  Cerebellar:  normal finger-to-nose, normal heel-to-shin test   Lab Results: Basic Metabolic Panel:  Recent Labs Lab 02/05/13 1445  NA 137  K 4.8  CL 97  CO2 25  GLUCOSE 104*  BUN 21  CREATININE 1.60*  CALCIUM 9.3    Liver  Function Tests:  Recent Labs Lab 02/05/13 1445  AST 20  ALT 17  ALKPHOS 90  BILITOT 1.0  PROT 7.7  ALBUMIN 3.8   No results found for this basename: LIPASE, AMYLASE,  in the last 168 hours No results found for this basename: AMMONIA,  in the last 168 hours  CBC:  Recent Labs Lab 02/05/13 1445  WBC 5.0  NEUTROABS 2.2  HGB 11.5*  HCT 35.4*  MCV 95.9  PLT 184    Cardiac Enzymes:  Recent Labs Lab 02/06/13 0420  TROPONINI <0.30    Lipid Panel:  Recent Labs Lab 02/06/13 0420  CHOL 117  TRIG 110  HDL 42  CHOLHDL 2.8  VLDL 22  LDLCALC 53    CBG:  Recent Labs Lab 02/06/13 0112  GLUCAP 96    Microbiology: Results for orders placed during the hospital encounter of 12/19/10  SURGICAL PCR SCREEN     Status: None   Collection Time    12/19/10  9:33 AM      Result Value Range Status   MRSA, PCR NEGATIVE  NEGATIVE Final   Staphylococcus aureus NEGATIVE  NEGATIVE Final   Comment:            The Xpert SA Assay (FDA     approved for NASAL specimens     only), is one component of     a comprehensive surveillance     program.  It is not intended     to diagnose infection nor to  guide or monitor treatment.    Coagulation Studies:  Recent Labs  02/05/13 1856  LABPROT 17.4*  INR 1.47    Imaging: Ct Head Wo Contrast  02/05/2013   CLINICAL DATA:  Weakness.  EXAM: CT HEAD WITHOUT CONTRAST  TECHNIQUE: Contiguous axial images were obtained from the base of the skull through the vertex without intravenous contrast.  COMPARISON:  None.  FINDINGS: Sinuses/Soft tissues: Clear paranasal sinuses and mastoid air cells.  Intracranial: Mild low density in the periventricular white matter likely related to small vessel disease. No mass lesion, hemorrhage, hydrocephalus, acute infarct, intra-axial, or extra-axial fluid collection.  IMPRESSION: Normal head CT for age.   Electronically Signed   By: Abigail Miyamoto M.D.   On: 02/05/2013 20:31    Medications:  I have  reviewed the patient's current medications. Scheduled: . allopurinol  100 mg Oral TID  . aspirin  325 mg Oral Daily  . carvedilol  6.25 mg Oral BID WC  . famotidine  20 mg Oral BID  . finasteride  5 mg Oral Daily  . rivaroxaban  15 mg Oral Q supper  . tamsulosin  0.4 mg Oral Daily    Assessment/Plan: No further events.  TIA likely.  On Xarelto.  Doppler shows no hemodynamically significant stenosis in the carotids bilaterally.  Atypical waveforms noted in the right vertebral artery.  Echocardiogram results are pending.    Recommendations: 1.  Would be concerned about change back to Coumadin since it will be a period of time before he is adequately anticoagulated.  If this is to happen may require a heparin bridge.     LOS: 1 day   Alexis Goodell, MD [facility] [phone] 02/06/2013  11:58 AM

## 2013-02-06 NOTE — Progress Notes (Signed)
UR completed 

## 2013-02-07 DIAGNOSIS — D649 Anemia, unspecified: Secondary | ICD-10-CM

## 2013-02-07 LAB — BASIC METABOLIC PANEL
BUN: 23 mg/dL (ref 6–23)
CALCIUM: 8.9 mg/dL (ref 8.4–10.5)
CO2: 23 mEq/L (ref 19–32)
Chloride: 104 mEq/L (ref 96–112)
Creatinine, Ser: 1.68 mg/dL — ABNORMAL HIGH (ref 0.50–1.35)
GFR calc Af Amer: 42 mL/min — ABNORMAL LOW (ref >90)
GFR, EST NON AFRICAN AMERICAN: 36 mL/min — AB (ref >90)
GLUCOSE: 89 mg/dL (ref 70–99)
POTASSIUM: 4.3 meq/L (ref 3.7–5.3)
SODIUM: 139 meq/L (ref 137–147)

## 2013-02-07 LAB — CBC
HEMATOCRIT: 33.4 % — AB (ref 39.0–52.0)
Hemoglobin: 10.8 g/dL — ABNORMAL LOW (ref 13.0–17.0)
MCH: 31.1 pg (ref 26.0–34.0)
MCHC: 32.3 g/dL (ref 30.0–36.0)
MCV: 96.3 fL (ref 78.0–100.0)
Platelets: 161 10*3/uL (ref 150–400)
RBC: 3.47 MIL/uL — ABNORMAL LOW (ref 4.22–5.81)
RDW: 16.9 % — AB (ref 11.5–15.5)
WBC: 7.7 10*3/uL (ref 4.0–10.5)

## 2013-02-07 LAB — GLUCOSE, CAPILLARY: Glucose-Capillary: 87 mg/dL (ref 70–99)

## 2013-02-07 MED ORDER — RIVAROXABAN 15 MG PO TABS: 15.0000 mg | ORAL_TABLET | Freq: Every day | ORAL | Status: DC

## 2013-02-07 NOTE — Discharge Summary (Signed)
Physician Discharge Summary  [name] KGU:[id number] DOB: 12-07-1927 DOA: 02/05/2013  PCP: Thurman Coyer, MD  Admit date: 02/05/2013 Discharge date: 02/07/2013  Time spent: Less than 30 minutes  Recommendations for Outpatient Follow-up:  1. Dr. Gwenlyn Perking, PCP in 5 days with repeat labs (CBC & BMP) 2. Dr. Darlin Coco, Cardiology on 02/13/13 at 10:45 AM. 3. Dr. Burney Gauze, Hematology on 02/22/13 at 1:45 PM 4. Dr. [name], EPS Cardiology on 03/13/13 at 2:15 PM  Discharge Diagnoses:  Active Problems:   Atrial fibrillation with controlled ventricular response   Chronic systolic congestive heart failure   Ataxia   TIA (transient ischemic attack)   Stage III chronic kidney disease   Anemia   Discharge Condition: Improved & Stable  Diet recommendation: Heart healthy diet  Filed Weights   02/06/13 0002  Weight: 89.3 kg (196 lb 13.9 oz)    History of present illness:  80 year old male with history of permanent A. fib on Coumadin anticoagulation, complete heart block status post PPM, cardiomyopathy with EF 25% (03/23/12), MDS, chronic anemia, GERD, chronic kidney disease, recent right foot surgery (Coumadin was held for 4-5 days and resumed post op approximately 3 days ago) was admitted with complaints of unsteady gait with tendency to lean to the right. He denied any extremity weakness, tingling, numbness, slurred speech or facial twisting. He was admitted for possible TIA/CVA.  Hospital Course:   Likely TIA/gait ataxia  - Ataxia had resolved by the time patient arrived in ED. Serial CT head negative for acute findings. Cannot do MRI due to pacemaker.  - Evaluated by neurology and has completed stroke workup  - Admitting INR was sub therapeutic likely secondary to withholding Coumadin for a few days prior to right foot surgery. Patient was placed on Xarelto during this admission and will be discharged on same which is probably better given prompt anticoagulation and no  need for frequent lab draws. Another concern about changing back to Coumadin would be that it would be a period of time before he is adequately anticoagulated and will need heparin bridge. Discussed with patient's hematologist who is in agreement. Unable to discuss with primary cardiologist who is on vacation. Case management evaluated and patient will be able to afford $30 co-pay.  Stage III chronic kidney disease  - Creatinine baseline. Outpatient followup with repeat labs   Chronic anemia/MDS  - Anemia stable. Outpatient followup with hematology    Recent right foot surgery  - Continue daily dressing. Outpatient followup with surgery on Friday. - Wound care team performed dressing change today.  Chronic systolic CHF/cardiomyopathy/LVEF 25-30% (unchanged compared to March 2014) -Compensated/stable  A. fib, status post AV Node ablation, s/p PPM  - Stable. Switched anticoagulation from Coumadin to Xarelto as discussed above.    Consultations:  Neurology   Wound care team   Procedures:  Right foot dressing change by wound care team     Discharge Exam:  Complaints:   patient denies gait imbalance or any other complaints. Eager to go home.   Filed Vitals:   02/06/13 2106 02/07/13 0510 02/07/13 0743 02/07/13 1150  BP: 120/63 110/69 107/66 113/63  Pulse: 80 79 79 80  Temp: 98 F (36.7 C) 98.6 F (37 C) 98.4 F (36.9 C) 98.4 F (36.9 C)  TempSrc: Oral Oral Oral Oral  Resp: 18 18 16 18   Height:      Weight:      SpO2: 97% 97% 98% 99%    General exam: Elderly male lying  comfortably in bed.  Respiratory system: Clear. No increased work of breathing.  Cardiovascular system: S1 & S2 heard, RRR. No JVD, murmurs, gallops, clicks or pedal edema. telemetry: V. paced rhythm in the 80s.  Gastrointestinal system: Abdomen is nondistended, soft and nontender. Normal bowel sounds heard.  Central nervous system: Alert and oriented. No focal neurological deficits.  Extremities:  Symmetric 5 x 5 power.Dressing over right foot, clean, dry and intact    Discharge Instructions      Discharge Orders   Future Appointments Provider Department Dept Phone   02/13/2013 10:45 AM Darlin Coco, MD [place] Office [phone]   02/22/2013 1:15 PM [place] [phone]   02/22/2013 1:45 PM [name], MD [place] [phone]   02/22/2013 2:15 PM [place] [phone]   03/13/2013 2:15 PM [name], MD [facility] [phone]   Future Orders Complete By Expires   Call MD for:  As directed    Comments:     Strokelike symptoms.   Diet - low sodium heart healthy  As directed    Increase activity slowly  As directed        Medication List    STOP taking these medications       warfarin 5 MG tablet  Commonly known as:  COUMADIN      TAKE these medications       allopurinol 100 MG tablet  Commonly known as:  ZYLOPRIM  Take 100 mg by mouth 3 (three) times daily.     carvedilol 6.25 MG tablet  Commonly known as:  COREG  TAKE ONE TABLET BY MOUTH TWICE DAILY     famotidine 20 MG tablet  Commonly known as:  PEPCID  Take 20 mg by mouth 2 (two) times daily.     finasteride 5 MG tablet  Commonly known as:  PROSCAR  Take 5 mg by mouth daily.     furosemide 40 MG tablet  Commonly known as:  LASIX  TAKE ONE TABLET BY MOUTH EVERY DAY     MULTI-VITAMIN PO  Take by mouth daily.     pyridoxine 100 MG tablet  Commonly known as:  B-6  Take 100 mg by mouth 2 (two) times daily.     Rivaroxaban 15 MG Tabs tablet  Commonly known as:  XARELTO  Take 1 tablet (15 mg total) by mouth daily with supper.     tamsulosin 0.4 MG Caps capsule  Commonly known as:  FLOMAX  Take 0.4 mg by mouth daily.       Follow-up Information   Follow up with CLOWARD,DAVIS L, MD. Schedule an appointment as soon as  possible for a visit in 5 days. (To be seen with repeat labs (CBC & BMP))    Specialty:  Internal Medicine   Contact information:   [place] [address] [phone]        The results of significant diagnostics from this hospitalization (including imaging, microbiology, ancillary and laboratory) are listed below for reference.    Significant Diagnostic Studies: Ct Head Without Contrast  02/06/2013   CLINICAL DATA:  TIA  EXAM: CT HEAD WITHOUT CONTRAST  TECHNIQUE: Contiguous axial images were obtained from the base of the skull through the vertex without intravenous contrast.  COMPARISON:  02/05/2013  FINDINGS: Unchanged lacunar infarct within the anterior aspect of the right  centrum semiovale. Scattered minimal periventricular hypodensities are grossly unchanged. No CT evidence of acute large territory infarct. No intraparenchymal, extra-axial mass or hemorrhage. Unchanged size and configuration of the ventricles and basilar cisterns. No midline shift. Limited visualization of the paranasal sinuses and mastoid air cells are normal. Post bilateral cataract surgery. Regional soft tissues are normal. No displaced calvarial fracture.  IMPRESSION: Grossly unchanged findings of microvascular ischemic disease without acute intracranial process.   Electronically Signed   By: Sandi Mariscal M.D.   On: 02/06/2013 19:51   Ct Head Wo Contrast  02/05/2013   CLINICAL DATA:  Weakness.  EXAM: CT HEAD WITHOUT CONTRAST  TECHNIQUE: Contiguous axial images were obtained from the base of the skull through the vertex without intravenous contrast.  COMPARISON:  None.  FINDINGS: Sinuses/Soft tissues: Clear paranasal sinuses and mastoid air cells.  Intracranial: Mild low density in the periventricular white matter likely related to small vessel disease. No mass lesion, hemorrhage, hydrocephalus, acute infarct, intra-axial, or extra-axial fluid collection.  IMPRESSION: Normal head CT for age.    Electronically Signed   By: Abigail Miyamoto M.D.   On: 02/05/2013 20:31    Microbiology: No results found for this or any previous visit (from the past 240 hour(s)).   Labs: Basic Metabolic Panel:  Recent Labs Lab 02/05/13 1445 02/07/13 0610  NA 137 139  K 4.8 4.3  CL 97 104  CO2 25 23  GLUCOSE 104* 89  BUN 21 23  CREATININE 1.60* 1.68*  CALCIUM 9.3 8.9   Liver Function Tests:  Recent Labs Lab 02/05/13 1445  AST 20  ALT 17  ALKPHOS 90  BILITOT 1.0  PROT 7.7  ALBUMIN 3.8   No results found for this basename: LIPASE, AMYLASE,  in the last 168 hours No results found for this basename: AMMONIA,  in the last 168 hours CBC:  Recent Labs Lab 02/05/13 1445 02/07/13 0610  WBC 5.0 7.7  NEUTROABS 2.2  --   HGB 11.5* 10.8*  HCT 35.4* 33.4*  MCV 95.9 96.3  PLT 184 161   Cardiac Enzymes:  Recent Labs Lab 02/06/13 0420 02/06/13 1245 02/06/13 2123  TROPONINI <0.30 <0.30 <0.30   BNP: BNP (last 3 results) No results found for this basename: PROBNP,  in the last 8760 hours CBG:  Recent Labs Lab 02/06/13 0112 02/06/13 2110 02/07/13 0742  GLUCAP 96 99 87    Additional labs: 1.  Fasting lipids: Cholesterol 117, triglycerides 110, HDL 42, LDL 53, VLDL 22  2.  INR on admission: 1.47  3.  hemoglobin A1c: 5 4. Carotid Dopplers 02/06/13: Summary: Findings suggest 1-39% internal carotid artery stenosis bilaterally. The right vertebral artery demonstrates antegrade flow with atypical waveforms. The right subclavian artery is patent with antegrade flow. The left vertebral artery is patent with antegrade flow. 5. 2-D echocardiogram 02/06/13: Study Conclusions  - Left ventricle: Septal dyskinesis. Inferobasal akinesis Anterior wall hpokinesis. The cavity size was moderately dilated. Wall thickness was normal. Systolic function was severely reduced. The estimated ejection fraction was in the range of 25% to 30%. - Left atrium: The atrium was mildly dilated. - Atrial  septum: No defect or patent foramen ovale was identified.      Signed:  Vernell Leep, MD, FACP, FHM. [facility] Pager [phone]  If 7PM-7AM, please contact night-coverage [email or web address] Password [email or web address] 02/07/2013, 12:40 PM

## 2013-02-07 NOTE — Discharge Instructions (Addendum)
Information on my medicine - XARELTO (Rivaroxaban)  This medication education was reviewed with me or my healthcare representative as part of my discharge preparation.  The pharmacist that spoke with me during my hospital stay was:  Deboraha Sprang, [facility]  Why was Xarelto prescribed for you? Xarelto was prescribed for you to reduce the risk of a blood clots forming after orthopedic surgery OR to reduce the risk of forming blood clots that cause a stroke if you have a medical condition called atrial fibrillation (a type of irregular heartbeat).  What do you need to know about xarelto ? Take your Xarelto ONCE DAILY at the same time every day with your evening meal. If you have difficulty swallowing the tablet whole, you may crush it and mix in applesauce just prior to taking your dose.  Take Xarelto exactly as prescribed by your doctor and DO NOT stop taking Xarelto without talking to the doctor who prescribed the medication.  Stopping without other stroke or VTE prevention medication to take the place of Xarelto may increase your risk of developing a new clot or stroke.  Refill your prescription before you run out.  After discharge, you should have regular check-up appointments with your healthcare provider that is prescribing your Xarelto.  In the future your dose may need to be changed if your kidney function or weight changes by a significant amount.  What do you do if you miss a dose? If you are taking Xarelto ONCE DAILY and you miss a dose, take it as soon as you remember on the same day then continue your regularly scheduled once daily regimen the next day. Do not take two doses of Xarelto at the same time.   Important Safety Information A possible side effect of Xarelto is bleeding. You should call your healthcare provider right away if you experience any of the following:   Bleeding from an injury or your nose that does not stop.   Unusual colored urine (red or dark brown)  or unusual colored stools (red or black).   Unusual bruising for unknown reasons.   A serious fall or if you hit your head (even if there is no bleeding).  Some medicines may interact with Xarelto and might increase your risk of bleeding while on Xarelto. To help avoid this, consult your healthcare provider or pharmacist prior to using any new prescription or non-prescription medications, including herbals, vitamins, non-steroidal anti-inflammatory drugs (NSAIDs) and supplements.  This website has more information on Xarelto: [email or web address]            Transient Ischemic Attack A transient ischemic attack (TIA) is a "warning stroke" that causes stroke-like symptoms. Unlike a stroke, a TIA does not cause permanent damage to the brain. The symptoms of a TIA can happen very fast and do not last long. It is important to know the symptoms of a TIA and what to do. This can help prevent a major stroke or death. CAUSES   A TIA is caused by a temporary blockage in an artery in the brain or neck (carotid artery). The blockage does not allow the brain to get the blood supply it needs and can cause different symptoms. The blockage can be caused by either:  A blood clot.  Fatty buildup (plaque) in a neck or brain artery. RISK FACTORS  High blood pressure (hypertension).  High cholesterol.  Diabetes mellitus.  Heart disease.  The build up of plaque in the blood vessels (peripheral artery disease or atherosclerosis).  The  build up of plaque in the blood vessels providing blood and oxygen to the brain (carotid artery stenosis).  An abnormal heart rhythm (atrial fibrillation).  Obesity.  Smoking.  Taking oral contraceptives (especially in combination with smoking).  Physical inactivity.  A diet high in fats, salt (sodium), and calories.  Alcohol use.  Use of illegal drugs (especially cocaine and methamphetamine).  Being male.  Being African American.  Being over the  age of 84.  Family history of stroke.  Previous history of blood clots, stroke, TIA, or heart attack.  Sickle cell disease. SYMPTOMS  TIA symptoms are the same as a stroke but are temporary. These symptoms usually develop suddenly, or may be newly present upon awakening from sleep:  Sudden weakness or numbness of the face, arm, or leg, especially on one side of the body.  Sudden trouble walking or difficulty moving arms or legs.  Sudden confusion.  Sudden personality changes.  Trouble speaking (aphasia) or understanding.  Difficulty swallowing.  Sudden trouble seeing in one or both eyes.  Double vision.  Dizziness.  Loss of balance or coordination.  Sudden severe headache with no known cause.  Trouble reading or writing.  Loss of bowel or bladder control.  Loss of consciousness. DIAGNOSIS  Your caregiver may be able to determine the presence or absence of a TIA based on your symptoms, history, and physical exam. Computed tomography (CT scan) of the brain is usually performed to help identify a TIA. Other tests may be done to diagnose a TIA. These tests may include:  Electrocardiography.  Continuous heart monitoring.  Echocardiography.  Carotid ultrasonography.  Magnetic resonance imaging (MRI).  A scan of the brain circulation.  Blood tests. PREVENTION  The risk of a TIA can be decreased by appropriately treating high blood pressure, high cholesterol, diabetes, heart disease, and obesity and by quitting smoking, limiting alcohol, and staying physically active. TREATMENT  Time is of the essence. Since the symptoms of TIA are the same as a stroke, it is important to seek treatment within 3 4 hours of the start of symptoms because you may receive a medicine to dissolve the clot (thrombolytic) that cannot be given after that time. Treatment options vary. Treatment options may include rest, oxygen, intravenous (IV) fluids, and medicines to thin the blood  (anticoagulants). Medicines and diet may be used to address diabetes, high blood pressure, and other risk factors. Measures will be taken to prevent short-term and long-term complications, including infection from breathing foreign material into the lungs (aspiration pneumonia), blood clots in the legs, and falls. Treatment options include procedures to either remove plaque in the carotid arteries or dilate carotid arteries that have narrowed due to plaque. Those procedures are:  Carotid endarterectomy.  Carotid angioplasty and stenting. HOME CARE INSTRUCTIONS   Take all medicines prescribed by your caregiver. Follow the directions carefully. Medicines may be used to control risk factors for a stroke. Be sure you understand all your medicine instructions.  You may be told to take aspirin or the anticoagulant warfarin. Warfarin needs to be taken exactly as instructed.  Taking too much or too little warfarin is dangerous. Too much warfarin increases the risk of bleeding. Too little warfarin continues to allow the risk for blood clots. While taking warfarin, you will need to have regular blood tests to measure your blood clotting time. A PT blood test measures how long it takes for blood to clot. Your PT is used to calculate another value called an INR. Your PT and  INR help your caregiver to adjust your dose of warfarin. The dose can change for many reasons. It is critically important that you take warfarin exactly as prescribed.  Many foods, especially foods high in vitamin K can interfere with warfarin and affect the PT and INR. Foods high in vitamin K include spinach, kale, broccoli, cabbage, collard and turnip greens, brussels sprouts, peas, cauliflower, seaweed, and parsley as well as beef and pork liver, green tea, and soybean oil. You should eat a consistent amount of foods high in vitamin K. Avoid major changes in your diet, or notify your caregiver before changing your diet. Arrange a visit with a  dietitian to answer your questions.  Many medicines can interfere with warfarin and affect the PT and INR. You must tell your caregiver about any and all medicines you take, this includes all vitamins and supplements. Be especially cautious with aspirin and anti-inflammatory medicines. Do not take or discontinue any prescribed or over-the-counter medicine except on the advice of your caregiver or pharmacist.  Warfarin can have side effects, such as excessive bruising or bleeding. You will need to hold pressure over cuts for longer than usual. Your caregiver or pharmacist will discuss other potential side effects.  Avoid sports or activities that may cause injury or bleeding.  Be mindful when shaving, flossing your teeth, or handling sharp objects.  Alcohol can change the body's ability to handle warfarin. It is best to avoid alcoholic drinks or consume only very small amounts while taking warfarin. Notify your caregiver if you change your alcohol intake.  Notify your dentist or other caregivers before procedures.  Eat a diet that includes 5 or more servings of fruits and vegetables each day. This may reduce the risk of stroke. Certain diets may be prescribed to address high blood pressure, high cholesterol, diabetes, or obesity.  A low-sodium, low-saturated fat, low-trans fat, low-cholesterol diet is recommended to manage high blood pressure.  A low-saturated fat, low-trans fat, low-cholesterol, and high-fiber diet may control cholesterol levels.  A controlled-carbohydrate, controlled-sugar diet is recommended to manage diabetes.  A reduced-calorie, low-sodium, low-saturated fat, low-trans fat, low-cholesterol diet is recommended to manage obesity.  Maintain a healthy weight.  Stay physically active. It is recommended that you get at least 30 minutes of activity on most or all days.  Do not smoke.  Limit alcohol use even if you are not taking warfarin. Moderate alcohol use is considered  to be:  No more than 2 drinks each day for men.  No more than 1 drink each day for nonpregnant women.  Stop drug abuse.  Home safety. A safe home environment is important to reduce the risk of falls. Your caregiver may arrange for specialists to evaluate your home. Having grab bars in the bedroom and bathroom is often important. Your caregiver may arrange for equipment to be used at home, such as raised toilets and a seat for the shower.  Follow all instructions for follow-up with your caregiver. This is very important. This includes any referrals and lab tests. Proper follow up can prevent a stroke or another TIA from occurring. SEEK MEDICAL CARE IF:  You have personality changes.  You have difficulty swallowing.  You are seeing double.  You have dizziness.  You have a fever.  You have skin breakdown. SEEK IMMEDIATE MEDICAL CARE IF:  Any of these symptoms may represent a serious problem that is an emergency. Do not wait to see if the symptoms will go away. Get medical help right away.  Call your local emergency services (911 in [place] Do not drive yourself to the hospital.  You have sudden weakness or numbness of the face, arm, or leg, especially on one side of the body.  You have sudden trouble walking or difficulty moving arms or legs.  You have sudden confusion.  You have trouble speaking (aphasia) or understanding.  You have sudden trouble seeing in one or both eyes.  You have a loss of balance or coordination.  You have a sudden, severe headache with no known cause.  You have new chest pain or an irregular heartbeat.  You have a partial or total loss of consciousness. MAKE SURE YOU:   Understand these instructions.  Will watch your condition.  Will get help right away if you are not doing well or get worse. Document Released: 10/15/2004 Document Revised: 12/23/2011 Document Reviewed: 02/28/2009 [facility] Patient Information 2014 [place].

## 2013-02-07 NOTE — Progress Notes (Signed)
Physical Therapy Treatment Patient Details Name: [name] MRN: [id number] DOB: 02-17-1927 Today's Date: 02/07/2013 Time: 1351-1405 PT Time Calculation (min): 14 min  PT Assessment / Plan / Recommendation  History of Present Illness 80 y.o. male admitted to [facility] on 02/05/13 with history of permanent A. fib on Coumadin anticoagulation, complete heart block status post PPM, cardiomyopathy with EF 25% (03/23/12), MDS, chronic anemia, GERD, chronic kidney disease, recent right foot surgery (Coumadin was held for 4-5 days and resumed post op approximately 3 days ago) was admitted with complaints of unsteady gait with tendency to lean to the right. He denied any extremity weakness, tingling, numbness, slurred speech or facial twisting. He was admitted for possible TIA/CVA.   PT Comments   Pt is progressing well with therapy.  He met all of his acute PT goals and all education completed.  He showed proficiency on the stairs using the reverse technique and RW.  No further acute or f/u PT needed at this time.  Pt is planning on d/c home with wife today.    Follow Up Recommendations  No PT follow up     Does the patient have the potential to tolerate intense rehabilitation    NA  Barriers to Discharge   None      Equipment Recommendations  None recommended by PT    Recommendations for Other Services   None  Frequency Min 3X/week   Progress towards PT Goals Progress towards PT goals: Goals met/education completed, patient discharged from PT  Plan Current plan remains appropriate    Precautions / Restrictions Precautions Precautions: Fall Precaution Comments: due to sore right foot.  Restrictions RLE Weight Bearing: Weight bearing as tolerated Other Position/Activity Restrictions: per pt he is supposed to WB through right heel wearing post-op shoe.     Pertinent Vitals/Pain See vitals flow sheet.     Mobility  Bed Mobility Overal bed mobility: Modified Independent General bed mobility  comments: used bed rails Transfers Overall transfer level: Modified independent Equipment used: Rolling walker (2 wheeled) General transfer comment: uses hands for transitions, but safe technique.   Ambulation/Gait Ambulation/Gait assistance: Supervision Ambulation Distance (Feet): 250 Feet Assistive device: Rolling walker (2 wheeled) Gait Pattern/deviations: Step-through pattern;Trunk flexed Gait velocity: WFL General Gait Details: supervision for safety and max verbal cues for getting closer to RW and standing up taller Stairs: Yes Stairs assistance: Min assist Stair Management: No rails;Step to pattern;Backwards;With walker Number of Stairs: 4 (2 steps x 2) General stair comments: min assist to stabilize RW only.  Per pt wife usually does this at home.  Verbal cues for correct leg sequencing and cues for correct placement of RW.        PT Goals (current goals can now be found in the care plan section) Acute Rehab PT Goals Patient Stated Goal: to go home  Visit Information  Last PT Received On: 02/07/13 Assistance Needed: +1 History of Present Illness: 80 y.o. male admitted to [facility] on 02/05/13 with history of permanent A. fib on Coumadin anticoagulation, complete heart block status post PPM, cardiomyopathy with EF 25% (03/23/12), MDS, chronic anemia, GERD, chronic kidney disease, recent right foot surgery (Coumadin was held for 4-5 days and resumed post op approximately 3 days ago) was admitted with complaints of unsteady gait with tendency to lean to the right. He denied any extremity weakness, tingling, numbness, slurred speech or facial twisting. He was admitted for possible TIA/CVA.    Subjective Data  Subjective: Pt reports he is ready to  go home and has no problem showing me how to do the stairs.  Patient Stated Goal: to go home   Cognition  Cognition Arousal/Alertness: Awake/alert Behavior During Therapy: WFL for tasks assessed/performed Overall Cognitive Status: Within  Functional Limits for tasks assessed       End of Session PT - End of Session Equipment Utilized During Treatment: Gait belt Activity Tolerance: Patient tolerated treatment well Patient left: in bed;with call bell/phone within reach;with family/visitor present   GP Functional Assessment Tool Used: assist level Functional Limitation: Mobility: Walking and moving around Mobility: Walking and Moving Around Current Status (X8335): 0 percent impaired, limited or restricted Mobility: Walking and Moving Around Goal Status (O2518): 0 percent impaired, limited or restricted Mobility: Walking and Moving Around Discharge Status [phone]): 0 percent impaired, limited or restricted   Lanessa Shill B. [place], [place], DPT [phone]   02/07/2013, 4:19 PM

## 2013-02-07 NOTE — Care Management Note (Signed)
    Page 1 of 1   02/07/2013     11:11:08 AM   CARE MANAGEMENT NOTE 02/07/2013  Patient:  [name], [name]   Account Number:  [id number]  Date Initiated:  02/07/2013  Documentation initiated by:  GRAVES-BIGELOW,Danilyn Cocke  Subjective/Objective Assessment:   Pt admitted for ataxia. Plan for d/c home with wife. Pt will be on new xarelto. Benefits check complete and 30 day free card provided to pt. Co pay will be 30.00 and pt is able to afford.     Action/Plan:   Arville Lime has medication available. RN Joelene Millin is aware of plan. No further needs from CM at this time.   Anticipated DC Date:  02/07/2013   Anticipated DC Plan:  [place]  CM consult  Medication Assistance      Choice offered to / List presented to:             Status of service:  Completed, signed off Medicare Important Message given?   (If response is "NO", the following Medicare IM given date fields will be blank) Date Medicare IM given:   Date Additional Medicare IM given:    Discharge Disposition:  HOME/SELF CARE  Per UR Regulation:  Reviewed for med. necessity/level of care/duration of stay  If discussed at [place] of Stay Meetings, dates discussed:    Comments:

## 2013-02-07 NOTE — Progress Notes (Signed)
Discharge instructions given.  No questions asked.  Also gave patient todays dose of xarelto.   Left via wheelchair with wife  [name]

## 2013-02-07 NOTE — Consult Note (Signed)
WOC wound consult note Reason for Consult: evaluate surgical site.  Pt with history with bunionectomy and gout related surgery 01/31/13 per Dr. Sharol Given.  Pt has been instructed to wash his incision daily with soap, cover with dry dressing and change daily.  His wife has been assisting the patient with this.  Wound type:surgical Measurement:6cm with sutures Wound PRF:FMBWGY incision Drainage (amount, consistency, odor) minimal sanguinous drainage  Periwound:intact, mild erythema at site  Dressing procedure/placement/frequency: continue to clean site daily with soap and water, cover with dry dressing. Wrap with ACE. WOC has performed this care today. Pt to be dced today. Notified Dr. Sharol Given of same.  Discussed POC with patient and bedside nurse.  Re consult if needed, will not follow at this time. Thanks  Alyanah Elliott [place], [place] [phone])

## 2013-02-13 ENCOUNTER — Encounter: Payer: Self-pay | Admitting: Cardiology

## 2013-02-13 ENCOUNTER — Ambulatory Visit (INDEPENDENT_AMBULATORY_CARE_PROVIDER_SITE_OTHER): Payer: Medicare Other | Admitting: Cardiology

## 2013-02-13 VITALS — BP 104/67 | HR 81 | Ht 75.0 in | Wt 208.0 lb

## 2013-02-13 DIAGNOSIS — I5022 Chronic systolic (congestive) heart failure: Secondary | ICD-10-CM

## 2013-02-13 DIAGNOSIS — D649 Anemia, unspecified: Secondary | ICD-10-CM

## 2013-02-13 DIAGNOSIS — I4891 Unspecified atrial fibrillation: Secondary | ICD-10-CM

## 2013-02-13 DIAGNOSIS — I509 Heart failure, unspecified: Secondary | ICD-10-CM

## 2013-02-13 NOTE — Assessment & Plan Note (Signed)
Patient has a history of chronic anemia.  His recent hemoglobins have been stable.  He is scheduled for a follow up with his oncologist next week.

## 2013-02-13 NOTE — Assessment & Plan Note (Signed)
While the patient was recently hospitalized he was switched from warfarin to an appropriate dose of Xarelto 15 mg daily.  He has stage III chronic kidney disease.

## 2013-02-13 NOTE — Progress Notes (Signed)
[name] Date of Birth:  [birth year] [address] [place], [place]  [address] [phone]  Fax   [phone]  HPI: This pleasant 80 year old gentleman is seen for a scheduled followup office visit. He has a history of chronic congestive heart failure. He was hospitalized for this at [facility] in 2009. He has a past history of established atrial fibrillation and had an ablation of his AV node with implantation of a biventricular pacemaker on 12/28/06 by Dr. Norville Haggard at [facility]. He was part of a research study however and the biventricular function was not turned on until recently when he switched his care to our office after the conclusion of the research study. The patient has been on chronic Coumadin for his atrial fibrillation. The patient has a history of depressed left ventricular function. His previous ejection fraction was 45-50%. A repeat echo was done on 03/23/12 to see if turning on the biventricular function has made any difference.  Unfortunately the ejection fraction is 25%.  Nonetheless the patient feels well and states he feels better than he has in the past several years.  He is active around the house and enjoys gardening. Since we last saw him he was hospitalized from 118/15 until 02/07/13 for problems with ataxia.  He was felt to of had a possible TIA.  He had an unsteady gait.  In retrospect he thinks that his ataxia may have been secondary to his Flomax and on his own he has reduced the dose to every other day and is not having any symptoms or side effects now.   Current Outpatient Prescriptions  Medication Sig Dispense Refill  . allopurinol (ZYLOPRIM) 100 MG tablet Take 100 mg by mouth 3 (three) times daily.       . carvedilol (COREG) 6.25 MG tablet TAKE ONE TABLET BY MOUTH TWICE DAILY  180 tablet  2  . famotidine (PEPCID) 20 MG tablet Take 20 mg by mouth 2 (two) times daily.        . finasteride (PROSCAR) 5 MG tablet Take 5 mg by  mouth daily.        . furosemide (LASIX) 40 MG tablet TAKE ONE TABLET BY MOUTH EVERY DAY  90 tablet  1  . Multiple Vitamin (MULTI-VITAMIN PO) Take by mouth daily.        Marland Kitchen pyridoxine (B-6) 100 MG tablet Take 100 mg by mouth 2 (two) times daily.        . Rivaroxaban (XARELTO) 15 MG TABS tablet Take 1 tablet (15 mg total) by mouth daily with supper.  30 tablet  0  . tamsulosin (FLOMAX) 0.4 MG CAPS capsule Take 0.4 mg by mouth daily.       No current facility-administered medications for this visit.    Allergies  Allergen Reactions  . Moxifloxacin Hcl In Nacl Other (See Comments) and Itching    Pt c/o being "out of his head" & itching  . Ramipril     Renal function worsened on ACEi.    Patient Active Problem List   Diagnosis Date Noted  . Stage III chronic kidney disease 02/06/2013  . Anemia 02/06/2013  . Ataxia 02/05/2013  . TIA (transient ischemic attack) 02/05/2013  . Complete heart block 09/12/2011  . MDS (myelodysplastic syndrome), low grade 02/10/2011  . Iron overload, transfusional 02/10/2011  . Shoulder impingement syndrome 12/23/2010  . Atrial fibrillation with controlled ventricular response 09/15/2010  . Chronic systolic congestive heart failure 09/15/2010  . Pacemaker 09/15/2010  History  Smoking status  . Former Smoker -- 1.00 packs/day for 25 years  . Types: Cigarettes  . Quit date: 02/10/1968  Smokeless tobacco  . Never Used    History  Alcohol Use  . Yes    Comment: 02/06/2013 "drank a little bit from age [age]"    Family History  Problem Relation Age of Onset  . Cancer Mother   . Melanoma Mother   . COPD Father   . Kidney disease Brother     Review of Systems: The patient denies any heat or cold intolerance.  No weight gain or weight loss.  The patient denies headaches or blurry vision.  There is no cough or sputum production.  The patient denies dizziness.  There is no hematuria or hematochezia.  The patient denies any muscle aches or  arthritis.  The patient denies any rash.  The patient denies frequent falling or instability.  There is no history of depression or anxiety.  All other systems were reviewed and are negative.   Physical Exam: Filed Vitals:   02/13/13 1047  BP: 104/67  Pulse: 81   the general appearance reveals a well-developed well-nourished gentleman in no distress.The head and neck exam reveals pupils equal and reactive.  Extraocular movements are full.  There is no scleral icterus.  The mouth and pharynx are normal.  The neck is supple.  The carotids reveal no bruits.  The jugular venous pressure is normal.  The  thyroid is not enlarged.  There is no lymphadenopathy.  The chest is clear to percussion and auscultation.  There are no rales or rhonchi.  Expansion of the chest is symmetrical.  The precordium is quiet.  The first heart sound is normal.  The second heart sound is physiologically split.  There is no murmur gallop rub or click.  There is no abnormal lift or heave.  The abdomen is soft and nontender.  The bowel sounds are normal.  The liver and spleen are not enlarged.  There are no abdominal masses.  There are no abdominal bruits.  Extremities reveal good pedal pulses.  There is no phlebitis or edema.  There is no cyanosis or clubbing.  Strength is normal and symmetrical in all extremities.  There is no lateralizing weakness.  There are no sensory deficits.  The skin is warm and dry.  There is no rash.  The right foot is in a walking boot following his recent bunion surgery by Dr. Sharol Given.     Assessment / Plan: The patient is doing well.  His weight is down  9 pounds.  No new cardiac symptoms.  Continue same medication and be rechecked in 6 months for office visit and EKG

## 2013-02-13 NOTE — ED Provider Notes (Signed)
Medical screening examination/treatment/procedure(s) were conducted as a shared visit with non-physician practitioner(s) and myself.  I personally evaluated the patient during the encounter.  EKG Interpretation    Date/Time:  Sunday February 05 2013 19:04:39 EST Ventricular Rate:  80 PR Interval:    QRS Duration: 179 QT Interval:  490 QTC Calculation: 565 R Axis:   -117 Text Interpretation:  paced rhythm  Right bundle branch block Inferior infarct, old Abnormal lateral Q waves Anterior infarct, old Confirmed by Tomaz Janis  MD, Tovia Kisner ([id number]) on 02/05/2013 7:08:08 PM            Pt with mild ataxia, no other neuro deficits.  Will admit for CVA work up  Malvin Johns, MD 02/13/13 [phone]

## 2013-02-13 NOTE — Assessment & Plan Note (Addendum)
The patient has a history of chronic systolic heart failure.  He has not been having any increased chest pain or shortness of breath.  He is recovering from foot surgery and sleeps in a recliner at the present time.  His last echocardiogram 03/23/12 showed ejection fraction 25%.  He had a repeat echocardiogram on 02/06/13 which showed ejection fraction of 25-30%.

## 2013-02-13 NOTE — Patient Instructions (Signed)
Your physician recommends that you continue on your current medications as directed. Please refer to the Current Medication list given to you today.  Your physician wants you to follow-up in: 6 month ov/ekg You will receive a reminder letter in the mail two months in advance. If you don't receive a letter, please call our office to schedule the follow-up appointment.  

## 2013-02-22 ENCOUNTER — Encounter: Payer: Self-pay | Admitting: Hematology & Oncology

## 2013-02-22 ENCOUNTER — Ambulatory Visit: Payer: Medicare Other

## 2013-02-22 ENCOUNTER — Other Ambulatory Visit (HOSPITAL_BASED_OUTPATIENT_CLINIC_OR_DEPARTMENT_OTHER): Payer: Medicare Other | Admitting: Lab

## 2013-02-22 ENCOUNTER — Ambulatory Visit (HOSPITAL_BASED_OUTPATIENT_CLINIC_OR_DEPARTMENT_OTHER): Payer: Medicare Other | Admitting: Hematology & Oncology

## 2013-02-22 VITALS — BP 113/56 | HR 88 | Temp 98.0°F | Resp 18 | Ht 74.0 in | Wt 210.0 lb

## 2013-02-22 DIAGNOSIS — I4891 Unspecified atrial fibrillation: Secondary | ICD-10-CM

## 2013-02-22 DIAGNOSIS — M7540 Impingement syndrome of unspecified shoulder: Secondary | ICD-10-CM

## 2013-02-22 DIAGNOSIS — D4620 Refractory anemia with excess of blasts, unspecified: Secondary | ICD-10-CM

## 2013-02-22 LAB — MANUAL DIFFERENTIAL (CHCC SATELLITE)
ALC: 2.2 10*3/uL (ref 0.9–3.3)
ANC (CHCC MAN DIFF): 3.5 10*3/uL (ref 1.5–6.5)
BASO: 1 % (ref 0–2)
EOS: 3 % (ref 0–7)
LYMPH: 29 % (ref 14–48)
MONO: 20 % — ABNORMAL HIGH (ref 0–13)
PLT EST ~~LOC~~: ADEQUATE
SEG: 47 % (ref 40–75)
nRBC: 1 % — ABNORMAL HIGH (ref 0–0)

## 2013-02-22 LAB — COMPREHENSIVE METABOLIC PANEL
ALK PHOS: 109 U/L (ref 39–117)
ALT: 38 U/L (ref 0–53)
AST: 39 U/L — ABNORMAL HIGH (ref 0–37)
Albumin: 3.5 g/dL (ref 3.5–5.2)
BUN: 37 mg/dL — ABNORMAL HIGH (ref 6–23)
CHLORIDE: 100 meq/L (ref 96–112)
CO2: 29 meq/L (ref 19–32)
Calcium: 9.1 mg/dL (ref 8.4–10.5)
Creatinine, Ser: 1.77 mg/dL — ABNORMAL HIGH (ref 0.50–1.35)
Glucose, Bld: 115 mg/dL — ABNORMAL HIGH (ref 70–99)
Potassium: 3.8 mEq/L (ref 3.5–5.3)
SODIUM: 136 meq/L (ref 135–145)
TOTAL PROTEIN: 7 g/dL (ref 6.0–8.3)
Total Bilirubin: 0.8 mg/dL (ref 0.2–1.2)

## 2013-02-22 LAB — CBC WITH DIFFERENTIAL (CANCER CENTER ONLY)
HCT: 31.1 % — ABNORMAL LOW (ref 38.7–49.9)
HGB: 9.9 g/dL — ABNORMAL LOW (ref 13.0–17.1)
MCH: 30.9 pg (ref 28.0–33.4)
MCHC: 31.8 g/dL — ABNORMAL LOW (ref 32.0–35.9)
MCV: 97 fL (ref 82–98)
PLATELETS: 158 10*3/uL (ref 145–400)
RBC: 3.2 10*6/uL — AB (ref 4.20–5.70)
RDW: 15.2 % (ref 11.1–15.7)
WBC: 7.5 10*3/uL (ref 4.0–10.0)

## 2013-02-22 LAB — RETICULOCYTES (CHCC)
ABS RETIC: 33 10*3/uL (ref 19.0–186.0)
RBC.: 3.3 MIL/uL — AB (ref 4.22–5.81)
RETIC CT PCT: 1 % (ref 0.4–2.3)

## 2013-02-22 LAB — CHCC SATELLITE - SMEAR

## 2013-02-22 MED ORDER — METAXALONE 800 MG PO TABS: ORAL_TABLET | ORAL | Status: DC

## 2013-02-22 NOTE — Progress Notes (Signed)
Patient comes in today, CBC checked and seen by Dr. Marin Olp.  Aranasp injection not given per Dr. Antonieta Pert orders..  Hgb was 9.9 .

## 2013-02-22 NOTE — Progress Notes (Signed)
This office note has been dictated.

## 2013-02-23 LAB — IRON AND TIBC CHCC
%SAT: 23 % (ref 20–55)
Iron: 34 ug/dL — ABNORMAL LOW (ref 42–163)
TIBC: 146 ug/dL — ABNORMAL LOW (ref 202–409)
UIBC: 112 ug/dL — ABNORMAL LOW (ref 117–376)

## 2013-02-23 LAB — FERRITIN CHCC: Ferritin: 3999 ng/ml — ABNORMAL HIGH (ref 22–316)

## 2013-02-23 NOTE — Progress Notes (Signed)
DIAGNOSES: 1. Refractory anemia - low grade. 2. Atrial fibrillation.  CURRENT THERAPIES: 1. Aranesp 300 mcg subcu as needed for hemoglobin less than 10. 2. Xarelto 15 mg p.o. daily.  INTERIM HISTORY:  Mr. Badley comes in for a followup.  Unfortunately, he was hospitalized in January.  It sounds like he may have had a TIA.  He had the standard workup for this.  Nothing was seen or appeared to be new from what I could tell.  CT of the brain was done on February 06, 2013.  He had an unchanged lacunar infarct in the right centrum semiovale.  There is some microvascular ischemic disease.   He is having a lot of low back pain right now.  He apparently woke up on Friday with a lot of low back pain.  He went to urgent care.  His x-rays were done which showed some arthritis.  He has had no problems with bleeding.  He is now on Xarelto.  He has had no leg swelling.  He has had no cough.  He has had no shortness of breath.  There has been no abdominal pain.  We are watching his iron studies.  In early January, his ferritin was 1924, with an iron saturation of 94%.  PHYSICAL EXAMINATION:  General:  This is a fairly well-developed, well- nourished white gentleman, in no obvious distress.  Vital Signs: Temperature of 98, pulse 88, respiratory rate 18, blood pressure 113/56, weight is 210 pounds.  Head and Neck:  Normocephalic, atraumatic skull. There are no ocular or oral lesions.  There are no palpable cervical or supraclavicular lymph nodes.  Lungs:  Clear bilaterally.  Cardiac: Regular rate and rhythm with a normal S1 and S2.  There are no murmurs, rubs, or bruits.  Abdomen:  Soft.  He has good bowel sounds.  There is no fluid wave.  There is no palpable abdominal mass.  There is no palpable hepatosplenomegaly.  Extremities:  No clubbing, cyanosis, or edema.  Skin:  No rashes, ecchymoses, or petechiae.  LABORATORY STUDIES:  White cell count is 7.5, hemoglobin 9.9, hematocrit 31.1,  platelet count 158.  MCV is 97.  IMPRESSION:  Mr. Grasse is a nice 80 year old gentleman.  He has refractory anemia.  We have not had to treat him for many years.  We really have not had to give him any transfusions.  He has gotten Aranesp maybe once or twice a year.  I still do not think he needs any Aranesp today.  He then felt too bad from his blood.  We are watching his iron studies.  This is some that we are going to have to be really cautious with.  His ferritin and iron saturation are certainly getting more significant.  We may have to consider iron chelation therapy for him, which I think will be a real problem for him to try to do.  We will go ahead and plan to get him back to see me in another month.  I did give him a prescription for Skelaxin to try to help with his low back pain.    ______________________________ [name], M.D. PRE/MEDQ  D:  02/22/2013  T:  02/23/2013  Job:  [id number]

## 2013-03-06 ENCOUNTER — Other Ambulatory Visit: Payer: Self-pay | Admitting: *Deleted

## 2013-03-06 MED ORDER — RIVAROXABAN 15 MG PO TABS: 15.0000 mg | ORAL_TABLET | Freq: Every day | ORAL | Status: DC

## 2013-03-13 ENCOUNTER — Encounter: Payer: Self-pay | Admitting: Internal Medicine

## 2013-03-13 ENCOUNTER — Ambulatory Visit (INDEPENDENT_AMBULATORY_CARE_PROVIDER_SITE_OTHER): Payer: Medicare Other | Admitting: Internal Medicine

## 2013-03-13 VITALS — BP 100/60 | HR 89 | Ht 75.0 in | Wt 209.0 lb

## 2013-03-13 DIAGNOSIS — I509 Heart failure, unspecified: Secondary | ICD-10-CM

## 2013-03-13 DIAGNOSIS — I4891 Unspecified atrial fibrillation: Secondary | ICD-10-CM

## 2013-03-13 DIAGNOSIS — I5022 Chronic systolic (congestive) heart failure: Secondary | ICD-10-CM

## 2013-03-13 DIAGNOSIS — Z950 Presence of cardiac pacemaker: Secondary | ICD-10-CM

## 2013-03-13 DIAGNOSIS — I442 Atrioventricular block, complete: Secondary | ICD-10-CM

## 2013-03-13 DIAGNOSIS — I4821 Permanent atrial fibrillation: Secondary | ICD-10-CM | POA: Insufficient documentation

## 2013-03-13 LAB — MDC_IDC_ENUM_SESS_TYPE_INCLINIC
Battery Remaining Longevity: 33 mo
Battery Voltage: 2.95 V
Brady Statistic RV Percent Paced: 98 %
Date Time Interrogation Session: 20150223151853
Lead Channel Impedance Value: 687 Ohm
Lead Channel Impedance Value: 824 Ohm
Lead Channel Pacing Threshold Amplitude: 2 V
Lead Channel Pacing Threshold Pulse Width: 0.8 ms
Lead Channel Setting Pacing Amplitude: 2.5 V
Lead Channel Setting Pacing Pulse Width: 0.5 ms
Lead Channel Setting Pacing Pulse Width: 0.8 ms
MDC IDC MSMT LEADCHNL RA IMPEDANCE VALUE: 0 Ohm
MDC IDC MSMT LEADCHNL RV PACING THRESHOLD AMPLITUDE: 0.5 V
MDC IDC MSMT LEADCHNL RV PACING THRESHOLD PULSEWIDTH: 0.5 ms
MDC IDC SET LEADCHNL LV PACING AMPLITUDE: 3 V
MDC IDC SET LEADCHNL RV SENSING SENSITIVITY: 2.8 mV

## 2013-03-13 NOTE — Progress Notes (Signed)
PCP: Thurman Coyer, MD Primary Cardiologist:  Dr Ellan Lambert [name] is a 80 y.o. male who presents today for routine electrophysiology followup.  Since last being seen in our clinic, the patient reports doing very well.  He recalls no clinical change with turning on biventricular pacing.  He remains active in his yard in the summer but has been less active this winter.  Today, he denies symptoms of palpitations, chest pain, shortness of breath,  lower extremity edema, dizziness, presyncope, or syncope.  The patient is otherwise without complaint today.   Past Medical History  Diagnosis Date  . Bursitis     OF THE RIGHT SHOULDER  . Tinnitus   . Easy bruising     SOME FROM HIS COUMADIN  . Permanent atrial fibrillation     s/p AV nodal ablation  . Depressed ejection fraction 10    EF previously 45-50%, s/p AV nodal ablation and biv pacemaker implant, previously enrolled in Block HF and was randomized to RV pacing only)  . Chronic anemia     RECIEVES ARANESP SHOTS IF HIS HEMOGLOBIN FALLS BELOW 11  . Blood transfusion     "several; Dr. Deberah Pelton" (02/06/2013)  . GERD (gastroesophageal reflux disease)   . MDS (myelodysplastic syndrome), low grade 02/10/2011  . Iron overload, transfusional 02/10/2011  . Complete heart block 12/28/06    s/p AV nodal ablation and BIV pacemaker implant at [facility] by Dr Rosita Fire  . CHF (congestive heart failure)   . Chronic bronchitis   . Pneumonia     "high school; [employer]; twice since, last time 12/ 2014" (02/06/2013)  . H/O hiatal hernia   . Arthritis     "hands, back" (02/06/2013)  . Gout   . Kidney stones 1985   Past Surgical History  Procedure Laterality Date  . Total knee arthroplasty Bilateral     right Dec.2011,left June 2012  . Joint replacement    . Pilonidal cyst excision  1970's  . Kidney stone surgery  1985    "tried to go up my penis; had to cut my stomach and go in for lodged stone"  . Av node ablation  12/08    at [facility]  . Insert / replace / remove pacemaker  12/28/2006    Medtronic Insync BiV pacemaker implanted at [facility] by Dr Rosita Fire, enrolled in the Block HF study (now completed)  . Bunionectomy Bilateral 11/2012- 01/2013    "left-right; great toes; gout and bunion OR" (02/06/2013)  . Inguinal hernia repair Right   . Lithotripsy  1985  . Kidney stone surgery  1986    "put a tube in my back"  . Cataract extraction w/ intraocular lens  implant, bilateral Bilateral     Current Outpatient Prescriptions  Medication Sig Dispense Refill  . allopurinol (ZYLOPRIM) 100 MG tablet Take 100 mg by mouth 3 (three) times daily.       . carvedilol (COREG) 6.25 MG tablet TAKE ONE TABLET BY MOUTH TWICE DAILY  180 tablet  2  . famotidine (PEPCID) 20 MG tablet Take 20 mg by mouth 2 (two) times daily.        . finasteride (PROSCAR) 5 MG tablet Take 5 mg by mouth daily.        . furosemide (LASIX) 40 MG tablet TAKE ONE TABLET BY MOUTH EVERY DAY  90 tablet  1  . Multiple Vitamin (MULTI-VITAMIN PO) Take by mouth daily.        Marland Kitchen pyridoxine (B-6)  100 MG tablet Take 100 mg by mouth 2 (two) times daily.        . Rivaroxaban (XARELTO) 15 MG TABS tablet Take 1 tablet (15 mg total) by mouth daily with supper.  30 tablet  11  . tamsulosin (FLOMAX) 0.4 MG CAPS capsule Take 0.4 mg by mouth daily.       No current facility-administered medications for this visit.    Physical Exam: Filed Vitals:   03/13/13 1408  BP: 100/60  Pulse: 89  Height: 6\' 3"  (1.905 m)  Weight: 209 lb (94.802 kg)    GEN- The patient is well appearing, alert and oriented x 3 today.   Head- normocephalic, atraumatic Eyes-  Sclera clear, conjunctiva pink Ears- hearing intact Oropharynx- clear Lungs- Clear to ausculation bilaterally, normal work of breathing Chest- pacemaker pocket is well healed Heart- Regular rate and rhythm, no murmurs, rubs or gallops, PMI not laterally displaced GI- soft, NT, ND, + BS Extremities- no clubbing,  cyanosis, or edema  Pacemaker interrogation- reviewed in detail today,  See PACEART report  Assessment and Plan:  1. Complete heart block Normal BiV pacemaker function See Pace Art report No changes today  2. Permanent afib Continue coumadin  3. Chronic systolic dysfunction Stable No changes tdoay  Follow-up with Dr Mare Ferrari as scheduled.  Return to the device clinic in 6 months Either me or Jerene Pitch can see again in 1 year

## 2013-03-13 NOTE — Patient Instructions (Signed)
Your physician wants you to follow-up in: 6 months in device clinic and 12 months with Dr. Allred. You will receive a reminder letter in the mail two months in advance. If you don't receive a letter, please call our office to schedule the follow-up appointment.     

## 2013-03-20 ENCOUNTER — Telehealth: Payer: Self-pay | Admitting: Internal Medicine

## 2013-03-20 NOTE — Telephone Encounter (Signed)
New message  Patient has pacemaker. His pulse normally is 80, today he took his BP and pulse and his pulse is 65. He feels fine, but wants to make sure this is okay. Please call and advise.

## 2013-03-20 NOTE — Telephone Encounter (Signed)
Spoke with patient and let him know we can bring him in and check his battery.  He says his HR is consistently staying at 65.  Will check as just a week ago his Estimated longevity was 2.5 years  He will come into device clinic next week.  He feels fine just called because it was a change.

## 2013-03-23 ENCOUNTER — Encounter: Payer: Self-pay | Admitting: Hematology & Oncology

## 2013-03-23 ENCOUNTER — Ambulatory Visit (HOSPITAL_BASED_OUTPATIENT_CLINIC_OR_DEPARTMENT_OTHER): Payer: Medicare Other | Admitting: Hematology & Oncology

## 2013-03-23 ENCOUNTER — Other Ambulatory Visit (HOSPITAL_BASED_OUTPATIENT_CLINIC_OR_DEPARTMENT_OTHER): Payer: Medicare Other | Admitting: Lab

## 2013-03-23 VITALS — BP 109/52 | HR 65 | Temp 97.9°F | Resp 18 | Ht 74.0 in | Wt 210.0 lb

## 2013-03-23 DIAGNOSIS — D4620 Refractory anemia with excess of blasts, unspecified: Secondary | ICD-10-CM

## 2013-03-23 DIAGNOSIS — I4891 Unspecified atrial fibrillation: Secondary | ICD-10-CM

## 2013-03-23 LAB — CBC WITH DIFFERENTIAL (CANCER CENTER ONLY)
HCT: 33 % — ABNORMAL LOW (ref 38.7–49.9)
HEMOGLOBIN: 10.4 g/dL — AB (ref 13.0–17.1)
MCH: 30.5 pg (ref 28.0–33.4)
MCHC: 31.5 g/dL — ABNORMAL LOW (ref 32.0–35.9)
MCV: 97 fL (ref 82–98)
Platelets: 132 10*3/uL — ABNORMAL LOW (ref 145–400)
RBC: 3.41 10*6/uL — AB (ref 4.20–5.70)
RDW: 15.4 % (ref 11.1–15.7)
WBC: 6.1 10*3/uL (ref 4.0–10.0)

## 2013-03-23 LAB — RETICULOCYTES (CHCC)
ABS Retic: 20.9 10*3/uL (ref 19.0–186.0)
RBC.: 3.49 MIL/uL — ABNORMAL LOW (ref 4.22–5.81)
Retic Ct Pct: 0.6 % (ref 0.4–2.3)

## 2013-03-23 LAB — MANUAL DIFFERENTIAL (CHCC SATELLITE)
ALC: 2.5 10*3/uL (ref 0.9–3.3)
ANC (CHCC HP manual diff): 2.5 10*3/uL (ref 1.5–6.5)
BASO: 1 % (ref 0–2)
LYMPH: 42 % (ref 14–48)
MONO: 16 % — ABNORMAL HIGH (ref 0–13)
PLT EST ~~LOC~~: DECREASED
Platelet Morphology: NORMAL
SEG: 41 % (ref 40–75)

## 2013-03-23 LAB — CHCC SATELLITE - SMEAR

## 2013-03-23 NOTE — Progress Notes (Signed)
  DIAGNOSIS:  Refractory anemia Atrial fibrillation TIA  CURRENT THERAPY:  Aranesp 300 mcg subcutaneous as needed hemoglobin less than 10 Xarelto 15 mg by mouth daily  INTERIM HISTORY:  [name] comes in for followup. Is doing okay. He has a pacemaker in. Is on Xarelto. This is working well for him. He does not having to get his blood checked.   His refractory anemia and has not been a problem. We are watching iron studies. His last ferritin was 4000 but her saturation was only 23%. He does have arthritis so the ferritin could be an acute phase reactant.  He has no wheezing. There is no change in bowel or bladder habits. He's had no cough.  PHYSICAL EXAMINATION:  Well developed well nourished gentleman. Vital signs show blood pressure 109/52. Pulse 65. Temperature 97.9. Hent exam is negative. Lungs clear. Cardiac exam regular in rhythm. No murmurs. Abdomen soft. No liver or spleen. Extremities some chronic 1+ edema.   LABORATORY STUDIES:  6.1 is his white cell count. Hemoglobin 10.4. Hematocrit 33. Platelet count 132. MCV 97.  Blood smear is unremarkable. No blasts. No nucleated red cells. No immature myeloid cells.   IMPRESSION:  [name] is an 80 year old gentleman. He has long-term refractory anemia. We've been following him for 8 or 9 years. He's done well. We've never had to transfuse him. Aranesp works. Again the ferritin is mostly an acute phase reactant.    We'll plan to get him back in 6 weeks. Hopefully in his blood count still looks good, then we will see about removing his appointments out further.    He only has to see his cardiologist once or twice a year.   [name], MD 03/23/2013

## 2013-03-24 LAB — IRON AND TIBC CHCC
%SAT: 43 % (ref 20–55)
Iron: 78 ug/dL (ref 42–163)
TIBC: 181 ug/dL — ABNORMAL LOW (ref 202–409)
UIBC: 103 ug/dL — AB (ref 117–376)

## 2013-03-24 LAB — FERRITIN CHCC: Ferritin: 1475 ng/ml — ABNORMAL HIGH (ref 22–316)

## 2013-04-10 ENCOUNTER — Telehealth: Payer: Self-pay | Admitting: Internal Medicine

## 2013-04-10 NOTE — Telephone Encounter (Signed)
Spoke to patient about HR staying at 65bpm. ?ERI. Appt made for 3-24 @9 :00 for further evaluation.

## 2013-04-10 NOTE — Telephone Encounter (Signed)
New message  Pt called requests that his pacemaker is set back to 80 beats per min vs 65. Please call back to assist.

## 2013-04-11 ENCOUNTER — Encounter (HOSPITAL_COMMUNITY)
Admission: AD | Disposition: A | Discharge status: Home / Self Care | Payer: Self-pay | Source: Ambulatory Visit | Attending: Internal Medicine

## 2013-04-11 ENCOUNTER — Ambulatory Visit (HOSPITAL_COMMUNITY)
Admission: AD | Admit: 2013-04-11 | Discharge: 2013-04-11 | Disposition: A | Discharge status: Home / Self Care | Payer: Medicare Other | Source: Ambulatory Visit | Attending: Internal Medicine | Admitting: Internal Medicine

## 2013-04-11 ENCOUNTER — Encounter: Payer: Self-pay | Admitting: Cardiology

## 2013-04-11 ENCOUNTER — Ambulatory Visit (INDEPENDENT_AMBULATORY_CARE_PROVIDER_SITE_OTHER): Payer: Medicare Other | Admitting: Cardiology

## 2013-04-11 ENCOUNTER — Encounter (HOSPITAL_COMMUNITY): Payer: Self-pay | Admitting: *Deleted

## 2013-04-11 ENCOUNTER — Encounter: Payer: Self-pay | Admitting: *Deleted

## 2013-04-11 VITALS — BP 100/50 | HR 70

## 2013-04-11 DIAGNOSIS — Z96659 Presence of unspecified artificial knee joint: Secondary | ICD-10-CM | POA: Insufficient documentation

## 2013-04-11 DIAGNOSIS — Z9889 Other specified postprocedural states: Secondary | ICD-10-CM

## 2013-04-11 DIAGNOSIS — I442 Atrioventricular block, complete: Secondary | ICD-10-CM | POA: Insufficient documentation

## 2013-04-11 DIAGNOSIS — Z7901 Long term (current) use of anticoagulants: Secondary | ICD-10-CM | POA: Insufficient documentation

## 2013-04-11 DIAGNOSIS — D469 Myelodysplastic syndrome, unspecified: Secondary | ICD-10-CM | POA: Insufficient documentation

## 2013-04-11 DIAGNOSIS — I4891 Unspecified atrial fibrillation: Secondary | ICD-10-CM

## 2013-04-11 DIAGNOSIS — Z950 Presence of cardiac pacemaker: Secondary | ICD-10-CM

## 2013-04-11 DIAGNOSIS — M109 Gout, unspecified: Secondary | ICD-10-CM | POA: Insufficient documentation

## 2013-04-11 DIAGNOSIS — J42 Unspecified chronic bronchitis: Secondary | ICD-10-CM | POA: Insufficient documentation

## 2013-04-11 DIAGNOSIS — D630 Anemia in neoplastic disease: Secondary | ICD-10-CM | POA: Insufficient documentation

## 2013-04-11 DIAGNOSIS — Z45018 Encounter for adjustment and management of other part of cardiac pacemaker: Secondary | ICD-10-CM | POA: Insufficient documentation

## 2013-04-11 DIAGNOSIS — Z45010 Encounter for checking and testing of cardiac pacemaker pulse generator [battery]: Secondary | ICD-10-CM

## 2013-04-11 DIAGNOSIS — K219 Gastro-esophageal reflux disease without esophagitis: Secondary | ICD-10-CM | POA: Insufficient documentation

## 2013-04-11 DIAGNOSIS — Z87891 Personal history of nicotine dependence: Secondary | ICD-10-CM | POA: Insufficient documentation

## 2013-04-11 DIAGNOSIS — Z01810 Encounter for preprocedural cardiovascular examination: Secondary | ICD-10-CM

## 2013-04-11 DIAGNOSIS — I509 Heart failure, unspecified: Secondary | ICD-10-CM | POA: Insufficient documentation

## 2013-04-11 DIAGNOSIS — I428 Other cardiomyopathies: Secondary | ICD-10-CM

## 2013-04-11 HISTORY — PX: PERMANENT PACEMAKER GENERATOR CHANGE: SHX6022

## 2013-04-11 LAB — MDC_IDC_ENUM_SESS_TYPE_INCLINIC
Battery Remaining Longevity: 0 mo
Brady Statistic RV Percent Paced: 97 %
Date Time Interrogation Session: 20150324094007
Implantable Pulse Generator Model: 8042
Lead Channel Impedance Value: 0 Ohm
Lead Channel Impedance Value: 0 Ohm
Lead Channel Pacing Threshold Amplitude: 1 V
Lead Channel Setting Pacing Amplitude: 2.5 V
Lead Channel Setting Pacing Amplitude: 3 V
MDC IDC MSMT BATTERY VOLTAGE: 2.77 V
MDC IDC MSMT LEADCHNL LV PACING THRESHOLD PULSEWIDTH: 0.8 ms
MDC IDC MSMT LEADCHNL RV IMPEDANCE VALUE: 779 Ohm
MDC IDC MSMT LEADCHNL RV PACING THRESHOLD AMPLITUDE: 1 V
MDC IDC MSMT LEADCHNL RV PACING THRESHOLD PULSEWIDTH: 0.5 ms
MDC IDC SET LEADCHNL LV PACING PULSEWIDTH: 0.8 ms
MDC IDC SET LEADCHNL RV PACING PULSEWIDTH: 0.5 ms
MDC IDC SET LEADCHNL RV SENSING SENSITIVITY: 2.8 mV

## 2013-04-11 LAB — CBC WITH DIFFERENTIAL/PLATELET
Basophils Absolute: 0 10*3/uL (ref 0.0–0.1)
Basophils Relative: 0.3 % (ref 0.0–3.0)
EOS ABS: 0.1 10*3/uL (ref 0.0–0.7)
Eosinophils Relative: 0.8 % (ref 0.0–5.0)
HEMATOCRIT: 34.3 % — AB (ref 39.0–52.0)
Hemoglobin: 11.2 g/dL — ABNORMAL LOW (ref 13.0–17.0)
Lymphocytes Relative: 25.6 % (ref 12.0–46.0)
Lymphs Abs: 2.7 10*3/uL (ref 0.7–4.0)
MCHC: 32.7 g/dL (ref 30.0–36.0)
MCV: 94.3 fl (ref 78.0–100.0)
MONO ABS: 3.2 10*3/uL — AB (ref 0.1–1.0)
Monocytes Relative: 30.3 % — ABNORMAL HIGH (ref 3.0–12.0)
NEUTROS ABS: 4.6 10*3/uL (ref 1.4–7.7)
Neutrophils Relative %: 43 % (ref 43.0–77.0)
PLATELETS: 216 10*3/uL (ref 150.0–400.0)
RBC: 3.64 Mil/uL — ABNORMAL LOW (ref 4.22–5.81)
RDW: 15.8 % — AB (ref 11.5–14.6)
WBC: 10.6 10*3/uL — ABNORMAL HIGH (ref 4.5–10.5)

## 2013-04-11 LAB — SURGICAL PCR SCREEN
MRSA, PCR: NEGATIVE
STAPHYLOCOCCUS AUREUS: NEGATIVE

## 2013-04-11 LAB — BASIC METABOLIC PANEL
BUN: 31 mg/dL — ABNORMAL HIGH (ref 6–23)
CO2: 29 meq/L (ref 19–32)
Calcium: 9.6 mg/dL (ref 8.4–10.5)
Chloride: 103 mEq/L (ref 96–112)
Creatinine, Ser: 1.8 mg/dL — ABNORMAL HIGH (ref 0.4–1.5)
GFR: 38.17 mL/min — AB (ref >60.00)
GLUCOSE: 91 mg/dL (ref 70–99)
POTASSIUM: 3.8 meq/L (ref 3.5–5.1)
Sodium: 137 mEq/L (ref 135–145)

## 2013-04-11 SURGERY — PERMANENT PACEMAKER GENERATOR CHANGE
Anesthesia: LOCAL

## 2013-04-11 MED ORDER — MUPIROCIN 2 % EX OINT
TOPICAL_OINTMENT | Freq: Once | CUTANEOUS | Status: AC
  Administered 2013-04-11: 1 via NASAL
  Filled 2013-04-11: qty 22

## 2013-04-11 MED ORDER — LIDOCAINE HCL (PF) 1 % IJ SOLN
INTRAMUSCULAR | Status: AC
  Filled 2013-04-11: qty 30

## 2013-04-11 MED ORDER — MUPIROCIN 2 % EX OINT
TOPICAL_OINTMENT | CUTANEOUS | Status: AC
  Administered 2013-04-11: 1 via NASAL
  Filled 2013-04-11: qty 22

## 2013-04-11 MED ORDER — CHLORHEXIDINE GLUCONATE 4 % EX LIQD
60.0000 mL | Freq: Once | CUTANEOUS | Status: DC
  Filled 2013-04-11: qty 60

## 2013-04-11 MED ORDER — FENTANYL CITRATE 0.05 MG/ML IJ SOLN
INTRAMUSCULAR | Status: AC
  Filled 2013-04-11: qty 2

## 2013-04-11 MED ORDER — SODIUM CHLORIDE 0.9 % IR SOLN
80.0000 mg | Status: DC
  Filled 2013-04-11: qty 2

## 2013-04-11 MED ORDER — ACETAMINOPHEN 325 MG PO TABS: 325.0000 mg | ORAL_TABLET | ORAL | Status: DC | PRN

## 2013-04-11 MED ORDER — SODIUM CHLORIDE 0.9 % IV SOLN
INTRAVENOUS | Status: DC
  Administered 2013-04-11: 15:00:00 via INTRAVENOUS

## 2013-04-11 MED ORDER — ONDANSETRON HCL 4 MG/2ML IJ SOLN: 4.0000 mg | Freq: Four times a day (QID) | INTRAMUSCULAR | Status: DC | PRN

## 2013-04-11 MED ORDER — CEFAZOLIN SODIUM-DEXTROSE 2-3 GM-% IV SOLR
2.0000 g | INTRAVENOUS | Status: DC
  Filled 2013-04-11: qty 50

## 2013-04-11 MED ORDER — MIDAZOLAM HCL 5 MG/5ML IJ SOLN
INTRAMUSCULAR | Status: AC
  Filled 2013-04-11: qty 5

## 2013-04-11 NOTE — Progress Notes (Addendum)
 ELECTROPHYSIOLOGY OFFICE NOTE   Patient ID: [name] MRN: [id number], DOB/AGE: 04/10/1927   Date of Visit: 04/11/2013  Primary Physician: Davis Cloward, MD Primary Cardiologist: Brackbill, MD Primary EP: Allred, MD Reason for Visit: CRT-P battery at ERI  History of Present Illness  [name] is a 80 y.o. male with NICM, permanent AFib s/p CRT-P implant and AV node ablation (by Dr. Simmons at [facility] in 2008) who presents today as an add on for electrophysiology followup. His BiV PPM battery has reached ERI prematurely. At his last visit with Dr. Allred 03/13/2013 his estimated longevity was 2.5 years. He presented to device clinic today because he "just didn't feel well" and his heart rate at home was 65 bpm which he knew was not normal for him as he is typically paced at 80 bpm. He denies CP or SOB. He denies palpitations, dizziness or syncope. He denies LE swelling, orthopnea or PND. On interrogation of his device his battery reached ERI on 03/14/2013.   Past Medical History Past Medical History  Diagnosis Date  . Bursitis     OF THE RIGHT SHOULDER  . Tinnitus   . Easy bruising     SOME FROM HIS COUMADIN  . Permanent atrial fibrillation     s/p AV nodal ablation  . Depressed ejection fraction 10    EF previously 45-50%, s/p AV nodal ablation and biv pacemaker implant, previously enrolled in Block HF and was randomized to RV pacing only)  . Chronic anemia     RECIEVES ARANESP SHOTS IF HIS HEMOGLOBIN FALLS BELOW 11  . Blood transfusion     "several; Dr. Eniver" (02/06/2013)  . GERD (gastroesophageal reflux disease)   . MDS (myelodysplastic syndrome), low grade 02/10/2011  . Iron overload, transfusional 02/10/2011  . Complete heart block 12/28/06    s/p AV nodal ablation and BIV pacemaker implant at [facility] by Dr Simmons  . CHF (congestive heart failure)   . Chronic bronchitis   . Pneumonia     "high school; [employer]; twice since, last time 12/ 2014"  (02/06/2013)  . H/O hiatal hernia   . Arthritis     "hands, back" (02/06/2013)  . Gout   . Kidney stones 1985    Past Surgical History Past Surgical History  Procedure Laterality Date  . Total knee arthroplasty Bilateral     right Dec.2011,left June 2012  . Joint replacement    . Pilonidal cyst excision  1970's  . Kidney stone surgery  1985    "tried to go up my penis; had to cut my stomach and go in for lodged stone"  . Av node ablation  12/08    at [facility]  . Insert / replace / remove pacemaker  12/28/2006    Medtronic Insync BiV pacemaker implanted at [facility] by Dr Simmons, enrolled in the Block HF study (now completed)  . Bunionectomy Bilateral 11/2012- 01/2013    "left-right; great toes; gout and bunion OR" (02/06/2013)  . Inguinal hernia repair Right   . Lithotripsy  1985  . Kidney stone surgery  1986    "put a tube in my back"  . Cataract extraction w/ intraocular lens  implant, bilateral Bilateral     Allergies/Intolerances Allergies  Allergen Reactions  . Moxifloxacin Hcl In Nacl Other (See Comments) and Itching    Pt c/o being "out of his head" & itching  . Methylprednisolone   . Ramipril     Renal function worsened on ACEi.      Current Home Medications Current Outpatient Prescriptions  Medication Sig Dispense Refill  . allopurinol (ZYLOPRIM) 100 MG tablet Take 100 mg by mouth 3 (three) times daily.       . carvedilol (COREG) 6.25 MG tablet TAKE ONE TABLET BY MOUTH TWICE DAILY  180 tablet  2  . famotidine (PEPCID) 20 MG tablet Take 20 mg by mouth 2 (two) times daily.        . finasteride (PROSCAR) 5 MG tablet Take 5 mg by mouth daily.        . furosemide (LASIX) 40 MG tablet TAKE ONE TABLET BY MOUTH EVERY DAY  90 tablet  1  . Multiple Vitamin (MULTI-VITAMIN PO) Take by mouth daily.        Marland Kitchen pyridoxine (B-6) 100 MG tablet Take 100 mg by mouth 2 (two) times daily.        . Rivaroxaban (XARELTO) 15 MG TABS tablet Take 1 tablet (15 mg total) by mouth  daily with supper.  30 tablet  11  . tamsulosin (FLOMAX) 0.4 MG CAPS capsule Take 0.4 mg by mouth daily.       No current facility-administered medications for this visit.    Social History History   Social History  . Marital Status: Married    Spouse Name: N/A    Number of Children: N/A  . Years of Education: N/A   Occupational History  . Not on file.   Social History Main Topics  . Smoking status: Former Smoker -- 1.00 packs/day for 25 years    Types: Cigarettes    Start date: 02/23/1944    Quit date: 02/10/1968  . Smokeless tobacco: Never Used     Comment: quit 44 years ago  . Alcohol Use: Yes     Comment: 02/06/2013 "drank a little bit from age [age]"  . Drug Use: No  . Sexual Activity: No   Other Topics Concern  . Not on file   Social History Narrative  . No narrative on file     Review of Systems General: No chills, fever, night sweats or weight changes Cardiovascular: No chest pain, dyspnea on exertion, edema, orthopnea, palpitations, paroxysmal nocturnal dyspnea Dermatological: No rash, lesions or masses Respiratory: No cough, dyspnea Urologic: No hematuria, dysuria Abdominal: No nausea, vomiting, diarrhea, bright red blood per rectum, melena, or hematemesis Neurologic: No visual changes, weakness, changes in mental status All other systems reviewed and are otherwise negative except as noted above.  Physical Exam Vitals: Blood pressure 100/50, pulse 70.  General: Well developed, well appearing 81 y.o. male in no acute distress. HEENT: Normocephalic, atraumatic. EOMs intact. Sclera nonicteric. Oropharynx clear.  Neck: Supple. No JVD. Lungs: Respirations regular and unlabored, CTA bilaterally. No wheezes, rales or rhonchi. Heart: RRR. S1, S2 present. No murmurs, rub, S3 or S4. Abdomen: Soft, non-distended.  Extremities: No clubbing, cyanosis or edema. PT/Radials 2+ and equal bilaterally. Psych: Normal affect. Neuro: Alert and oriented X 3. Moves all  extremities spontaneously.   Diagnostics Echocardiogram Jan 2015 Study Conclusions - Left ventricle: Septal dyskinesis. Inferobasal akinesis Anterior wall hpokinesis. The cavity size was moderately dilated. Wall thickness was normal. Systolic function was severely reduced. The estimated ejection fraction was in0 the range of 25% to 30%. - Left atrium: The atrium was mildly dilated. - Atrial septum: No defect or patent foramen ovale was identified. Device interrogation today - battery at ERI since 03/14/2013; V paced 97%; RV threshold 1V @ 0.5 msec; LV threshold 1V @ 0.8 msec  Assessment and Plan  1. CRT-P battery at ERI  2. Permanent atrial fibrillation s/p CRT-P implant and AV node ablation 3. NICM, EF 25-30% 4. Myelodysplasia with chronic anemia, followed by Dr. Ennever  Mr. Maher's CRT-P battery has reached ERI prematurely. I discussed need for BiV PPM generator change. Risks, benefits and alternatives to BiV PPM generator change were discussed in detail. These risks include, but are not limited to, bleeding and infection. Mr. Woldt expressed verbal understanding and agrees with this plan of care. Given that he is pacer dependent and his current device battery longevity is not reliable, I have recommended this be done today. I have spoken with Dr. Taylor via phone who agrees. [employer] have been notified. Of note, he does not report any CHF symptoms and per recent notes by both Dr. Brackbill and Dr. Allred there are no plans for further work-up regarding LVEF (please see notes from Jan and Feb 2015).  Signed, Levelle Edelen, PA-C 04/11/2013, 12:00 PM   

## 2013-04-11 NOTE — Addendum Note (Signed)
Addended by: Janan Halter F on: 04/11/2013 01:30 PM   Modules accepted: Orders

## 2013-04-11 NOTE — H&P (View-Only) (Signed)
ELECTROPHYSIOLOGY OFFICE NOTE   Patient ID: [name] MRN: [id number], DOB/AGE: 1927-04-13   Date of Visit: 04/11/2013  Primary Physician: Gwenlyn Perking, MD Primary Cardiologist: Mare Ferrari, MD Primary EP: Rayann Heman, MD Reason for Visit: CRT-P battery at [facility]  History of Present Illness  [name] is a 80 y.o. male with NICM, permanent AFib s/p CRT-P implant and AV node ablation (by Dr. Rosita Fire at [facility] in 2008) who presents today as an add on for electrophysiology followup. His BiV PPM battery has reached ERI prematurely. At his last visit with Dr. Rayann Heman 03/13/2013 his estimated longevity was 2.5 years. He presented to device clinic today because he "just didn't feel well" and his heart rate at home was 65 bpm which he knew was not normal for him as he is typically paced at 80 bpm. He denies CP or SOB. He denies palpitations, dizziness or syncope. He denies LE swelling, orthopnea or PND. On interrogation of his device his battery reached ERI on 03/14/2013.   Past Medical History Past Medical History  Diagnosis Date  . Bursitis     OF THE RIGHT SHOULDER  . Tinnitus   . Easy bruising     SOME FROM HIS COUMADIN  . Permanent atrial fibrillation     s/p AV nodal ablation  . Depressed ejection fraction 10    EF previously 45-50%, s/p AV nodal ablation and biv pacemaker implant, previously enrolled in Block HF and was randomized to RV pacing only)  . Chronic anemia     RECIEVES ARANESP SHOTS IF HIS HEMOGLOBIN FALLS BELOW 11  . Blood transfusion     "several; Dr. Deberah Pelton" (02/06/2013)  . GERD (gastroesophageal reflux disease)   . MDS (myelodysplastic syndrome), low grade 02/10/2011  . Iron overload, transfusional 02/10/2011  . Complete heart block 12/28/06    s/p AV nodal ablation and BIV pacemaker implant at [facility] by Dr Rosita Fire  . CHF (congestive heart failure)   . Chronic bronchitis   . Pneumonia     "high school; [employer]; twice since, last time 12/ 2014"  (02/06/2013)  . H/O hiatal hernia   . Arthritis     "hands, back" (02/06/2013)  . Gout   . Kidney stones 1985    Past Surgical History Past Surgical History  Procedure Laterality Date  . Total knee arthroplasty Bilateral     right Dec.2011,left June 2012  . Joint replacement    . Pilonidal cyst excision  1970's  . Kidney stone surgery  1985    "tried to go up my penis; had to cut my stomach and go in for lodged stone"  . Av node ablation  12/08    at [facility]  . Insert / replace / remove pacemaker  12/28/2006    Medtronic Insync BiV pacemaker implanted at [facility] by Dr Rosita Fire, enrolled in the Block HF study (now completed)  . Bunionectomy Bilateral 11/2012- 01/2013    "left-right; great toes; gout and bunion OR" (02/06/2013)  . Inguinal hernia repair Right   . Lithotripsy  1985  . Kidney stone surgery  1986    "put a tube in my back"  . Cataract extraction w/ intraocular lens  implant, bilateral Bilateral     Allergies/Intolerances Allergies  Allergen Reactions  . Moxifloxacin Hcl In Nacl Other (See Comments) and Itching    Pt c/o being "out of his head" & itching  . Methylprednisolone   . Ramipril     Renal function worsened on ACEi.  Current Home Medications Current Outpatient Prescriptions  Medication Sig Dispense Refill  . allopurinol (ZYLOPRIM) 100 MG tablet Take 100 mg by mouth 3 (three) times daily.       . carvedilol (COREG) 6.25 MG tablet TAKE ONE TABLET BY MOUTH TWICE DAILY  180 tablet  2  . famotidine (PEPCID) 20 MG tablet Take 20 mg by mouth 2 (two) times daily.        . finasteride (PROSCAR) 5 MG tablet Take 5 mg by mouth daily.        . furosemide (LASIX) 40 MG tablet TAKE ONE TABLET BY MOUTH EVERY DAY  90 tablet  1  . Multiple Vitamin (MULTI-VITAMIN PO) Take by mouth daily.        Marland Kitchen pyridoxine (B-6) 100 MG tablet Take 100 mg by mouth 2 (two) times daily.        . Rivaroxaban (XARELTO) 15 MG TABS tablet Take 1 tablet (15 mg total) by mouth  daily with supper.  30 tablet  11  . tamsulosin (FLOMAX) 0.4 MG CAPS capsule Take 0.4 mg by mouth daily.       No current facility-administered medications for this visit.    Social History History   Social History  . Marital Status: Married    Spouse Name: N/A    Number of Children: N/A  . Years of Education: N/A   Occupational History  . Not on file.   Social History Main Topics  . Smoking status: Former Smoker -- 1.00 packs/day for 25 years    Types: Cigarettes    Start date: 02/23/1944    Quit date: 02/10/1968  . Smokeless tobacco: Never Used     Comment: quit 44 years ago  . Alcohol Use: Yes     Comment: 02/06/2013 "drank a little bit from age [age]"  . Drug Use: No  . Sexual Activity: No   Other Topics Concern  . Not on file   Social History Narrative  . No narrative on file     Review of Systems General: No chills, fever, night sweats or weight changes Cardiovascular: No chest pain, dyspnea on exertion, edema, orthopnea, palpitations, paroxysmal nocturnal dyspnea Dermatological: No rash, lesions or masses Respiratory: No cough, dyspnea Urologic: No hematuria, dysuria Abdominal: No nausea, vomiting, diarrhea, bright red blood per rectum, melena, or hematemesis Neurologic: No visual changes, weakness, changes in mental status All other systems reviewed and are otherwise negative except as noted above.  Physical Exam Vitals: Blood pressure 100/50, pulse 70.  General: Well developed, well appearing 81 y.o. male in no acute distress. HEENT: Normocephalic, atraumatic. EOMs intact. Sclera nonicteric. Oropharynx clear.  Neck: Supple. No JVD. Lungs: Respirations regular and unlabored, CTA bilaterally. No wheezes, rales or rhonchi. Heart: RRR. S1, S2 present. No murmurs, rub, S3 or S4. Abdomen: Soft, non-distended.  Extremities: No clubbing, cyanosis or edema. PT/Radials 2+ and equal bilaterally. Psych: Normal affect. Neuro: Alert and oriented X 3. Moves all  extremities spontaneously.   Diagnostics Echocardiogram Jan 2015 Study Conclusions - Left ventricle: Septal dyskinesis. Inferobasal akinesis Anterior wall hpokinesis. The cavity size was moderately dilated. Wall thickness was normal. Systolic function was severely reduced. The estimated ejection fraction was in0 the range of 25% to 30%. - Left atrium: The atrium was mildly dilated. - Atrial septum: No defect or patent foramen ovale was identified. Device interrogation today - battery at ERI since 03/14/2013; V paced 97%; RV threshold 1V @ 0.5 msec; LV threshold 1V @ 0.8 msec  Assessment and Plan  1. CRT-P battery at [name]  2. Permanent atrial fibrillation s/p CRT-P implant and AV node ablation 3. NICM, EF 25-30% 4. Myelodysplasia with chronic anemia, followed by Dr. Marin Olp  Mr. Macon's CRT-P battery has reached ERI prematurely. I discussed need for BiV PPM generator change. Risks, benefits and alternatives to BiV PPM generator change were discussed in detail. These risks include, but are not limited to, bleeding and infection. Mr. Venard expressed verbal understanding and agrees with this plan of care. Given that he is pacer dependent and his current device battery longevity is not reliable, I have recommended this be done today. I have spoken with Dr. Lovena Le via phone who agrees. [employer] have been notified. Of note, he does not report any CHF symptoms and per recent notes by both Dr. Mare Ferrari and Dr. Rayann Heman there are no plans for further work-up regarding LVEF (please see notes from Jan and Feb 2015).  Signed, Ileene Hutchinson, PA-C 04/11/2013, 12:00 PM

## 2013-04-11 NOTE — Discharge Instructions (Signed)
Pacemaker Battery Change, Care After  °Refer to this sheet in the next few weeks. These instructions provide you with information on caring for yourself after your procedure. Your health care provider may also give you more specific instructions. Your treatment has been planned according to current medical practices, but problems sometimes occur. Call your health care provider if you have any problems or questions after your procedure. °WHAT TO EXPECT AFTER THE PROCEDURE °After your procedure, it is typical to have the following sensations: °· Soreness at the pacemaker site. °HOME CARE INSTRUCTIONS  °· Keep the incision clean and dry. °· Unless advised otherwise, you may shower beginning 48 hours after your procedure. °· For the first week after the replacement, avoid stretching motions that pull at the incision site and avoid heavy exercise with the arm on the same side as the incision. °· Only take over-the-counter or prescription medicines for pain, discomfort, or fever as directed by your health care provider. °· Your health care provider will tell you when you will need to next test your pacemaker by telephone or when to return to the office for follow up for removal of stitches. °SEEK MEDICAL CARE IF:  °· You have pain at the incision site that is not relieved by over-the-counter or prescription medicine. °· There is drainage or pus from the incision site. °· There is swelling larger than a lime at the incision site. °· You develop red streaking that extends above or below the incision site. °· You feel brief, intermittent palpitations, lightheadedness, or any symptoms that you feel might be related to your heart. °SEEK IMMEDIATE MEDICAL CARE IF:  °· You experience chest pain that is different than the pain at the pacemaker site. °· Shortness of breath. °· Palpitations or irregular heart beat. °· Lightheadedness that does not go away quickly. °· Fainting. °· You have pain that gets worse and is not relieved by  medicine. °MAKE SURE YOU:  °· Understand these instructions. °· Will watch your condition. °· Will get help right away if you are not doing well or get worse. °Document Released: 10/26/2012 Document Reviewed: 07/20/2012 °[employer]® Patient Information ©2014 [employer]. ° °

## 2013-04-11 NOTE — CV Procedure (Signed)
Electrophysiology procedure note  Procedure: Removal of previously implanted dual-chamber, biventricular pacemaker, and insertion of a new dual-chamber biventricular pacemaker  Indication: Complete heart block, status post biventricular pacemaker, with the old device at elective replacement  Findings: After informed consent was obtained, the patient was taken to the diagnostic electrophysiology laboratory in the fasting state. After the usual preparation and draping, 30 cc of lidocaine was infiltrated into the left infraclavicular region over the old pacemaker insertion site. A 5 cm incision was carried out over this region. Electrocautery was utilized to dissect down to the pacemaker pocket and the generator was removed with gentle traction. The leads were freed up other dense fibrous adhesions. The old Medtronic biventricular pacemaker was removed, and the new Medtronic biventricular pacemaker, Serial # [id number] was connected to the old right ventricular and left ventricular pacing leads and placed back in the subcutaneous pocket.  Prior to doing this, the right ventricular and left ventricular pacing leads were evaluated and found to be working satisfactorily. The pocket was irrigated with antibiotic irrigation. Electrocautery was utilized to assure hemostasis. The incision was closed with 2 layers of Vicryl suture. Benzoin and Steri-Strips were patent on the skin. A pressure dressing was applied. The patient was returned to the recovery area in satisfactory condition.  Complications: There were no immediate procedural complications  Conclusion: Successful removal of a previous implanted biventricular pacemaker and insertion of a new biventricular pacemaker without immediate procedural complication.  Cristopher Peru, M.D.

## 2013-04-11 NOTE — Interval H&P Note (Signed)
History and Physical Interval Note:  04/11/2013 4:44 PM  [name]  has presented today for surgery, with the diagnosis of bradicardia  The various methods of treatment have been discussed with the patient and family. After consideration of risks, benefits and other options for treatment, the patient has consented to  Procedure(s): PERMANENT PACEMAKER GENERATOR CHANGE (N/A) as a surgical intervention .  The patient's history has been reviewed, patient examined, no change in status, stable for surgery.  I have reviewed the patient's chart and labs.  Questions were answered to the patient's satisfaction.     Mikle Bosworth.D.

## 2013-04-20 ENCOUNTER — Other Ambulatory Visit: Payer: Medicare Other | Admitting: Lab

## 2013-04-20 ENCOUNTER — Ambulatory Visit: Payer: Medicare Other | Admitting: Hematology & Oncology

## 2013-04-20 ENCOUNTER — Ambulatory Visit (INDEPENDENT_AMBULATORY_CARE_PROVIDER_SITE_OTHER): Payer: Medicare Other | Admitting: *Deleted

## 2013-04-20 DIAGNOSIS — I4891 Unspecified atrial fibrillation: Secondary | ICD-10-CM

## 2013-04-20 DIAGNOSIS — I4821 Permanent atrial fibrillation: Secondary | ICD-10-CM

## 2013-04-20 DIAGNOSIS — I5022 Chronic systolic (congestive) heart failure: Secondary | ICD-10-CM

## 2013-04-20 DIAGNOSIS — I509 Heart failure, unspecified: Secondary | ICD-10-CM

## 2013-04-20 LAB — MDC_IDC_ENUM_SESS_TYPE_INCLINIC
Battery Voltage: 3.05 V
Brady Statistic AP VS Percent: 0 %
Brady Statistic RA Percent Paced: 0 %
Date Time Interrogation Session: 20150402151333
Lead Channel Impedance Value: 4047 Ohm
Lead Channel Impedance Value: 4047 Ohm
Lead Channel Impedance Value: 570 Ohm
Lead Channel Impedance Value: 589 Ohm
Lead Channel Impedance Value: 665 Ohm
Lead Channel Pacing Threshold Amplitude: 0.5 V
Lead Channel Pacing Threshold Amplitude: 1.5 V
Lead Channel Pacing Threshold Pulse Width: 0.4 ms
Lead Channel Pacing Threshold Pulse Width: 0.8 ms
Lead Channel Setting Pacing Amplitude: 2.5 V
Lead Channel Setting Pacing Amplitude: 2.5 V
Lead Channel Setting Pacing Pulse Width: 0.8 ms
MDC IDC MSMT LEADCHNL LV IMPEDANCE VALUE: 4047 Ohm
MDC IDC MSMT LEADCHNL LV IMPEDANCE VALUE: 4047 Ohm
MDC IDC MSMT LEADCHNL LV IMPEDANCE VALUE: 4047 Ohm
MDC IDC MSMT LEADCHNL RV IMPEDANCE VALUE: 646 Ohm
MDC IDC SET LEADCHNL RV PACING PULSEWIDTH: 0.4 ms
MDC IDC SET LEADCHNL RV SENSING SENSITIVITY: 5.6 mV
MDC IDC STAT BRADY AP VP PERCENT: 0 %
MDC IDC STAT BRADY AS VP PERCENT: 100 %
MDC IDC STAT BRADY AS VS PERCENT: 0 %
MDC IDC STAT BRADY RV PERCENT PACED: 100 %
Zone Setting Detection Interval: 350 ms
Zone Setting Detection Interval: 400 ms

## 2013-04-20 NOTE — Progress Notes (Signed)
Wound check appointment. Steri-strips removed. Wound without redness or edema. Incision edges approximated, wound well healed. Normal device function. Thresholds, sensing, and impedances consistent with implant measurements. Device programmed at 3.5V/auto capture programmed on for extra safety margin until 3 month visit. Histogram distribution appropriate for patient and level of activity. No high ventricular rates noted. Patient educated about wound care, arm mobility, lifting restrictions. ROV in 3 months with implanting physician. 

## 2013-05-02 ENCOUNTER — Ambulatory Visit (HOSPITAL_BASED_OUTPATIENT_CLINIC_OR_DEPARTMENT_OTHER): Payer: Medicare Other | Admitting: Hematology & Oncology

## 2013-05-02 ENCOUNTER — Other Ambulatory Visit (HOSPITAL_BASED_OUTPATIENT_CLINIC_OR_DEPARTMENT_OTHER): Payer: Medicare Other | Admitting: Lab

## 2013-05-02 ENCOUNTER — Ambulatory Visit: Payer: Medicare Other

## 2013-05-02 ENCOUNTER — Encounter: Payer: Self-pay | Admitting: Hematology & Oncology

## 2013-05-02 VITALS — BP 110/71 | HR 87 | Temp 97.7°F | Resp 18 | Ht 74.0 in | Wt 209.0 lb

## 2013-05-02 DIAGNOSIS — Z7901 Long term (current) use of anticoagulants: Secondary | ICD-10-CM

## 2013-05-02 DIAGNOSIS — I4891 Unspecified atrial fibrillation: Secondary | ICD-10-CM

## 2013-05-02 DIAGNOSIS — D4620 Refractory anemia with excess of blasts, unspecified: Secondary | ICD-10-CM

## 2013-05-02 LAB — CBC WITH DIFFERENTIAL (CANCER CENTER ONLY)
HCT: 34.4 % — ABNORMAL LOW (ref 38.7–49.9)
HGB: 11.1 g/dL — ABNORMAL LOW (ref 13.0–17.1)
MCH: 31.2 pg (ref 28.0–33.4)
MCHC: 32.3 g/dL (ref 32.0–35.9)
MCV: 97 fL (ref 82–98)
Platelets: 138 10*3/uL — ABNORMAL LOW (ref 145–400)
RBC: 3.56 10*6/uL — ABNORMAL LOW (ref 4.20–5.70)
RDW: 15.8 % — ABNORMAL HIGH (ref 11.1–15.7)
WBC: 5.5 10*3/uL (ref 4.0–10.0)

## 2013-05-02 LAB — CHCC SATELLITE - SMEAR

## 2013-05-02 LAB — IRON AND TIBC CHCC
%SAT: 73 % — ABNORMAL HIGH (ref 20–55)
Iron: 144 ug/dL (ref 42–163)
TIBC: 198 ug/dL — ABNORMAL LOW (ref 202–409)
UIBC: 54 ug/dL — ABNORMAL LOW (ref 117–376)

## 2013-05-02 LAB — MANUAL DIFFERENTIAL (CHCC SATELLITE)
ALC: 1.6 10*3/uL (ref 0.9–3.3)
ANC (CHCC MAN DIFF): 2.5 10*3/uL (ref 1.5–6.5)
BASO: 1 % (ref 0–2)
Band Neutrophils: 2 % (ref 0–10)
LYMPH: 30 % (ref 14–48)
MONO: 24 % — ABNORMAL HIGH (ref 0–13)
Myelocytes: 1 % — ABNORMAL HIGH (ref 0–0)
PLT EST ~~LOC~~: DECREASED
SEG: 42 % (ref 40–75)

## 2013-05-02 LAB — FERRITIN CHCC: Ferritin: 1264 ng/ml — ABNORMAL HIGH (ref 22–316)

## 2013-05-02 LAB — RETICULOCYTES (CHCC)
ABS RETIC: 18.1 10*3/uL — AB (ref 19.0–186.0)
RBC.: 3.61 MIL/uL — AB (ref 4.22–5.81)
Retic Ct Pct: 0.5 % (ref 0.4–2.3)

## 2013-05-02 NOTE — Progress Notes (Signed)
Aranesp injection held per Dr. Marin Olp. Hemoglobin 11.1 today.

## 2013-05-02 NOTE — Progress Notes (Signed)
Hematology and Oncology Follow Up Visit  [name] [id number] 06/12/1927 80 y.o. 05/02/2013   Principle Diagnosis:   Refractory anemia-a low-grade/low IPSS  Chronic atrial fibrillation  Current Therapy:    Aranesp 300 mcg subcutaneous for hemoglobin less than 10  Patient on Xarelto but wishes to go onto Coumadin for cost issues.     Interim History:  [name] is back for followup. We last saw him back in February. We did go ahead and give him a dose of Aranesp at that point in time. He had been previously hospitalized because of a TIA. He was more anemic.  He wants to get off Xarelto go back onto Coumadin. I have no problems with this. The Xarelto is too expensive for him. He was on 5 mg a day of Coumadin. I told to start the Coumadin today. He will stay on the Xarelto until his INR is over 2. We will check his INR on Friday. Prep otherwise, he is doing okay. He does have some neuropathy in his feet. Has occasional foot swelling. He's had no cough. He's had no nausea vomiting. There's been no change in bowel or bladder habits.  Medications: Current outpatient prescriptions:allopurinol (ZYLOPRIM) 100 MG tablet, Take 100 mg by mouth 2 (two) times daily. , Disp: , Rfl: ;  carvedilol (COREG) 6.25 MG tablet, Take 6.25 mg by mouth 2 (two) times daily with a meal., Disp: , Rfl: ;  famotidine (PEPCID) 20 MG tablet, Take 40 mg by mouth at bedtime. , Disp: , Rfl: ;  finasteride (PROSCAR) 5 MG tablet, Take 5 mg by mouth every evening. 5pm, Disp: , Rfl:  furosemide (LASIX) 40 MG tablet, Take 40 mg by mouth., Disp: , Rfl: ;  meloxicam (MOBIC) 7.5 MG tablet, Take 7.5 mg by mouth as needed. , Disp: , Rfl: ;  Multiple Vitamin (MULTIVITAMIN WITH MINERALS) TABS tablet, Take 1 tablet by mouth daily., Disp: , Rfl: ;  pyridoxine (B-6) 100 MG tablet, Take 100 mg by mouth 2 (two) times daily. , Disp: , Rfl:  Rivaroxaban (XARELTO) 15 MG TABS tablet, Take 1 tablet (15 mg total) by mouth daily with supper.,  Disp: 30 tablet, Rfl: 11;  tamsulosin (FLOMAX) 0.4 MG CAPS capsule, Take 0.4 mg by mouth every other day. 5pm, Disp: , Rfl:   Allergies:  Allergies  Allergen Reactions  . Moxifloxacin Hcl In Nacl Other (See Comments) and Itching    Pt c/o being "out of his head" & itching  . Methylprednisolone Other (See Comments)    Dizziness, passed out twice  . Ramipril Other (See Comments)    Renal function worsened on ACEi.    Past Medical History, Surgical history, Social history, and Family History were reviewed and updated.  Review of Systems: As above  Physical Exam:  height is 6\' 2"  (1.88 m) and weight is 209 lb (94.802 kg). His oral temperature is 97.7 F (36.5 C). His blood pressure is 110/71 and his pulse is 87. His respiration is 18.   Well-developed well-nourished gentleman. Head and exam shows no ocular or oral lesions. There is no sclera icterus. Conjunctiva are pink. Neck is supple with no adenopathy. Lungs are clear. Cardiac exam regular in rhythm. He has a 1/6 systolic ejection murmur. Abdomen is soft. There is no palpable liver or spleen tip. There is no fluid wave. There is no guarding or rebound tenderness. Neck exam no tenderness over the spine ribs or hips. Extremities shows some trace edema in his lower legs.  His direct was Ms. Blanch Media. Has good strength. Skin exam no rashes, ecchymosis or petechia. Neurological exam is nonfocal.  Lab Results  Component Value Date   WBC 5.5 05/02/2013   HGB 11.1* 05/02/2013   HCT 34.4* 05/02/2013   MCV 97 05/02/2013   PLT 138* 05/02/2013     Chemistry      Component Value Date/Time   NA 137 04/11/2013 1035   K 3.8 04/11/2013 1035   CL 103 04/11/2013 1035   CO2 29 04/11/2013 1035   BUN 31* 04/11/2013 1035   CREATININE 1.8* 04/11/2013 1035      Component Value Date/Time   CALCIUM 9.6 04/11/2013 1035   ALKPHOS 109 02/22/2013 1337   AST 39* 02/22/2013 1337   ALT 38 02/22/2013 1337   BILITOT 0.8 02/22/2013 1337         Impression and Plan: [name] is 80 year old gentleman with refractory anemia. Briefly, this really has not been a problem for [place]. We are watching his iron studies. When we last checked his iron studies back in March, his ferritin was 1475 with an iron saturation of 43%.  We will go ahead and give him Coumadin. He has some at home already. He had been on 5 mg daily dose which was at work working quite well for him.  He does not need any Aranesp today.  We will monitor his Coumadin now.  I will plan to see him back myself in another 6 weeks.  I spent a good 25 minutes or so with him talking to him about switching to Coumadin from Xarelto. He understands how we need to do this.   [name], MD 4/14/20159:49 AM

## 2013-05-05 ENCOUNTER — Other Ambulatory Visit (HOSPITAL_BASED_OUTPATIENT_CLINIC_OR_DEPARTMENT_OTHER): Payer: Medicare Other | Admitting: Lab

## 2013-05-05 DIAGNOSIS — D4620 Refractory anemia with excess of blasts, unspecified: Secondary | ICD-10-CM

## 2013-05-05 DIAGNOSIS — I4891 Unspecified atrial fibrillation: Secondary | ICD-10-CM

## 2013-05-05 LAB — PROTIME-INR (CHCC SATELLITE)
INR: 1.8 — AB (ref 2.0–3.5)
Protime: 21.6 Seconds — ABNORMAL HIGH (ref 10.6–13.4)

## 2013-05-08 ENCOUNTER — Telehealth: Payer: Self-pay

## 2013-05-08 ENCOUNTER — Other Ambulatory Visit: Payer: Self-pay | Admitting: Cardiology

## 2013-05-08 NOTE — Telephone Encounter (Addendum)
Message copied by Johny Drilling on Mon May 08, 2013  1:29 PM ------      Message from: Burney Gauze R      Created: Mon May 08, 2013  6:40 AM       Call - INR is coming up nicely!!  NO change in coumadin dose!!!  Need to repeat INR in 10 days!!  Laurey Arrow ------ Pt aware of above note & verbalizes understanding. dph

## 2013-05-10 ENCOUNTER — Encounter: Payer: Self-pay | Admitting: Internal Medicine

## 2013-05-12 ENCOUNTER — Other Ambulatory Visit (HOSPITAL_BASED_OUTPATIENT_CLINIC_OR_DEPARTMENT_OTHER): Payer: Medicare Other | Admitting: Lab

## 2013-05-12 ENCOUNTER — Other Ambulatory Visit: Payer: Self-pay | Admitting: Nurse Practitioner

## 2013-05-12 ENCOUNTER — Encounter: Payer: Self-pay | Admitting: Nurse Practitioner

## 2013-05-12 DIAGNOSIS — I4891 Unspecified atrial fibrillation: Secondary | ICD-10-CM

## 2013-05-12 DIAGNOSIS — D4620 Refractory anemia with excess of blasts, unspecified: Secondary | ICD-10-CM

## 2013-05-12 LAB — PROTHROMBIN TIME
INR: 8.6 (ref <1.50)
PROTHROMBIN TIME: 67.2 s — AB (ref 11.6–15.2)

## 2013-05-12 LAB — PROTIME-INR (CHCC SATELLITE)

## 2013-05-12 NOTE — Progress Notes (Signed)
Spoke to pt regarding his Pt/inr results being elevated. Pt was advised to stop taking Xarelto, hold his coumadin for the next 2 days, and he will come in on Monday for a repeat PT/INR. Pt verbalized understanding and appreciation.

## 2013-05-15 ENCOUNTER — Other Ambulatory Visit: Payer: Self-pay

## 2013-05-15 ENCOUNTER — Other Ambulatory Visit (HOSPITAL_BASED_OUTPATIENT_CLINIC_OR_DEPARTMENT_OTHER): Payer: Medicare Other | Admitting: Lab

## 2013-05-15 ENCOUNTER — Telehealth: Payer: Self-pay

## 2013-05-15 DIAGNOSIS — I4891 Unspecified atrial fibrillation: Secondary | ICD-10-CM

## 2013-05-15 DIAGNOSIS — D469 Myelodysplastic syndrome, unspecified: Secondary | ICD-10-CM

## 2013-05-15 DIAGNOSIS — D4620 Refractory anemia with excess of blasts, unspecified: Secondary | ICD-10-CM

## 2013-05-15 LAB — CBC WITH DIFFERENTIAL (CANCER CENTER ONLY)
HCT: 33.7 % — ABNORMAL LOW (ref 38.7–49.9)
HEMOGLOBIN: 11 g/dL — AB (ref 13.0–17.1)
MCH: 31.4 pg (ref 28.0–33.4)
MCHC: 32.6 g/dL (ref 32.0–35.9)
MCV: 96 fL (ref 82–98)
Platelets: 135 10*3/uL — ABNORMAL LOW (ref 145–400)
RBC: 3.5 10*6/uL — ABNORMAL LOW (ref 4.20–5.70)
RDW: 15.4 % (ref 11.1–15.7)
WBC: 5.9 10*3/uL (ref 4.0–10.0)

## 2013-05-15 LAB — MANUAL DIFFERENTIAL (CHCC SATELLITE)
ALC: 2.1 10*3/uL (ref 0.9–3.3)
ANC (CHCC MAN DIFF): 2.5 10*3/uL (ref 1.5–6.5)
BASO: 1 % (ref 0–2)
Eos: 1 % (ref 0–7)
LYMPH: 36 % (ref 14–48)
MONO: 19 % — ABNORMAL HIGH (ref 0–13)
PLATELET MORPHOLOGY: NORMAL
PLT EST ~~LOC~~: DECREASED
SEG: 43 % (ref 40–75)

## 2013-05-15 LAB — PROTIME-INR (CHCC SATELLITE)
INR: 1.7 — AB (ref 2.0–3.5)
PROTIME: 20.4 s — AB (ref 10.6–13.4)

## 2013-05-15 MED ORDER — WARFARIN SODIUM 5 MG PO TABS: 5.0000 mg | ORAL_TABLET | Freq: Every day | ORAL | Status: DC

## 2013-05-15 NOTE — Telephone Encounter (Signed)
Notified pt by phone to begin taking Coumadin 5mg  daily & recheck INR on Thursday April 30.   Pt verbalizes understanding and requests script be send to [facility] on [address]. dph

## 2013-05-17 ENCOUNTER — Other Ambulatory Visit: Payer: Self-pay | Admitting: *Deleted

## 2013-05-17 DIAGNOSIS — D4620 Refractory anemia with excess of blasts, unspecified: Secondary | ICD-10-CM

## 2013-05-18 ENCOUNTER — Ambulatory Visit (HOSPITAL_BASED_OUTPATIENT_CLINIC_OR_DEPARTMENT_OTHER): Payer: Medicare Other | Admitting: Lab

## 2013-05-18 DIAGNOSIS — I4891 Unspecified atrial fibrillation: Secondary | ICD-10-CM

## 2013-05-18 DIAGNOSIS — D4620 Refractory anemia with excess of blasts, unspecified: Secondary | ICD-10-CM

## 2013-05-18 LAB — MANUAL DIFFERENTIAL (CHCC SATELLITE)
ALC: 1.6 10*3/uL (ref 0.9–3.3)
ANC (CHCC HP manual diff): 1.6 10*3/uL (ref 1.5–6.5)
BASO: 1 % (ref 0–2)
Eos: 1 % (ref 0–7)
LYMPH: 38 % (ref 14–48)
MONO: 22 % — ABNORMAL HIGH (ref 0–13)
PLT EST ~~LOC~~: DECREASED
Platelet Morphology: NORMAL
SEG: 38 % — AB (ref 40–75)

## 2013-05-18 LAB — PROTIME-INR (CHCC SATELLITE)
INR: 1.9 — ABNORMAL LOW (ref 2.0–3.5)
Protime: 22.8 Seconds — ABNORMAL HIGH (ref 10.6–13.4)

## 2013-05-18 LAB — CBC WITH DIFFERENTIAL (CANCER CENTER ONLY)
HEMATOCRIT: 30.8 % — AB (ref 38.7–49.9)
HGB: 9.9 g/dL — ABNORMAL LOW (ref 13.0–17.1)
MCH: 31.1 pg (ref 28.0–33.4)
MCHC: 32.1 g/dL (ref 32.0–35.9)
MCV: 97 fL (ref 82–98)
Platelets: 121 10*3/uL — ABNORMAL LOW (ref 145–400)
RBC: 3.18 10*6/uL — ABNORMAL LOW (ref 4.20–5.70)
RDW: 15.3 % (ref 11.1–15.7)
WBC: 4.2 10*3/uL (ref 4.0–10.0)

## 2013-06-13 ENCOUNTER — Encounter: Payer: Self-pay | Admitting: Hematology & Oncology

## 2013-06-13 ENCOUNTER — Other Ambulatory Visit (HOSPITAL_BASED_OUTPATIENT_CLINIC_OR_DEPARTMENT_OTHER): Payer: Medicare Other | Admitting: Lab

## 2013-06-13 ENCOUNTER — Ambulatory Visit (HOSPITAL_BASED_OUTPATIENT_CLINIC_OR_DEPARTMENT_OTHER): Payer: Medicare Other | Admitting: Hematology & Oncology

## 2013-06-13 VITALS — BP 106/56 | HR 86 | Temp 97.9°F | Resp 18 | Ht 74.0 in | Wt 209.0 lb

## 2013-06-13 DIAGNOSIS — I4891 Unspecified atrial fibrillation: Secondary | ICD-10-CM

## 2013-06-13 DIAGNOSIS — D4620 Refractory anemia with excess of blasts, unspecified: Secondary | ICD-10-CM

## 2013-06-13 DIAGNOSIS — Z7901 Long term (current) use of anticoagulants: Secondary | ICD-10-CM

## 2013-06-13 LAB — CBC WITH DIFFERENTIAL (CANCER CENTER ONLY)
HEMATOCRIT: 29.4 % — AB (ref 38.7–49.9)
HGB: 9.7 g/dL — ABNORMAL LOW (ref 13.0–17.1)
MCH: 32.1 pg (ref 28.0–33.4)
MCHC: 33 g/dL (ref 32.0–35.9)
MCV: 97 fL (ref 82–98)
Platelets: 122 10*3/uL — ABNORMAL LOW (ref 145–400)
RBC: 3.02 10*6/uL — ABNORMAL LOW (ref 4.20–5.70)
RDW: 14.7 % (ref 11.1–15.7)
WBC: 6 10*3/uL (ref 4.0–10.0)

## 2013-06-13 LAB — RETICULOCYTES (CHCC)
ABS Retic: 12.3 10*3/uL — ABNORMAL LOW (ref 19.0–186.0)
RBC.: 3.07 MIL/uL — AB (ref 4.22–5.81)
Retic Ct Pct: 0.4 % (ref 0.4–2.3)

## 2013-06-13 LAB — MANUAL DIFFERENTIAL (CHCC SATELLITE)
ALC: 1.4 10*3/uL (ref 0.9–3.3)
ANC (CHCC MAN DIFF): 3 10*3/uL (ref 1.5–6.5)
Band Neutrophils: 2 % (ref 0–10)
Eos: 1 % (ref 0–7)
LYMPH: 23 % (ref 14–48)
MONO: 26 % — ABNORMAL HIGH (ref 0–13)
PLT EST ~~LOC~~: DECREASED
SEG: 47 % (ref 40–75)

## 2013-06-13 LAB — IRON AND TIBC CHCC
%SAT: 32 % (ref 20–55)
Iron: 55 ug/dL (ref 42–163)
TIBC: 171 ug/dL — AB (ref 202–409)
UIBC: 116 ug/dL — AB (ref 117–376)

## 2013-06-13 LAB — PROTIME-INR (CHCC SATELLITE)
INR: 3.3 (ref 2.0–3.5)
Protime: 39.6 Seconds — ABNORMAL HIGH (ref 10.6–13.4)

## 2013-06-13 LAB — CHCC SATELLITE - SMEAR

## 2013-06-13 LAB — FERRITIN CHCC

## 2013-06-13 NOTE — Progress Notes (Deleted)
Hematology and Oncology Follow Up Visit  [name] [id number] 22-May-1927 80 y.o. 06/13/2013   Principle Diagnosis:   ***  Current Therapy:    ***     Interim History:  [name] is ***  Medications: Current outpatient prescriptions:allopurinol (ZYLOPRIM) 100 MG tablet, Take 100 mg by mouth 2 (two) times daily. , Disp: , Rfl: ;  carvedilol (COREG) 6.25 MG tablet, Take 6.25 mg by mouth 2 (two) times daily with a meal., Disp: , Rfl: ;  famotidine (PEPCID) 20 MG tablet, Take 40 mg by mouth at bedtime. , Disp: , Rfl: ;  finasteride (PROSCAR) 5 MG tablet, Take 5 mg by mouth every evening. 5pm, Disp: , Rfl:  furosemide (LASIX) 40 MG tablet, TAKE ONE TABLET BY MOUTH ONCE DAILY, Disp: 90 tablet, Rfl: 0;  meloxicam (MOBIC) 7.5 MG tablet, Take 7.5 mg by mouth as needed. , Disp: , Rfl: ;  Multiple Vitamin (MULTIVITAMIN WITH MINERALS) TABS tablet, Take 1 tablet by mouth daily., Disp: , Rfl: ;  pyridoxine (B-6) 100 MG tablet, Take 100 mg by mouth 2 (two) times daily. , Disp: , Rfl:  tamsulosin (FLOMAX) 0.4 MG CAPS capsule, Take 0.4 mg by mouth every other day. 5pm, Disp: , Rfl: ;  warfarin (COUMADIN) 5 MG tablet, Take 1 tablet (5 mg total) by mouth daily. 1 tablet po daily or as directed by [name]., Disp: 90 tablet, Rfl: 0  Allergies:  Allergies  Allergen Reactions  . Moxifloxacin Hcl In Nacl Other (See Comments) and Itching    Pt c/o being "out of his head" & itching  . Methylprednisolone Other (See Comments)    Dizziness, passed out twice  . Ramipril Other (See Comments)    Renal function worsened on ACEi.    Past Medical History, Surgical history, Social history, and Family History were reviewed and updated.  Review of Systems: ***  Physical Exam:  height is 6\' 2"  (1.88 m) and weight is 209 lb (94.802 kg). His oral temperature is 97.9 F (36.6 C). His blood pressure is 106/56 and his pulse is 86. His respiration is 18.   ***  Lab Results  Component Value Date   WBC 4.2 05/18/2013    HGB 9.9* 05/18/2013   HCT 30.8* 05/18/2013   MCV 97 05/18/2013   PLT 121* 05/18/2013     Chemistry      Component Value Date/Time   NA 137 04/11/2013 1035   K 3.8 04/11/2013 1035   CL 103 04/11/2013 1035   CO2 29 04/11/2013 1035   BUN 31* 04/11/2013 1035   CREATININE 1.8* 04/11/2013 1035      Component Value Date/Time   CALCIUM 9.6 04/11/2013 1035   ALKPHOS 109 02/22/2013 1337   AST 39* 02/22/2013 1337   ALT 38 02/22/2013 1337   BILITOT 0.8 02/22/2013 1337         Impression and Plan: [name] is see below  [name], [name] 5/26/20159:48 AM Hematology and Oncology Follow Up Visit  [name] [id number] January 05, 1928 80 y.o. 06/15/2013   Principle Diagnosis:   Refractory anemia-a low-grade/low IPSS  Chronic atrial fibrillation  Current Therapy:    Aranesp 300 mcg subcutaneous for hemoglobin less than 10  Coumadin 5 mg by mouth daily.     Interim History:  Mr.  Marks is back for followup. We last saw him back in February. We did go ahead and give him a dose of Aranesp at that point in time. He had been previously hospitalized  because of a TIA. He was more anemic.   otherwise, he is doing okay. He does have some neuropathy in his feet. Has occasional foot swelling. He's had no cough. He's had no nausea vomiting. There's been no change in bowel or bladder habits.  His birthday is today. He and his wife will be going out for lunch. He is looking forward to this.  He's had no bleeding. There's been no bruising. He's had no rashes. There's no headache.  I don't have his lab her back yet today. This is because of a  mechanical "glitch".  Medications: Current outpatient prescriptions:allopurinol (ZYLOPRIM) 100 MG tablet, Take 100 mg by mouth 2 (two) times daily. , Disp: , Rfl: ;  carvedilol (COREG) 6.25 MG tablet, Take 6.25 mg by mouth 2 (two) times daily with a meal., Disp: , Rfl: ;  famotidine (PEPCID) 20 MG tablet, Take 40 mg by mouth at bedtime. , Disp: , Rfl: ;   finasteride (PROSCAR) 5 MG tablet, Take 5 mg by mouth every evening. 5pm, Disp: , Rfl:  furosemide (LASIX) 40 MG tablet, TAKE ONE TABLET BY MOUTH ONCE DAILY, Disp: 90 tablet, Rfl: 0;  meloxicam (MOBIC) 7.5 MG tablet, Take 7.5 mg by mouth as needed. , Disp: , Rfl: ;  Multiple Vitamin (MULTIVITAMIN WITH MINERALS) TABS tablet, Take 1 tablet by mouth daily., Disp: , Rfl: ;  pyridoxine (B-6) 100 MG tablet, Take 100 mg by mouth 2 (two) times daily. , Disp: , Rfl:  tamsulosin (FLOMAX) 0.4 MG CAPS capsule, Take 0.4 mg by mouth every other day. 5pm, Disp: , Rfl: ;  warfarin (COUMADIN) 5 MG tablet, Take 1 tablet (5 mg total) by mouth daily. 1 tablet po daily or as directed by [name]., Disp: 90 tablet, Rfl: 0  Allergies:  Allergies  Allergen Reactions  . Moxifloxacin Hcl In Nacl Other (See Comments) and Itching    Pt c/o being "out of his head" & itching  . Methylprednisolone Other (See Comments)    Dizziness, passed out twice  . Ramipril Other (See Comments)    Renal function worsened on ACEi.    Past Medical History, Surgical history, Social history, and Family History were reviewed and updated.  Review of Systems: As above  Physical Exam:  height is 6\' 2"  (1.88 m) and weight is 209 lb (94.802 kg). His oral temperature is 97.9 F (36.6 C). His blood pressure is 106/56 and his pulse is 86. His respiration is 18.   Well-developed well-nourished gentleman. Head and exam shows no ocular or oral lesions. There is no sclera icterus. Conjunctiva are pink. Neck is supple with no adenopathy. Lungs are clear. Cardiac exam regular in rhythm. He has a 1/6 systolic ejection murmur. Abdomen is soft. There is no palpable liver or spleen tip. There is no fluid wave. There is no guarding or rebound tenderness. Neck exam no tenderness over the spine ribs or hips. Extremities shows some trace edema in his lower legs. His direct was [name]. Has good strength. Skin exam no rashes, ecchymosis or petechia. Neurological exam  is nonfocal.  Lab Results  Component Value Date   WBC 6.0 06/13/2013   HGB 9.7* 06/13/2013   HCT 29.4* 06/13/2013   MCV 97 06/13/2013   PLT 122* 06/13/2013     Chemistry      Component Value Date/Time   NA 137 04/11/2013 1035   K 3.8 04/11/2013 1035   CL 103 04/11/2013 1035   CO2 29 04/11/2013 1035   BUN 31*  04/11/2013 1035   CREATININE 1.8* 04/11/2013 1035      Component Value Date/Time   CALCIUM 9.6 04/11/2013 1035   ALKPHOS 109 02/22/2013 1337   AST 39* 02/22/2013 1337   ALT 38 02/22/2013 1337   BILITOT 0.8 02/22/2013 1337         Impression and Plan: Mr. Caffrey is 80 year old gentleman with refractory anemia. Briefly, this really has not been a problem for [place]. We are watching his iron studies. When we last checked his iron studies this visit his ferritin was 1558 with an iron saturation of 32 %.  We will go ahead and monitor his Coumadin his INR is 3.3.  He does not need any Aranesp today. He though his hemoglobin is low, he is a symptomatic.  We will monitor his Coumadin now.  I will plan to see him back myself in another 6 weeks.   [name], [name] 5/28/20156:16 AM

## 2013-06-16 NOTE — Progress Notes (Signed)
Hematology and Oncology Follow Up Visit  [name] [id number] 06-01-1927 80 y.o. 06/16/2013   Principle Diagnosis:   Refractory anemia-a low-grade/low IPSS  Chronic atrial fibrillation  Current Therapy:    Aranesp 300 mcg subcutaneous for hemoglobin less than 10  Coumadin to maintain INR of 2.     Interim History:  Mr.  Marks is back for followup. We last saw him back in February. We did go ahead and give him a dose of Aranesp at that point in time. He had been previously hospitalized because of a TIA. He was more anemic.  He now is on Coumadin. He is okay with this. This is much more affordable for him. He's had no problems bleeding. He's had no cough. No shortness of breath. His abdomen bowel or bladder habits.  He does have arthritis. He does take Mobic. The mother does help him. I do not have a problems with him taking Mobic while on Coumadin. He's done this for years without any problems.    Medications: Current outpatient prescriptions:allopurinol (ZYLOPRIM) 100 MG tablet, Take 100 mg by mouth 2 (two) times daily. , Disp: , Rfl: ;  carvedilol (COREG) 6.25 MG tablet, Take 6.25 mg by mouth 2 (two) times daily with a meal., Disp: , Rfl: ;  famotidine (PEPCID) 20 MG tablet, Take 40 mg by mouth at bedtime. , Disp: , Rfl: ;  finasteride (PROSCAR) 5 MG tablet, Take 5 mg by mouth every evening. 5pm, Disp: , Rfl:  furosemide (LASIX) 40 MG tablet, TAKE ONE TABLET BY MOUTH ONCE DAILY, Disp: 90 tablet, Rfl: 0;  meloxicam (MOBIC) 7.5 MG tablet, Take 7.5 mg by mouth as needed. , Disp: , Rfl: ;  Multiple Vitamin (MULTIVITAMIN WITH MINERALS) TABS tablet, Take 1 tablet by mouth daily., Disp: , Rfl: ;  pyridoxine (B-6) 100 MG tablet, Take 100 mg by mouth 2 (two) times daily. , Disp: , Rfl:  tamsulosin (FLOMAX) 0.4 MG CAPS capsule, Take 0.4 mg by mouth every other day. 5pm, Disp: , Rfl: ;  warfarin (COUMADIN) 5 MG tablet, Take 1 tablet (5 mg total) by mouth daily. 1 tablet po daily or as directed  by MD., Disp: 90 tablet, Rfl: 0  Allergies:  Allergies  Allergen Reactions  . Moxifloxacin Hcl In Nacl Other (See Comments) and Itching    Pt c/o being "out of his head" & itching  . Methylprednisolone Other (See Comments)    Dizziness, passed out twice  . Ramipril Other (See Comments)    Renal function worsened on ACEi.    Past Medical History, Surgical history, Social history, and Family History were reviewed and updated.  Review of Systems: As above  Physical Exam:  height is 6\' 2"  (1.88 m) and weight is 209 lb (94.802 kg). His oral temperature is 97.9 F (36.6 C). His blood pressure is 106/56 and his pulse is 86. His respiration is 18.   Well-developed well-nourished gentleman. Head and exam shows no ocular or oral lesions. There is no sclera icterus. Conjunctiva are pink. Neck is supple with no adenopathy. Lungs are clear. Cardiac exam regular in rhythm. He has a 1/6 systolic ejection murmur. Abdomen is soft. There is no palpable liver or spleen tip. There is no fluid wave. There is no guarding or rebound tenderness. Neck exam no tenderness over the spine ribs or hips. Extremities shows some trace edema in his lower legs. His direct was Ms. Blanch Media. Has good strength. Skin exam no rashes, ecchymosis or petechia. Neurological exam is  nonfocal.  Lab Results  Component Value Date   WBC 6.0 06/13/2013   HGB 9.7* 06/13/2013   HCT 29.4* 06/13/2013   MCV 97 06/13/2013   PLT 122* 06/13/2013     Chemistry      Component Value Date/Time   NA 137 04/11/2013 1035   K 3.8 04/11/2013 1035   CL 103 04/11/2013 1035   CO2 29 04/11/2013 1035   BUN 31* 04/11/2013 1035   CREATININE 1.8* 04/11/2013 1035      Component Value Date/Time   CALCIUM 9.6 04/11/2013 1035   ALKPHOS 109 02/22/2013 1337   AST 39* 02/22/2013 1337   ALT 38 02/22/2013 1337   BILITOT 0.8 02/22/2013 1337         Impression and Plan: [name] is 80 year old gentleman with refractory anemia. Briefly, this really has not been a  problem for [place]. We are watching his iron studies.  recheck his iron studies. His ferritin is was 1558. His iron saturation was 32%.  His INR is 3.3   He does not need any Aranesp today.  We will monitor his Coumadin now.  I will plan to see him back myself in another 6 weeks.    [name], MD 5/29/20155:09 PM

## 2013-07-10 ENCOUNTER — Other Ambulatory Visit: Payer: Self-pay | Admitting: Cardiology

## 2013-07-11 ENCOUNTER — Other Ambulatory Visit: Payer: Self-pay | Admitting: Cardiology

## 2013-07-14 ENCOUNTER — Encounter: Payer: Medicare Other | Admitting: Internal Medicine

## 2013-07-25 ENCOUNTER — Ambulatory Visit (HOSPITAL_BASED_OUTPATIENT_CLINIC_OR_DEPARTMENT_OTHER): Payer: Medicare Other | Admitting: Hematology & Oncology

## 2013-07-25 ENCOUNTER — Encounter: Payer: Self-pay | Admitting: Hematology & Oncology

## 2013-07-25 ENCOUNTER — Other Ambulatory Visit (HOSPITAL_BASED_OUTPATIENT_CLINIC_OR_DEPARTMENT_OTHER): Payer: Medicare Other | Admitting: Lab

## 2013-07-25 VITALS — BP 123/80 | HR 90 | Temp 97.7°F | Resp 12 | Wt 210.0 lb

## 2013-07-25 DIAGNOSIS — I4891 Unspecified atrial fibrillation: Secondary | ICD-10-CM

## 2013-07-25 DIAGNOSIS — D4620 Refractory anemia with excess of blasts, unspecified: Secondary | ICD-10-CM

## 2013-07-25 LAB — CBC WITH DIFFERENTIAL (CANCER CENTER ONLY)
HEMATOCRIT: 32.6 % — AB (ref 38.7–49.9)
HEMOGLOBIN: 10.8 g/dL — AB (ref 13.0–17.1)
MCH: 32.3 pg (ref 28.0–33.4)
MCHC: 33.1 g/dL (ref 32.0–35.9)
MCV: 98 fL (ref 82–98)
Platelets: 179 10*3/uL (ref 145–400)
RBC: 3.34 10*6/uL — AB (ref 4.20–5.70)
RDW: 14.5 % (ref 11.1–15.7)
WBC: 8.8 10*3/uL (ref 4.0–10.0)

## 2013-07-25 LAB — PROTIME-INR (CHCC SATELLITE)
INR: 2.6 (ref 2.0–3.5)
PROTIME: 31.2 s — AB (ref 10.6–13.4)

## 2013-07-25 LAB — IRON AND TIBC CHCC
%SAT: 70 % — AB (ref 20–55)
IRON: 134 ug/dL (ref 42–163)
TIBC: 190 ug/dL — ABNORMAL LOW (ref 202–409)
UIBC: 56 ug/dL — ABNORMAL LOW (ref 117–376)

## 2013-07-25 LAB — MANUAL DIFFERENTIAL (CHCC SATELLITE)
ALC: 0.9 10*3/uL (ref 0.9–3.3)
ANC (CHCC MAN DIFF): 7.2 10*3/uL — AB (ref 1.5–6.5)
LYMPH: 10 % — ABNORMAL LOW (ref 14–48)
MONO: 8 % (ref 0–13)
PLT EST ~~LOC~~: ADEQUATE
Platelet Morphology: NORMAL
SEG: 82 % — AB (ref 40–75)

## 2013-07-25 LAB — RETICULOCYTES (CHCC)
ABS Retic: 30.5 10*3/uL (ref 19.0–186.0)
RBC.: 3.39 MIL/uL — ABNORMAL LOW (ref 4.22–5.81)
Retic Ct Pct: 0.9 % (ref 0.4–2.3)

## 2013-07-25 LAB — FERRITIN CHCC: Ferritin: 1279 ng/ml — ABNORMAL HIGH (ref 22–316)

## 2013-07-25 LAB — CHCC SATELLITE - SMEAR

## 2013-07-25 NOTE — Progress Notes (Signed)
Hematology and Oncology Follow Up Visit  [name] [id number] 08-24-1927 80 y.o. 07/25/2013   Principle Diagnosis:   Refractory anemia-a low-grade/low IPSS  Chronic atrial fibrillation  Current Therapy:    Lifelong Coumadin  Aranesp 300 mcg subcutaneous for hemoglobin less than 10.     Interim History:  Mr.  Marks is back for followup. We last saw him back in May. He's been doing fairly well. We'll we last saw him, he had his birthday.  He's had no bleeding. He still has a problem with gout in his feet.  He's had no nausea vomiting. There's been no cough. Has been no shortness of breath. He's had no fevers sweats or chills.  He does have a garden that he's been working in. He enjoys this.  Medications: Current outpatient prescriptions:allopurinol (ZYLOPRIM) 100 MG tablet, Take 100 mg by mouth 2 (two) times daily. , Disp: , Rfl: ;  carvedilol (COREG) 6.25 MG tablet, TAKE ONE TABLET BY MOUTH TWICE DAILY, Disp: 180 tablet, Rfl: 0;  famotidine (PEPCID) 20 MG tablet, Take 40 mg by mouth at bedtime. , Disp: , Rfl: ;  finasteride (PROSCAR) 5 MG tablet, Take 5 mg by mouth every evening. 5pm, Disp: , Rfl:  furosemide (LASIX) 40 MG tablet, TAKE ONE TABLET BY MOUTH ONCE DAILY, Disp: 90 tablet, Rfl: 0;  meloxicam (MOBIC) 7.5 MG tablet, Take 7.5 mg by mouth as needed. , Disp: , Rfl: ;  Multiple Vitamin (MULTIVITAMIN WITH MINERALS) TABS tablet, Take 1 tablet by mouth daily., Disp: , Rfl: ;  pyridoxine (B-6) 100 MG tablet, Take 100 mg by mouth 2 (two) times daily. , Disp: , Rfl:  tamsulosin (FLOMAX) 0.4 MG CAPS capsule, Take 0.4 mg by mouth every other day. 5pm, Disp: , Rfl: ;  warfarin (COUMADIN) 5 MG tablet, Take 1 tablet (5 mg total) by mouth daily. 1 tablet po daily or as directed by MD., Disp: 90 tablet, Rfl: 0;  carvedilol (COREG) 6.25 MG tablet, Take 6.25 mg by mouth 2 (two) times daily with a meal., Disp: , Rfl:   Allergies:  Allergies  Allergen Reactions  . Moxifloxacin Hcl In Nacl  Other (See Comments) and Itching    Pt c/o being "out of his head" & itching  . Methylprednisolone Other (See Comments)    Dizziness, passed out twice  . Ramipril Other (See Comments)    Renal function worsened on ACEi.    Past Medical History, Surgical history, Social history, and Family History were reviewed and updated.  Review of Systems: As above  Physical Exam:  weight is 210 lb (95.255 kg). His temperature is 97.7 F (36.5 C). His blood pressure is 123/80 and his pulse is 90. His respiration is 12.   Elderly but well-nourished white gentleman in no obvious distress. Head and neck exam shows no ocular or oral lesion. He is no palpable cervical or supraclavicular lymph nodes. Lungs are clear bilaterally. He has no rales wheezes or rhonchi. Cardiac exam regular rate and rhythm. He may have an occasional extra beat. There is a 2/6 systolic murmur. Abdomen is soft. He has good bowel sounds. There is no fluid wave. There is no palpable liver or spleen. Extremities shows minimal edema in his lower legs. He has good range of motion of his joints. He has good strength in his legs. Neurological exam shows no focal neurological deficits. Skin exam shows no rashes, ecchymoses or petechia.  Lab Results  Component Value Date   WBC 8.8 07/25/2013   HGB  10.8* 07/25/2013   HCT 32.6* 07/25/2013   MCV 98 07/25/2013   PLT 179 07/25/2013     Chemistry      Component Value Date/Time   NA 137 04/11/2013 1035   K 3.8 04/11/2013 1035   CL 103 04/11/2013 1035   CO2 29 04/11/2013 1035   BUN 31* 04/11/2013 1035   CREATININE 1.8* 04/11/2013 1035      Component Value Date/Time   CALCIUM 9.6 04/11/2013 1035   ALKPHOS 109 02/22/2013 1337   AST 39* 02/22/2013 1337   ALT 38 02/22/2013 1337   BILITOT 0.8 02/22/2013 1337     INR is 2.6 Ferritin is 1279. Iron saturation is 70%. Impression and Plan: [name] is 80 year old gentleman with refractory anemia. He has done very well with this.  Of note, we are watching his  iron studies. We are seeing that he does not give iron overload. We have not yet had to use any iron chelation.  He does not need Aranesp today.  We will plan him back in 2 months. I think this would be very reasonable.   [name], MD 7/7/20151:18 PM

## 2013-08-04 ENCOUNTER — Other Ambulatory Visit: Payer: Self-pay | Admitting: Internal Medicine

## 2013-08-10 ENCOUNTER — Other Ambulatory Visit: Payer: Self-pay

## 2013-08-11 ENCOUNTER — Ambulatory Visit (INDEPENDENT_AMBULATORY_CARE_PROVIDER_SITE_OTHER): Payer: Medicare Other | Admitting: Internal Medicine

## 2013-08-11 ENCOUNTER — Encounter: Payer: Self-pay | Admitting: Internal Medicine

## 2013-08-11 VITALS — BP 120/72 | HR 82 | Ht 75.0 in | Wt 207.0 lb

## 2013-08-11 DIAGNOSIS — I5022 Chronic systolic (congestive) heart failure: Secondary | ICD-10-CM

## 2013-08-11 DIAGNOSIS — Z950 Presence of cardiac pacemaker: Secondary | ICD-10-CM

## 2013-08-11 DIAGNOSIS — I4891 Unspecified atrial fibrillation: Secondary | ICD-10-CM

## 2013-08-11 DIAGNOSIS — I509 Heart failure, unspecified: Secondary | ICD-10-CM

## 2013-08-11 DIAGNOSIS — I4821 Permanent atrial fibrillation: Secondary | ICD-10-CM

## 2013-08-11 DIAGNOSIS — I442 Atrioventricular block, complete: Secondary | ICD-10-CM

## 2013-08-11 LAB — MDC_IDC_ENUM_SESS_TYPE_INCLINIC
Brady Statistic RV Percent Paced: 98.6 %
Lead Channel Impedance Value: 684 Ohm
Lead Channel Pacing Threshold Amplitude: 1 V
Lead Channel Pacing Threshold Amplitude: 1.5 V
Lead Channel Setting Pacing Amplitude: 2.5 V
Lead Channel Setting Pacing Amplitude: 2.5 V
Lead Channel Setting Pacing Pulse Width: 0.4 ms
Lead Channel Setting Sensing Sensitivity: 5.6 mV
MDC IDC MSMT LEADCHNL LV PACING THRESHOLD PULSEWIDTH: 0.8 ms
MDC IDC MSMT LEADCHNL RV IMPEDANCE VALUE: 646 Ohm
MDC IDC MSMT LEADCHNL RV PACING THRESHOLD PULSEWIDTH: 0.4 ms
MDC IDC SET LEADCHNL LV PACING PULSEWIDTH: 0.8 ms
Zone Setting Detection Interval: 350 ms
Zone Setting Detection Interval: 400 ms

## 2013-08-11 NOTE — Progress Notes (Signed)
PCP: Thurman Coyer, MD Primary Cardiologist: Ellan Lambert Hammen is a 80 y.o. male who presents today for routine electrophysiology followup.  Since recent generator change, the patient reports doing very well.  He remains active gardening without significant functional limitations.   Today, he denies symptoms of palpitations, chest pain, shortness of breath,  lower extremity edema, dizziness, presyncope, or syncope.  The patient is otherwise without complaint today.   Past Medical History  Diagnosis Date  . Bursitis     OF THE RIGHT SHOULDER  . Tinnitus   . Permanent atrial fibrillation     s/p AV nodal ablation  . Depressed ejection fraction 10    EF previously 45-50%, s/p AV nodal ablation and biv pacemaker implant, previously enrolled in Block HF and was randomized to RV pacing only)  . Chronic anemia     RECIEVES ARANESP SHOTS IF HIS HEMOGLOBIN FALLS BELOW 11  . Blood transfusion     "several; Dr. Deberah Pelton" (02/06/2013)  . GERD (gastroesophageal reflux disease)   . MDS (myelodysplastic syndrome), low grade 02/10/2011  . Iron overload, transfusional 02/10/2011  . Complete heart block 12/28/06    s/p AV nodal ablation and BIV pacemaker implant at [facility] by Dr Rosita Fire  . CHF (congestive heart failure)   . Chronic bronchitis   . Pneumonia     "high school; [employer]; twice since, last time 12/ 2014" (02/06/2013)  . H/O hiatal hernia   . Arthritis     "hands, back" (02/06/2013)  . Gout   . Kidney stones 1985   Past Surgical History  Procedure Laterality Date  . Total knee arthroplasty Bilateral     right Dec.2011,left June 2012  . Joint replacement    . Pilonidal cyst excision  1970's  . Kidney stone surgery  1985    "tried to go up my penis; had to cut my stomach and go in for lodged stone"  . Av node ablation  12/08    at [facility]  . Insert / replace / remove pacemaker  12/28/2006; 04/11/2013    MDT CRTP implanted 2008 by Dr Rosita Fire at [facility];  gen change 03/2013 by Dr Lovena Le  . Bunionectomy Bilateral 11/2012- 01/2013    "left-right; great toes; gout and bunion OR" (02/06/2013)  . Inguinal hernia repair Right   . Lithotripsy  1985  . Kidney stone surgery  1986    "put a tube in my back"  . Cataract extraction w/ intraocular lens  implant, bilateral Bilateral     ROS- all systems are reviewed and negative except as per HPI above  Current Outpatient Prescriptions  Medication Sig Dispense Refill  . alfuzosin (UROXATRAL) 10 MG 24 hr tablet Take 1 tablet by mouth daily.      Marland Kitchen allopurinol (ZYLOPRIM) 300 MG tablet Take 300 mg by mouth daily.      . carvedilol (COREG) 6.25 MG tablet Take 6.25 mg by mouth 2 (two) times daily with a meal.      . famotidine (PEPCID) 20 MG tablet Take 40 mg by mouth at bedtime.       . finasteride (PROSCAR) 5 MG tablet Take 5 mg by mouth every evening. 5pm      . furosemide (LASIX) 40 MG tablet TAKE ONE TABLET BY MOUTH ONCE DAILY  90 tablet  0  . Multiple Vitamin (MULTIVITAMIN WITH MINERALS) TABS tablet Take 1 tablet by mouth daily.      Marland Kitchen pyridoxine (B-6) 100 MG tablet Take 100  mg by mouth 2 (two) times daily.       Marland Kitchen warfarin (COUMADIN) 5 MG tablet Take 1 tablet (5 mg total) by mouth daily. 1 tablet po daily or as directed by MD.  90 tablet  0   No current facility-administered medications for this visit.    Physical Exam: Filed Vitals:   08/11/13 0942  BP: 120/72  Pulse: 82  Height: 6\' 3"  (1.905 m)  Weight: 207 lb (93.895 kg)    GEN- The patient is well appearing, alert and oriented x 3 today.   Head- normocephalic, atraumatic Eyes-  Sclera clear, conjunctiva pink Ears- hearing intact Oropharynx- clear Lungs- Clear to ausculation bilaterally, normal work of breathing Chest- pacemaker pocket is well healed Heart- Regular rate and rhythm, no murmurs, rubs or gallops, PMI not laterally displaced GI- soft, NT, ND, + BS Extremities- no clubbing, cyanosis, or edema  Pacemaker interrogation-  reviewed in detail today,  See PACEART report  Assessment and Plan:  1. Complete heart block Normal pacemaker function See Pace Art report No changes today  2.  Permanent atrial fibrillation Continue Coumadin CHADS2VASC  3  3.  Chronic systolic dysfunction Stable

## 2013-08-11 NOTE — Patient Instructions (Addendum)
Remote monitoring is used to monitor your Pacemaker of ICD from home. This monitoring reduces the number of office visits required to check your device to one time per year. It allows [place] to keep an eye on the functioning of your device to ensure it is working properly. You are scheduled for a device check from home on November 13, 2013. You may send your transmission at any time that day. If you have a wireless device, the transmission will be sent automatically. After your physician reviews your transmission, you will receive a postcard with your next transmission date.   Your physician wants you to follow-up in:  1 year with Dr Rayann Heman.  You will receive a reminder letter in the mail two months in advance. If you don't receive a letter, please call our office to schedule the follow-up appointment.  Your physician recommends that you schedule a follow-up appointment with Dr Mare Ferrari in 3-4 months.  Cancel appointment for [facility]

## 2013-08-14 ENCOUNTER — Ambulatory Visit: Payer: Medicare Other | Admitting: Cardiology

## 2013-08-30 ENCOUNTER — Other Ambulatory Visit: Payer: Self-pay | Admitting: Hematology & Oncology

## 2013-09-26 ENCOUNTER — Ambulatory Visit (HOSPITAL_BASED_OUTPATIENT_CLINIC_OR_DEPARTMENT_OTHER): Payer: Medicare Other | Admitting: Hematology & Oncology

## 2013-09-26 ENCOUNTER — Encounter: Payer: Self-pay | Admitting: Hematology & Oncology

## 2013-09-26 ENCOUNTER — Ambulatory Visit (HOSPITAL_BASED_OUTPATIENT_CLINIC_OR_DEPARTMENT_OTHER): Payer: Medicare Other | Admitting: Lab

## 2013-09-26 VITALS — BP 107/71 | HR 90 | Temp 97.5°F | Resp 18 | Ht 69.0 in | Wt 208.0 lb

## 2013-09-26 DIAGNOSIS — I4821 Permanent atrial fibrillation: Secondary | ICD-10-CM

## 2013-09-26 DIAGNOSIS — I4891 Unspecified atrial fibrillation: Secondary | ICD-10-CM

## 2013-09-26 DIAGNOSIS — D4620 Refractory anemia with excess of blasts, unspecified: Secondary | ICD-10-CM

## 2013-09-26 LAB — MANUAL DIFFERENTIAL (CHCC SATELLITE)
ALC: 2.3 10*3/uL (ref 0.9–3.3)
ANC (CHCC HP manual diff): 3.8 10*3/uL (ref 1.5–6.5)
BASO: 1 % (ref 0–2)
Band Neutrophils: 0 % (ref 0–10)
Blasts: 0 % (ref 0–0)
EOS: 1 % (ref 0–7)
LYMPH: 31 % (ref 14–48)
METAMYELOCYTES PCT: 1 % — AB (ref 0–0)
MONO: 15 % — ABNORMAL HIGH (ref 0–13)
MYELOCYTES: 2 % — AB (ref 0–0)
OTHER CELLS: 0 % (ref 0–0)
OTHER COMMENTS: 0
PLT EST ~~LOC~~: DECREASED
PROMYELO: 0 % (ref 0–0)
SEG: 49 % (ref 40–75)
Variant Lymph: 0 % (ref 0–0)
nRBC: 0 % (ref 0–0)

## 2013-09-26 LAB — CBC WITH DIFFERENTIAL (CANCER CENTER ONLY)
HEMATOCRIT: 36.4 % — AB (ref 38.7–49.9)
HEMOGLOBIN: 11.9 g/dL — AB (ref 13.0–17.1)
MCH: 32.1 pg (ref 28.0–33.4)
MCHC: 32.7 g/dL (ref 32.0–35.9)
MCV: 98 fL (ref 82–98)
PLATELETS: 132 10*3/uL — AB (ref 145–400)
RBC: 3.71 10*6/uL — AB (ref 4.20–5.70)
RDW: 14.5 % (ref 11.1–15.7)
WBC: 7.4 10*3/uL (ref 4.0–10.0)

## 2013-09-26 LAB — PROTIME-INR (CHCC SATELLITE)
INR: 3.5 (ref 2.0–3.5)
Protime: 42 Seconds — ABNORMAL HIGH (ref 10.6–13.4)

## 2013-09-26 LAB — IRON AND TIBC CHCC
%SAT: 69 % — AB (ref 20–55)
Iron: 141 ug/dL (ref 42–163)
TIBC: 203 ug/dL (ref 202–409)
UIBC: 62 ug/dL — AB (ref 117–376)

## 2013-09-26 LAB — FERRITIN CHCC: Ferritin: 1529 ng/ml — ABNORMAL HIGH (ref 22–316)

## 2013-09-26 NOTE — Progress Notes (Signed)
Hematology and Oncology Follow Up Visit  [name] Marks [id number] 08-13-1927 80 y.o. 09/26/2013   Principle Diagnosis:   Refracted anemia-low risk by IPSS  Chronic atrial fibrillation  Current Therapy:    Coumadin-lifelong   Aranesp 300 mcg subcutaneous for hemoglobin less than 10     Interim History:  Mr.  Marks is back for followup. Last him back in July. He's been doing quite well. He's had no problems his last saw him. His been no bleeding. He's had no chest pain. He's had no headache. He's had no cough. He's had no change in bowel or bladder habits.  There's been no rash. He's had no unusual bruising. Prevacid no leg swelling. He's put on compression stockings on his legs. He said was to put these on, his leg swelling went away.  He's had no problems with nausea or vomiting.  His blood iron studies done back in July showed a ferritin of 1280. His iron saturation was 70%.  Overall, his performance status is ECOG 1 Medications: Current outpatient prescriptions:alfuzosin (UROXATRAL) 10 MG 24 hr tablet, Take 1 tablet by mouth daily., Disp: , Rfl: ;  allopurinol (ZYLOPRIM) 300 MG tablet, Take 300 mg by mouth daily., Disp: , Rfl: ;  carvedilol (COREG) 6.25 MG tablet, Take 6.25 mg by mouth 2 (two) times daily with a meal., Disp: , Rfl: ;  famotidine (PEPCID) 20 MG tablet, Take 40 mg by mouth at bedtime. , Disp: , Rfl:  finasteride (PROSCAR) 5 MG tablet, Take 5 mg by mouth every evening. 5pm, Disp: , Rfl: ;  furosemide (LASIX) 40 MG tablet, TAKE ONE TABLET BY MOUTH ONCE DAILY, Disp: 90 tablet, Rfl: 0;  Multiple Vitamin (MULTIVITAMIN WITH MINERALS) TABS tablet, Take 1 tablet by mouth daily., Disp: , Rfl: ;  pyridoxine (B-6) 100 MG tablet, Take 100 mg by mouth 2 (two) times daily. , Disp: , Rfl:  warfarin (COUMADIN) 5 MG tablet, TAKE ONE TABLET BY MOUTH ONCE DAILY AS DIRECTED BY  MD, Disp: 90 tablet, Rfl: 0  Allergies:  Allergies  Allergen Reactions  . Moxifloxacin Hcl In Nacl Other  (See Comments) and Itching    Pt c/o being "out of his head" & itching  . Methylprednisolone Other (See Comments)    Dizziness, passed out twice  . Ramipril Other (See Comments)    Renal function worsened on ACEi.    Past Medical History, Surgical history, Social history, and Family History were reviewed and updated.  Review of Systems: As above  Physical Exam:  height is 5\' 9"  (1.753 m) and weight is 208 lb (94.348 kg). His oral temperature is 97.5 F (36.4 C). His blood pressure is 107/71 and his pulse is 90. His respiration is 18.   Well-developed and well-nourished white woman. Head and neck exam shows no ocular or oral lesion. She has no palpable cervical or supraclavicular lymph nodes. Lungs are clear. He does arouse, wheezes or rhonchi. Cardiac exam is regular rate and rhythm. He has an occasional extra beat. His 326 systolic murmur. Abdomen soft. Has good bowel sounds. There is no fluid wave. There is no palpable liver or spleen tip. Extremities shows no clubbing, cyanosis or edema. His compression stockings on his legs. Skin exam shows no rashes, ecchymosis or petechia. Neurological exam shows no focal neurological deficits.  Lab Results  Component Value Date   WBC 7.4 09/26/2013   HGB 11.9* 09/26/2013   HCT 36.4* 09/26/2013   MCV 98 09/26/2013   PLT 132* 09/26/2013  Chemistry      Component Value Date/Time   NA 137 04/11/2013 1035   K 3.8 04/11/2013 1035   CL 103 04/11/2013 1035   CO2 29 04/11/2013 1035   BUN 31* 04/11/2013 1035   CREATININE 1.8* 04/11/2013 1035      Component Value Date/Time   CALCIUM 9.6 04/11/2013 1035   ALKPHOS 109 02/22/2013 1337   AST 39* 02/22/2013 1337   ALT 38 02/22/2013 1337   BILITOT 0.8 02/22/2013 1337         Impression and Plan: [name] Marks is a 26-year-old gentleman. He has refractory anemia. We do not have to give him any Aranesp. He's on incredibly well with this.  On his blood smear, I do not see any evidence of transition over to  leukemia.  His INR is 3.5. I will not adjust his Coumadin dose at all.  We will plan to him back here in another couple months now.   [name], MD 9/8/201510:32 AM

## 2013-10-09 ENCOUNTER — Ambulatory Visit (INDEPENDENT_AMBULATORY_CARE_PROVIDER_SITE_OTHER): Payer: Medicare Other

## 2013-10-09 ENCOUNTER — Ambulatory Visit (INDEPENDENT_AMBULATORY_CARE_PROVIDER_SITE_OTHER): Payer: Medicare Other | Admitting: Podiatry

## 2013-10-09 ENCOUNTER — Encounter: Payer: Self-pay | Admitting: Podiatry

## 2013-10-09 VITALS — BP 115/68 | HR 89 | Resp 18

## 2013-10-09 DIAGNOSIS — M79674 Pain in right toe(s): Secondary | ICD-10-CM

## 2013-10-09 DIAGNOSIS — M79609 Pain in unspecified limb: Secondary | ICD-10-CM

## 2013-10-09 DIAGNOSIS — M779 Enthesopathy, unspecified: Secondary | ICD-10-CM

## 2013-10-09 MED ORDER — TRIAMCINOLONE ACETONIDE 10 MG/ML IJ SUSP
10.0000 mg | Freq: Once | INTRAMUSCULAR | Status: AC
  Administered 2013-10-09: 10 mg

## 2013-10-09 NOTE — Progress Notes (Signed)
° °  Subjective:    Patient ID: [name], male    DOB: 1927-11-08, 80 y.o.   MRN: [id number]  HPI I HAD SURGERY BACK IN February OF THIS YEAR AND DR DUDA DONE IT AND  THE BIG TOE IS RUBBING AGAINST THE 2ND TOE ON MY RIGHT FOOT WHICH IS SWOLLEN AND RED AND I HAVE HAD A HISTORY OF GOUT AND IT HURTS A LOT     Review of Systems  HENT: Positive for hearing loss.        RINGING IN MY EARS  Respiratory: Positive for cough.   Musculoskeletal:       JOINT PAIN   Hematological: Bruises/bleeds easily.  All other systems reviewed and are negative.      Objective:   Physical Exam        Assessment & Plan:

## 2013-10-09 NOTE — Progress Notes (Signed)
Subjective:     Patient ID: [name], male   DOB: [birth year], 80 y.o.   MRN: [id number]  HPI patient presents after not been seen for a number of years with pain in the second toe with fluid buildup around the interphalangeal joint and a history of having fusion of his big toe joint right in February by Dr. Sharol Given. Patient states that the second toe has been bothering him a lot and he is worried it may be gout   Review of Systems  All other systems reviewed and are negative.      Objective:   Physical Exam  Nursing note and vitals reviewed. Constitutional: He is oriented to person, place, and time.  Cardiovascular: Intact distal pulses.   Musculoskeletal: Normal range of motion.  Neurological: He is oriented to person, place, and time.  Skin: Skin is warm and dry.   neurovascular status is intact with muscle strength adequate range of motion subtalar midtarsal joint within normal limits. Patient's noted to have a well-healed scar second metatarsal phalangeal with no motion of the joint and adequate fusion which has occurred in a history of having had fusion of the left first metatarsal phalangeal joint. Patient's noted to have inflammation with fluid buildup around the interphalangeal joint second toe right it's moderately painful when pressed and is also noted to have depression of the arch upon weightbearing     Assessment:     Long-term arthritic patient with history of gout with inflammatory changes second digit interphalangeal joint right foot    Plan:     H&P and x-rays reviewed. Injected the right second interphalangeal joint 3 mg dexamethasone Kenalog and did this after doing a proximal nerve block. Patient tolerated well and will be seen back as needed

## 2013-10-11 ENCOUNTER — Other Ambulatory Visit: Payer: Self-pay

## 2013-10-11 MED ORDER — CARVEDILOL 6.25 MG PO TABS: 6.2500 mg | ORAL_TABLET | Freq: Two times a day (BID) | ORAL | Status: DC

## 2013-10-19 ENCOUNTER — Inpatient Hospital Stay (HOSPITAL_BASED_OUTPATIENT_CLINIC_OR_DEPARTMENT_OTHER)
Admission: EM | Admit: 2013-10-19 | Discharge: 2013-10-26 | DRG: 871 | Disposition: A | Discharge status: Home / Self Care | Payer: Medicare Other | Attending: Family Medicine | Admitting: Family Medicine

## 2013-10-19 ENCOUNTER — Emergency Department (HOSPITAL_BASED_OUTPATIENT_CLINIC_OR_DEPARTMENT_OTHER): Payer: Medicare Other

## 2013-10-19 ENCOUNTER — Encounter (HOSPITAL_BASED_OUTPATIENT_CLINIC_OR_DEPARTMENT_OTHER): Payer: Self-pay | Admitting: Emergency Medicine

## 2013-10-19 DIAGNOSIS — I5022 Chronic systolic (congestive) heart failure: Secondary | ICD-10-CM | POA: Diagnosis not present

## 2013-10-19 DIAGNOSIS — D4620 Refractory anemia with excess of blasts, unspecified: Secondary | ICD-10-CM | POA: Diagnosis not present

## 2013-10-19 DIAGNOSIS — I442 Atrioventricular block, complete: Secondary | ICD-10-CM | POA: Diagnosis present

## 2013-10-19 DIAGNOSIS — R509 Fever, unspecified: Secondary | ICD-10-CM | POA: Diagnosis present

## 2013-10-19 DIAGNOSIS — J90 Pleural effusion, not elsewhere classified: Secondary | ICD-10-CM | POA: Diagnosis not present

## 2013-10-19 DIAGNOSIS — K449 Diaphragmatic hernia without obstruction or gangrene: Secondary | ICD-10-CM | POA: Diagnosis not present

## 2013-10-19 DIAGNOSIS — Z7901 Long term (current) use of anticoagulants: Secondary | ICD-10-CM | POA: Diagnosis not present

## 2013-10-19 DIAGNOSIS — I4891 Unspecified atrial fibrillation: Secondary | ICD-10-CM

## 2013-10-19 DIAGNOSIS — I429 Cardiomyopathy, unspecified: Secondary | ICD-10-CM | POA: Diagnosis present

## 2013-10-19 DIAGNOSIS — I482 Chronic atrial fibrillation: Secondary | ICD-10-CM | POA: Diagnosis not present

## 2013-10-19 DIAGNOSIS — K219 Gastro-esophageal reflux disease without esophagitis: Secondary | ICD-10-CM | POA: Diagnosis present

## 2013-10-19 DIAGNOSIS — D1803 Hemangioma of intra-abdominal structures: Secondary | ICD-10-CM | POA: Diagnosis present

## 2013-10-19 DIAGNOSIS — N183 Chronic kidney disease, stage 3 unspecified: Secondary | ICD-10-CM

## 2013-10-19 DIAGNOSIS — J189 Pneumonia, unspecified organism: Secondary | ICD-10-CM | POA: Diagnosis present

## 2013-10-19 DIAGNOSIS — R791 Abnormal coagulation profile: Secondary | ICD-10-CM | POA: Diagnosis not present

## 2013-10-19 DIAGNOSIS — J449 Chronic obstructive pulmonary disease, unspecified: Secondary | ICD-10-CM | POA: Diagnosis not present

## 2013-10-19 DIAGNOSIS — Z950 Presence of cardiac pacemaker: Secondary | ICD-10-CM

## 2013-10-19 DIAGNOSIS — A419 Sepsis, unspecified organism: Principal | ICD-10-CM

## 2013-10-19 DIAGNOSIS — IMO0001 Reserved for inherently not codable concepts without codable children: Secondary | ICD-10-CM | POA: Insufficient documentation

## 2013-10-19 DIAGNOSIS — I4821 Permanent atrial fibrillation: Secondary | ICD-10-CM | POA: Diagnosis present

## 2013-10-19 DIAGNOSIS — Z87891 Personal history of nicotine dependence: Secondary | ICD-10-CM

## 2013-10-19 DIAGNOSIS — D46Z Other myelodysplastic syndromes: Secondary | ICD-10-CM | POA: Diagnosis present

## 2013-10-19 LAB — TROPONIN I: Troponin I: <0.3 ng/mL (ref <0.30)

## 2013-10-19 LAB — COMPREHENSIVE METABOLIC PANEL
ALK PHOS: 76 U/L (ref 39–117)
ALT: 33 U/L (ref 0–53)
ANION GAP: 13 (ref 5–15)
AST: 28 U/L (ref 0–37)
Albumin: 3.4 g/dL — ABNORMAL LOW (ref 3.5–5.2)
BILIRUBIN TOTAL: 1.6 mg/dL — AB (ref 0.3–1.2)
BUN: 32 mg/dL — AB (ref 6–23)
CHLORIDE: 98 meq/L (ref 96–112)
CO2: 24 meq/L (ref 19–32)
Calcium: 9 mg/dL (ref 8.4–10.5)
Creatinine, Ser: 2 mg/dL — ABNORMAL HIGH (ref 0.50–1.35)
GFR calc non Af Amer: 29 mL/min — ABNORMAL LOW (ref >90)
GFR, EST AFRICAN AMERICAN: 34 mL/min — AB (ref >90)
GLUCOSE: 107 mg/dL — AB (ref 70–99)
Potassium: 4.2 mEq/L (ref 3.7–5.3)
SODIUM: 135 meq/L — AB (ref 137–147)
TOTAL PROTEIN: 7 g/dL (ref 6.0–8.3)

## 2013-10-19 LAB — CBC WITH DIFFERENTIAL/PLATELET
BAND NEUTROPHILS: 3 % (ref 0–10)
BASOS PCT: 0 % (ref 0–1)
Basophils Absolute: 0 10*3/uL (ref 0.0–0.1)
EOS PCT: 0 % (ref 0–5)
Eosinophils Absolute: 0 10*3/uL (ref 0.0–0.7)
HEMATOCRIT: 30.7 % — AB (ref 39.0–52.0)
Hemoglobin: 10.3 g/dL — ABNORMAL LOW (ref 13.0–17.0)
LYMPHS ABS: 0.4 10*3/uL — AB (ref 0.7–4.0)
Lymphocytes Relative: 2 % — ABNORMAL LOW (ref 12–46)
MCH: 32.2 pg (ref 26.0–34.0)
MCHC: 33.6 g/dL (ref 30.0–36.0)
MCV: 95.9 fL (ref 78.0–100.0)
MONOS PCT: 18 % — AB (ref 3–12)
Monocytes Absolute: 3.9 10*3/uL — ABNORMAL HIGH (ref 0.1–1.0)
Neutro Abs: 17.3 10*3/uL — ABNORMAL HIGH (ref 1.7–7.7)
Neutrophils Relative %: 77 % (ref 43–77)
PLATELETS: 124 10*3/uL — AB (ref 150–400)
RBC: 3.2 MIL/uL — AB (ref 4.22–5.81)
RDW: 14.3 % (ref 11.5–15.5)
WBC: 21.6 10*3/uL — AB (ref 4.0–10.5)

## 2013-10-19 LAB — URINALYSIS, ROUTINE W REFLEX MICROSCOPIC
BILIRUBIN URINE: NEGATIVE
GLUCOSE, UA: NEGATIVE mg/dL
Hgb urine dipstick: NEGATIVE
Ketones, ur: NEGATIVE mg/dL
Leukocytes, UA: NEGATIVE
Nitrite: NEGATIVE
Protein, ur: NEGATIVE mg/dL
SPECIFIC GRAVITY, URINE: 1.018 (ref 1.005–1.030)
UROBILINOGEN UA: 0.2 mg/dL (ref 0.0–1.0)
pH: 5 (ref 5.0–8.0)

## 2013-10-19 LAB — PROTIME-INR
INR: 4.07 — ABNORMAL HIGH (ref 0.00–1.49)
Prothrombin Time: 39.5 seconds — ABNORMAL HIGH (ref 11.6–15.2)

## 2013-10-19 LAB — I-STAT CG4 LACTIC ACID, ED: Lactic Acid, Venous: 1.62 mmol/L (ref 0.5–2.2)

## 2013-10-19 MED ORDER — ADULT MULTIVITAMIN W/MINERALS CH
1.0000 | ORAL_TABLET | Freq: Every day | ORAL | Status: DC
  Administered 2013-10-19 – 2013-10-26 (×8): 1 via ORAL
  Filled 2013-10-19 (×8): qty 1

## 2013-10-19 MED ORDER — FENTANYL CITRATE 0.05 MG/ML IJ SOLN
50.0000 ug | Freq: Once | INTRAMUSCULAR | Status: AC
  Administered 2013-10-19: 50 ug via INTRAVENOUS
  Filled 2013-10-19: qty 2

## 2013-10-19 MED ORDER — SODIUM CHLORIDE 0.9 % IJ SOLN
3.0000 mL | Freq: Two times a day (BID) | INTRAMUSCULAR | Status: DC
  Administered 2013-10-20 – 2013-10-26 (×8): 3 mL via INTRAVENOUS

## 2013-10-19 MED ORDER — HYDROCODONE-HOMATROPINE 5-1.5 MG/5ML PO SYRP
5.0000 mL | ORAL_SOLUTION | ORAL | Status: DC | PRN
  Administered 2013-10-19 – 2013-10-22 (×5): 5 mL via ORAL
  Filled 2013-10-19 (×5): qty 5

## 2013-10-19 MED ORDER — DEXTROSE 5 % IV SOLN
500.0000 mg | Freq: Once | INTRAVENOUS | Status: AC
  Administered 2013-10-19: 500 mg via INTRAVENOUS
  Filled 2013-10-19: qty 500

## 2013-10-19 MED ORDER — ACETAMINOPHEN 325 MG PO TABS
650.0000 mg | ORAL_TABLET | Freq: Once | ORAL | Status: AC
  Administered 2013-10-19: 650 mg via ORAL
  Filled 2013-10-19: qty 2

## 2013-10-19 MED ORDER — SODIUM CHLORIDE 0.9 % IV BOLUS (SEPSIS)
500.0000 mL | Freq: Once | INTRAVENOUS | Status: AC
  Administered 2013-10-19: 500 mL via INTRAVENOUS

## 2013-10-19 MED ORDER — VITAMIN B-6 100 MG PO TABS
100.0000 mg | ORAL_TABLET | Freq: Two times a day (BID) | ORAL | Status: DC
  Administered 2013-10-19 – 2013-10-26 (×14): 100 mg via ORAL
  Filled 2013-10-19 (×15): qty 1

## 2013-10-19 MED ORDER — CEFTRIAXONE SODIUM 1 G IJ SOLR
INTRAMUSCULAR | Status: AC
  Administered 2013-10-19: 1000 mg
  Filled 2013-10-19: qty 10

## 2013-10-19 MED ORDER — ACETAMINOPHEN 325 MG PO TABS
650.0000 mg | ORAL_TABLET | Freq: Four times a day (QID) | ORAL | Status: DC | PRN
Start: 2013-10-19 — End: 2013-10-26
  Administered 2013-10-20 – 2013-10-25 (×7): 650 mg via ORAL
  Filled 2013-10-19 (×7): qty 2

## 2013-10-19 MED ORDER — AZITHROMYCIN 500 MG PO TABS
500.0000 mg | ORAL_TABLET | Freq: Every day | ORAL | Status: DC
  Administered 2013-10-20 – 2013-10-24 (×5): 500 mg via ORAL
  Filled 2013-10-19 (×5): qty 1

## 2013-10-19 MED ORDER — ONDANSETRON HCL 4 MG PO TABS: 4.0000 mg | ORAL_TABLET | Freq: Four times a day (QID) | ORAL | Status: DC | PRN

## 2013-10-19 MED ORDER — DEXTROSE 5 % IV SOLN: 1.0000 g | Freq: Once | INTRAVENOUS | Status: AC

## 2013-10-19 MED ORDER — ACETAMINOPHEN 650 MG RE SUPP: 650.0000 mg | Freq: Four times a day (QID) | RECTAL | Status: DC | PRN

## 2013-10-19 MED ORDER — SODIUM CHLORIDE 0.9 % IV SOLN
INTRAVENOUS | Status: AC
  Administered 2013-10-19: 75 mL/h via INTRAVENOUS

## 2013-10-19 MED ORDER — ALLOPURINOL 300 MG PO TABS
300.0000 mg | ORAL_TABLET | Freq: Every day | ORAL | Status: DC
  Administered 2013-10-19 – 2013-10-26 (×8): 300 mg via ORAL
  Filled 2013-10-19 (×8): qty 1

## 2013-10-19 MED ORDER — SODIUM CHLORIDE 0.9 % IV BOLUS (SEPSIS)
1000.0000 mL | Freq: Once | INTRAVENOUS | Status: AC
  Administered 2013-10-19: 1000 mL via INTRAVENOUS

## 2013-10-19 MED ORDER — CEFTRIAXONE SODIUM 1 G IJ SOLR
1.0000 g | INTRAMUSCULAR | Status: DC
  Administered 2013-10-20 – 2013-10-25 (×6): 1 g via INTRAVENOUS
  Filled 2013-10-19 (×7): qty 10

## 2013-10-19 MED ORDER — FINASTERIDE 5 MG PO TABS
5.0000 mg | ORAL_TABLET | Freq: Every evening | ORAL | Status: DC
  Administered 2013-10-19 – 2013-10-25 (×7): 5 mg via ORAL
  Filled 2013-10-19 (×8): qty 1

## 2013-10-19 MED ORDER — ONDANSETRON HCL 4 MG/2ML IJ SOLN
4.0000 mg | Freq: Four times a day (QID) | INTRAMUSCULAR | Status: DC | PRN
Start: 2013-10-19 — End: 2013-10-26

## 2013-10-19 MED ORDER — HYDROCODONE-ACETAMINOPHEN 5-325 MG PO TABS
1.0000 | ORAL_TABLET | ORAL | Status: DC | PRN
  Administered 2013-10-19: 1 via ORAL
  Filled 2013-10-19: qty 2

## 2013-10-19 MED ORDER — FAMOTIDINE 20 MG PO TABS
20.0000 mg | ORAL_TABLET | Freq: Every day | ORAL | Status: DC
  Administered 2013-10-19 – 2013-10-25 (×7): 20 mg via ORAL
  Filled 2013-10-19 (×8): qty 1

## 2013-10-19 MED ORDER — ALFUZOSIN HCL ER 10 MG PO TB24
10.0000 mg | ORAL_TABLET | Freq: Every day | ORAL | Status: DC
  Administered 2013-10-19 – 2013-10-26 (×8): 10 mg via ORAL
  Filled 2013-10-19 (×9): qty 1

## 2013-10-19 NOTE — ED Notes (Signed)
Patient states he developed pain in the right posterior hip radiating into mid right back.  Denies injury.  Mowed on riding mower yesterday.

## 2013-10-19 NOTE — ED Notes (Signed)
Patient transported to CT and returned 

## 2013-10-19 NOTE — H&P (Signed)
History and Physical  REASON HELZER NIO:[id number] DOB: 1927-08-17 DOA: 10/19/2013   PCP: Thurman Coyer, MD   Chief Complaint: right flank pain  HPI:  80 year old male with a history of MDS-- refractory anemia (follows Ennever) on Aranesp, third-degree heart block s/p PPM, cardiomyopathy with EF 25-30%, permanent atrial fibrillation, and COPD presents with one-day history of right flank pain extending from the patient's right-sided chest to right upper abdomen which began approximately 12-24 hours prior to admission. The patient denies any recent injury or trauma. Incidentally, the patient saw his primary care provider approximately 2 days prior to this admission. There was concern for bronchitis and the patient was started on antibiotics and prednisone. He does not recall the name of the antibiotic, nor does he know the dose of his prednisone that he was taking. The patient did state that he had been only taking it for 2 days before stopping it last night. He complains of a nonproductive cough without hemoptysis. He had subjective fevers and chills at home, but had an emergency department temperature of 102.65F. He denies any vomiting, diarrhea, abdominal pain, dysuria, hematuria, hematochezia, melena. He denies any headache, visual disturbance. He does complain of right-sided chest pain and right upper abdominal pain with coughing and deep inspiration.  In the emergency department, the patient was noted to have a temperature 102.65F. Labs revealed serum creatinine 2.00. Hepatic enzymes were negative. WBC was 21.6. Lactic acid 1.62. INR was 4.07. Urinalysis was negative for pyuria or hematuria. Assessment/Plan: Sepsis  -Present at the time of admission with leukocytosis fever  -Secondary to pneumonia  -The patient has received 1.5 L of normal saline  -Appears to have failed outpatient therapy  -Follow blood cultures -Leukocytosis is probably attributable to recent prednisone in the  outpatient setting community acquired pneumonia -Continue azithromycin and ceftriaxone that was started in the emergency department -Urine Legionella antigen, urine Streptococcus pneumoniae antigen -Although chest x-ray did not reveal gross consolidation, CT of the chest revealed a posterior right lower lobe opacity Right chest pain/right flank pain  -May be related to  RLL PNA  -antitussive -cycle troponins Hypodensity left lobe liver  -Unfortunately MRI cannot be obtained due to the patient PPM -Cannot give intravenous contrast at this time due to the patient's CKD -will need outpt surveillance -will need to speak with radiology regarding any other modalities to clarify the abnormality -LFTs are normal Permanent atrial fibrillation  -Continue warfarin  -Mildly supratherapeutic INR likely due to recent antibiotics  Cardiomyopathy  -Echocardiogram 02/06/2013 showed EF 25-30%  -Presently euvolemic  Complete heart block  -s/p PPM CKD stage III -baseline creatinine 1.6-1.8 -pt already given 1.5L NS, will not give any additional as pt taking good po       Past Medical History  Diagnosis Date  . Bursitis     OF THE RIGHT SHOULDER  . Tinnitus   . Permanent atrial fibrillation     s/p AV nodal ablation  . Depressed ejection fraction 10    EF previously 45-50%, s/p AV nodal ablation and biv pacemaker implant, previously enrolled in Block HF and was randomized to RV pacing only)  . Chronic anemia     RECIEVES ARANESP SHOTS IF HIS HEMOGLOBIN FALLS BELOW 11  . Blood transfusion     "several; Dr. Deberah Pelton" (02/06/2013)  . GERD (gastroesophageal reflux disease)   . MDS (myelodysplastic syndrome), low grade 02/10/2011  . Iron overload, transfusional 02/10/2011  . Complete heart block 12/28/06    s/p  AV nodal ablation and BIV pacemaker implant at [facility] by Dr Rosita Fire  . CHF (congestive heart failure)   . Chronic bronchitis   . Pneumonia     "high school; [employer]; twice  since, last time 12/ 2014" (02/06/2013)  . H/O hiatal hernia   . Arthritis     "hands, back" (02/06/2013)  . Gout   . Kidney stones 1985   Past Surgical History  Procedure Laterality Date  . Total knee arthroplasty Bilateral     right Dec.2011,left June 2012  . Joint replacement    . Pilonidal cyst excision  1970's  . Kidney stone surgery  1985    "tried to go up my penis; had to cut my stomach and go in for lodged stone"  . Av node ablation  12/08    at [facility]  . Insert / replace / remove pacemaker  12/28/2006; 04/11/2013    MDT CRTP implanted 2008 by Dr Rosita Fire at [facility]; gen change 03/2013 by Dr Lovena Le  . Bunionectomy Bilateral 11/2012- 01/2013    "left-right; great toes; gout and bunion OR" (02/06/2013)  . Inguinal hernia repair Right   . Lithotripsy  1985  . Kidney stone surgery  1986    "put a tube in my back"  . Cataract extraction w/ intraocular lens  implant, bilateral Bilateral    Social History:  reports that he quit smoking about 45 years ago. His smoking use included Cigarettes. He started smoking about 69 years ago. He has a 25 pack-year smoking history. He has never used smokeless tobacco. He reports that he does not drink alcohol or use illicit drugs.   Family History  Problem Relation Age of Onset  . Cancer Mother   . Melanoma Mother   . COPD Father   . Kidney disease Brother      Allergies  Allergen Reactions  . Moxifloxacin Hcl In Nacl Other (See Comments) and Itching    Pt c/o being "out of his head" & itching  . Methylprednisolone Other (See Comments)    Dizziness, passed out twice  . Ramipril Other (See Comments)    Renal function worsened on ACEi.      Prior to Admission medications   Medication Sig Start Date End Date Taking? Authorizing Provider  alfuzosin (UROXATRAL) 10 MG 24 hr tablet Take 1 tablet by mouth daily. 07/22/13  Yes Historical Provider, MD  allopurinol (ZYLOPRIM) 300 MG tablet Take 300 mg by mouth daily. 07/26/13  Yes  Historical Provider, MD  carvedilol (COREG) 6.25 MG tablet Take 1 tablet (6.25 mg total) by mouth 2 (two) times daily with a meal. 10/11/13  Yes Darlin Coco, MD  famotidine (PEPCID) 20 MG tablet Take 40 mg by mouth at bedtime.    Yes Historical Provider, MD  finasteride (PROSCAR) 5 MG tablet Take 5 mg by mouth every evening. 5pm   Yes Historical Provider, MD  furosemide (LASIX) 40 MG tablet TAKE ONE TABLET BY MOUTH ONCE DAILY 08/04/13  Yes Deboraha Sprang, MD  Multiple Vitamin (MULTIVITAMIN WITH MINERALS) TABS tablet Take 1 tablet by mouth daily.   Yes Historical Provider, MD  pyridoxine (B-6) 100 MG tablet Take 100 mg by mouth 2 (two) times daily.    Yes Historical Provider, MD  warfarin (COUMADIN) 5 MG tablet TAKE ONE TABLET BY MOUTH ONCE DAILY AS DIRECTED BY  MD 08/31/13  Yes [name], MD    Review of Systems:  Constitutional:  No weight loss, night sweats Head&Eyes:  No headache.  No vision loss.  No eye pain or scotoma ENT:  No Difficulty swallowing,Tooth/dental problems,Sore throat,   Cardio-vascular:  No Orthopnea, PND, swelling in lower extremities,  dizziness, palpitations  GI:  No  abdominal pain, nausea, vomiting, diarrhea, loss of appetite, hematochezia, melena, heartburn, indigestion, Resp:  No shortness of breath with exertion or at rest. No coughing up of blood .No wheezing.No chest wall deformity  Skin:  no rash or lesions.  GU:  no dysuria, change in color of urine, no urgency or frequency.  Musculoskeletal:  No joint pain or swelling. No decreased range of motion. No back pain.  Psych:  No change in mood or affect. No depression or anxiety. Neurologic: No headache, no dysesthesia, no focal weakness, no vision loss. No syncope  Physical Exam: Filed Vitals:   10/19/13 1610 10/19/13 1630 10/19/13 1721 10/19/13 1830  BP: 105/53 104/60 102/63 103/60  Pulse: 80 80 79 79  Temp:    98.3 F (36.8 C)  TempSrc:    Oral  Resp: 25 22 28 18   Height:    6\' 2"   (1.88 m)  Weight:    96.1 kg (211 lb 13.8 oz)  SpO2: 96% 96% 96% 97%   General:  A&O x 3, NAD, nontoxic, pleasant/cooperative Head/Eye: No conjunctival hemorrhage, no icterus, [place]/AT, No nystagmus ENT:  No icterus,  No thrush, good dentition, no pharyngeal exudate Neck:  No masses, no lymphadenpathy, no meningismus CV:  IRRR, no rub, no gallop, no S3 Lung:  Bibasilar rhonchi R>L, no wheeze, good air movt Abdomen: soft/NT, +BS, nondistended, no peritoneal signs Ext: No cyanosis, No rashes, No petechiae, No lymphangitis, No edema Neuro: CNII-XII intact, strength 4/5 in bilateral upper and lower extremities, no dysmetria  Labs on Admission:  Basic Metabolic Panel:  Recent Labs Lab 10/19/13 1345  NA 135*  K 4.2  CL 98  CO2 24  GLUCOSE 107*  BUN 32*  CREATININE 2.00*  CALCIUM 9.0   Liver Function Tests:  Recent Labs Lab 10/19/13 1345  AST 28  ALT 33  ALKPHOS 76  BILITOT 1.6*  PROT 7.0  ALBUMIN 3.4*   No results found for this basename: LIPASE, AMYLASE,  in the last 168 hours No results found for this basename: AMMONIA,  in the last 168 hours CBC:  Recent Labs Lab 10/19/13 1345  WBC 21.6*  NEUTROABS 17.3*  HGB 10.3*  HCT 30.7*  MCV 95.9  PLT 124*   Cardiac Enzymes: No results found for this basename: CKTOTAL, CKMB, CKMBINDEX, TROPONINI,  in the last 168 hours BNP: No components found with this basename: POCBNP,  CBG: No results found for this basename: GLUCAP,  in the last 168 hours  Radiological Exams on Admission: Dg Chest 2 View  10/19/2013   CLINICAL DATA:  One day history of chest pain with cough and fever  EXAM: CHEST  2 VIEW  COMPARISON:  June 23, 2010  FINDINGS: There is bibasilar lung scarring. There is no appreciable edema or consolidation. Heart is upper normal in size with pulmonary vascularity within normal limits. No adenopathy. Pacemaker leads positions are stable compared to prior study. There is degenerative change in the thoracic spine.   IMPRESSION: Bibasilar scarring. No edema or consolidation. No change in cardiac silhouette.   Electronically Signed   By: Lowella Grip M.D.   On: 10/19/2013 12:20   Ct Chest Wo Contrast  10/19/2013   CLINICAL DATA:  Fever, right posterior back pain.  EXAM: CT CHEST WITHOUT CONTRAST  TECHNIQUE: Multidetector CT imaging of the chest was performed following the standard protocol without IV contrast.  COMPARISON:  Radiograph of same day.  FINDINGS: No pneumothorax is noted. Mild right pleural effusion is noted. Mild airspace opacity is noted medially in the posterior portion the right lower lobe consistent with subsegmental atelectasis or possibly pneumonia. Mild cardiomegaly is noted. Left-sided pacemaker is noted with leads in grossly good position. Anterior osteophyte formation is seen in the lower thoracic spine. No mediastinal mass or adenopathy is noted. Coronary artery calcifications are noted. 4.4 x 3.2 cm low density is noted in the superior portion of the liver with Hounsfield measurement of 32. Further evaluation with MRI is recommended.  IMPRESSION: Mild right pleural effusion.  Airspace opacity is seen medially in posterior portion of right lower lobe consistent with pneumonia or subsegmental atelectasis.  4.4 cm low density is noted in the superior portion the liver which is not diagnostic for cyst and concerning for possible neoplasm. Further evaluation with MRI is recommended.   Electronically Signed   By: Sabino Dick M.D.   On: 10/19/2013 15:19   Ct Renal Stone Study  10/19/2013   CLINICAL DATA:  Right flank pain beginning last night.  EXAM: CT ABDOMEN AND PELVIS WITHOUT CONTRAST  TECHNIQUE: Multidetector CT imaging of the abdomen and pelvis was performed following the standard protocol without IV contrast.  COMPARISON:  None.  FINDINGS: The patient has a small right pleural effusion but there is no left pleural effusions. Small pericardial effusion is identified. There is cardiomegaly.  The  adrenal glands, spleen, pancreas and biliary tree are unremarkable. The gallbladder appears normal. There is a hypo attenuating lesion in the left hepatic lobe measuring 2.7 x 3.7 cm on image 19. No lymphadenopathy or fluid is identified. Colonic diverticulosis is worst in the sigmoid but there is no diverticulitis. The colon is otherwise unremarkable. The stomach and small bowel appear normal. Extensive aortoiliac atherosclerosis without aneurysm is seen. No lytic or sclerotic bony lesion is identified.  IMPRESSION: Nonspecific hypo attenuating lesion left hepatic lobe could be a benign entity such as a hemangioma but may represent primary or metastatic neoplasm. MRI of the liver without without contrast is recommended for further evaluation.  Small right pleural effusion and small pericardial effusion.  Nonobstructing stone lower pole left kidney.  Diverticulosis without diverticulitis.   Electronically Signed   By: Inge Rise M.D.   On: 10/19/2013 14:19    EKG: Independently reviewed. pending    Time spent:60 minutes Code Status:   FULL Family Communication:   No Family at bedside   Nashanti Duquette, DO  [facility] Pager [phone]  If 7PM-7AM, please contact night-coverage [email or web address] Password [facility] 10/19/2013, 6:57 PM

## 2013-10-19 NOTE — ED Notes (Signed)
Patient transported to X-ray 

## 2013-10-19 NOTE — ED Notes (Signed)
MD at bedside. 

## 2013-10-19 NOTE — Progress Notes (Signed)
ANTICOAGULATION CONSULT NOTE - Initial Consult  Pharmacy Consult for Warfarin Indication: atrial fibrillation  Allergies  Allergen Reactions  . Moxifloxacin Hcl In Nacl Other (See Comments) and Itching    Pt c/o being "out of his head" & itching  . Methylprednisolone Other (See Comments)    Dizziness, passed out twice  . Ramipril Other (See Comments)    Renal function worsened on ACEi.    Patient Measurements: Height: 6\' 2"  (188 cm) Weight: 211 lb 13.8 oz (96.1 kg) IBW/kg (Calculated) : 82.2  Vital Signs: Temp: 98.3 F (36.8 C) (10/01 1830) Temp Source: Oral (10/01 1830) BP: 103/60 mmHg (10/01 1830) Pulse Rate: 79 (10/01 1830)  Labs:  Recent Labs  10/19/13 1345  HGB 10.3*  HCT 30.7*  PLT 124*  LABPROT 39.5*  INR 4.07*  CREATININE 2.00*    Estimated Creatinine Clearance: 32 ml/min (by C-G formula based on Cr of 2).   Medical History: Past Medical History  Diagnosis Date  . Bursitis     OF THE RIGHT SHOULDER  . Tinnitus   . Permanent atrial fibrillation     s/p AV nodal ablation  . Depressed ejection fraction 10    EF previously 45-50%, s/p AV nodal ablation and biv pacemaker implant, previously enrolled in Block HF and was randomized to RV pacing only)  . Chronic anemia     RECIEVES ARANESP SHOTS IF HIS HEMOGLOBIN FALLS BELOW 11  . Blood transfusion     "several; Dr. Deberah Pelton" (02/06/2013)  . GERD (gastroesophageal reflux disease)   . MDS (myelodysplastic syndrome), low grade 02/10/2011  . Iron overload, transfusional 02/10/2011  . Complete heart block 12/28/06    s/p AV nodal ablation and BIV pacemaker implant at [facility] by Dr Rosita Fire  . CHF (congestive heart failure)   . Chronic bronchitis   . Pneumonia     "high school; [employer]; twice since, last time 12/ 2014" (02/06/2013)  . H/O hiatal hernia   . Arthritis     "hands, back" (02/06/2013)  . Gout   . Kidney stones 1985    Medications:  Scheduled:  . sodium chloride   Intravenous STAT   . alfuzosin  10 mg Oral Daily  . allopurinol  300 mg Oral Daily  . azithromycin  500 mg Oral Daily  . cefTRIAXone (ROCEPHIN)  IV  1 g Intravenous Q24H  . famotidine  20 mg Oral QHS  . finasteride  5 mg Oral QPM  . multivitamin with minerals  1 tablet Oral Daily  . pyridoxine  100 mg Oral BID  . sodium chloride  3 mL Intravenous Q12H   Infusions:   PRN: acetaminophen, acetaminophen, HYDROcodone-acetaminophen, HYDROcodone-homatropine, ondansetron (ZOFRAN) IV, ondansetron  Assessment: 80 yo male with MDS admitted 10/1 with flank pain. On chronic warfarin for atrial fibrillation. Pharmacy is consulted to continue dosing on admission.    Home dose reported as 5mg  daily - last taken 9/30  INR is supratherapeutic (4.07) likely from recent antibiotic use for bronchitis.  CBC low, but consistent with previous results given MDS  No bleeding reported  Concurrent antibiotics for pneumonia may increase warfarin sensitivity   Goal of Therapy:  INR 2-3   Plan:   No warfarin today  Daily PT/INR  Peggyann Juba, PharmD, BCPS Pager: [phone] 10/19/2013,8:24 PM

## 2013-10-19 NOTE — Progress Notes (Signed)
Hospitalist requested to have directly the patient for admission at [facility] long telemetry. Patient presents with complaints of flank pain, resembles renal colic, CT abdomen and pelvis did not show any evidence of renal stones, there was an evidence of the liver lesion, patient has been complaining of cough with productive sputum, started by his PCP on oral steroids and antibiotics. Has leukocytosis of 21,000, most likely related to steroids, as well patient is febrile, charted on IV Rocephin and azithromycin for community-acquired pneumonia. Problem list: -Sepsis -Community-acquired pneumonia -Incidental finding of liver lesion on CT abdomen and pelvis need further workup. -A. fib on warfarin with supratherapeutic INR.

## 2013-10-19 NOTE — ED Notes (Addendum)
MD at bedside, aware of pt's VS

## 2013-10-19 NOTE — ED Provider Notes (Signed)
CSN: [id number]     Arrival date & time 10/19/13  1120 History   First MD Initiated Contact with Patient 10/19/13 1313     Chief Complaint  Patient presents with  . Back Pain     (Consider location/radiation/quality/duration/timing/severity/associated sxs/prior Treatment) HPI Comments: Patient reports pain of right back from right hip radiating up to right lower posterior chest wall  Patient is a 80 y.o. male presenting with flank pain. The history is provided by the patient and the spouse. No language interpreter was used.  Flank Pain This is a new problem. The current episode started 12 to 24 hours ago. The problem occurs constantly. The problem has not changed since onset.Pertinent negatives include no chest pain, no abdominal pain, no headaches and no shortness of breath. Exacerbated by: movement. Nothing relieves the symptoms. He has tried rest for the symptoms. The treatment provided no relief.    Past Medical History  Diagnosis Date  . Bursitis     OF THE RIGHT SHOULDER  . Tinnitus   . Permanent atrial fibrillation     s/p AV nodal ablation  . Depressed ejection fraction 10    EF previously 45-50%, s/p AV nodal ablation and biv pacemaker implant, previously enrolled in Block HF and was randomized to RV pacing only)  . Chronic anemia     RECIEVES ARANESP SHOTS IF HIS HEMOGLOBIN FALLS BELOW 11  . Blood transfusion     "several; Dr. Deberah Pelton" (02/06/2013)  . GERD (gastroesophageal reflux disease)   . MDS (myelodysplastic syndrome), low grade 02/10/2011  . Iron overload, transfusional 02/10/2011  . Complete heart block 12/28/06    s/p AV nodal ablation and BIV pacemaker implant at [facility] by Dr Rosita Fire  . CHF (congestive heart failure)   . Chronic bronchitis   . Pneumonia     "high school; [employer]; twice since, last time 12/ 2014" (02/06/2013)  . H/O hiatal hernia   . Arthritis     "hands, back" (02/06/2013)  . Gout   . Kidney stones 1985   Past Surgical History   Procedure Laterality Date  . Total knee arthroplasty Bilateral     right Dec.2011,left June 2012  . Joint replacement    . Pilonidal cyst excision  1970's  . Kidney stone surgery  1985    "tried to go up my penis; had to cut my stomach and go in for lodged stone"  . Av node ablation  12/08    at [facility]  . Insert / replace / remove pacemaker  12/28/2006; 04/11/2013    MDT CRTP implanted 2008 by Dr Rosita Fire at [facility]; gen change 03/2013 by Dr Lovena Le  . Bunionectomy Bilateral 11/2012- 01/2013    "left-right; great toes; gout and bunion OR" (02/06/2013)  . Inguinal hernia repair Right   . Lithotripsy  1985  . Kidney stone surgery  1986    "put a tube in my back"  . Cataract extraction w/ intraocular lens  implant, bilateral Bilateral    Family History  Problem Relation Age of Onset  . Cancer Mother   . Melanoma Mother   . COPD Father   . Kidney disease Brother    History  Substance Use Topics  . Smoking status: Former Smoker -- 1.00 packs/day for 25 years    Types: Cigarettes    Start date: 02/23/1944    Quit date: 02/10/1968  . Smokeless tobacco: Never Used     Comment: quit 44 years ago  . Alcohol Use:  No     Comment: 02/06/2013 "drank a little bit from age [age]"    Review of Systems  Constitutional: Negative for fever, activity change, appetite change and fatigue.  HENT: Negative for congestion, facial swelling, rhinorrhea and trouble swallowing.   Eyes: Negative for photophobia and pain.  Respiratory: Negative for cough, chest tightness and shortness of breath.   Cardiovascular: Negative for chest pain and leg swelling.  Gastrointestinal: Positive for nausea. Negative for vomiting, abdominal pain, diarrhea and constipation.  Endocrine: Negative for polydipsia and polyuria.  Genitourinary: Positive for flank pain. Negative for dysuria, urgency, decreased urine volume and difficulty urinating.  Musculoskeletal: Positive for back pain. Negative for gait  problem.  Skin: Negative for color change, rash and wound.  Allergic/Immunologic: Negative for immunocompromised state.  Neurological: Negative for dizziness, facial asymmetry, speech difficulty, weakness, numbness and headaches.  Psychiatric/Behavioral: Negative for confusion, decreased concentration and agitation.      Allergies  Moxifloxacin hcl in nacl; Methylprednisolone; and Ramipril  Home Medications   Prior to Admission medications   Medication Sig Start Date End Date Taking? Authorizing Provider  alfuzosin (UROXATRAL) 10 MG 24 hr tablet Take 1 tablet by mouth daily. 07/22/13  Yes Historical Provider, MD  allopurinol (ZYLOPRIM) 300 MG tablet Take 300 mg by mouth daily. 07/26/13  Yes Historical Provider, MD  carvedilol (COREG) 6.25 MG tablet Take 1 tablet (6.25 mg total) by mouth 2 (two) times daily with a meal. 10/11/13  Yes Darlin Coco, MD  famotidine (PEPCID) 20 MG tablet Take 40 mg by mouth at bedtime.    Yes Historical Provider, MD  finasteride (PROSCAR) 5 MG tablet Take 5 mg by mouth every evening. 5pm   Yes Historical Provider, MD  furosemide (LASIX) 40 MG tablet TAKE ONE TABLET BY MOUTH ONCE DAILY 08/04/13  Yes Deboraha Sprang, MD  Multiple Vitamin (MULTIVITAMIN WITH MINERALS) TABS tablet Take 1 tablet by mouth daily.   Yes Historical Provider, MD  pyridoxine (B-6) 100 MG tablet Take 100 mg by mouth 2 (two) times daily.    Yes Historical Provider, MD  warfarin (COUMADIN) 5 MG tablet TAKE ONE TABLET BY MOUTH ONCE DAILY AS DIRECTED BY  MD 08/31/13  Yes [name], MD   BP 105/53  Pulse 80  Temp(Src) 99.9 F (37.7 C) (Oral)  Resp 25  Ht 6\' 2"  (1.88 m)  Wt 210 lb (95.255 kg)  BMI 26.95 kg/m2  SpO2 96% Physical Exam  Constitutional: He is oriented to person, place, and time. He appears well-developed and well-nourished. No distress.  HENT:  Head: Normocephalic and atraumatic.  Mouth/Throat: No oropharyngeal exudate.  Eyes: Pupils are equal, round, and reactive to  light.  Neck: Normal range of motion. Neck supple.  Cardiovascular: Normal rate, regular rhythm and normal heart sounds.  Exam reveals no gallop and no friction rub.   No murmur heard. Pulmonary/Chest: Effort normal and breath sounds normal. No respiratory distress. He has no wheezes. He has no rales.  Abdominal: Soft. Bowel sounds are normal. He exhibits no distension and no mass. There is no tenderness. There is CVA tenderness (right). There is no rebound and no guarding.  Musculoskeletal: Normal range of motion. He exhibits no edema and no tenderness.  Neurological: He is alert and oriented to person, place, and time.  Skin: Skin is warm and dry.  Psychiatric: He has a normal mood and affect.    ED Course  Procedures (including critical care time) Labs Review Labs Reviewed  CBC WITH DIFFERENTIAL -  Abnormal; Notable for the following:    WBC 21.6 (*)    RBC 3.20 (*)    Hemoglobin 10.3 (*)    HCT 30.7 (*)    Platelets 124 (*)    Lymphocytes Relative 2 (*)    Monocytes Relative 18 (*)    Neutro Abs 17.3 (*)    Lymphs Abs 0.4 (*)    Monocytes Absolute 3.9 (*)    All other components within normal limits  COMPREHENSIVE METABOLIC PANEL - Abnormal; Notable for the following:    Sodium 135 (*)    Glucose, Bld 107 (*)    BUN 32 (*)    Creatinine, Ser 2.00 (*)    Albumin 3.4 (*)    Total Bilirubin 1.6 (*)    GFR calc non Af Amer 29 (*)    GFR calc Af Amer 34 (*)    All other components within normal limits  PROTIME-INR - Abnormal; Notable for the following:    Prothrombin Time 39.5 (*)    INR 4.07 (*)    All other components within normal limits  CULTURE, BLOOD (ROUTINE X 2)  CULTURE, BLOOD (ROUTINE X 2)  URINALYSIS, ROUTINE W REFLEX MICROSCOPIC  I-STAT CG4 LACTIC ACID, ED    Imaging Review Dg Chest 2 View  10/19/2013   CLINICAL DATA:  One day history of chest pain with cough and fever  EXAM: CHEST  2 VIEW  COMPARISON:  June 23, 2010  FINDINGS: There is bibasilar lung  scarring. There is no appreciable edema or consolidation. Heart is upper normal in size with pulmonary vascularity within normal limits. No adenopathy. Pacemaker leads positions are stable compared to prior study. There is degenerative change in the thoracic spine.  IMPRESSION: Bibasilar scarring. No edema or consolidation. No change in cardiac silhouette.   Electronically Signed   By: Lowella Grip M.D.   On: 10/19/2013 12:20   Ct Chest Wo Contrast  10/19/2013   CLINICAL DATA:  Fever, right posterior back pain.  EXAM: CT CHEST WITHOUT CONTRAST  TECHNIQUE: Multidetector CT imaging of the chest was performed following the standard protocol without IV contrast.  COMPARISON:  Radiograph of same day.  FINDINGS: No pneumothorax is noted. Mild right pleural effusion is noted. Mild airspace opacity is noted medially in the posterior portion the right lower lobe consistent with subsegmental atelectasis or possibly pneumonia. Mild cardiomegaly is noted. Left-sided pacemaker is noted with leads in grossly good position. Anterior osteophyte formation is seen in the lower thoracic spine. No mediastinal mass or adenopathy is noted. Coronary artery calcifications are noted. 4.4 x 3.2 cm low density is noted in the superior portion of the liver with Hounsfield measurement of 32. Further evaluation with MRI is recommended.  IMPRESSION: Mild right pleural effusion.  Airspace opacity is seen medially in posterior portion of right lower lobe consistent with pneumonia or subsegmental atelectasis.  4.4 cm low density is noted in the superior portion the liver which is not diagnostic for cyst and concerning for possible neoplasm. Further evaluation with MRI is recommended.   Electronically Signed   By: Sabino Dick M.D.   On: 10/19/2013 15:19   Ct Renal Stone Study  10/19/2013   CLINICAL DATA:  Right flank pain beginning last night.  EXAM: CT ABDOMEN AND PELVIS WITHOUT CONTRAST  TECHNIQUE: Multidetector CT imaging of the  abdomen and pelvis was performed following the standard protocol without IV contrast.  COMPARISON:  None.  FINDINGS: The patient has a small right pleural effusion but there  is no left pleural effusions. Small pericardial effusion is identified. There is cardiomegaly.  The adrenal glands, spleen, pancreas and biliary tree are unremarkable. The gallbladder appears normal. There is a hypo attenuating lesion in the left hepatic lobe measuring 2.7 x 3.7 cm on image 19. No lymphadenopathy or fluid is identified. Colonic diverticulosis is worst in the sigmoid but there is no diverticulitis. The colon is otherwise unremarkable. The stomach and small bowel appear normal. Extensive aortoiliac atherosclerosis without aneurysm is seen. No lytic or sclerotic bony lesion is identified.  IMPRESSION: Nonspecific hypo attenuating lesion left hepatic lobe could be a benign entity such as a hemangioma but may represent primary or metastatic neoplasm. MRI of the liver without without contrast is recommended for further evaluation.  Small right pleural effusion and small pericardial effusion.  Nonobstructing stone lower pole left kidney.  Diverticulosis without diverticulitis.   Electronically Signed   By: Inge Rise M.D.   On: 10/19/2013 14:19     EKG Interpretation None      MDM   Final diagnoses:  CAP (community acquired pneumonia)    Pt is a 80 y.o. male with Pmhx as above who presents with R sided pain from R hip to R posterior chest wall. Since about 10 pt last night, patient reports that the pain is constant however during interview he has intermittent episodes of grimacing and appears to be intermittently in severe pain. He denies fever, chills, dysuria, hematuria, diarrhea, constipation, black or bloody stools. He reports he has had a mild cough and was put on Keflex and prednisone several days ago for bronchitis. He has had a history of kidney stones in the past. No h/a, neck pain/stiffness  On physical  exam vitals are stable, temperature of 100 oral. He has no anterior abdominal pain. He has left CVA tenderness. He does not appear to have bony tenderness of the right hip. Chest x-ray is unremarkable. Urine is unremarkable. Clinically I have concern for kidney stone a CT stone study was ordered as well as CBC, BMP E., PT/INR.   Rectal temp 102.1 WBC 21.6, INR 4.07, CMP with Na 135, Cr 2 which is near baseline. CT ab/pelvis w/o shows nonspecific hypo attenuating  L hepatic lobe lesion with concern for hemangioma or metastatic neoplasm (rec MRI), small R pleural effusion, small pericardial effusion. Spoke w/ radiology who feel liver abscess unlikely, but cannot be fully ruled out. Will add CT chest w/o contrast. Pt feeling somewhat better.   3:34 PM CT chest with RLL pneumonia vs subsegmental atelectasis. Will start rocephin/azithro for presumed CAP. Blood cultures ordered. [facility] consulted [facility] (preferred location).   4:19 PM Pt will be admitted to [facility] at [facility].    Ernestina Patches, MD 10/19/13 1620

## 2013-10-20 LAB — STREP PNEUMONIAE URINARY ANTIGEN: STREP PNEUMO URINARY ANTIGEN: NEGATIVE

## 2013-10-20 LAB — COMPREHENSIVE METABOLIC PANEL
ALT: 29 U/L (ref 0–53)
AST: 23 U/L (ref 0–37)
Albumin: 2.9 g/dL — ABNORMAL LOW (ref 3.5–5.2)
Alkaline Phosphatase: 72 U/L (ref 39–117)
Anion gap: 11 (ref 5–15)
BUN: 31 mg/dL — ABNORMAL HIGH (ref 6–23)
CALCIUM: 8.3 mg/dL — AB (ref 8.4–10.5)
CO2: 23 meq/L (ref 19–32)
CREATININE: 1.89 mg/dL — AB (ref 0.50–1.35)
Chloride: 97 mEq/L (ref 96–112)
GFR, EST AFRICAN AMERICAN: 36 mL/min — AB (ref >90)
GFR, EST NON AFRICAN AMERICAN: 31 mL/min — AB (ref >90)
GLUCOSE: 126 mg/dL — AB (ref 70–99)
Potassium: 4.3 mEq/L (ref 3.7–5.3)
Sodium: 131 mEq/L — ABNORMAL LOW (ref 137–147)
Total Bilirubin: 1.3 mg/dL — ABNORMAL HIGH (ref 0.3–1.2)
Total Protein: 6.2 g/dL (ref 6.0–8.3)

## 2013-10-20 LAB — CBC WITH DIFFERENTIAL/PLATELET
Basophils Absolute: 0 10*3/uL (ref 0.0–0.1)
Basophils Relative: 0 % (ref 0–1)
EOS ABS: 0 10*3/uL (ref 0.0–0.7)
Eosinophils Relative: 0 % (ref 0–5)
HCT: 28.1 % — ABNORMAL LOW (ref 39.0–52.0)
Hemoglobin: 9.3 g/dL — ABNORMAL LOW (ref 13.0–17.0)
LYMPHS PCT: 5 % — AB (ref 12–46)
Lymphs Abs: 1.1 10*3/uL (ref 0.7–4.0)
MCH: 31.6 pg (ref 26.0–34.0)
MCHC: 33.1 g/dL (ref 30.0–36.0)
MCV: 95.6 fL (ref 78.0–100.0)
Monocytes Absolute: 6.2 10*3/uL — ABNORMAL HIGH (ref 0.1–1.0)
Monocytes Relative: 29 % — ABNORMAL HIGH (ref 3–12)
NEUTROS PCT: 66 % (ref 43–77)
Neutro Abs: 14 10*3/uL — ABNORMAL HIGH (ref 1.7–7.7)
PLATELETS: 114 10*3/uL — AB (ref 150–400)
RBC: 2.94 MIL/uL — ABNORMAL LOW (ref 4.22–5.81)
RDW: 15 % (ref 11.5–15.5)
WBC: 21.3 10*3/uL — AB (ref 4.0–10.5)

## 2013-10-20 LAB — PROTIME-INR
INR: 3.98 — AB (ref 0.00–1.49)
PROTHROMBIN TIME: 39.1 s — AB (ref 11.6–15.2)

## 2013-10-20 LAB — TROPONIN I: Troponin I: <0.3 ng/mL (ref <0.30)

## 2013-10-20 MED ORDER — WARFARIN - PHARMACIST DOSING INPATIENT: Freq: Every day | Status: DC

## 2013-10-20 MED ORDER — INFLUENZA VAC SPLIT QUAD 0.5 ML IM SUSY
0.5000 mL | PREFILLED_SYRINGE | INTRAMUSCULAR | Status: AC
  Administered 2013-10-22: 0.5 mL via INTRAMUSCULAR
  Filled 2013-10-20 (×3): qty 0.5

## 2013-10-20 NOTE — Progress Notes (Signed)
PROGRESS NOTE  [name] WJX:[id number] DOB: Jan 02, 1928 DOA: 10/19/2013 PCP: Thurman Coyer, MD  Assessment/Plan: Sepsis  -Present at the time of admission with leukocytosis fever  -Secondary to pneumonia  -The patient has received 1.5 L of normal saline  -Appears to have failed outpatient therapy (cephalexin) -Follow blood cultures--neg to date  -Leukocytosis is probably attributable to recent prednisone in the outpatient setting  community acquired pneumonia  -Continue azithromycin and ceftriaxone that was started in the emergency department  -Urine Legionella antigen--pending  -urine Streptococcus pneumoniae antigen--neg  -Although chest x-ray did not reveal gross consolidation, CT of the chest revealed a posterior right lower lobe opacity  -still with fever,but trending down Right chest pain/right flank pain  -May be related to RLL PNA  -antitussive  -cycle troponins--neg  Hypodensity left lobe liver/Liver mass  -Unfortunately MRI cannot be obtained due to the patient PPM  -Cannot give intravenous contrast at this time due to the patient's CKD  -spoke with radiology, Dr. Marcello Moores Dalessio--felt this was more a mass lesion and only diagnostic option left= biopsy -will need to speak with IR to ascertain feasibility of bx-->if feasible, will reverse coumadin and start IV heparin in preparation for bx on 10/23/13 -LFTs are normal  Permanent atrial fibrillation  -Continue warfarin for now -Mildly supratherapeutic INR likely due to recent antibiotics  Cardiomyopathy  -Echocardiogram 02/06/2013 showed EF 25-30%  -Presently euvolemic  Complete heart block  -s/p PPM  CKD stage III  -baseline creatinine 1.6-1.8  -pt already given 1.5L NS, will not give any additional as pt taking good po  Family Communication:   Pt at beside Disposition Plan:   Home when medically stable    Antibiotics:  Ceftriaxone 10/19/13>>>  Azithromycin  10/19/13>>>    Procedures/Studies: Dg Chest 2 View  10/19/2013   CLINICAL DATA:  One day history of chest pain with cough and fever  EXAM: CHEST  2 VIEW  COMPARISON:  June 23, 2010  FINDINGS: There is bibasilar lung scarring. There is no appreciable edema or consolidation. Heart is upper normal in size with pulmonary vascularity within normal limits. No adenopathy. Pacemaker leads positions are stable compared to prior study. There is degenerative change in the thoracic spine.  IMPRESSION: Bibasilar scarring. No edema or consolidation. No change in cardiac silhouette.   Electronically Signed   By: Lowella Grip M.D.   On: 10/19/2013 12:20   Ct Chest Wo Contrast  10/19/2013   CLINICAL DATA:  Fever, right posterior back pain.  EXAM: CT CHEST WITHOUT CONTRAST  TECHNIQUE: Multidetector CT imaging of the chest was performed following the standard protocol without IV contrast.  COMPARISON:  Radiograph of same day.  FINDINGS: No pneumothorax is noted. Mild right pleural effusion is noted. Mild airspace opacity is noted medially in the posterior portion the right lower lobe consistent with subsegmental atelectasis or possibly pneumonia. Mild cardiomegaly is noted. Left-sided pacemaker is noted with leads in grossly good position. Anterior osteophyte formation is seen in the lower thoracic spine. No mediastinal mass or adenopathy is noted. Coronary artery calcifications are noted. 4.4 x 3.2 cm low density is noted in the superior portion of the liver with Hounsfield measurement of 32. Further evaluation with MRI is recommended.  IMPRESSION: Mild right pleural effusion.  Airspace opacity is seen medially in posterior portion of right lower lobe consistent with pneumonia or subsegmental atelectasis.  4.4 cm low density is noted in the superior portion the liver which  is not diagnostic for cyst and concerning for possible neoplasm. Further evaluation with MRI is recommended.   Electronically Signed   By: Sabino Dick  M.D.   On: 10/19/2013 15:19   Dg Foot 2 Views Right  10/10/2013   View right indicates plate across the first metatarsal secondary to fusion  of the first metatarsal hallux with and he long gaited second toe and  pressure occurring with swelling of the interphalangeal joint but no  indication of stress fracture  Ct Renal Stone Study  10/19/2013   CLINICAL DATA:  Right flank pain beginning last night.  EXAM: CT ABDOMEN AND PELVIS WITHOUT CONTRAST  TECHNIQUE: Multidetector CT imaging of the abdomen and pelvis was performed following the standard protocol without IV contrast.  COMPARISON:  None.  FINDINGS: The patient has a small right pleural effusion but there is no left pleural effusions. Small pericardial effusion is identified. There is cardiomegaly.  The adrenal glands, spleen, pancreas and biliary tree are unremarkable. The gallbladder appears normal. There is a hypo attenuating lesion in the left hepatic lobe measuring 2.7 x 3.7 cm on image 19. No lymphadenopathy or fluid is identified. Colonic diverticulosis is worst in the sigmoid but there is no diverticulitis. The colon is otherwise unremarkable. The stomach and small bowel appear normal. Extensive aortoiliac atherosclerosis without aneurysm is seen. No lytic or sclerotic bony lesion is identified.  IMPRESSION: Nonspecific hypo attenuating lesion left hepatic lobe could be a benign entity such as a hemangioma but may represent primary or metastatic neoplasm. MRI of the liver without without contrast is recommended for further evaluation.  Small right pleural effusion and small pericardial effusion.  Nonobstructing stone lower pole left kidney.  Diverticulosis without diverticulitis.   Electronically Signed   By: Inge Rise M.D.   On: 10/19/2013 14:19         Subjective: Patient continued to complain of right-sided pleuritic pain. Denies any headache, visual disturbance, shortness breath, nausea, vomiting, diarrhea, abdominal pain,  dysuria, hematochezia, melena. The patient has small amount of hemoptysis today.  Objective: Filed Vitals:   10/19/13 1830 10/19/13 2101 10/20/13 0435 10/20/13 1429  BP: 103/60 124/58 115/51 106/60  Pulse: 79 80 78 91  Temp: 98.3 F (36.8 C) 99.4 F (37.4 C) 99.6 F (37.6 C) 101.2 F (38.4 C)  TempSrc: Oral Oral Oral Oral  Resp: 18 24 22 22   Height: 6\' 2"  (1.88 m)     Weight: 96.1 kg (211 lb 13.8 oz)  97.2 kg (214 lb 4.6 oz)   SpO2: 97% 94% 94% 95%    Intake/Output Summary (Last 24 hours) at 10/20/13 1742 Last data filed at 10/20/13 1429  Gross per 24 hour  Intake   1595 ml  Output   1025 ml  Net    570 ml   Weight change:  Exam:   General:  Pt is alert, follows commands appropriately, not in acute distress  HEENT: No icterus, No thrush,  [place]/AT  Cardiovascular: RRR, S1/S2, no rubs, no gallops  Respiratory: CTA bilaterally, no wheezing, no crackles, no rhonchi; R-chest wall tender to palpation  Abdomen: Soft/+BS, non tender, non distended, no guarding  Extremities: No edema, No lymphangitis, No petechiae, No rashes, no synovitis-no rash on chest wall or abdomen  Data Reviewed: Basic Metabolic Panel:  Recent Labs Lab 10/19/13 1345 10/20/13 0240  NA 135* 131*  K 4.2 4.3  CL 98 97  CO2 24 23  GLUCOSE 107* 126*  BUN 32* 31*  CREATININE 2.00* 1.89*  CALCIUM 9.0 8.3*   Liver Function Tests:  Recent Labs Lab 10/19/13 1345 10/20/13 0240  AST 28 23  ALT 33 29  ALKPHOS 76 72  BILITOT 1.6* 1.3*  PROT 7.0 6.2  ALBUMIN 3.4* 2.9*   No results found for this basename: LIPASE, AMYLASE,  in the last 168 hours No results found for this basename: AMMONIA,  in the last 168 hours CBC:  Recent Labs Lab 10/19/13 1345 10/20/13 0240  WBC 21.6* 21.3*  NEUTROABS 17.3* 14.0*  HGB 10.3* 9.3*  HCT 30.7* 28.1*  MCV 95.9 95.6  PLT 124* 114*   Cardiac Enzymes:  Recent Labs Lab 10/19/13 2050 10/20/13 0240 10/20/13 0810  TROPONINI <0.30 <0.30 <0.30    BNP: No components found with this basename: POCBNP,  CBG: No results found for this basename: GLUCAP,  in the last 168 hours  Recent Results (from the past 240 hour(s))  CULTURE, BLOOD (ROUTINE X 2)     Status: None   Collection Time    10/19/13  3:00 PM      Result Value Ref Range Status   Specimen Description BLOOD RIGHT [facility]   Final   Special Requests     Final   Value: Normal BOTTLES DRAWN AEROBIC AND ANAEROBIC 5CC EACH   Culture  Setup Time     Final   Value: 10/19/2013 20:30     Performed at [facility]   Culture     Final   Value:        BLOOD CULTURE RECEIVED NO GROWTH TO DATE CULTURE WILL BE HELD FOR 5 DAYS BEFORE ISSUING A FINAL NEGATIVE REPORT     Performed at [facility]   Report Status PENDING   Incomplete  CULTURE, BLOOD (ROUTINE X 2)     Status: None   Collection Time    10/19/13  3:15 PM      Result Value Ref Range Status   Specimen Description BLOOD RIGHT HAND   Final   Special Requests     Final   Value: Normal BOTTLES DRAWN AEROBIC AND ANAEROBIC 5CC EACH   Culture  Setup Time     Final   Value: 10/19/2013 20:29     Performed at [facility]   Culture     Final   Value:        BLOOD CULTURE RECEIVED NO GROWTH TO DATE CULTURE WILL BE HELD FOR 5 DAYS BEFORE ISSUING A FINAL NEGATIVE REPORT     Performed at [facility]   Report Status PENDING   Incomplete     Scheduled Meds: . alfuzosin  10 mg Oral Daily  . allopurinol  300 mg Oral Daily  . azithromycin  500 mg Oral Daily  . cefTRIAXone (ROCEPHIN)  IV  1 g Intravenous Q24H  . famotidine  20 mg Oral QHS  . finasteride  5 mg Oral QPM  . [START ON 10/21/2013] Influenza vac split quadrivalent PF  0.5 mL Intramuscular Tomorrow-1000  . multivitamin with minerals  1 tablet Oral Daily  . pyridoxine  100 mg Oral BID  . sodium chloride  3 mL Intravenous Q12H  . Warfarin - Pharmacist Dosing Inpatient   Does not apply q1800   Continuous Infusions:    Kalijah Zeiss, DO  [facility] Pager [phone]  If 7PM-7AM, please contact night-coverage [email or web address] Password [email or web address] 10/20/2013, 5:42 PM   LOS: 1 day

## 2013-10-20 NOTE — Care Management Note (Addendum)
    Page 1 of 1   10/26/2013     11:31:11 AM CARE MANAGEMENT NOTE 10/26/2013  Patient:  [name], [name]   Account Number:  [id number]  Date Initiated:  10/20/2013  Documentation initiated by:  [facility]  Subjective/Objective Assessment:   80 Y/O M ADMITTED W/PNA.     Action/Plan:   FROM HOME W/SPOUSE.   Anticipated DC Date:  10/26/2013   Anticipated DC Plan:  [place]  CM consult      Choice offered to / List presented to:             Status of service:  Completed, signed off Medicare Important Message given?  YES (If response is "NO", the following Medicare IM given date fields will be blank) Date Medicare IM given:  10/23/2013 Medicare IM given by:  [facility] Date Additional Medicare IM given:   Additional Medicare IM given by:    Discharge Disposition:    Per UR Regulation:  Reviewed for med. necessity/level of care/duration of stay  If discussed at [place] of Stay Meetings, dates discussed:   10/24/2013  10/26/2013    Comments:  10/26/13 Chenika Nevils RN,BSN NCM [phone] D/C HOME NO NEEDS OR ORDERS.  10/24/13 Chiquita Heckert RN,BSN NCM [phone] INR 1.82-GOAL 1.7.PCCM FOLLOWING FOR ?THORACENTESIS.  10/23/13 Lainie Daubert RN,BSN NCM [phone] PCCM-?PARA PNEUMONIC EFFUSION, INR-2.28 GOAL LESS 1.7,AWAIT INR @ GOAL PRIOR THORACENTESIS, LABS. IV ABX. D/C PLAN HOME, NO ANTICIPATED D/C NEEDS.  10/20/13 Chynah Orihuela RN,BSN NCM [phone] WOULD RECOMMEND PT CONS.

## 2013-10-20 NOTE — Progress Notes (Signed)
[name] for Warfarin Indication: atrial fibrillation  Allergies  Allergen Reactions  . Moxifloxacin Hcl In Nacl Other (See Comments) and Itching    Pt c/o being "out of his head" & itching  . Methylprednisolone Other (See Comments)    Dizziness, passed out twice  . Ramipril Other (See Comments)    Renal function worsened on ACEi.    Patient Measurements: Height: 6\' 2"  (188 cm) Weight: 214 lb 4.6 oz (97.2 kg) IBW/kg (Calculated) : 82.2  Vital Signs: Temp: 99.6 F (37.6 C) (10/02 0435) Temp Source: Oral (10/02 0435) BP: 115/51 mmHg (10/02 0435) Pulse Rate: 78 (10/02 0435)  Labs:  Recent Labs  10/19/13 1345 10/19/13 2050 10/20/13 0240 10/20/13 0810  HGB 10.3*  --  9.3*  --   HCT 30.7*  --  28.1*  --   PLT 124*  --  114*  --   LABPROT 39.5*  --   --  39.1*  INR 4.07*  --   --  3.98*  CREATININE 2.00*  --  1.89*  --   TROPONINI  --  <0.30 <0.30 <0.30    Estimated Creatinine Clearance: 33.8 ml/min (by C-G formula based on Cr of 1.89).  Assessment: 80 yo male with MDS admitted 10/1 with flank pain. On chronic warfarin for atrial fibrillation. Pharmacy is consulted to continue dosing on admission.  INR  supratherapeutic (4.07) at admission, likely from recent antibiotic use for bronchitis.   Home dose reported as 5mg  daily - last taken 9/30  Today 10/2:  INR = 3.98  CBC low - decreased to 9.3 (? Related to IVF/dilution), pltc low but consistent with previous results given MDS  Diet: Heart Healthy  No bleeding reported  Concurrent antibiotics for pneumonia may increase warfarin sensitivity  Goal of Therapy:  INR 2-3   Plan:   No warfarin today d/t surpatherapeutic INR  Daily PT/INR  Doreene Eland, PharmD, BCPS.   Pager: [phone] 10/20/2013,10:28 AM

## 2013-10-21 LAB — PROTIME-INR
INR: 2.95 — AB (ref 0.00–1.49)
Prothrombin Time: 30.9 seconds — ABNORMAL HIGH (ref 11.6–15.2)

## 2013-10-21 LAB — COMPREHENSIVE METABOLIC PANEL
ALT: 24 U/L (ref 0–53)
AST: 17 U/L (ref 0–37)
Albumin: 2.7 g/dL — ABNORMAL LOW (ref 3.5–5.2)
Alkaline Phosphatase: 80 U/L (ref 39–117)
Anion gap: 11 (ref 5–15)
BUN: 36 mg/dL — ABNORMAL HIGH (ref 6–23)
CHLORIDE: 101 meq/L (ref 96–112)
CO2: 24 meq/L (ref 19–32)
Calcium: 8.8 mg/dL (ref 8.4–10.5)
Creatinine, Ser: 1.97 mg/dL — ABNORMAL HIGH (ref 0.50–1.35)
GFR, EST AFRICAN AMERICAN: 34 mL/min — AB (ref >90)
GFR, EST NON AFRICAN AMERICAN: 29 mL/min — AB (ref >90)
GLUCOSE: 117 mg/dL — AB (ref 70–99)
POTASSIUM: 3.8 meq/L (ref 3.7–5.3)
SODIUM: 136 meq/L — AB (ref 137–147)
Total Bilirubin: 1.1 mg/dL (ref 0.3–1.2)
Total Protein: 6.1 g/dL (ref 6.0–8.3)

## 2013-10-21 LAB — CBC
HCT: 27.3 % — ABNORMAL LOW (ref 39.0–52.0)
HEMOGLOBIN: 9.3 g/dL — AB (ref 13.0–17.0)
MCH: 32.1 pg (ref 26.0–34.0)
MCHC: 34.1 g/dL (ref 30.0–36.0)
MCV: 94.1 fL (ref 78.0–100.0)
Platelets: 130 10*3/uL — ABNORMAL LOW (ref 150–400)
RBC: 2.9 MIL/uL — AB (ref 4.22–5.81)
RDW: 15 % (ref 11.5–15.5)
WBC: 14 10*3/uL — AB (ref 4.0–10.5)

## 2013-10-21 MED ORDER — WARFARIN SODIUM 2.5 MG PO TABS
2.5000 mg | ORAL_TABLET | Freq: Once | ORAL | Status: AC
  Administered 2013-10-21: 2.5 mg via ORAL
  Filled 2013-10-21: qty 1

## 2013-10-21 MED ORDER — ACETYLCYSTEINE 20 % IN SOLN
1200.0000 mg | Freq: Two times a day (BID) | RESPIRATORY_TRACT | Status: AC
  Administered 2013-10-21 – 2013-10-22 (×4): 1200 mg via ORAL
  Filled 2013-10-21 (×5): qty 8

## 2013-10-21 MED ORDER — POTASSIUM CHLORIDE 2 MEQ/ML IV SOLN
INTRAVENOUS | Status: DC
  Administered 2013-10-21 – 2013-10-22 (×3): via INTRAVENOUS
  Filled 2013-10-21 (×4): qty 1000

## 2013-10-21 NOTE — Progress Notes (Addendum)
PROGRESS NOTE  [name] ZDG:[id number] DOB: 1927-12-21 DOA: 10/19/2013 PCP: Thurman Coyer, MD  Brief history 80 year old male with a history of MDS-- refractory anemia (follows [facility]) on Aranesp, third-degree heart block s/p PPM, cardiomyopathy with EF 25-30%, permanent atrial fibrillation, and COPD presents with one-day history of right flank pain extending from the patient's right-sided chest to right upper abdomen which began approximately 12-24 hours prior to admission. The patient denies any recent injury or trauma. Incidentally, the patient saw his primary care provider approximately 2 days prior to this admission. There was concern for bronchitis and the patient was started on cephalexin and prednisone. The patient did not clinically improve and continued to have fevers. CT of the chest at time of admission noted an airspace opacity in the posterior portion of the right lower lobe. The patient was treated for pneumonia, but he continued to have fever. CT of the abdomen with renal stone protocol revealed a nonobstructing stone in the left kidney and small right pleural effusion. Both previous CTs revealed a left hepatic lobe hypoattenuating lesion. Unfortunately, MRI could not be performed to clarify the hepatic lesion  due to the patient's PPM. After discussion with interventional radiology it was determined that there were no other noninvasive radiographic studies which were clarify the etiology of the patient's liver mass. After Discussion with the patient's family and nephrology, it was agreed to pursue CT of the Liver with and without contrast.  Assessment/Plan: Sepsis  -Present at the time of admission with leukocytosis fever  -Secondary to pneumonia  -The patient has received 1.5 L of normal saline  -Appears to have failed outpatient therapy (cephalexin)  -Follow blood cultures--neg to date  -Leukocytosis is probably attributable to recent prednisone in the outpatient  setting  community acquired pneumonia  -Continue azithromycin and ceftriaxone that was started in the emergency department  -Urine Legionella antigen--pending  -urine Streptococcus pneumoniae antigen--neg  -Although chest x-ray did not reveal gross consolidation, CT of the chest revealed a posterior right lower lobe opacity  -still with fever,but trending down  Persistent Fever/Liver Mass -Concern is for neoplasm versus abscess versus hemangioma -Patient cannot have MRI secondary to PPM -I have discussed with radiology, Dr. Vernard Gambles who has stated that there are no other noninvasive radiographic studies which will help clarify the etiology of the patient's liver mass -CT of the liver with and without contrast were clarify etiology and therefore avoid biopsy of the liver if this was consistent with an hemangioma -Pursuing liver biopsy at this time without further clarification with that the patient had significant bleeding risk if this was a hemangioma -Unfortunately, there are no other radiographic other diagnostic modalities which will help clarify the patient's liver mass at this time -I have discussed extensively with the patient and his family the risks involved with CT of the abdomen/liver with intravenous contrast including but not limited to progressive renal failure--they have expressed understanding and wish to pursue CT of Liver  -As discussed, there are no other diagnostic modalities to help clarify the patient's liver mass  Right chest pain/right flank pain  -May be related to RLL PNA  -antitussive  -cycle troponins--neg  Hypodensity left lobe liver/Liver mass  -Unfortunately MRI cannot be obtained due to the patient PPM   -spoke with radiology, Dr. Marcello Moores Dalessio--felt this was more a mass lesion and only diagnostic option left= biopsy  -will need to speak with IR to ascertain feasibility of bx-->if feasible, will  reverse coumadin and start IV heparin in preparation for bx on  10/23/13  -LFTs are normal  Permanent atrial fibrillation  -Continue warfarin for now  -Mildly supratherapeutic INR likely due to recent antibiotics  Cardiomyopathy  -Echocardiogram 02/06/2013 showed EF 25-30%  -Presently euvolemic  Complete heart block  -s/p PPM  CKD stage III  -baseline creatinine 1.6-1.8  -pt already given 1.5L NS, will not give any additional as pt taking good po -Start gentle hydration in preparation for the patient's contrasted CT -Will avoid any nephrotoxic agents and hemodynamic instability  -start mucomyst Family Communication: Family updated at bedside, total time 32min, >50% spent counseling and coodinating care Disposition Plan: Home when medically stable   Antibiotics:  Ceftriaxone 10/19/13>>>  Azithromycin 10/19/13>>>       Procedures/Studies: Dg Chest 2 View  10/19/2013   CLINICAL DATA:  One day history of chest pain with cough and fever  EXAM: CHEST  2 VIEW  COMPARISON:  June 23, 2010  FINDINGS: There is bibasilar lung scarring. There is no appreciable edema or consolidation. Heart is upper normal in size with pulmonary vascularity within normal limits. No adenopathy. Pacemaker leads positions are stable compared to prior study. There is degenerative change in the thoracic spine.  IMPRESSION: Bibasilar scarring. No edema or consolidation. No change in cardiac silhouette.   Electronically Signed   By: Lowella Grip M.D.   On: 10/19/2013 12:20   Ct Chest Wo Contrast  10/19/2013   CLINICAL DATA:  Fever, right posterior back pain.  EXAM: CT CHEST WITHOUT CONTRAST  TECHNIQUE: Multidetector CT imaging of the chest was performed following the standard protocol without IV contrast.  COMPARISON:  Radiograph of same day.  FINDINGS: No pneumothorax is noted. Mild right pleural effusion is noted. Mild airspace opacity is noted medially in the posterior portion the right lower lobe consistent with subsegmental atelectasis or possibly pneumonia. Mild cardiomegaly  is noted. Left-sided pacemaker is noted with leads in grossly good position. Anterior osteophyte formation is seen in the lower thoracic spine. No mediastinal mass or adenopathy is noted. Coronary artery calcifications are noted. 4.4 x 3.2 cm low density is noted in the superior portion of the liver with Hounsfield measurement of 32. Further evaluation with MRI is recommended.  IMPRESSION: Mild right pleural effusion.  Airspace opacity is seen medially in posterior portion of right lower lobe consistent with pneumonia or subsegmental atelectasis.  4.4 cm low density is noted in the superior portion the liver which is not diagnostic for cyst and concerning for possible neoplasm. Further evaluation with MRI is recommended.   Electronically Signed   By: Sabino Dick M.D.   On: 10/19/2013 15:19   Dg Foot 2 Views Right  10/10/2013   View right indicates plate across the first metatarsal secondary to fusion  of the first metatarsal hallux with and he long gaited second toe and  pressure occurring with swelling of the interphalangeal joint but no  indication of stress fracture  Ct Renal Stone Study  10/19/2013   CLINICAL DATA:  Right flank pain beginning last night.  EXAM: CT ABDOMEN AND PELVIS WITHOUT CONTRAST  TECHNIQUE: Multidetector CT imaging of the abdomen and pelvis was performed following the standard protocol without IV contrast.  COMPARISON:  None.  FINDINGS: The patient has a small right pleural effusion but there is no left pleural effusions. Small pericardial effusion is identified. There is cardiomegaly.  The adrenal glands, spleen, pancreas and biliary tree are unremarkable. The gallbladder appears normal. There is  a hypo attenuating lesion in the left hepatic lobe measuring 2.7 x 3.7 cm on image 19. No lymphadenopathy or fluid is identified. Colonic diverticulosis is worst in the sigmoid but there is no diverticulitis. The colon is otherwise unremarkable. The stomach and small bowel appear normal.  Extensive aortoiliac atherosclerosis without aneurysm is seen. No lytic or sclerotic bony lesion is identified.  IMPRESSION: Nonspecific hypo attenuating lesion left hepatic lobe could be a benign entity such as a hemangioma but may represent primary or metastatic neoplasm. MRI of the liver without without contrast is recommended for further evaluation.  Small right pleural effusion and small pericardial effusion.  Nonobstructing stone lower pole left kidney.  Diverticulosis without diverticulitis.   Electronically Signed   By: Inge Rise M.D.   On: 10/19/2013 14:19         Subjective: Patient complains of a scant amount of hemoptysis. He denies any shortness breath, nausea, vomiting, diarrhea, vomiting, dysuria, hematuria. He complains of right-sided chest pain in the lower right ribs with coughing or deep inspiration   Objective: Filed Vitals:   10/20/13 2034 10/21/13 0205 10/21/13 0310 10/21/13 0527  BP: 106/55   103/55  Pulse: 81   81  Temp: 98.3 F (36.8 C) 101.6 F (38.7 C) 99.2 F (37.3 C) 98.8 F (37.1 C)  TempSrc: Oral Oral Oral Oral  Resp: 18   18  Height:      Weight:    97.3 kg (214 lb 8.1 oz)  SpO2: 95%   94%    Intake/Output Summary (Last 24 hours) at 10/21/13 0942 Last data filed at 10/21/13 0900  Gross per 24 hour  Intake   1213 ml  Output   1500 ml  Net   -287 ml   Weight change: 2.045 kg (4 lb 8.1 oz) Exam:   General:  Pt is alert, follows commands appropriately, not in acute distress  HEENT: No icterus, No thrush, [place]/AT  Cardiovascular: IRRR, S1/S2, no rubs, no gallops  Respiratory: Right lobe diminished breath sounds.  Abdomen: Soft/+BS, non tender, non distended, no guarding  Extremities: trace LE edema, No lymphangitis, No petechiae, No rashes, no synovitis  Data Reviewed: Basic Metabolic Panel:  Recent Labs Lab 10/19/13 1345 10/20/13 0240 10/21/13 0415  NA 135* 131* 136*  K 4.2 4.3 3.8  CL 98 97 101  CO2 24 23 24   GLUCOSE  107* 126* 117*  BUN 32* 31* 36*  CREATININE 2.00* 1.89* 1.97*  CALCIUM 9.0 8.3* 8.8   Liver Function Tests:  Recent Labs Lab 10/19/13 1345 10/20/13 0240 10/21/13 0415  AST 28 23 17   ALT 33 29 24  ALKPHOS 76 72 80  BILITOT 1.6* 1.3* 1.1  PROT 7.0 6.2 6.1  ALBUMIN 3.4* 2.9* 2.7*   No results found for this basename: LIPASE, AMYLASE,  in the last 168 hours No results found for this basename: AMMONIA,  in the last 168 hours CBC:  Recent Labs Lab 10/19/13 1345 10/20/13 0240 10/21/13 0415  WBC 21.6* 21.3* 14.0*  NEUTROABS 17.3* 14.0*  --   HGB 10.3* 9.3* 9.3*  HCT 30.7* 28.1* 27.3*  MCV 95.9 95.6 94.1  PLT 124* 114* 130*   Cardiac Enzymes:  Recent Labs Lab 10/19/13 2050 10/20/13 0240 10/20/13 0810  TROPONINI <0.30 <0.30 <0.30   BNP: No components found with this basename: POCBNP,  CBG: No results found for this basename: GLUCAP,  in the last 168 hours  Recent Results (from the past 240 hour(s))  CULTURE, BLOOD (ROUTINE X  2)     Status: None   Collection Time    10/19/13  3:00 PM      Result Value Ref Range Status   Specimen Description BLOOD RIGHT [facility]   Final   Special Requests     Final   Value: Normal BOTTLES DRAWN AEROBIC AND ANAEROBIC 5CC EACH   Culture  Setup Time     Final   Value: 10/19/2013 20:30     Performed at [facility]   Culture     Final   Value:        BLOOD CULTURE RECEIVED NO GROWTH TO DATE CULTURE WILL BE HELD FOR 5 DAYS BEFORE ISSUING A FINAL NEGATIVE REPORT     Performed at [facility]   Report Status PENDING   Incomplete  CULTURE, BLOOD (ROUTINE X 2)     Status: None   Collection Time    10/19/13  3:15 PM      Result Value Ref Range Status   Specimen Description BLOOD RIGHT HAND   Final   Special Requests     Final   Value: Normal BOTTLES DRAWN AEROBIC AND ANAEROBIC 5CC EACH   Culture  Setup Time     Final   Value: 10/19/2013 20:29     Performed at [facility]   Culture     Final   Value:        BLOOD  CULTURE RECEIVED NO GROWTH TO DATE CULTURE WILL BE HELD FOR 5 DAYS BEFORE ISSUING A FINAL NEGATIVE REPORT     Performed at [facility]   Report Status PENDING   Incomplete     Scheduled Meds: . alfuzosin  10 mg Oral Daily  . allopurinol  300 mg Oral Daily  . azithromycin  500 mg Oral Daily  . cefTRIAXone (ROCEPHIN)  IV  1 g Intravenous Q24H  . famotidine  20 mg Oral QHS  . finasteride  5 mg Oral QPM  . Influenza vac split quadrivalent PF  0.5 mL Intramuscular Tomorrow-1000  . multivitamin with minerals  1 tablet Oral Daily  . pyridoxine  100 mg Oral BID  . sodium chloride  3 mL Intravenous Q12H  . warfarin  2.5 mg Oral ONCE-1800  . Warfarin - Pharmacist Dosing Inpatient   Does not apply q1800   Continuous Infusions:    Shadasia Oldfield, DO  [facility] Pager [phone]  If 7PM-7AM, please contact night-coverage [email or web address] Password [email or web address] 10/21/2013, 9:42 AM   LOS: 2 days

## 2013-10-21 NOTE — Progress Notes (Signed)
[place] for Warfarin Indication: atrial fibrillation  Allergies  Allergen Reactions  . Moxifloxacin Hcl In Nacl Other (See Comments) and Itching    Pt c/o being "out of his head" & itching  . Methylprednisolone Other (See Comments)    Dizziness, passed out twice  . Ramipril Other (See Comments)    Renal function worsened on ACEi.    Patient Measurements: Height: 6\' 2"  (188 cm) Weight: 214 lb 8.1 oz (97.3 kg) IBW/kg (Calculated) : 82.2  Vital Signs: Temp: 98.8 F (37.1 C) (10/03 0527) Temp Source: Oral (10/03 0527) BP: 103/55 mmHg (10/03 0527) Pulse Rate: 81 (10/03 0527)  Labs:  Recent Labs  10/19/13 1345 10/19/13 2050 10/20/13 0240 10/20/13 0810 10/21/13 0415  HGB 10.3*  --  9.3*  --  9.3*  HCT 30.7*  --  28.1*  --  27.3*  PLT 124*  --  114*  --  130*  LABPROT 39.5*  --   --  39.1* 30.9*  INR 4.07*  --   --  3.98* 2.95*  CREATININE 2.00*  --  1.89*  --  1.97*  TROPONINI  --  <0.30 <0.30 <0.30  --     Estimated Creatinine Clearance: 32.5 ml/min (by C-G formula based on Cr of 1.97).  Assessment: 80 yo male with MDS admitted 10/1 with flank pain. On chronic warfarin for atrial fibrillation. Pharmacy is consulted to continue dosing on admission.  INR  supratherapeutic (4.07) at admission, likely from recent antibiotic use for bronchitis.   Home dose reported as 5mg  daily - last taken 9/30  Warfarin has been held x 2 days since admission  Today 10/3:  INR = 2.95  CBC low - decreased to 9.3 (? Related to IVF/dilution), pltc low but consistent with previous results given MDS  Diet: Heart Healthy w/ good po intake  No bleeding reported  Concurrent antibiotics for pneumonia may increase warfarin sensitivity  Goal of Therapy:  INR 2-3   Plan:   Give warfarin 2.5mg  po x 1 today  Daily PT/INR  Kizzie Furnish, PharmD Pager: [phone] 10/21/2013 8:13 AM

## 2013-10-22 ENCOUNTER — Inpatient Hospital Stay (HOSPITAL_COMMUNITY): Payer: Medicare Other

## 2013-10-22 ENCOUNTER — Encounter (HOSPITAL_COMMUNITY): Payer: Self-pay | Admitting: Radiology

## 2013-10-22 DIAGNOSIS — A419 Sepsis, unspecified organism: Secondary | ICD-10-CM | POA: Diagnosis not present

## 2013-10-22 LAB — LEGIONELLA ANTIGEN, URINE

## 2013-10-22 LAB — CBC
HEMATOCRIT: 28 % — AB (ref 39.0–52.0)
HEMOGLOBIN: 9.4 g/dL — AB (ref 13.0–17.0)
MCH: 31.8 pg (ref 26.0–34.0)
MCHC: 33.6 g/dL (ref 30.0–36.0)
MCV: 94.6 fL (ref 78.0–100.0)
Platelets: 148 10*3/uL — ABNORMAL LOW (ref 150–400)
RBC: 2.96 MIL/uL — ABNORMAL LOW (ref 4.22–5.81)
RDW: 15.2 % (ref 11.5–15.5)
WBC: 10.7 10*3/uL — AB (ref 4.0–10.5)

## 2013-10-22 LAB — BASIC METABOLIC PANEL
Anion gap: 12 (ref 5–15)
BUN: 30 mg/dL — ABNORMAL HIGH (ref 6–23)
CALCIUM: 8.7 mg/dL (ref 8.4–10.5)
CO2: 22 meq/L (ref 19–32)
CREATININE: 1.69 mg/dL — AB (ref 0.50–1.35)
Chloride: 103 mEq/L (ref 96–112)
GFR calc Af Amer: 41 mL/min — ABNORMAL LOW (ref >90)
GFR calc non Af Amer: 35 mL/min — ABNORMAL LOW (ref >90)
Glucose, Bld: 110 mg/dL — ABNORMAL HIGH (ref 70–99)
Potassium: 4 mEq/L (ref 3.7–5.3)
Sodium: 137 mEq/L (ref 137–147)

## 2013-10-22 LAB — PROTIME-INR
INR: 2.64 — ABNORMAL HIGH (ref 0.00–1.49)
Prothrombin Time: 28.4 seconds — ABNORMAL HIGH (ref 11.6–15.2)

## 2013-10-22 MED ORDER — WARFARIN SODIUM 5 MG PO TABS
5.0000 mg | ORAL_TABLET | Freq: Once | ORAL | Status: DC
  Filled 2013-10-22: qty 1

## 2013-10-22 MED ORDER — IOHEXOL 300 MG/ML  SOLN
100.0000 mL | Freq: Once | INTRAMUSCULAR | Status: AC | PRN
  Administered 2013-10-22: 80 mL via INTRAVENOUS

## 2013-10-22 NOTE — Progress Notes (Signed)
PROGRESS NOTE  [name] MOQ:[id number] DOB: 1927-06-07 DOA: 10/19/2013 PCP: Thurman Coyer, MD  Brief history  80 year old male with a history of MDS-- refractory anemia (follows Ennever) on Aranesp, third-degree heart block s/p PPM, cardiomyopathy with EF 25-30%, permanent atrial fibrillation, and COPD presents with one-day history of right flank pain extending from the patient's right-sided chest to right upper abdomen which began approximately 12-24 hours prior to admission. The patient denies any recent injury or trauma. Incidentally, the patient saw his primary care provider approximately 2 days prior to this admission. There was concern for bronchitis and the patient was started on cephalexin and prednisone. The patient did not clinically improve and continued to have fevers. CT of the chest at time of admission noted an airspace opacity in the posterior portion of the right lower lobe. The patient was treated for pneumonia, but he continued to have fever. CT of the abdomen with renal stone protocol revealed a nonobstructing stone in the left kidney and small right pleural effusion. Both previous CTs revealed a left hepatic lobe hypoattenuating lesion. Unfortunately, MRI could not be performed to clarify the hepatic lesion due to the patient's PPM. After discussion with interventional radiology it was determined that there were no other noninvasive radiographic studies which were clarify the etiology of the patient's liver mass. After Discussion with the patient's family and nephrology, it was agreed to pursue CT of the Liver with and without contrast.  Assessment/Plan:  Sepsis  -Present at the time of admission with leukocytosis fever  -Secondary to pneumonia  -Appears to have failed outpatient therapy (cephalexin)  -Follow blood cultures--neg to date  -Leukocytosis is probably attributable to recent prednisone in the outpatient setting  -WBC now improving community acquired  pneumonia  -Continue azithromycin and ceftriaxone that was started in the emergency department  -Urine Legionella antigen--neg  -urine Streptococcus pneumoniae antigen--neg  -Although chest x-ray did not reveal gross consolidation, CT of the chest revealed a posterior right lower lobe opacity  -still with fever,but trending down  Hemoptysis/Pleural Effusion -Hemoptysis likely due to pneumonia in the setting of supratherapeutic INR due to Coumadin -d/c coumadin for now--pt states he does not want to continue coumadin if possible -monitor Hgb -may need pulmonary consult if fever persists, hemoptysis worsens -volume of hemoptysis is scant Persistent Fever/Liver Mass   -Patient cannot have MRI secondary to PPM  -I have discussed with radiology, Dr. Vernard Gambles who has stated that there are no other noninvasive radiographic studies which will help clarify the etiology of the patient's liver mass  -CT of the liver with and without contrast were clarify etiology and therefore avoid biopsy of the liver if this was consistent with an hemangioma  -Pursuing liver biopsy at this time without further clarification with that the patient had significant bleeding risk if this was a hemangioma  -Unfortunately, there are no other radiographic other diagnostic modalities which will help clarify the patient's liver mass at this time  -I have discussed extensively with the patient and his family the risks involved with CT of the abdomen/liver with intravenous contrast including but not limited to progressive renal failure--they have expressed understanding and wish to pursue CT of Liver  -As discussed, there are no other diagnostic modalities to help clarify the patient's liver mass  -10/22/13--CT reveals liver mass likely hemangioma-->no further workup Right chest pain/right flank pain  -May be related to RLL PNA  -antitussive  -cycle troponins--neg  Permanent atrial fibrillation  -  D/c warfarin for now in setting  of hemoptysis -Mildly supratherapeutic INR likely due to recent antibiotics  Cardiomyopathy  -Echocardiogram 02/06/2013 showed EF 25-30%  -Presently euvolemic  Complete heart block  -s/p PPM  CKD stage III  -baseline creatinine 1.6-1.8  -pt already given 1.5L NS, will not give any additional as pt taking good po  -Start gentle hydration in preparation for the patient's contrasted CT  -Will avoid any nephrotoxic agents and hemodynamic instability  -Finish mucomyst tonight Family Communication: Family updated at bedside, total time 33min, >50% spent counseling and coodinating care  Disposition Plan: Home when medically stable  Antibiotics:  Ceftriaxone 10/19/13>>>  Azithromycin 10/19/13>>>     Procedures/Studies: Ct Abdomen Pelvis W Wo Contrast  10/22/2013   CLINICAL DATA:  Liver lesion on prior stone study.  EXAM: CT ABDOMEN AND PELVIS WITHOUT AND WITH CONTRAST  TECHNIQUE: Multidetector CT imaging of the abdomen and pelvis was performed following the standard protocol before and following the bolus administration of intravenous contrast.  CONTRAST:  51mL OMNIPAQUE IOHEXOL 300 MG/ML  SOLN  COMPARISON:  CT of 10/19/2013.  Renal Ultrasound 10/05/2012.  FINDINGS: Lower chest: Increased right base airspace disease. Moderate cardiomegaly with a dual lead pacer. A small to moderate right pleural effusion is slightly increased.  Hepatobiliary: Lesion straddling the medial and lateral segments of the left lobe of the liver is again identified. This measures 3.5 x 4.2 cm. The arterial phase images are suboptimal secondary to bolus timing and poor parenchymal opacification. Portal venous phase images demonstrate a suggestion of peripheral nodular enhancement, including on images 19 and 20 of series 4. Mild contrast fill on image 19 of series 5.  Normal gallbladder, without biliary ductal dilatation.  Pancreas: Normal, without mass or pancreatic ductal dilatation.  Spleen: Normal  Adrenals/Urinary Tract:  Normal adrenal glands. Lower pole left renal calculus of 7 mm. A right lower pole renal cysts. Upper pole right renal scarring. No hydronephrosis. Normal urinary bladder.  Stomach/Bowel: Normal stomach, without wall thickening. Colonic diverticulosis with muscular hypertrophy involving the sigmoid. Normal terminal ileum and appendix. Normal small bowel without abdominal ascites.  Vascular/Lymphatic: Aortic and branch vessel atherosclerosis. No retroperitoneal or retrocrural adenopathy. No pelvic adenopathy.  Reproductive:  Moderate prostatomegaly.  Other: No significant free fluid. Suspect prior right inguinal hernia repair.  Musculoskeletal:  Osteopenia.  IMPRESSION: 1. Mild degradation secondary to poor arterial contrast opacification. 2. Left liver lobe lesion which is strongly favored to represent a hemangioma. Given mild limitations of the current exam, ultrasound could be performed at 6 months to confirm size stability. 3. Increase in right pleural effusion with adjacent collapse/consolidative change. This could represent atelectasis or developing infection. 4. Left nephrolithiasis. 5. Prostatomegaly.   Electronically Signed   By: Abigail Miyamoto M.D.   On: 10/22/2013 10:43   Dg Chest 2 View  10/19/2013   CLINICAL DATA:  One day history of chest pain with cough and fever  EXAM: CHEST  2 VIEW  COMPARISON:  June 23, 2010  FINDINGS: There is bibasilar lung scarring. There is no appreciable edema or consolidation. Heart is upper normal in size with pulmonary vascularity within normal limits. No adenopathy. Pacemaker leads positions are stable compared to prior study. There is degenerative change in the thoracic spine.  IMPRESSION: Bibasilar scarring. No edema or consolidation. No change in cardiac silhouette.   Electronically Signed   By: Lowella Grip M.D.   On: 10/19/2013 12:20   Ct Chest Wo Contrast  10/19/2013   CLINICAL DATA:  Fever, right posterior back pain.  EXAM: CT CHEST WITHOUT CONTRAST   TECHNIQUE: Multidetector CT imaging of the chest was performed following the standard protocol without IV contrast.  COMPARISON:  Radiograph of same day.  FINDINGS: No pneumothorax is noted. Mild right pleural effusion is noted. Mild airspace opacity is noted medially in the posterior portion the right lower lobe consistent with subsegmental atelectasis or possibly pneumonia. Mild cardiomegaly is noted. Left-sided pacemaker is noted with leads in grossly good position. Anterior osteophyte formation is seen in the lower thoracic spine. No mediastinal mass or adenopathy is noted. Coronary artery calcifications are noted. 4.4 x 3.2 cm low density is noted in the superior portion of the liver with Hounsfield measurement of 32. Further evaluation with MRI is recommended.  IMPRESSION: Mild right pleural effusion.  Airspace opacity is seen medially in posterior portion of right lower lobe consistent with pneumonia or subsegmental atelectasis.  4.4 cm low density is noted in the superior portion the liver which is not diagnostic for cyst and concerning for possible neoplasm. Further evaluation with MRI is recommended.   Electronically Signed   By: Sabino Dick M.D.   On: 10/19/2013 15:19   Dg Foot 2 Views Right  10/10/2013   View right indicates plate across the first metatarsal secondary to fusion  of the first metatarsal hallux with and he long gaited second toe and  pressure occurring with swelling of the interphalangeal joint but no  indication of stress fracture  Ct Renal Stone Study  10/19/2013   CLINICAL DATA:  Right flank pain beginning last night.  EXAM: CT ABDOMEN AND PELVIS WITHOUT CONTRAST  TECHNIQUE: Multidetector CT imaging of the abdomen and pelvis was performed following the standard protocol without IV contrast.  COMPARISON:  None.  FINDINGS: The patient has a small right pleural effusion but there is no left pleural effusions. Small pericardial effusion is identified. There is cardiomegaly.  The  adrenal glands, spleen, pancreas and biliary tree are unremarkable. The gallbladder appears normal. There is a hypo attenuating lesion in the left hepatic lobe measuring 2.7 x 3.7 cm on image 19. No lymphadenopathy or fluid is identified. Colonic diverticulosis is worst in the sigmoid but there is no diverticulitis. The colon is otherwise unremarkable. The stomach and small bowel appear normal. Extensive aortoiliac atherosclerosis without aneurysm is seen. No lytic or sclerotic bony lesion is identified.  IMPRESSION: Nonspecific hypo attenuating lesion left hepatic lobe could be a benign entity such as a hemangioma but may represent primary or metastatic neoplasm. MRI of the liver without without contrast is recommended for further evaluation.  Small right pleural effusion and small pericardial effusion.  Nonobstructing stone lower pole left kidney.  Diverticulosis without diverticulitis.   Electronically Signed   By: Inge Rise M.D.   On: 10/19/2013 14:19         Subjective: Patient complains of intermittent shortness of breath. He continues to complain of right-sided pleuritic chest pain. Denies fevers, chills, nausea, vomiting, diarrhea, abdominal pain. He had a bowel movement.  Objective: Filed Vitals:   10/21/13 2213 10/22/13 0626 10/22/13 1019 10/22/13 1410  BP: 114/53 101/50  120/55  Pulse: 81 81  80  Temp: 98.4 F (36.9 C) 98.6 F (37 C) 97.8 F (36.6 C) 98.8 F (37.1 C)  TempSrc: Oral Oral Oral Oral  Resp: 20 20  20   Height:      Weight:  97.8 kg (215 lb 9.8 oz)    SpO2: 94% 95%  94%  Intake/Output Summary (Last 24 hours) at 10/22/13 1613 Last data filed at 10/22/13 1500  Gross per 24 hour  Intake 2489.58 ml  Output   1485 ml  Net 1004.58 ml   Weight change: 0.5 kg (1 lb 1.6 oz) Exam:   General:  Pt is alert, follows commands appropriately, not in acute distress  HEENT: No icterus, No thrush,[place]/AT  Cardiovascular: RRR, S1/S2, no rubs, no  gallops  Respiratory: Diminished breath sounds right base. Left clear to auscultation. No wheezing.  Abdomen: Soft/+BS, non tender, non distended, no guarding  Extremities: trace LE edema, No lymphangitis, No petechiae, No rashes, no synovitis  Data Reviewed: Basic Metabolic Panel:  Recent Labs Lab 10/19/13 1345 10/20/13 0240 10/21/13 0415 10/22/13 0441  NA 135* 131* 136* 137  K 4.2 4.3 3.8 4.0  CL 98 97 101 103  CO2 24 23 24 22   GLUCOSE 107* 126* 117* 110*  BUN 32* 31* 36* 30*  CREATININE 2.00* 1.89* 1.97* 1.69*  CALCIUM 9.0 8.3* 8.8 8.7   Liver Function Tests:  Recent Labs Lab 10/19/13 1345 10/20/13 0240 10/21/13 0415  AST 28 23 17   ALT 33 29 24  ALKPHOS 76 72 80  BILITOT 1.6* 1.3* 1.1  PROT 7.0 6.2 6.1  ALBUMIN 3.4* 2.9* 2.7*   No results found for this basename: LIPASE, AMYLASE,  in the last 168 hours No results found for this basename: AMMONIA,  in the last 168 hours CBC:  Recent Labs Lab 10/19/13 1345 10/20/13 0240 10/21/13 0415 10/22/13 0441  WBC 21.6* 21.3* 14.0* 10.7*  NEUTROABS 17.3* 14.0*  --   --   HGB 10.3* 9.3* 9.3* 9.4*  HCT 30.7* 28.1* 27.3* 28.0*  MCV 95.9 95.6 94.1 94.6  PLT 124* 114* 130* 148*   Cardiac Enzymes:  Recent Labs Lab 10/19/13 2050 10/20/13 0240 10/20/13 0810  TROPONINI <0.30 <0.30 <0.30   BNP: No components found with this basename: POCBNP,  CBG: No results found for this basename: GLUCAP,  in the last 168 hours  Recent Results (from the past 240 hour(s))  CULTURE, BLOOD (ROUTINE X 2)     Status: None   Collection Time    10/19/13  3:00 PM      Result Value Ref Range Status   Specimen Description BLOOD RIGHT [facility]   Final   Special Requests     Final   Value: Normal BOTTLES DRAWN AEROBIC AND ANAEROBIC 5CC EACH   Culture  Setup Time     Final   Value: 10/19/2013 20:30     Performed at [facility]   Culture     Final   Value:        BLOOD CULTURE RECEIVED NO GROWTH TO DATE CULTURE WILL BE HELD FOR 5  DAYS BEFORE ISSUING A FINAL NEGATIVE REPORT     Performed at [facility]   Report Status PENDING   Incomplete  CULTURE, BLOOD (ROUTINE X 2)     Status: None   Collection Time    10/19/13  3:15 PM      Result Value Ref Range Status   Specimen Description BLOOD RIGHT HAND   Final   Special Requests     Final   Value: Normal BOTTLES DRAWN AEROBIC AND ANAEROBIC 5CC EACH   Culture  Setup Time     Final   Value: 10/19/2013 20:29     Performed at [facility]   Culture     Final   Value:  BLOOD CULTURE RECEIVED NO GROWTH TO DATE CULTURE WILL BE HELD FOR 5 DAYS BEFORE ISSUING A FINAL NEGATIVE REPORT     Performed at [facility]   Report Status PENDING   Incomplete     Scheduled Meds: . acetylcysteine  1,200 mg Oral BID  . alfuzosin  10 mg Oral Daily  . allopurinol  300 mg Oral Daily  . azithromycin  500 mg Oral Daily  . cefTRIAXone (ROCEPHIN)  IV  1 g Intravenous Q24H  . famotidine  20 mg Oral QHS  . finasteride  5 mg Oral QPM  . multivitamin with minerals  1 tablet Oral Daily  . pyridoxine  100 mg Oral BID  . sodium chloride  3 mL Intravenous Q12H   Continuous Infusions: . sodium chloride 0.9 % 1,000 mL with potassium chloride 20 mEq infusion 75 mL/hr at 10/22/13 1320     Eilish Mcdaniel, DO  [facility] Pager [phone]  If 7PM-7AM, please contact night-coverage [email or web address] Password [email or web address] 10/22/2013, 4:13 PM   LOS: 3 days

## 2013-10-22 NOTE — Progress Notes (Addendum)
[place] for Warfarin Indication: atrial fibrillation  Allergies  Allergen Reactions  . Moxifloxacin Hcl In Nacl Other (See Comments) and Itching    Pt c/o being "out of his head" & itching  . Methylprednisolone Other (See Comments)    Dizziness, passed out twice  . Ramipril Other (See Comments)    Renal function worsened on ACEi.    Patient Measurements: Height: 6\' 2"  (188 cm) Weight: 215 lb 9.8 oz (97.8 kg) IBW/kg (Calculated) : 82.2  Vital Signs: Temp: 98.6 F (37 C) (10/04 0626) Temp Source: Oral (10/04 0626) BP: 101/50 mmHg (10/04 0626) Pulse Rate: 81 (10/04 0626)  Labs:  Recent Labs  10/19/13 2050 10/20/13 0240 10/20/13 0810 10/21/13 0415 10/22/13 0441  HGB  --  9.3*  --  9.3* 9.4*  HCT  --  28.1*  --  27.3* 28.0*  PLT  --  114*  --  130* 148*  LABPROT  --   --  39.1* 30.9* 28.4*  INR  --   --  3.98* 2.95* 2.64*  CREATININE  --  1.89*  --  1.97* 1.69*  TROPONINI <0.30 <0.30 <0.30  --   --     Estimated Creatinine Clearance: 37.8 ml/min (by C-G formula based on Cr of 1.69).  Assessment: 80 yo male with MDS admitted 10/1 with flank pain. On chronic warfarin for atrial fibrillation. Pharmacy is consulted to continue dosing on admission.  INR  supratherapeutic (4.07) at admission, likely from recent antibiotic use for bronchitis.   Home dose reported as 5mg  daily - last taken 9/30  Warfarin has been held x 2 days since admission  Today 10/4:  INR = 2.64  Warfarin doses: 10/3: 2.5mg   CBC: Hgb low - decreased to 9.4 (? Related to IVF/dilution), pltc low but consistent with previous results given MDS  Diet: Heart Healthy w/ good po intake  No bleeding reported  Concurrent antibiotics for pneumonia may increase warfarin sensitivity  Goal of Therapy:  INR 2-3   Plan:   Give warfarin 5mg  po x 1 today  Daily PT/INR  Kizzie Furnish, PharmD Pager: [phone] 10/22/2013 8:37 AM

## 2013-10-23 ENCOUNTER — Inpatient Hospital Stay (HOSPITAL_COMMUNITY): Payer: Medicare Other

## 2013-10-23 ENCOUNTER — Encounter (HOSPITAL_COMMUNITY): Payer: Self-pay | Admitting: Radiology

## 2013-10-23 DIAGNOSIS — I4891 Unspecified atrial fibrillation: Secondary | ICD-10-CM

## 2013-10-23 DIAGNOSIS — J90 Pleural effusion, not elsewhere classified: Secondary | ICD-10-CM

## 2013-10-23 LAB — CBC
HEMATOCRIT: 28.5 % — AB (ref 39.0–52.0)
HEMOGLOBIN: 9.2 g/dL — AB (ref 13.0–17.0)
MCH: 31.1 pg (ref 26.0–34.0)
MCHC: 32.3 g/dL (ref 30.0–36.0)
MCV: 96.3 fL (ref 78.0–100.0)
Platelets: 161 10*3/uL (ref 150–400)
RBC: 2.96 MIL/uL — ABNORMAL LOW (ref 4.22–5.81)
RDW: 15.3 % (ref 11.5–15.5)
WBC: 11.1 10*3/uL — AB (ref 4.0–10.5)

## 2013-10-23 LAB — PROTIME-INR
INR: 1.9 — AB (ref 0.00–1.49)
INR: 2.28 — AB (ref 0.00–1.49)
PROTHROMBIN TIME: 22 s — AB (ref 11.6–15.2)
Prothrombin Time: 25.3 seconds — ABNORMAL HIGH (ref 11.6–15.2)

## 2013-10-23 LAB — BASIC METABOLIC PANEL
ANION GAP: 12 (ref 5–15)
BUN: 28 mg/dL — ABNORMAL HIGH (ref 6–23)
CHLORIDE: 105 meq/L (ref 96–112)
CO2: 21 meq/L (ref 19–32)
Calcium: 8.8 mg/dL (ref 8.4–10.5)
Creatinine, Ser: 1.64 mg/dL — ABNORMAL HIGH (ref 0.50–1.35)
GFR calc Af Amer: 43 mL/min — ABNORMAL LOW (ref >90)
GFR calc non Af Amer: 37 mL/min — ABNORMAL LOW (ref >90)
GLUCOSE: 113 mg/dL — AB (ref 70–99)
Potassium: 4.3 mEq/L (ref 3.7–5.3)
SODIUM: 138 meq/L (ref 137–147)

## 2013-10-23 MED ORDER — SODIUM CHLORIDE 0.9 % IV SOLN
Freq: Once | INTRAVENOUS | Status: AC
  Administered 2013-10-23: 12:00:00 via INTRAVENOUS

## 2013-10-23 NOTE — Consult Note (Signed)
PULMONARY / CRITICAL CARE MEDICINE   Name: [name] MRN: [id number] DOB: [birth year]    ADMISSION DATE:  10/19/2013 CONSULTATION DATE:  10/23/2013  REFERRING MD :  Tat  CHIEF COMPLAINT:  Chest pain, shortness of breath  INITIAL PRESENTATION: 80 y/o male with CHF and afib on warfarin admitted on 10/1 with CAP.  PCCM consulted on 10/5 for a growing parapneumonic effusion.  STUDIES:  10/1 CT chest > RLL pneumonia, small to moderate effusion 10/4 CT abdomen> hemangioma liver, increased R effusion and consolidation 10/5 CT chest > effusion increased, loculated, increased RLL mass/consolidation with likely post obstructive pneumonia  SIGNIFICANT EVENTS:   HISTORY OF PRESENT ILLNESS:  This is a pleasant 80 y/o male with CHF and MDS who was admitted on 10/1 with CAP and developed worsening effusion of the right lung so PCCM was consulted on 10/5.  He tells me that he had been experiencing worsening dyspnea, cough and mucus production for about a week which he had been treating with over the counter medications.  However last Thursday (10/1) he developed the sudden onset of worsening chest pain and dyspnea with fever.  He has been coughing up mucus which is occasionally streaked with blood.  He states that overall he is feeling better since his hospitalization, but his dyspnea has not improved much.  PCCM was consulted because his effusion appeared to be increasing in size, see description above. In the last year his weight has been stable. He smoked quite a bit in the past but quit smoking around 1970.  He also notes that he had multiple episodes of pneumonia in high school and when he was in the [employer]  He was told that he had lung "scarring" after that.   PAST MEDICAL HISTORY :   has a past medical history of Bursitis; Tinnitus; Permanent atrial fibrillation; Depressed ejection fraction (10); Chronic anemia; Blood transfusion; GERD (gastroesophageal reflux disease); MDS (myelodysplastic  syndrome), low grade (02/10/2011); Iron overload, transfusional (02/10/2011); Complete heart block (12/28/06); CHF (congestive heart failure); Chronic bronchitis; Pneumonia; H/O hiatal hernia; Arthritis; Gout; and Kidney stones (1985).  has past surgical history that includes Total knee arthroplasty (Bilateral); Joint replacement; Pilonidal cyst excision (1970's); Kidney stone surgery (1985); AV node ablation (12/08); Insert / replace / remove pacemaker (12/28/2006; 04/11/2013); Bunionectomy (Bilateral, 11/2012- 01/2013); Inguinal hernia repair (Right); Lithotripsy (1985); Kidney stone surgery (1986); and Cataract extraction w/ intraocular lens  implant, bilateral (Bilateral). Prior to Admission medications   Medication Sig Start Date End Date Taking? Authorizing Provider  alfuzosin (UROXATRAL) 10 MG 24 hr tablet Take 1 tablet by mouth daily. 07/22/13  Yes Historical Provider, MD  allopurinol (ZYLOPRIM) 300 MG tablet Take 300 mg by mouth daily. 07/26/13  Yes Historical Provider, MD  carvedilol (COREG) 6.25 MG tablet Take 1 tablet (6.25 mg total) by mouth 2 (two) times daily with a meal. 10/11/13  Yes Darlin Coco, MD  famotidine (PEPCID) 20 MG tablet Take 40 mg by mouth at bedtime.    Yes Historical Provider, MD  finasteride (PROSCAR) 5 MG tablet Take 5 mg by mouth every evening. 5pm   Yes Historical Provider, MD  furosemide (LASIX) 40 MG tablet Take 40 mg by mouth daily.   Yes Historical Provider, MD  Multiple Vitamin (MULTIVITAMIN WITH MINERALS) TABS tablet Take 1 tablet by mouth daily.   Yes Historical Provider, MD  pyridoxine (B-6) 100 MG tablet Take 100 mg by mouth 2 (two) times daily.    Yes Historical Provider, MD  warfarin (COUMADIN) 5  MG tablet Take 5 mg by mouth daily. As directed.   Yes Historical Provider, MD   Allergies  Allergen Reactions  . Moxifloxacin Hcl In Nacl Other (See Comments) and Itching    Pt c/o being "out of his head" & itching  . Methylprednisolone Other (See Comments)     Dizziness, passed out twice  . Ramipril Other (See Comments)    Renal function worsened on ACEi.    FAMILY HISTORY:  indicated that his mother is deceased. He indicated that his father is deceased. He indicated that his brother is alive.  SOCIAL HISTORY:  reports that he quit smoking about 45 years ago. His smoking use included Cigarettes. He started smoking about 69 years ago. He has a 25 pack-year smoking history. He has never used smokeless tobacco. He reports that he does not drink alcohol or use illicit drugs.  REVIEW OF SYSTEMS:   Gen: Denies fever, chills, weight change, fatigue, night sweats HEENT: Denies blurred vision, double vision, hearing loss, tinnitus, sinus congestion, rhinorrhea, sore throat, neck stiffness, dysphagia PULM: per HPI CV: Denies chest pain, edema, orthopnea, paroxysmal nocturnal dyspnea, palpitations GI: Denies abdominal pain, nausea, vomiting, diarrhea, hematochezia, melena, constipation, change in bowel habits GU: Denies dysuria, hematuria, polyuria, oliguria, urethral discharge Endocrine: Denies hot or cold intolerance, polyuria, polyphagia or appetite change Derm: Denies rash, dry skin, scaling or peeling skin change Heme: Denies easy bruising, bleeding, bleeding gums Neuro: Denies headache, numbness, weakness, slurred speech, loss of memory or consciousness   SUBJECTIVE:   VITAL SIGNS: Temp:  [98.4 F (36.9 C)-99.7 F (37.6 C)] 98.4 F (36.9 C) (10/05 0307) Pulse Rate:  [78-82] 82 (10/05 0307) Resp:  [20] 20 (10/05 0307) BP: (120-136)/(55-63) 136/63 mmHg (10/05 0307) SpO2:  [92 %-96 %] 92 % (10/05 0307) Weight:  [98.7 kg (217 lb 9.5 oz)] 98.7 kg (217 lb 9.5 oz) (10/05 0307) HEMODYNAMICS:   VENTILATOR SETTINGS:   INTAKE / OUTPUT:  Intake/Output Summary (Last 24 hours) at 10/23/13 1133 Last data filed at 10/23/13 0826  Gross per 24 hour  Intake 841.25 ml  Output   1125 ml  Net -283.75 ml    PHYSICAL EXAMINATION: Gen: no acute  distress HEENT: NCAT, EOMi, OP clear, neck supple without masses PULM: Diminished R base, clear on left CV: Irreg irreg on my exam, no mgr, no JVD AB: BS+, soft, nontender Ext: warm, trace ankle edema, no clubbing, no cyanosis Derm: no rash or skin breakdown Neuro: A&Ox4, MAEW   LABS:  CBC  Recent Labs Lab 10/21/13 0415 10/22/13 0441 10/23/13 0445  WBC 14.0* 10.7* 11.1*  HGB 9.3* 9.4* 9.2*  HCT 27.3* 28.0* 28.5*  PLT 130* 148* 161   Coag's  Recent Labs Lab 10/21/13 0415 10/22/13 0441 10/23/13 0445  INR 2.95* 2.64* 2.28*   BMET  Recent Labs Lab 10/21/13 0415 10/22/13 0441 10/23/13 0445  NA 136* 137 138  K 3.8 4.0 4.3  CL 101 103 105  CO2 24 22 21   BUN 36* 30* 28*  CREATININE 1.97* 1.69* 1.64*  GLUCOSE 117* 110* 113*   Electrolytes  Recent Labs Lab 10/21/13 0415 10/22/13 0441 10/23/13 0445  CALCIUM 8.8 8.7 8.8   Sepsis Markers  Recent Labs Lab 10/19/13 1406  LATICACIDVEN 1.62   ABG No results found for this basename: PHART, PCO2ART, PO2ART,  in the last 168 hours Liver Enzymes  Recent Labs Lab 10/19/13 1345 10/20/13 0240 10/21/13 0415  AST 28 23 17   ALT 33 29 24  ALKPHOS 76 72  80  BILITOT 1.6* 1.3* 1.1  ALBUMIN 3.4* 2.9* 2.7*   Cardiac Enzymes  Recent Labs Lab 10/19/13 2050 10/20/13 0240 10/20/13 0810  TROPONINI <0.30 <0.30 <0.30   Glucose No results found for this basename: GLUCAP,  in the last 168 hours  Imaging Ct Abdomen Pelvis W Wo Contrast  10/22/2013   CLINICAL DATA:  Liver lesion on prior stone study.  EXAM: CT ABDOMEN AND PELVIS WITHOUT AND WITH CONTRAST  TECHNIQUE: Multidetector CT imaging of the abdomen and pelvis was performed following the standard protocol before and following the bolus administration of intravenous contrast.  CONTRAST:  12mL OMNIPAQUE IOHEXOL 300 MG/ML  SOLN  COMPARISON:  CT of 10/19/2013.  Renal Ultrasound 10/05/2012.  FINDINGS: Lower chest: Increased right base airspace disease. Moderate  cardiomegaly with a dual lead pacer. A small to moderate right pleural effusion is slightly increased.  Hepatobiliary: Lesion straddling the medial and lateral segments of the left lobe of the liver is again identified. This measures 3.5 x 4.2 cm. The arterial phase images are suboptimal secondary to bolus timing and poor parenchymal opacification. Portal venous phase images demonstrate a suggestion of peripheral nodular enhancement, including on images 19 and 20 of series 4. Mild contrast fill on image 19 of series 5.  Normal gallbladder, without biliary ductal dilatation.  Pancreas: Normal, without mass or pancreatic ductal dilatation.  Spleen: Normal  Adrenals/Urinary Tract: Normal adrenal glands. Lower pole left renal calculus of 7 mm. A right lower pole renal cysts. Upper pole right renal scarring. No hydronephrosis. Normal urinary bladder.  Stomach/Bowel: Normal stomach, without wall thickening. Colonic diverticulosis with muscular hypertrophy involving the sigmoid. Normal terminal ileum and appendix. Normal small bowel without abdominal ascites.  Vascular/Lymphatic: Aortic and branch vessel atherosclerosis. No retroperitoneal or retrocrural adenopathy. No pelvic adenopathy.  Reproductive:  Moderate prostatomegaly.  Other: No significant free fluid. Suspect prior right inguinal hernia repair.  Musculoskeletal:  Osteopenia.  IMPRESSION: 1. Mild degradation secondary to poor arterial contrast opacification. 2. Left liver lobe lesion which is strongly favored to represent a hemangioma. Given mild limitations of the current exam, ultrasound could be performed at 6 months to confirm size stability. 3. Increase in right pleural effusion with adjacent collapse/consolidative change. This could represent atelectasis or developing infection. 4. Left nephrolithiasis. 5. Prostatomegaly.   Electronically Signed   By: Abigail Miyamoto M.D.   On: 10/22/2013 10:43     ASSESSMENT / PLAN:  PULMONARY  A: Community acquired  pneumonia > agree with current treatment, could stop azithromycin Parapneumonic effusion> I have reviewed all images carefully.  I think that this is infectious considering the fairly rapid change in a few days (would be unusual for a malignancy to do this).  However I share radiology's concern for possible empyema. Considering his CHF, the fact that the fluid has grown may be partially related to this given that he has gained 5 lbs this admission P:   -correct INR for thoracentesis (will likely be ready tomorrow morning) -goal INR < 1.7 -continue ceftriaxone, would consider stopping azithromycin as I doubt its adding much -will send thoracentesis fluid for cytology, culture, cell count, LDH/Protein -add on serum LDH/Protein to tomorrow's labs -consider diuresis  CARDIOVASCULAR A: Compensated systolic heart failure Afib P:  -per TRH -consider diuresis given weight gain   HEMATOLOGIC A:  Coagulopathy from warfarin P:  -FFP slow today given CHF -f/u INR at midnight, give more FFP if not < 1.7    Roselie Awkward, MD [facility] Pager: [phone] Cell: (  [phone] If no response, call [phone]  10/23/2013, 11:33 AM

## 2013-10-23 NOTE — Progress Notes (Signed)
PROGRESS NOTE  [name] TDH:[id number] DOB: 11/28/1927 DOA: 10/19/2013 PCP: Thurman Coyer, MD  Brief history  80 year old male with a history of MDS-- refractory anemia (follows Ennever) on Aranesp, third-degree heart block s/p PPM, cardiomyopathy with EF 25-30%, permanent atrial fibrillation, and COPD presents with one-day history of right flank pain extending from the patient's right-sided chest to right upper abdomen which began approximately 12-24 hours prior to admission. The patient denies any recent injury or trauma. Incidentally, the patient saw his primary care provider approximately 2 days prior to this admission. There was concern for bronchitis and the patient was started on cephalexin and prednisone. The patient did not clinically improve and continued to have fevers. CT of the chest at time of admission noted an airspace opacity in the posterior portion of the right lower lobe. The patient was treated for pneumonia, but he continued to have fever. CT of the abdomen with renal stone protocol revealed a nonobstructing stone in the left kidney and small right pleural effusion. Both previous CTs revealed a left hepatic lobe hypoattenuating lesion. Unfortunately, MRI could not be performed to clarify the hepatic lesion due to the patient's PPM. After discussion with interventional radiology it was determined that there were no other noninvasive radiographic studies which were clarify the etiology of the patient's liver mass. After Discussion with the patient's family and nephrology, it was agreed to pursue CT of the Liver with and without contrast. Dedicated CT of the liver with and without contrast suggested a hemangioma picture, but no mention of possible increase of right pleural effusion. On 10/23/2013, the patient complained of slight increase in shortness of breath. Dedicated CT of the chest without contrast suggested a masslike area RML with enlarging right pleural effusion.  As a result, on her consultation was obtained. In addition, the patient continued to have small amount hemoptysis.  Assessment/Plan:  Sepsis  -Present at the time of admission with leukocytosis, fever  -Secondary to pneumonia  -Appears to have failed outpatient therapy -Follow blood cultures--neg to date  -Leukocytosis is probably attributable to recent prednisone in the outpatient setting  -WBC now improving  R-pleural effusion/hemoptysis -10/23/2013--patient complained of slight increasing dypsnea on exertion -10/23/13--CT chest--RML 3.7x3.6cm mass like area with increasing R-pleural effusion -With the patient's hemoptysis and CT findings, pulmonary consultation was obtained -D/C  Warfarin--ask pulmonary opinion whether this should be d/ced altogether -plan for ASA if warfarin is d/ced community acquired pneumonia  -Continue azithromycin and ceftriaxone that was started in the emergency department  -Urine Legionella antigen--neg  -urine Streptococcus pneumoniae antigen--neg  -Although chest x-ray did not reveal gross consolidation, 10/01--CT of the chest revealed a posterior right lower lobe opacity  -still with fever,but trending down  Persistent Fever/Liver Mass  -fever has improved -Patient cannot have MRI secondary to PPM  -I have discussed with radiology, Dr. Vernard Gambles who has stated that there are no other noninvasive radiographic studies which will help clarify the etiology of the patient's liver mass  -CT of the liver with and without contrast were clarify etiology and therefore avoid biopsy of the liver if this was consistent with an hemangioma  -Pursuing liver biopsy at this time without further clarification with that the patient had significant bleeding risk if this was a hemangioma  -Unfortunately, there are no other radiographic other diagnostic modalities which will help clarify the patient's liver mass at this time  -I have discussed extensively with the patient and his  family  the risks involved with CT of the abdomen/liver with intravenous contrast including but not limited to progressive renal failure--they have expressed understanding and wish to pursue CT of Liver with contrast -As discussed, there are no other diagnostic modalities to help clarify the patient's liver mass  -10/22/13--CT reveals liver mass likely hemangioma-->no further workup  Right chest pain/right flank pain  -May be related to RLL PNA  -antitussive  -cycle troponins--neg  Permanent atrial fibrillation  -D/c warfarin for now in setting of hemoptysis  -Mildly supratherapeutic INR likely due to recent antibiotics  Cardiomyopathy  -Echocardiogram 02/06/2013 showed EF 25-30%  -Presently euvolemic  Complete heart block  -s/p PPM  CKD stage III  -baseline creatinine 1.6-1.8  -pt already given 1.5L NS, will not give any additional as pt taking good po  -Start gentle hydration in preparation for the patient's contrasted CT  -Will avoid any nephrotoxic agents and hemodynamic instability  -Finish mucomyst tonight  Family Communication: Family updated at bedside, total time 1min, >50% spent counseling and coodinating care  Disposition Plan: Home when medically stable  Antibiotics:  Ceftriaxone 10/19/13>>>  Azithromycin 10/19/13>>>     Procedures/Studies: Ct Abdomen Pelvis W Wo Contrast  10/22/2013   CLINICAL DATA:  Liver lesion on prior stone study.  EXAM: CT ABDOMEN AND PELVIS WITHOUT AND WITH CONTRAST  TECHNIQUE: Multidetector CT imaging of the abdomen and pelvis was performed following the standard protocol before and following the bolus administration of intravenous contrast.  CONTRAST:  81mL OMNIPAQUE IOHEXOL 300 MG/ML  SOLN  COMPARISON:  CT of 10/19/2013.  Renal Ultrasound 10/05/2012.  FINDINGS: Lower chest: Increased right base airspace disease. Moderate cardiomegaly with a dual lead pacer. A small to moderate right pleural effusion is slightly increased.  Hepatobiliary: Lesion  straddling the medial and lateral segments of the left lobe of the liver is again identified. This measures 3.5 x 4.2 cm. The arterial phase images are suboptimal secondary to bolus timing and poor parenchymal opacification. Portal venous phase images demonstrate a suggestion of peripheral nodular enhancement, including on images 19 and 20 of series 4. Mild contrast fill on image 19 of series 5.  Normal gallbladder, without biliary ductal dilatation.  Pancreas: Normal, without mass or pancreatic ductal dilatation.  Spleen: Normal  Adrenals/Urinary Tract: Normal adrenal glands. Lower pole left renal calculus of 7 mm. A right lower pole renal cysts. Upper pole right renal scarring. No hydronephrosis. Normal urinary bladder.  Stomach/Bowel: Normal stomach, without wall thickening. Colonic diverticulosis with muscular hypertrophy involving the sigmoid. Normal terminal ileum and appendix. Normal small bowel without abdominal ascites.  Vascular/Lymphatic: Aortic and branch vessel atherosclerosis. No retroperitoneal or retrocrural adenopathy. No pelvic adenopathy.  Reproductive:  Moderate prostatomegaly.  Other: No significant free fluid. Suspect prior right inguinal hernia repair.  Musculoskeletal:  Osteopenia.  IMPRESSION: 1. Mild degradation secondary to poor arterial contrast opacification. 2. Left liver lobe lesion which is strongly favored to represent a hemangioma. Given mild limitations of the current exam, ultrasound could be performed at 6 months to confirm size stability. 3. Increase in right pleural effusion with adjacent collapse/consolidative change. This could represent atelectasis or developing infection. 4. Left nephrolithiasis. 5. Prostatomegaly.   Electronically Signed   By: Abigail Miyamoto M.D.   On: 10/22/2013 10:43   Dg Chest 2 View  10/19/2013   CLINICAL DATA:  One day history of chest pain with cough and fever  EXAM: CHEST  2 VIEW  COMPARISON:  June 23, 2010  FINDINGS: There is bibasilar lung  scarring.  There is no appreciable edema or consolidation. Heart is upper normal in size with pulmonary vascularity within normal limits. No adenopathy. Pacemaker leads positions are stable compared to prior study. There is degenerative change in the thoracic spine.  IMPRESSION: Bibasilar scarring. No edema or consolidation. No change in cardiac silhouette.   Electronically Signed   By: Lowella Grip M.D.   On: 10/19/2013 12:20   Ct Chest Wo Contrast  10/23/2013   CLINICAL DATA:  Subsequent evaluation of 80 year old male with pneumonia and worsening hypoxemia. Evaluate for worsening pleural effusion, potential empyema. Cough and hemoptysis with shortness of breath and low-grade fever. History of myelodysplastic syndrome.  EXAM: CT CHEST WITHOUT CONTRAST  TECHNIQUE: Multidetector CT imaging of the chest was performed following the standard protocol without IV contrast.  COMPARISON:  Chest CT 10/19/2013.  FINDINGS: Mediastinum: Heart size is mildly enlarged with biatrial dilatation. Small amount of pericardial fluid and/or thickening, unlikely to be of hemodynamic significance at this time. There is atherosclerosis of the thoracic aorta, the great vessels of the mediastinum and the coronary arteries, including calcified atherosclerotic plaque in the left main, left anterior descending, left circumflex and right coronary arteries. Biventricular pacemaker device in position with lead tips terminating in the right ventricular apex and overlying the left ventricular anterior wall via the coronary sinus and coronary veins. Multiple borderline enlarged mediastinal lymph nodes, as well as a mildly enlarged subcarinal node measuring 11 mm in short axis. Right hilar nodal tissue appears prominent, but is difficult to separate from adjacent vasculature given the lack of IV contrast on today's examination. Esophagus is unremarkable in appearance.  Lungs/Pleura: Masslike area in the medial aspect of the right middle lobe  (image 49 of series 2) measuring 3.7 x 3.6 cm may simply represent mass like airspace consolidation, however, the possibility of underlying neoplasm is not excluded. In the adjacent right lower lobe lung parenchyma there is extensive ground-glass attenuation, septal thickening, multifocal nodularity and some atelectasis, all of which has increased compared to the prior study. Enlarging moderate right-sided pleural effusion, generally layering dependently, although some of this appears partially loculated in the apex of the right hemithorax.  Upper Abdomen: Again noted is a 3.2 x 4.6 cm intermediate attenuation lesion in the central aspect of the left lobe of the liver (segments 2 and 4A), recently characterized as a cavernous hemangioma.  Musculoskeletal: There are no aggressive appearing lytic or blastic lesions noted in the visualized portions of the skeleton.  IMPRESSION: 1. Although the findings in the right lower lobe may simply reflect a right lower lobe pneumonia, the increasingly mass like appearance in the medial aspect of the right lower lobe is concerning for potential neoplasm, and the adjacent changes in the right lower lobe could reflect worsening postobstructive pneumonitis/pneumonia. Clinical correlation is recommended. Given the enlarging partially loculated right-sided pleural effusion, sampling of the pleural fluid to exclude neoplastic cells, and to assess for potential empyema is suggested. If suspicious cells are identified by thoracentesis, further evaluation with PET-CT would be recommended. 2. Mildly enlarged subcarinal lymph node, and prominent nodal tissue in the right hilar region. While these findings could certainly be reactive in the setting of infection, underlying neoplasm is not excluded. 3. Atherosclerosis, including left main and 3 vessel coronary artery disease. 4. Additional findings, as above.   Electronically Signed   By: Vinnie Langton M.D.   On: 10/23/2013 09:29   Ct  Chest Wo Contrast  10/19/2013   CLINICAL DATA:  Fever, right posterior back  pain.  EXAM: CT CHEST WITHOUT CONTRAST  TECHNIQUE: Multidetector CT imaging of the chest was performed following the standard protocol without IV contrast.  COMPARISON:  Radiograph of same day.  FINDINGS: No pneumothorax is noted. Mild right pleural effusion is noted. Mild airspace opacity is noted medially in the posterior portion the right lower lobe consistent with subsegmental atelectasis or possibly pneumonia. Mild cardiomegaly is noted. Left-sided pacemaker is noted with leads in grossly good position. Anterior osteophyte formation is seen in the lower thoracic spine. No mediastinal mass or adenopathy is noted. Coronary artery calcifications are noted. 4.4 x 3.2 cm low density is noted in the superior portion of the liver with Hounsfield measurement of 32. Further evaluation with MRI is recommended.  IMPRESSION: Mild right pleural effusion.  Airspace opacity is seen medially in posterior portion of right lower lobe consistent with pneumonia or subsegmental atelectasis.  4.4 cm low density is noted in the superior portion the liver which is not diagnostic for cyst and concerning for possible neoplasm. Further evaluation with MRI is recommended.   Electronically Signed   By: Sabino Dick M.D.   On: 10/19/2013 15:19   Dg Foot 2 Views Right  10/10/2013   View right indicates plate across the first metatarsal secondary to fusion  of the first metatarsal hallux with and he long gaited second toe and  pressure occurring with swelling of the interphalangeal joint but no  indication of stress fracture  Ct Renal Stone Study  10/19/2013   CLINICAL DATA:  Right flank pain beginning last night.  EXAM: CT ABDOMEN AND PELVIS WITHOUT CONTRAST  TECHNIQUE: Multidetector CT imaging of the abdomen and pelvis was performed following the standard protocol without IV contrast.  COMPARISON:  None.  FINDINGS: The patient has a small right pleural  effusion but there is no left pleural effusions. Small pericardial effusion is identified. There is cardiomegaly.  The adrenal glands, spleen, pancreas and biliary tree are unremarkable. The gallbladder appears normal. There is a hypo attenuating lesion in the left hepatic lobe measuring 2.7 x 3.7 cm on image 19. No lymphadenopathy or fluid is identified. Colonic diverticulosis is worst in the sigmoid but there is no diverticulitis. The colon is otherwise unremarkable. The stomach and small bowel appear normal. Extensive aortoiliac atherosclerosis without aneurysm is seen. No lytic or sclerotic bony lesion is identified.  IMPRESSION: Nonspecific hypo attenuating lesion left hepatic lobe could be a benign entity such as a hemangioma but may represent primary or metastatic neoplasm. MRI of the liver without without contrast is recommended for further evaluation.  Small right pleural effusion and small pericardial effusion.  Nonobstructing stone lower pole left kidney.  Diverticulosis without diverticulitis.   Electronically Signed   By: Inge Rise M.D.   On: 10/19/2013 14:19         Subjective: Patient complains of mild increase in dyspnea on exertion. Denies any fevers, chills, nausea, vomiting, diarrhea. He still has some right pleuritic chest pain. Denies any abdominal pain, diarrhea. He has a scant amount of hemoptysis  Objective: Filed Vitals:   10/22/13 1019 10/22/13 1410 10/22/13 2056 10/23/13 0307  BP:  120/55 125/59 136/63  Pulse:  80 78 82  Temp: 97.8 F (36.6 C) 98.8 F (37.1 C) 99.7 F (37.6 C) 98.4 F (36.9 C)  TempSrc: Oral Oral Oral Oral  Resp:  20 20 20   Height:      Weight:    98.7 kg (217 lb 9.5 oz)  SpO2:  94% 96% 92%  Intake/Output Summary (Last 24 hours) at 10/23/13 1038 Last data filed at 10/23/13 0826  Gross per 24 hour  Intake 1081.25 ml  Output   1125 ml  Net -43.75 ml   Weight change: 0.9 kg (1 lb 15.7 oz) Exam:   General:  Pt is alert, follows  commands appropriately, not in acute distress  HEENT: No icterus, No thrush,  [place]/AT  Cardiovascular: RRR, S1/S2, no rubs, no gallops  Respiratory: Left clear to auscultation. Diminished breath sounds right base. Right basilar crackles  Abdomen: Soft/+BS, non tender, non distended, no guarding  Extremities: No edema, No lymphangitis, No petechiae, No rashes, no synovitis  Data Reviewed: Basic Metabolic Panel:  Recent Labs Lab 10/19/13 1345 10/20/13 0240 10/21/13 0415 10/22/13 0441 10/23/13 0445  NA 135* 131* 136* 137 138  K 4.2 4.3 3.8 4.0 4.3  CL 98 97 101 103 105  CO2 24 23 24 22 21   GLUCOSE 107* 126* 117* 110* 113*  BUN 32* 31* 36* 30* 28*  CREATININE 2.00* 1.89* 1.97* 1.69* 1.64*  CALCIUM 9.0 8.3* 8.8 8.7 8.8   Liver Function Tests:  Recent Labs Lab 10/19/13 1345 10/20/13 0240 10/21/13 0415  AST 28 23 17   ALT 33 29 24  ALKPHOS 76 72 80  BILITOT 1.6* 1.3* 1.1  PROT 7.0 6.2 6.1  ALBUMIN 3.4* 2.9* 2.7*   No results found for this basename: LIPASE, AMYLASE,  in the last 168 hours No results found for this basename: AMMONIA,  in the last 168 hours CBC:  Recent Labs Lab 10/19/13 1345 10/20/13 0240 10/21/13 0415 10/22/13 0441 10/23/13 0445  WBC 21.6* 21.3* 14.0* 10.7* 11.1*  NEUTROABS 17.3* 14.0*  --   --   --   HGB 10.3* 9.3* 9.3* 9.4* 9.2*  HCT 30.7* 28.1* 27.3* 28.0* 28.5*  MCV 95.9 95.6 94.1 94.6 96.3  PLT 124* 114* 130* 148* 161   Cardiac Enzymes:  Recent Labs Lab 10/19/13 2050 10/20/13 0240 10/20/13 0810  TROPONINI <0.30 <0.30 <0.30   BNP: No components found with this basename: POCBNP,  CBG: No results found for this basename: GLUCAP,  in the last 168 hours  Recent Results (from the past 240 hour(s))  CULTURE, BLOOD (ROUTINE X 2)     Status: None   Collection Time    10/19/13  3:00 PM      Result Value Ref Range Status   Specimen Description BLOOD RIGHT [facility]   Final   Special Requests     Final   Value: Normal BOTTLES DRAWN AEROBIC  AND ANAEROBIC 5CC EACH   Culture  Setup Time     Final   Value: 10/19/2013 20:30     Performed at [facility]   Culture     Final   Value:        BLOOD CULTURE RECEIVED NO GROWTH TO DATE CULTURE WILL BE HELD FOR 5 DAYS BEFORE ISSUING A FINAL NEGATIVE REPORT     Performed at [facility]   Report Status PENDING   Incomplete  CULTURE, BLOOD (ROUTINE X 2)     Status: None   Collection Time    10/19/13  3:15 PM      Result Value Ref Range Status   Specimen Description BLOOD RIGHT HAND   Final   Special Requests     Final   Value: Normal BOTTLES DRAWN AEROBIC AND ANAEROBIC 5CC EACH   Culture  Setup Time     Final   Value: 10/19/2013 20:29  Performed at [facility]     Final   Value:        BLOOD CULTURE RECEIVED NO GROWTH TO DATE CULTURE WILL BE HELD FOR 5 DAYS BEFORE ISSUING A FINAL NEGATIVE REPORT     Performed at [facility]   Report Status PENDING   Incomplete     Scheduled Meds: . alfuzosin  10 mg Oral Daily  . allopurinol  300 mg Oral Daily  . azithromycin  500 mg Oral Daily  . cefTRIAXone (ROCEPHIN)  IV  1 g Intravenous Q24H  . famotidine  20 mg Oral QHS  . finasteride  5 mg Oral QPM  . multivitamin with minerals  1 tablet Oral Daily  . pyridoxine  100 mg Oral BID  . sodium chloride  3 mL Intravenous Q12H   Continuous Infusions:    Sharley Keeler, DO  [facility] Pager [phone]  If 7PM-7AM, please contact night-coverage [email or web address] Password [email or web address] 10/23/2013, 10:38 AM   LOS: 4 days

## 2013-10-24 ENCOUNTER — Inpatient Hospital Stay (HOSPITAL_COMMUNITY): Payer: Medicare Other

## 2013-10-24 LAB — PREPARE FRESH FROZEN PLASMA
UNIT DIVISION: 0
Unit division: 0

## 2013-10-24 LAB — CBC
HCT: 27.4 % — ABNORMAL LOW (ref 39.0–52.0)
Hemoglobin: 9.2 g/dL — ABNORMAL LOW (ref 13.0–17.0)
MCH: 31.5 pg (ref 26.0–34.0)
MCHC: 33.6 g/dL (ref 30.0–36.0)
MCV: 93.8 fL (ref 78.0–100.0)
PLATELETS: 180 10*3/uL (ref 150–400)
RBC: 2.92 MIL/uL — AB (ref 4.22–5.81)
RDW: 15.3 % (ref 11.5–15.5)
WBC: 11.7 10*3/uL — ABNORMAL HIGH (ref 4.0–10.5)

## 2013-10-24 LAB — BASIC METABOLIC PANEL
Anion gap: 12 (ref 5–15)
BUN: 26 mg/dL — AB (ref 6–23)
CALCIUM: 9.3 mg/dL (ref 8.4–10.5)
CO2: 22 mEq/L (ref 19–32)
Chloride: 106 mEq/L (ref 96–112)
Creatinine, Ser: 1.6 mg/dL — ABNORMAL HIGH (ref 0.50–1.35)
GFR calc Af Amer: 44 mL/min — ABNORMAL LOW (ref >90)
GFR calc non Af Amer: 38 mL/min — ABNORMAL LOW (ref >90)
GLUCOSE: 106 mg/dL — AB (ref 70–99)
Potassium: 4.3 mEq/L (ref 3.7–5.3)
Sodium: 140 mEq/L (ref 137–147)

## 2013-10-24 LAB — PROTIME-INR
INR: 1.96 — ABNORMAL HIGH (ref 0.00–1.49)
INR: 1.82 — ABNORMAL HIGH (ref 0.00–1.49)
Prothrombin Time: 22.5 seconds — ABNORMAL HIGH (ref 11.6–15.2)
Prothrombin Time: 21.2 seconds — ABNORMAL HIGH (ref 11.6–15.2)

## 2013-10-24 LAB — BODY FLUID CELL COUNT WITH DIFFERENTIAL
EOS FL: 2 %
Lymphs, Fluid: 31 %
MONOCYTE-MACROPHAGE-SEROUS FLUID: 12 % — AB (ref 50–90)
Neutrophil Count, Fluid: 55 % — ABNORMAL HIGH (ref 0–25)
WBC FLUID: 821 uL (ref 0–1000)

## 2013-10-24 LAB — PROTEIN, BODY FLUID: Total protein, fluid: 3.6 g/dL

## 2013-10-24 LAB — PH, BODY FLUID: pH, Fluid: 8

## 2013-10-24 LAB — LACTATE DEHYDROGENASE, PLEURAL OR PERITONEAL FLUID: LD FL: 591 U/L — AB (ref 3–23)

## 2013-10-24 LAB — LACTATE DEHYDROGENASE: LDH: 137 U/L (ref 94–250)

## 2013-10-24 LAB — PROTEIN, TOTAL: Total Protein: 6.6 g/dL (ref 6.0–8.3)

## 2013-10-24 MED ORDER — SODIUM CHLORIDE 0.9 % IV SOLN
Freq: Once | INTRAVENOUS | Status: AC
  Administered 2013-10-24: 05:00:00 via INTRAVENOUS

## 2013-10-24 MED ORDER — SODIUM CHLORIDE 0.9 % IV SOLN: Freq: Once | INTRAVENOUS | Status: DC

## 2013-10-24 NOTE — Progress Notes (Signed)
eLink Physician-Brief Progress Note Patient Name: [name] DOB: [birth year] MRN: [id number]   Date of Service  10/24/2013  HPI/Events of Note   Lab Results  Component Value Date   INR 1.90* 10/23/2013   INR 2.28* 10/23/2013   INR 2.64* 10/22/2013   PROTIME 42.0* 09/26/2013   PROTIME 31.2* 07/25/2013   PROTIME 39.6* 06/13/2013   Goal INR < 1.7  eICU Interventions  Will give 1 unit FFP     Intervention Category Major Interventions: Other:  Cadience Bradfield 10/24/2013, 1:33 AM

## 2013-10-24 NOTE — Progress Notes (Signed)
PROGRESS NOTE  [name] RWE:[id number] DOB: 05-08-1927 DOA: 10/19/2013 PCP: Thurman Coyer, MD  Brief history  80 year old male with a history of MDS-- refractory anemia (follows Ennever) on Aranesp, third-degree heart block s/p PPM, cardiomyopathy with EF 25-30%, permanent atrial fibrillation, and COPD presents with one-day history of right flank pain extending from the patient's right-sided chest to right upper abdomen which began approximately 12-24 hours prior to admission. The patient denies any recent injury or trauma. Incidentally, the patient saw his primary care provider approximately 2 days prior to this admission. There was concern for bronchitis and the patient was started on cephalexin and prednisone. The patient did not clinically improve and continued to have fevers. CT of the chest at time of admission noted an airspace opacity in the posterior portion of the right lower lobe. The patient was treated for pneumonia, but he continued to have fever. CT of the abdomen with renal stone protocol revealed a nonobstructing stone in the left kidney and small right pleural effusion. Both previous CTs revealed a left hepatic lobe hypoattenuating lesion. Unfortunately, MRI could not be performed to clarify the hepatic lesion due to the patient's PPM. After discussion with interventional radiology it was determined that there were no other noninvasive radiographic studies which were clarify the etiology of the patient's liver mass. After Discussion with the patient's family and nephrology, it was agreed to pursue CT of the Liver with and without contrast. Dedicated CT of the liver with and without contrast suggested a hemangioma picture, but no mention of possible increase of right pleural effusion.  On 10/23/2013, the patient complained of slight increase in shortness of breath. Dedicated CT of the chest without contrast suggested a masslike area RML with enlarging right pleural  effusion. As a result, pulmonary consultation was obtained. In addition, the patient continued to have small amount hemoptysis.  Assessment/Plan:  Sepsis  -Present at the time of admission with leukocytosis, fever  -Secondary to pneumonia  -Appears to have failed outpatient therapy  -Follow blood cultures--neg to date  -Leukocytosis is probably attributable to recent prednisone in the outpatient setting  -WBC now improving  R-pleural effusion/hemoptysis  -10/23/2013--patient complained of slight increasing dypsnea on exertion  -10/23/13--CT chest--RML 3.7x3.6cm mass like area with increasing R-pleural effusion  -With the patient's hemoptysis and CT findings, pulmonary consultation was obtained  -D/C Warfarin -10/24/13--bedside thoracocentesis--1.3L removed--821 WBC -appreciate pulmonary-->may still need decortication community acquired pneumonia  -Continue ceftriaxone that was started in the emergency department  -d/c azithromycin (had 6 days) -Urine Legionella antigen--neg  -urine Streptococcus pneumoniae antigen--neg  -Although chest x-ray did not reveal gross consolidation, 10/01--CT of the chest revealed a posterior right lower lobe opacity  -no fever x 48 hrs Persistent Fever/Liver Mass  -fever has improved  -Patient cannot have MRI secondary to PPM  -I have discussed with radiology, Dr. Vernard Gambles who has stated that there are no other noninvasive radiographic studies which will help clarify the etiology of the patient's liver mass  -CT of the liver with and without contrast were clarify etiology and therefore avoid biopsy of the liver if this was consistent with an hemangioma  -Pursuing liver biopsy at this time without further clarification with that the patient had significant bleeding risk if this was a hemangioma  -Unfortunately, there are no other radiographic other diagnostic modalities which will help clarify the patient's liver mass at this time  -I have discussed extensively  with the patient and his  family the risks involved with CT of the abdomen/liver with intravenous contrast including but not limited to progressive renal failure--they have expressed understanding and wish to pursue CT of Liver with contrast  -As discussed, there are no other diagnostic modalities to help clarify the patient's liver mass  -10/22/13--CT reveals liver mass likely hemangioma-->no further workup  -Renal function remains stable >48hrs after contrasted CT of liver -restart home lasix in am 10/25/13 if renal function stable Right chest pain/right flank pain  -May be related to RLL PNA  -antitussive  -cycle troponins--neg  Permanent atrial fibrillation  -D/c warfarin for now in setting of hemoptysis  -Mildly supratherapeutic INR likely due to recent antibiotics  Cardiomyopathy  -Echocardiogram 02/06/2013 showed EF 25-30%  -Presently euvolemic  Complete heart block  -s/p PPM  CKD stage III  -baseline creatinine 1.6-1.8  -pt already given 1.5L NS, will not give any additional as pt taking good po  -Start gentle hydration in preparation for the patient's contrasted CT  -Will avoid any nephrotoxic agents and hemodynamic instability  -Renal function remains stable >48hrs after contrasted CT of liver -restart home lasix in am 10/25/13 if renal function stable Family Communication: Family updated at bedside, total time 57min, >50% spent counseling and coodinating care  Disposition Plan: Home when medically stable  Antibiotics:  Ceftriaxone 10/19/13>>>  Azithromycin 10/19/13>>>     Procedures/Studies: Ct Abdomen Pelvis W Wo Contrast  10/22/2013   CLINICAL DATA:  Liver lesion on prior stone study.  EXAM: CT ABDOMEN AND PELVIS WITHOUT AND WITH CONTRAST  TECHNIQUE: Multidetector CT imaging of the abdomen and pelvis was performed following the standard protocol before and following the bolus administration of intravenous contrast.  CONTRAST:  58mL OMNIPAQUE IOHEXOL 300 MG/ML  SOLN   COMPARISON:  CT of 10/19/2013.  Renal Ultrasound 10/05/2012.  FINDINGS: Lower chest: Increased right base airspace disease. Moderate cardiomegaly with a dual lead pacer. A small to moderate right pleural effusion is slightly increased.  Hepatobiliary: Lesion straddling the medial and lateral segments of the left lobe of the liver is again identified. This measures 3.5 x 4.2 cm. The arterial phase images are suboptimal secondary to bolus timing and poor parenchymal opacification. Portal venous phase images demonstrate a suggestion of peripheral nodular enhancement, including on images 19 and 20 of series 4. Mild contrast fill on image 19 of series 5.  Normal gallbladder, without biliary ductal dilatation.  Pancreas: Normal, without mass or pancreatic ductal dilatation.  Spleen: Normal  Adrenals/Urinary Tract: Normal adrenal glands. Lower pole left renal calculus of 7 mm. A right lower pole renal cysts. Upper pole right renal scarring. No hydronephrosis. Normal urinary bladder.  Stomach/Bowel: Normal stomach, without wall thickening. Colonic diverticulosis with muscular hypertrophy involving the sigmoid. Normal terminal ileum and appendix. Normal small bowel without abdominal ascites.  Vascular/Lymphatic: Aortic and branch vessel atherosclerosis. No retroperitoneal or retrocrural adenopathy. No pelvic adenopathy.  Reproductive:  Moderate prostatomegaly.  Other: No significant free fluid. Suspect prior right inguinal hernia repair.  Musculoskeletal:  Osteopenia.  IMPRESSION: 1. Mild degradation secondary to poor arterial contrast opacification. 2. Left liver lobe lesion which is strongly favored to represent a hemangioma. Given mild limitations of the current exam, ultrasound could be performed at 6 months to confirm size stability. 3. Increase in right pleural effusion with adjacent collapse/consolidative change. This could represent atelectasis or developing infection. 4. Left nephrolithiasis. 5. Prostatomegaly.    Electronically Signed   By: Abigail Miyamoto M.D.   On: 10/22/2013 10:43   Dg Chest  2 View  10/24/2013   CLINICAL DATA:  Pleural effusion post RIGHT thoracentesis  EXAM: CHEST  2 VIEW  COMPARISON:  10/19/2013 chest radiograph, CT chest 10/23/2013  FINDINGS: LEFT subclavian pacemaker leads project over RIGHT ventricle and coronary sinus.  Enlargement of cardiac silhouette.  Atherosclerotic calcification of a mildly tortuous thoracic aorta.  RIGHT pleural effusion and basilar atelectasis.  Fluid at RIGHT major fissure.  Suspected mass at inferior RIGHT hemi thorax on prior CT not adequately visualized.  Lungs otherwise clear.  No pneumothorax.  Bones demineralized.  IMPRESSION: No pneumothorax.  Small RIGHT pleural effusion and RIGHT basilar atelectasis.  Enlargement of cardiac silhouette post pacemaker.   Electronically Signed   By: Lavonia Dana M.D.   On: 10/24/2013 14:35   Dg Chest 2 View  10/19/2013   CLINICAL DATA:  One day history of chest pain with cough and fever  EXAM: CHEST  2 VIEW  COMPARISON:  June 23, 2010  FINDINGS: There is bibasilar lung scarring. There is no appreciable edema or consolidation. Heart is upper normal in size with pulmonary vascularity within normal limits. No adenopathy. Pacemaker leads positions are stable compared to prior study. There is degenerative change in the thoracic spine.  IMPRESSION: Bibasilar scarring. No edema or consolidation. No change in cardiac silhouette.   Electronically Signed   By: Lowella Grip M.D.   On: 10/19/2013 12:20   Ct Chest Wo Contrast  10/23/2013   CLINICAL DATA:  Subsequent evaluation of 80 year old male with pneumonia and worsening hypoxemia. Evaluate for worsening pleural effusion, potential empyema. Cough and hemoptysis with shortness of breath and low-grade fever. History of myelodysplastic syndrome.  EXAM: CT CHEST WITHOUT CONTRAST  TECHNIQUE: Multidetector CT imaging of the chest was performed following the standard protocol without IV  contrast.  COMPARISON:  Chest CT 10/19/2013.  FINDINGS: Mediastinum: Heart size is mildly enlarged with biatrial dilatation. Small amount of pericardial fluid and/or thickening, unlikely to be of hemodynamic significance at this time. There is atherosclerosis of the thoracic aorta, the great vessels of the mediastinum and the coronary arteries, including calcified atherosclerotic plaque in the left main, left anterior descending, left circumflex and right coronary arteries. Biventricular pacemaker device in position with lead tips terminating in the right ventricular apex and overlying the left ventricular anterior wall via the coronary sinus and coronary veins. Multiple borderline enlarged mediastinal lymph nodes, as well as a mildly enlarged subcarinal node measuring 11 mm in short axis. Right hilar nodal tissue appears prominent, but is difficult to separate from adjacent vasculature given the lack of IV contrast on today's examination. Esophagus is unremarkable in appearance.  Lungs/Pleura: Masslike area in the medial aspect of the right middle lobe (image 49 of series 2) measuring 3.7 x 3.6 cm may simply represent mass like airspace consolidation, however, the possibility of underlying neoplasm is not excluded. In the adjacent right lower lobe lung parenchyma there is extensive ground-glass attenuation, septal thickening, multifocal nodularity and some atelectasis, all of which has increased compared to the prior study. Enlarging moderate right-sided pleural effusion, generally layering dependently, although some of this appears partially loculated in the apex of the right hemithorax.  Upper Abdomen: Again noted is a 3.2 x 4.6 cm intermediate attenuation lesion in the central aspect of the left lobe of the liver (segments 2 and 4A), recently characterized as a cavernous hemangioma.  Musculoskeletal: There are no aggressive appearing lytic or blastic lesions noted in the visualized portions of the skeleton.   IMPRESSION: 1. Although the  findings in the right lower lobe may simply reflect a right lower lobe pneumonia, the increasingly mass like appearance in the medial aspect of the right lower lobe is concerning for potential neoplasm, and the adjacent changes in the right lower lobe could reflect worsening postobstructive pneumonitis/pneumonia. Clinical correlation is recommended. Given the enlarging partially loculated right-sided pleural effusion, sampling of the pleural fluid to exclude neoplastic cells, and to assess for potential empyema is suggested. If suspicious cells are identified by thoracentesis, further evaluation with PET-CT would be recommended. 2. Mildly enlarged subcarinal lymph node, and prominent nodal tissue in the right hilar region. While these findings could certainly be reactive in the setting of infection, underlying neoplasm is not excluded. 3. Atherosclerosis, including left main and 3 vessel coronary artery disease. 4. Additional findings, as above.   Electronically Signed   By: Vinnie Langton M.D.   On: 10/23/2013 09:29   Ct Chest Wo Contrast  10/19/2013   CLINICAL DATA:  Fever, right posterior back pain.  EXAM: CT CHEST WITHOUT CONTRAST  TECHNIQUE: Multidetector CT imaging of the chest was performed following the standard protocol without IV contrast.  COMPARISON:  Radiograph of same day.  FINDINGS: No pneumothorax is noted. Mild right pleural effusion is noted. Mild airspace opacity is noted medially in the posterior portion the right lower lobe consistent with subsegmental atelectasis or possibly pneumonia. Mild cardiomegaly is noted. Left-sided pacemaker is noted with leads in grossly good position. Anterior osteophyte formation is seen in the lower thoracic spine. No mediastinal mass or adenopathy is noted. Coronary artery calcifications are noted. 4.4 x 3.2 cm low density is noted in the superior portion of the liver with Hounsfield measurement of 32. Further evaluation with MRI  is recommended.  IMPRESSION: Mild right pleural effusion.  Airspace opacity is seen medially in posterior portion of right lower lobe consistent with pneumonia or subsegmental atelectasis.  4.4 cm low density is noted in the superior portion the liver which is not diagnostic for cyst and concerning for possible neoplasm. Further evaluation with MRI is recommended.   Electronically Signed   By: Sabino Dick M.D.   On: 10/19/2013 15:19   Dg Foot 2 Views Right  10/10/2013   View right indicates plate across the first metatarsal secondary to fusion  of the first metatarsal hallux with and he long gaited second toe and  pressure occurring with swelling of the interphalangeal joint but no  indication of stress fracture  Ct Renal Stone Study  10/19/2013   CLINICAL DATA:  Right flank pain beginning last night.  EXAM: CT ABDOMEN AND PELVIS WITHOUT CONTRAST  TECHNIQUE: Multidetector CT imaging of the abdomen and pelvis was performed following the standard protocol without IV contrast.  COMPARISON:  None.  FINDINGS: The patient has a small right pleural effusion but there is no left pleural effusions. Small pericardial effusion is identified. There is cardiomegaly.  The adrenal glands, spleen, pancreas and biliary tree are unremarkable. The gallbladder appears normal. There is a hypo attenuating lesion in the left hepatic lobe measuring 2.7 x 3.7 cm on image 19. No lymphadenopathy or fluid is identified. Colonic diverticulosis is worst in the sigmoid but there is no diverticulitis. The colon is otherwise unremarkable. The stomach and small bowel appear normal. Extensive aortoiliac atherosclerosis without aneurysm is seen. No lytic or sclerotic bony lesion is identified.  IMPRESSION: Nonspecific hypo attenuating lesion left hepatic lobe could be a benign entity such as a hemangioma but may represent primary or metastatic neoplasm. MRI  of the liver without without contrast is recommended for further evaluation.  Small  right pleural effusion and small pericardial effusion.  Nonobstructing stone lower pole left kidney.  Diverticulosis without diverticulitis.   Electronically Signed   By: Inge Rise M.D.   On: 10/19/2013 14:19         Subjective: Patient is breathing better after thoracocentesis. Denies fevers, chills, chest pain, shortness breath, nausea, vomiting, diarrhea, abdominal pain.  Objective: Filed Vitals:   10/24/13 1015 10/24/13 1047 10/24/13 1152 10/24/13 1500  BP: 134/59 129/62 128/69 146/59  Pulse: 87 82 81 81  Temp: 98.9 F (37.2 C) 98.6 F (37 C) 98.7 F (37.1 C) 99.3 F (37.4 C)  TempSrc: Oral Oral Oral Oral  Resp: 16 18 16 18   Height:      Weight:      SpO2: 94% 95% 95% 96%    Intake/Output Summary (Last 24 hours) at 10/24/13 1913 Last data filed at 10/24/13 1700  Gross per 24 hour  Intake 926.42 ml  Output   1100 ml  Net -173.58 ml   Weight change: -1.585 kg (-3 lb 7.9 oz) Exam:   General:  Pt is alert, follows commands appropriately, not in acute distress  HEENT: No icterus, No thrush, No neck mass, [place]/AT  Cardiovascular: RRR, S1/S2, no rubs, no gallops  Respiratory: Right basilar crackles. Left clear to auscultation the  Abdomen: Soft/+BS, non tender, non distended, no guarding  Extremities: 1+LE edema, No lymphangitis, No petechiae, No rashes, no synovitis  Data Reviewed: Basic Metabolic Panel:  Recent Labs Lab 10/20/13 0240 10/21/13 0415 10/22/13 0441 10/23/13 0445 10/24/13 0420  NA 131* 136* 137 138 140  K 4.3 3.8 4.0 4.3 4.3  CL 97 101 103 105 106  CO2 23 24 22 21 22   GLUCOSE 126* 117* 110* 113* 106*  BUN 31* 36* 30* 28* 26*  CREATININE 1.89* 1.97* 1.69* 1.64* 1.60*  CALCIUM 8.3* 8.8 8.7 8.8 9.3   Liver Function Tests:  Recent Labs Lab 10/19/13 1345 10/20/13 0240 10/21/13 0415 10/24/13 0420  AST 28 23 17   --   ALT 33 29 24  --   ALKPHOS 76 72 80  --   BILITOT 1.6* 1.3* 1.1  --   PROT 7.0 6.2 6.1 6.6  ALBUMIN 3.4* 2.9*  2.7*  --    No results found for this basename: LIPASE, AMYLASE,  in the last 168 hours No results found for this basename: AMMONIA,  in the last 168 hours CBC:  Recent Labs Lab 10/19/13 1345 10/20/13 0240 10/21/13 0415 10/22/13 0441 10/23/13 0445 10/24/13 0420  WBC 21.6* 21.3* 14.0* 10.7* 11.1* 11.7*  NEUTROABS 17.3* 14.0*  --   --   --   --   HGB 10.3* 9.3* 9.3* 9.4* 9.2* 9.2*  HCT 30.7* 28.1* 27.3* 28.0* 28.5* 27.4*  MCV 95.9 95.6 94.1 94.6 96.3 93.8  PLT 124* 114* 130* 148* 161 180   Cardiac Enzymes:  Recent Labs Lab 10/19/13 2050 10/20/13 0240 10/20/13 0810  TROPONINI <0.30 <0.30 <0.30   BNP: No components found with this basename: POCBNP,  CBG: No results found for this basename: GLUCAP,  in the last 168 hours  Recent Results (from the past 240 hour(s))  CULTURE, BLOOD (ROUTINE X 2)     Status: None   Collection Time    10/19/13  3:00 PM      Result Value Ref Range Status   Specimen Description BLOOD RIGHT [facility]   Final   Special Requests  Final   Value: Normal BOTTLES DRAWN AEROBIC AND ANAEROBIC 5CC EACH   Culture  Setup Time     Final   Value: 10/19/2013 20:30     Performed at [facility]   Culture     Final   Value:        BLOOD CULTURE RECEIVED NO GROWTH TO DATE CULTURE WILL BE HELD FOR 5 DAYS BEFORE ISSUING A FINAL NEGATIVE REPORT     Performed at [facility]   Report Status PENDING   Incomplete  CULTURE, BLOOD (ROUTINE X 2)     Status: None   Collection Time    10/19/13  3:15 PM      Result Value Ref Range Status   Specimen Description BLOOD RIGHT HAND   Final   Special Requests     Final   Value: Normal BOTTLES DRAWN AEROBIC AND ANAEROBIC 5CC EACH   Culture  Setup Time     Final   Value: 10/19/2013 20:29     Performed at [facility]   Culture     Final   Value:        BLOOD CULTURE RECEIVED NO GROWTH TO DATE CULTURE WILL BE HELD FOR 5 DAYS BEFORE ISSUING A FINAL NEGATIVE REPORT     Performed at [facility]     Report Status PENDING   Incomplete  BODY FLUID CULTURE     Status: None   Collection Time    10/24/13 11:50 AM      Result Value Ref Range Status   Specimen Description LUNG   Final   Special Requests Normal   Final   Gram Stain     Final   Value: NO WBC SEEN     NO SQUAMOUS EPITHELIAL CELLS SEEN     NO ORGANISMS SEEN     Performed at [facility]   Culture PENDING   Incomplete   Report Status PENDING   Incomplete  FUNGUS CULTURE W SMEAR     Status: None   Collection Time    10/24/13 11:50 AM      Result Value Ref Range Status   Specimen Description PLEURAL   Final   Special Requests Normal   Final   Fungal Smear     Final   Value: NO YEAST OR FUNGAL ELEMENTS SEEN     Performed at [facility]   Culture     Final   Value: CULTURE IN PROGRESS FOR FOUR WEEKS     Performed at [facility]   Report Status PENDING   Incomplete     Scheduled Meds: . sodium chloride   Intravenous Once  . alfuzosin  10 mg Oral Daily  . allopurinol  300 mg Oral Daily  . azithromycin  500 mg Oral Daily  . cefTRIAXone (ROCEPHIN)  IV  1 g Intravenous Q24H  . famotidine  20 mg Oral QHS  . finasteride  5 mg Oral QPM  . multivitamin with minerals  1 tablet Oral Daily  . pyridoxine  100 mg Oral BID  . sodium chloride  3 mL Intravenous Q12H   Continuous Infusions:    Cabe Lashley, DO  [facility] Pager [phone]  If 7PM-7AM, please contact night-coverage [email or web address] Password [email or web address] 10/24/2013, 7:13 PM   LOS: 5 days

## 2013-10-24 NOTE — Progress Notes (Signed)
[facility]  S: Feels OK, still short of breath O: Filed Vitals:   10/24/13 0415 10/24/13 0445 10/24/13 0632 10/24/13 0930  BP: 153/60 135/63 149/62 155/61  Pulse: 80 80  86  Temp: 98.8 F (37.1 C) 98.8 F (37.1 C) 98.3 F (36.8 C) 98.4 F (36.9 C)  TempSrc: Oral Oral Oral Oral  Resp: 20 20 20 18   Height:      Weight: 97.115 kg (214 lb 1.6 oz)     SpO2: 94% 94% 94% 94%   RA  Gen: comfortable in bed HEENT: NCAT, EOMi PULM: diminished R base, otherwise clear CV: RRR on my exam Ab: BS+, soft, nontender Ext: warm, no edema Neuro: A&Ox4, maew  Labs reviewed, INR 1.82  Impression: 1) CAP 2) parapneumonic effusion > loculated 3) Coagulopathy from warfarin  Plan: 1) thoracentesis today> will send diagnostic studies; if exudative will consult thoracic given muli-loculated appearance 2) Continue antibiotics as you are doing  Roselie Awkward, MD [place] [facility] Pager: [phone] Cell: [phone] If no response, call [phone]

## 2013-10-24 NOTE — Procedures (Signed)
Thoracentesis Procedure Note  Pre-operative Diagnosis: Parapneumonic effusion on right  Post-operative Diagnosis: same  Indications: shortness of breath  Procedure Details  Consent: Informed consent was obtained. Risks of the procedure were discussed including: infection, bleeding, pain, pneumothorax.  Under sterile conditions the patient was positioned. Betadine solution and sterile drapes were utilized.  1% buffered lidocaine was used to anesthetize the 10th rib space. Fluid was obtained without any difficulties and minimal blood loss.  A dressing was applied to the wound and wound care instructions were provided.   Findings 1350 ml of cloudy pleural fluid was obtained. A sample was sent to Pathology for cytogenetics, flow, and cell counts, as well as for infection analysis.  Complications:  None; patient tolerated the procedure well.          Condition: stable  Plan A follow up chest x-ray was ordered. Bed Rest for 0 hours. Tylenol 650 mg. for pain.  Performed by Marni Griffon NP.  Attending Attestation: I was present and scrubbed for the entire procedure.  Roselie Awkward, MD [place] [facility] Pager: [phone] Cell: [phone] If no response, call [phone]

## 2013-10-24 NOTE — Progress Notes (Signed)
Report from [place], [place] in ED. States pt will get cxr then will send to unit.

## 2013-10-25 ENCOUNTER — Encounter (HOSPITAL_COMMUNITY): Payer: Self-pay | Admitting: Radiology

## 2013-10-25 ENCOUNTER — Inpatient Hospital Stay (HOSPITAL_COMMUNITY): Payer: Medicare Other

## 2013-10-25 DIAGNOSIS — I442 Atrioventricular block, complete: Secondary | ICD-10-CM

## 2013-10-25 LAB — CULTURE, BLOOD (ROUTINE X 2)
CULTURE: NO GROWTH
Culture: NO GROWTH
SPECIAL REQUESTS: NORMAL
Special Requests: NORMAL

## 2013-10-25 LAB — PROTIME-INR
INR: 1.88 — ABNORMAL HIGH (ref 0.00–1.49)
PROTHROMBIN TIME: 21.7 s — AB (ref 11.6–15.2)

## 2013-10-25 LAB — PREPARE FRESH FROZEN PLASMA
Unit division: 0
Unit division: 0

## 2013-10-25 LAB — BASIC METABOLIC PANEL
ANION GAP: 11 (ref 5–15)
BUN: 25 mg/dL — ABNORMAL HIGH (ref 6–23)
CALCIUM: 9.1 mg/dL (ref 8.4–10.5)
CO2: 24 meq/L (ref 19–32)
CREATININE: 1.57 mg/dL — AB (ref 0.50–1.35)
Chloride: 105 mEq/L (ref 96–112)
GFR calc Af Amer: 45 mL/min — ABNORMAL LOW (ref >90)
GFR, EST NON AFRICAN AMERICAN: 39 mL/min — AB (ref >90)
Glucose, Bld: 116 mg/dL — ABNORMAL HIGH (ref 70–99)
Potassium: 4.1 mEq/L (ref 3.7–5.3)
Sodium: 140 mEq/L (ref 137–147)

## 2013-10-25 LAB — CBC
HCT: 27 % — ABNORMAL LOW (ref 39.0–52.0)
Hemoglobin: 8.8 g/dL — ABNORMAL LOW (ref 13.0–17.0)
MCH: 31.5 pg (ref 26.0–34.0)
MCHC: 32.6 g/dL (ref 30.0–36.0)
MCV: 96.8 fL (ref 78.0–100.0)
PLATELETS: 159 10*3/uL (ref 150–400)
RBC: 2.79 MIL/uL — AB (ref 4.22–5.81)
RDW: 15.5 % (ref 11.5–15.5)
WBC: 11.2 10*3/uL — ABNORMAL HIGH (ref 4.0–10.5)

## 2013-10-25 MED ORDER — FUROSEMIDE 40 MG PO TABS
40.0000 mg | ORAL_TABLET | Freq: Every day | ORAL | Status: DC
  Administered 2013-10-25 – 2013-10-26 (×2): 40 mg via ORAL
  Filled 2013-10-25 (×2): qty 1

## 2013-10-25 NOTE — Progress Notes (Signed)
[facility]  S: Feels better today, still some mild shortness of breath, has been up walking around O: Filed Vitals:   10/24/13 1500 10/24/13 2135 10/25/13 0002 10/25/13 0420  BP: 146/59 115/56  148/68  Pulse: 81 84  93  Temp: 99.3 F (37.4 C) 100.2 F (37.9 C) 98.7 F (37.1 C) 97.6 F (36.4 C)  TempSrc: Oral Oral Oral Oral  Resp: 18 18  18   Height:      Weight:    95.21 kg (209 lb 14.4 oz)  SpO2: 96% 95%  97%   RA  Gen: comfortable in chair HEENT: NCAT, EOMi PULM: Improved air movement on R, CTA left CV: RRR on my exam Ab: BS+, soft, nontender Ext: warm, no edema Neuro: A&Ox4, maew  Pleural fluid: LDH 591 Protein 3.6 WBC 821 (seg 55, L 31, Mono 12)  pH 8.0 Gram stain : negative  Impression: 1) CAP 2) parapneumonic effusion> clearly exudative, but suprisingly free flowing on ultrasound, 1.3 L drained 10/6; by lab analysis not complicated effusion necessitating chest tube drainage 3) Coagulopathy from warfarin 4) R lung mass? > I think that this was most likely consolidation with compressive atelectasis and not a mass  Plan: 1) Repeat CT chest to evaluate "mass" and if there is residual pleural effusion > may need VATS 2) f/u fluid culture and cytology 3) Continue antibiotics as you are doing  Roselie Awkward, MD [facility] Pager: [phone] Cell: [phone] If no response, call [phone]

## 2013-10-25 NOTE — Progress Notes (Signed)
PROGRESS NOTE  [name] GUR:[id number] DOB: [birth year] DOA: 10/19/2013 PCP: Thurman Coyer, MD  Brief history  80 year old male with a history of MDS-- refractory anemia (follows [facility]) on Aranesp, third-degree heart block s/p PPM, cardiomyopathy with EF 25-30%, permanent atrial fibrillation, and COPD presents with one-day history of right flank pain extending from the patient's right-sided chest to right upper abdomen which began approximately 12-24 hours prior to admission. The patient denies any recent injury or trauma. Incidentally, the patient saw his primary care provider approximately 2 days prior to this admission. There was concern for bronchitis and the patient was started on cephalexin and prednisone. The patient did not clinically improve and continued to have fevers. CT of the chest at time of admission noted an airspace opacity in the posterior portion of the right lower lobe. The patient was treated for pneumonia, but he continued to have fever. CT of the abdomen with renal stone protocol revealed a nonobstructing stone in the left kidney and small right pleural effusion. Both previous CTs revealed a left hepatic lobe hypoattenuating lesion. Unfortunately, MRI could not be performed to clarify the hepatic lesion due to the patient's PPM. After discussion with interventional radiology it was determined that there were no other noninvasive radiographic studies which were clarify the etiology of the patient's liver mass. After Discussion with the patient's family and nephrology, it was agreed to pursue CT of the Liver with and without contrast. Dedicated CT of the liver with and without contrast suggested a hemangioma picture, but no mention of possible increase of right pleural effusion.  On 10/23/2013, the patient complained of slight increase in shortness of breath. Dedicated CT of the chest without contrast suggested a masslike area RML with enlarging right pleural  effusion. As a result, pulmonary consultation was obtained. In addition, the patient continued to have small amount hemoptysis.   Assessment /Plan  Sepsis  -Presented  at the time of admission with leukocytosis, fever  -Secondary to pneumonia  -Appears to have failed outpatient therapy  -Follow blood cultures--neg to date  -Leukocytosis is probably attributable to recent prednisone in the outpatient setting  -WBC now improving   R-pleural effusion/hemoptysis  -10/23/2013--patient complained of slight increasing dypsnea on exertion  -10/23/13--CT chest--RML 3.7x3.6cm mass like area with increasing R-pleural effusion  -With the patient's hemoptysis and CT findings, pulmonary consultation was obtained  -D/C Warfarin -10/24/13--bedside thoracocentesis--1.3L removed--821 WBC -appreciate pulmonary-->may still need decortication - CT Chest ordered/  community acquired pneumonia  -Continue ceftriaxone that was started in the emergency department  -d/c azithromycin (had 6 days) -Urine Legionella antigen--neg  -urine Streptococcus pneumoniae antigen--neg  -Although chest x-ray did not reveal gross consolidation, 10/01--CT of the chest revealed a posterior right lower lobe opacity  -no fever x 48 hrs  Persistent Fever/Liver Mass  -fever has improved  -Patient cannot have MRI secondary to PPM  -I have discussed with radiology, Dr. Vernard Gambles who has stated that there are no other noninvasive radiographic studies which will help clarify the etiology of the patient's liver mass  -CT of the liver with and without contrast were clarify etiology and therefore avoid biopsy of the liver if this was consistent with an hemangioma  -Pursuing liver biopsy at this time without further clarification with that the patient had significant bleeding risk if this was a hemangioma  -Unfortunately, there are no other radiographic other diagnostic modalities which will help clarify the patient's liver mass at this time   -  I have discussed extensively with the patient and his family the risks involved with CT of the abdomen/liver with intravenous contrast including but not limited to progressive renal failure--they have expressed understanding and wish to pursue CT of Liver with contrast  -As discussed, there are no other diagnostic modalities to help clarify the patient's liver mass  -10/22/13--CT reveals liver mass likely hemangioma-->no further workup  -Renal function remains stable >48hrs after contrasted CT of liver -restart home lasix   Right chest pain/right flank pain  -May be related to RLL PNA  -antitussive  -cycle troponins--neg   Permanent atrial fibrillation  -D/c warfarin for now in setting of hemoptysis  -Mildly supratherapeutic INR likely due to recent antibiotics   Cardiomyopathy  -Echocardiogram 02/06/2013 showed EF 25-30%  -Presently euvolemic   Complete heart block  -s/p PPM   CKD stage III  -baseline creatinine 1.6-1.8  -pt already given 1.5L NS, will not give any additional as pt taking good po  -Start gentle hydration in preparation for the patient's contrasted CT  -Will avoid any nephrotoxic agents and hemodynamic instability  -Renal function remains stable >48hrs after contrasted CT of liver -Will restart the home dose of lasix.  Family Communication: Family updated at bedside, total time 85min, >50% spent counseling and coodinating care  Disposition Plan: Home when medically stable  Antibiotics:  Ceftriaxone 10/19/13>>>  Azithromycin 10/19/13>>>     Procedures/Studies: Ct Abdomen Pelvis W Wo Contrast  10/22/2013   CLINICAL DATA:  Liver lesion on prior stone study.  EXAM: CT ABDOMEN AND PELVIS WITHOUT AND WITH CONTRAST  TECHNIQUE: Multidetector CT imaging of the abdomen and pelvis was performed following the standard protocol before and following the bolus administration of intravenous contrast.  CONTRAST:  64mL OMNIPAQUE IOHEXOL 300 MG/ML  SOLN  COMPARISON:  CT of  10/19/2013.  Renal Ultrasound 10/05/2012.  FINDINGS: Lower chest: Increased right base airspace disease. Moderate cardiomegaly with a dual lead pacer. A small to moderate right pleural effusion is slightly increased.  Hepatobiliary: Lesion straddling the medial and lateral segments of the left lobe of the liver is again identified. This measures 3.5 x 4.2 cm. The arterial phase images are suboptimal secondary to bolus timing and poor parenchymal opacification. Portal venous phase images demonstrate a suggestion of peripheral nodular enhancement, including on images 19 and 20 of series 4. Mild contrast fill on image 19 of series 5.  Normal gallbladder, without biliary ductal dilatation.  Pancreas: Normal, without mass or pancreatic ductal dilatation.  Spleen: Normal  Adrenals/Urinary Tract: Normal adrenal glands. Lower pole left renal calculus of 7 mm. A right lower pole renal cysts. Upper pole right renal scarring. No hydronephrosis. Normal urinary bladder.  Stomach/Bowel: Normal stomach, without wall thickening. Colonic diverticulosis with muscular hypertrophy involving the sigmoid. Normal terminal ileum and appendix. Normal small bowel without abdominal ascites.  Vascular/Lymphatic: Aortic and branch vessel atherosclerosis. No retroperitoneal or retrocrural adenopathy. No pelvic adenopathy.  Reproductive:  Moderate prostatomegaly.  Other: No significant free fluid. Suspect prior right inguinal hernia repair.  Musculoskeletal:  Osteopenia.  IMPRESSION: 1. Mild degradation secondary to poor arterial contrast opacification. 2. Left liver lobe lesion which is strongly favored to represent a hemangioma. Given mild limitations of the current exam, ultrasound could be performed at 6 months to confirm size stability. 3. Increase in right pleural effusion with adjacent collapse/consolidative change. This could represent atelectasis or developing infection. 4. Left nephrolithiasis. 5. Prostatomegaly.   Electronically Signed    By: Abigail Miyamoto M.D.   On:  10/22/2013 10:43   Dg Chest 2 View  10/24/2013   CLINICAL DATA:  Pleural effusion post RIGHT thoracentesis  EXAM: CHEST  2 VIEW  COMPARISON:  10/19/2013 chest radiograph, CT chest 10/23/2013  FINDINGS: LEFT subclavian pacemaker leads project over RIGHT ventricle and coronary sinus.  Enlargement of cardiac silhouette.  Atherosclerotic calcification of a mildly tortuous thoracic aorta.  RIGHT pleural effusion and basilar atelectasis.  Fluid at RIGHT major fissure.  Suspected mass at inferior RIGHT hemi thorax on prior CT not adequately visualized.  Lungs otherwise clear.  No pneumothorax.  Bones demineralized.  IMPRESSION: No pneumothorax.  Small RIGHT pleural effusion and RIGHT basilar atelectasis.  Enlargement of cardiac silhouette post pacemaker.   Electronically Signed   By: Lavonia Dana M.D.   On: 10/24/2013 14:35   Dg Chest 2 View  10/19/2013   CLINICAL DATA:  One day history of chest pain with cough and fever  EXAM: CHEST  2 VIEW  COMPARISON:  June 23, 2010  FINDINGS: There is bibasilar lung scarring. There is no appreciable edema or consolidation. Heart is upper normal in size with pulmonary vascularity within normal limits. No adenopathy. Pacemaker leads positions are stable compared to prior study. There is degenerative change in the thoracic spine.  IMPRESSION: Bibasilar scarring. No edema or consolidation. No change in cardiac silhouette.   Electronically Signed   By: Lowella Grip M.D.   On: 10/19/2013 12:20   Ct Chest Wo Contrast  10/23/2013   CLINICAL DATA:  Subsequent evaluation of 80 year old male with pneumonia and worsening hypoxemia. Evaluate for worsening pleural effusion, potential empyema. Cough and hemoptysis with shortness of breath and low-grade fever. History of myelodysplastic syndrome.  EXAM: CT CHEST WITHOUT CONTRAST  TECHNIQUE: Multidetector CT imaging of the chest was performed following the standard protocol without IV contrast.  COMPARISON:   Chest CT 10/19/2013.  FINDINGS: Mediastinum: Heart size is mildly enlarged with biatrial dilatation. Small amount of pericardial fluid and/or thickening, unlikely to be of hemodynamic significance at this time. There is atherosclerosis of the thoracic aorta, the great vessels of the mediastinum and the coronary arteries, including calcified atherosclerotic plaque in the left main, left anterior descending, left circumflex and right coronary arteries. Biventricular pacemaker device in position with lead tips terminating in the right ventricular apex and overlying the left ventricular anterior wall via the coronary sinus and coronary veins. Multiple borderline enlarged mediastinal lymph nodes, as well as a mildly enlarged subcarinal node measuring 11 mm in short axis. Right hilar nodal tissue appears prominent, but is difficult to separate from adjacent vasculature given the lack of IV contrast on today's examination. Esophagus is unremarkable in appearance.  Lungs/Pleura: Masslike area in the medial aspect of the right middle lobe (image 49 of series 2) measuring 3.7 x 3.6 cm may simply represent mass like airspace consolidation, however, the possibility of underlying neoplasm is not excluded. In the adjacent right lower lobe lung parenchyma there is extensive ground-glass attenuation, septal thickening, multifocal nodularity and some atelectasis, all of which has increased compared to the prior study. Enlarging moderate right-sided pleural effusion, generally layering dependently, although some of this appears partially loculated in the apex of the right hemithorax.  Upper Abdomen: Again noted is a 3.2 x 4.6 cm intermediate attenuation lesion in the central aspect of the left lobe of the liver (segments 2 and 4A), recently characterized as a cavernous hemangioma.  Musculoskeletal: There are no aggressive appearing lytic or blastic lesions noted in the visualized portions of the skeleton.  IMPRESSION: 1. Although the  findings in the right lower lobe may simply reflect a right lower lobe pneumonia, the increasingly mass like appearance in the medial aspect of the right lower lobe is concerning for potential neoplasm, and the adjacent changes in the right lower lobe could reflect worsening postobstructive pneumonitis/pneumonia. Clinical correlation is recommended. Given the enlarging partially loculated right-sided pleural effusion, sampling of the pleural fluid to exclude neoplastic cells, and to assess for potential empyema is suggested. If suspicious cells are identified by thoracentesis, further evaluation with PET-CT would be recommended. 2. Mildly enlarged subcarinal lymph node, and prominent nodal tissue in the right hilar region. While these findings could certainly be reactive in the setting of infection, underlying neoplasm is not excluded. 3. Atherosclerosis, including left main and 3 vessel coronary artery disease. 4. Additional findings, as above.   Electronically Signed   By: Vinnie Langton M.D.   On: 10/23/2013 09:29   Ct Chest Wo Contrast  10/19/2013   CLINICAL DATA:  Fever, right posterior back pain.  EXAM: CT CHEST WITHOUT CONTRAST  TECHNIQUE: Multidetector CT imaging of the chest was performed following the standard protocol without IV contrast.  COMPARISON:  Radiograph of same day.  FINDINGS: No pneumothorax is noted. Mild right pleural effusion is noted. Mild airspace opacity is noted medially in the posterior portion the right lower lobe consistent with subsegmental atelectasis or possibly pneumonia. Mild cardiomegaly is noted. Left-sided pacemaker is noted with leads in grossly good position. Anterior osteophyte formation is seen in the lower thoracic spine. No mediastinal mass or adenopathy is noted. Coronary artery calcifications are noted. 4.4 x 3.2 cm low density is noted in the superior portion of the liver with Hounsfield measurement of 32. Further evaluation with MRI is recommended.  IMPRESSION:  Mild right pleural effusion.  Airspace opacity is seen medially in posterior portion of right lower lobe consistent with pneumonia or subsegmental atelectasis.  4.4 cm low density is noted in the superior portion the liver which is not diagnostic for cyst and concerning for possible neoplasm. Further evaluation with MRI is recommended.   Electronically Signed   By: Sabino Dick M.D.   On: 10/19/2013 15:19   Dg Foot 2 Views Right  10/10/2013   View right indicates plate across the first metatarsal secondary to fusion  of the first metatarsal hallux with and he long gaited second toe and  pressure occurring with swelling of the interphalangeal joint but no  indication of stress fracture  Ct Renal Stone Study  10/19/2013   CLINICAL DATA:  Right flank pain beginning last night.  EXAM: CT ABDOMEN AND PELVIS WITHOUT CONTRAST  TECHNIQUE: Multidetector CT imaging of the abdomen and pelvis was performed following the standard protocol without IV contrast.  COMPARISON:  None.  FINDINGS: The patient has a small right pleural effusion but there is no left pleural effusions. Small pericardial effusion is identified. There is cardiomegaly.  The adrenal glands, spleen, pancreas and biliary tree are unremarkable. The gallbladder appears normal. There is a hypo attenuating lesion in the left hepatic lobe measuring 2.7 x 3.7 cm on image 19. No lymphadenopathy or fluid is identified. Colonic diverticulosis is worst in the sigmoid but there is no diverticulitis. The colon is otherwise unremarkable. The stomach and small bowel appear normal. Extensive aortoiliac atherosclerosis without aneurysm is seen. No lytic or sclerotic bony lesion is identified.  IMPRESSION: Nonspecific hypo attenuating lesion left hepatic lobe could be a benign entity such as a hemangioma but may represent  primary or metastatic neoplasm. MRI of the liver without without contrast is recommended for further evaluation.  Small right pleural effusion and small  pericardial effusion.  Nonobstructing stone lower pole left kidney.  Diverticulosis without diverticulitis.   Electronically Signed   By: Inge Rise M.D.   On: 10/19/2013 14:19         Subjective: Patient is breathing better after thoracocentesis. Denies shortness of breath.  Objective: Filed Vitals:   10/24/13 2135 10/25/13 0002 10/25/13 0420 10/25/13 1400  BP: 115/56  148/68 123/59  Pulse: 84  93 83  Temp: 100.2 F (37.9 C) 98.7 F (37.1 C) 97.6 F (36.4 C) 98.3 F (36.8 C)  TempSrc: Oral Oral Oral Oral  Resp: 18  18 18   Height:      Weight:   95.21 kg (209 lb 14.4 oz)   SpO2: 95%  97% 98%    Intake/Output Summary (Last 24 hours) at 10/25/13 1447 Last data filed at 10/24/13 2000  Gross per 24 hour  Intake      0 ml  Output    450 ml  Net   -450 ml   Weight change: -1.905 kg (-4 lb 3.2 oz) Exam:  Physical Exam: Head: Normocephalic, atraumatic.  Neck: supple,No deformities, masses, or tenderness noted. Lungs: Normal respiratory effort. B/L Clear to auscultation, no crackles or wheezes.  Heart: Regular RR. S1 and S2 normal  Abdomen: BS normoactive. Soft, Nondistended, non-tender.  Extremities: No pretibial edema, no erythema   Data Reviewed: Basic Metabolic Panel:  Recent Labs Lab 10/21/13 0415 10/22/13 0441 10/23/13 0445 10/24/13 0420 10/25/13 0535  NA 136* 137 138 140 140  K 3.8 4.0 4.3 4.3 4.1  CL 101 103 105 106 105  CO2 24 22 21 22 24   GLUCOSE 117* 110* 113* 106* 116*  BUN 36* 30* 28* 26* 25*  CREATININE 1.97* 1.69* 1.64* 1.60* 1.57*  CALCIUM 8.8 8.7 8.8 9.3 9.1   Liver Function Tests:  Recent Labs Lab 10/19/13 1345 10/20/13 0240 10/21/13 0415 10/24/13 0420  AST 28 23 17   --   ALT 33 29 24  --   ALKPHOS 76 72 80  --   BILITOT 1.6* 1.3* 1.1  --   PROT 7.0 6.2 6.1 6.6  ALBUMIN 3.4* 2.9* 2.7*  --    No results found for this basename: LIPASE, AMYLASE,  in the last 168 hours No results found for this basename: AMMONIA,  in the  last 168 hours CBC:  Recent Labs Lab 10/19/13 1345 10/20/13 0240 10/21/13 0415 10/22/13 0441 10/23/13 0445 10/24/13 0420 10/25/13 0535  WBC 21.6* 21.3* 14.0* 10.7* 11.1* 11.7* 11.2*  NEUTROABS 17.3* 14.0*  --   --   --   --   --   HGB 10.3* 9.3* 9.3* 9.4* 9.2* 9.2* 8.8*  HCT 30.7* 28.1* 27.3* 28.0* 28.5* 27.4* 27.0*  MCV 95.9 95.6 94.1 94.6 96.3 93.8 96.8  PLT 124* 114* 130* 148* 161 180 159   Cardiac Enzymes:  Recent Labs Lab 10/19/13 2050 10/20/13 0240 10/20/13 0810  TROPONINI <0.30 <0.30 <0.30   BNP: No components found with this basename: POCBNP,  CBG: No results found for this basename: GLUCAP,  in the last 168 hours  Recent Results (from the past 240 hour(s))  CULTURE, BLOOD (ROUTINE X 2)     Status: None   Collection Time    10/19/13  3:00 PM      Result Value Ref Range Status   Specimen Description BLOOD RIGHT AC  Final   Special Requests     Final   Value: Normal BOTTLES DRAWN AEROBIC AND ANAEROBIC 5CC EACH   Culture  Setup Time     Final   Value: 10/19/2013 20:30     Performed at [facility]   Culture     Final   Value: NO GROWTH 5 DAYS     Performed at [facility]   Report Status 10/25/2013 FINAL   Final  CULTURE, BLOOD (ROUTINE X 2)     Status: None   Collection Time    10/19/13  3:15 PM      Result Value Ref Range Status   Specimen Description BLOOD RIGHT HAND   Final   Special Requests     Final   Value: Normal BOTTLES DRAWN AEROBIC AND ANAEROBIC 5CC EACH   Culture  Setup Time     Final   Value: 10/19/2013 20:29     Performed at [facility]   Culture     Final   Value: NO GROWTH 5 DAYS     Performed at [facility]   Report Status 10/25/2013 FINAL   Final  BODY FLUID CULTURE     Status: None   Collection Time    10/24/13 11:50 AM      Result Value Ref Range Status   Specimen Description LUNG   Final   Special Requests Normal   Final   Gram Stain     Final   Value: NO WBC SEEN     NO SQUAMOUS  EPITHELIAL CELLS SEEN     NO ORGANISMS SEEN     Performed at [facility]   Culture     Final   Value: NO GROWTH 1 DAY     Performed at [facility]   Report Status PENDING   Incomplete  FUNGUS CULTURE W SMEAR     Status: None   Collection Time    10/24/13 11:50 AM      Result Value Ref Range Status   Specimen Description PLEURAL   Final   Special Requests Normal   Final   Fungal Smear     Final   Value: NO YEAST OR FUNGAL ELEMENTS SEEN     Performed at [facility]   Culture     Final   Value: CULTURE IN PROGRESS FOR FOUR WEEKS     Performed at [facility]   Report Status PENDING   Incomplete     Scheduled Meds: . sodium chloride   Intravenous Once  . alfuzosin  10 mg Oral Daily  . allopurinol  300 mg Oral Daily  . cefTRIAXone (ROCEPHIN)  IV  1 g Intravenous Q24H  . famotidine  20 mg Oral QHS  . finasteride  5 mg Oral QPM  . multivitamin with minerals  1 tablet Oral Daily  . pyridoxine  100 mg Oral BID  . sodium chloride  3 mL Intravenous Q12H   Continuous Infusions:    Oswald Hillock, MD  [facility] Pager [phone]  If 7PM-7AM, please contact night-coverage [email or web address] Password [email or web address] 10/25/2013, 2:47 PM   LOS: 6 days

## 2013-10-26 LAB — COMPREHENSIVE METABOLIC PANEL
ALT: 69 U/L — ABNORMAL HIGH (ref 0–53)
ANION GAP: 12 (ref 5–15)
AST: 44 U/L — ABNORMAL HIGH (ref 0–37)
Albumin: 2.7 g/dL — ABNORMAL LOW (ref 3.5–5.2)
Alkaline Phosphatase: 218 U/L — ABNORMAL HIGH (ref 39–117)
BUN: 25 mg/dL — AB (ref 6–23)
CO2: 24 mEq/L (ref 19–32)
Calcium: 8.9 mg/dL (ref 8.4–10.5)
Chloride: 105 mEq/L (ref 96–112)
Creatinine, Ser: 1.57 mg/dL — ABNORMAL HIGH (ref 0.50–1.35)
GFR calc non Af Amer: 39 mL/min — ABNORMAL LOW (ref >90)
GFR, EST AFRICAN AMERICAN: 45 mL/min — AB (ref >90)
GLUCOSE: 97 mg/dL (ref 70–99)
Potassium: 3.8 mEq/L (ref 3.7–5.3)
Sodium: 141 mEq/L (ref 137–147)
Total Bilirubin: 1.1 mg/dL (ref 0.3–1.2)
Total Protein: 6.2 g/dL (ref 6.0–8.3)

## 2013-10-26 LAB — CBC
HCT: 25.8 % — ABNORMAL LOW (ref 39.0–52.0)
HEMOGLOBIN: 8.6 g/dL — AB (ref 13.0–17.0)
MCH: 31.5 pg (ref 26.0–34.0)
MCHC: 33.3 g/dL (ref 30.0–36.0)
MCV: 94.5 fL (ref 78.0–100.0)
Platelets: 171 10*3/uL (ref 150–400)
RBC: 2.73 MIL/uL — ABNORMAL LOW (ref 4.22–5.81)
RDW: 15.6 % — AB (ref 11.5–15.5)
WBC: 10 10*3/uL (ref 4.0–10.5)

## 2013-10-26 LAB — PROTIME-INR
INR: 1.68 — ABNORMAL HIGH (ref 0.00–1.49)
Prothrombin Time: 19.9 seconds — ABNORMAL HIGH (ref 11.6–15.2)

## 2013-10-26 MED ORDER — AMOXICILLIN-POT CLAVULANATE 875-125 MG PO TABS: 1.0000 | ORAL_TABLET | Freq: Two times a day (BID) | ORAL | Status: DC

## 2013-10-26 MED ORDER — HYDROCODONE-HOMATROPINE 5-1.5 MG/5ML PO SYRP: 5.0000 mL | ORAL_SOLUTION | ORAL | Status: DC | PRN

## 2013-10-26 NOTE — Discharge Summary (Signed)
Physician Discharge Summary  [name] OAC:[id number] DOB: 08-28-1927 DOA: 10/19/2013  PCP: Thurman Coyer, MD  Admit date: 10/19/2013 Discharge date: 10/26/2013  Time spent: *50 minutes  Recommendations for Outpatient Follow-up:  1. *Follow up Pulmonology on 10/29 at 11 am  Discharge Diagnoses:  Active Problems:   MDS (myelodysplastic syndrome), low grade   Complete heart block   Stage III chronic kidney disease   Permanent atrial fibrillation   CAP (community acquired pneumonia)   Sepsis   Discharge Condition: Stable  Diet recommendation: Regular diet  Filed Weights   10/25/13 0420 10/26/13 0414 10/26/13 0904  Weight: 95.21 kg (209 lb 14.4 oz) 94.1 kg (207 lb 7.3 oz) 94.8 kg (208 lb 15.9 oz)    History of present illness:  80 year old male with a history of MDS-- refractory anemia (follows [facility]) on Aranesp, third-degree heart block s/p PPM, cardiomyopathy with EF 25-30%, permanent atrial fibrillation, and COPD presents with one-day history of right flank pain extending from the patient's right-sided chest to right upper abdomen which began approximately 12-24 hours prior to admission. The patient denies any recent injury or trauma. Incidentally, the patient saw his primary care provider approximately 2 days prior to this admission. There was concern for bronchitis and the patient was started on cephalexin and prednisone. The patient did not clinically improve and continued to have fevers. CT of the chest at time of admission noted an airspace opacity in the posterior portion of the right lower lobe. The patient was treated for pneumonia, but he continued to have fever. CT of the abdomen with renal stone protocol revealed a nonobstructing stone in the left kidney and small right pleural effusion. Both previous CTs revealed a left hepatic lobe hypoattenuating lesion. Unfortunately, MRI could not be performed to clarify the hepatic lesion due to the patient's PPM. After  discussion with interventional radiology it was determined that there were no other noninvasive radiographic studies which were clarify the etiology of the patient's liver mass. After Discussion with the patient's family and nephrology, it was agreed to pursue CT of the Liver with and without contrast. Dedicated CT of the liver with and without contrast suggested a hemangioma picture, but no mention of possible increase of right pleural effusion.  On 10/23/2013, the patient complained of slight increase in shortness of breath. Dedicated CT of the chest without contrast suggested a masslike area RML with enlarging right pleural effusion. As a result, pulmonary consultation was obtained. In addition, the patient continued to have small amount hemoptysis.      Hospital Course:   Sepsis  -Presented at the time of admission with leukocytosis, fever  -Secondary to pneumonia  -Appears to have failed outpatient therapy  blood cultures--no growth in 5 days -Leukocytosis resolved.  R-pleural effusion/hemoptysis  -10/23/2013--patient complained of slight increasing dypsnea on exertion  -10/23/13--CT chest--RML 3.7x3.6cm mass like area with increasing R-pleural effusion  -With the patient's hemoptysis and CT findings, pulmonary consultation was obtained  -D/C Warfarin  -10/24/13--bedside thoracocentesis--1.3L removed--821 WBC  -appreciate pulmonary assistance - Pulmonary discussed the CT chest findings with cardiothoracic surgery, and CT surgeon does not recommend decortication at this time. Patient has improved he is not requiring oxygen. Has been ambulating in the hallway without any complaints. He will follow up with pulmonary on 11/16/2013 at 11 AM with a repeat chest x-ray.  community acquired pneumonia  -Patient was started on Rocephin and Zithromax  -Urine Legionella antigen--neg  -urine Streptococcus pneumoniae antigen--neg  -Although chest x-ray did not reveal gross  consolidation, 10/01--CT  of the chest revealed a posterior right lower lobe opacity  -no fever x 48 hrs   - He has received 7 days of antibiotics in the hospital. Will discharge him on Augmentin 1 tablet by mouth twice a day for 5 more days  Persistent Fever/Liver Mass  -fever has improved  -Patient cannot have MRI secondary to PPM  -I have discussed with radiology, Dr. Vernard Gambles who has stated that there are no other noninvasive radiographic studies which will help clarify the etiology of the patient's liver mass  -CT of the liver with and without contrast were clarify etiology and therefore avoid biopsy of the liver if this was consistent with an hemangioma  -Pursuing liver biopsy at this time without further clarification with that the patient had significant bleeding risk if this was a hemangioma  -Unfortunately, there are no other radiographic other diagnostic modalities which will help clarify the patient's liver mass at this time  -I have discussed extensively with the patient and his family the risks involved with CT of the abdomen/liver with intravenous contrast including but not limited to progressive renal failure--they have expressed understanding and wish to pursue CT of Liver with contrast  -As discussed, there are no other diagnostic modalities to help clarify the patient's liver mass  -10/22/13--CT reveals liver mass likely hemangioma-->no further workup  -Renal function remains stable >48hrs after contrasted CT of liver  -restart home lasix    Permanent atrial fibrillation  Patient has been taking Coumadin at home, INR was found to be supratherapeutic - warfarin was discontinued in the hospital due to hemoptysis  - Hemoptysis has resolved -Will restart Coumadin as outpatient. If patient starts having hemoptysis again he will call the pulmonologist and cardiologist.   Cardiomyopathy  -Echocardiogram 02/06/2013 showed EF 25-30%  -Presently euvolemic   Complete heart block  -s/p PPM   CKD stage III   -baseline creatinine 1.6-1.8  - Today creatinine is 1.57   Procedures:  Thoracentesis  Consultations: Pulmonary  Discharge Exam: Filed Vitals:   10/26/13 0904  BP: 115/53  Pulse: 80  Temp: 98.1 F (36.7 C)  Resp: 18    General: Appears in no acute distress Cardiovascular: S1-S2 regular Respiratory: *Clear bilaterally  Discharge Instructions You were cared for by a hospitalist during your hospital stay. If you have any questions about your discharge medications or the care you received while you were in the hospital after you are discharged, you can call the unit and asked to speak with the hospitalist on call if the hospitalist that took care of you is not available. Once you are discharged, your primary care physician will handle any further medical issues. Please note that NO REFILLS for any discharge medications will be authorized once you are discharged, as it is imperative that you return to your primary care physician (or establish a relationship with a primary care physician if you do not have one) for your aftercare needs so that they can reassess your need for medications and monitor your lab values.  Discharge Instructions   Diet - low sodium heart healthy    Complete by:  As directed      Increase activity slowly    Complete by:  As directed           Current Discharge Medication List    START taking these medications   Details  amoxicillin-clavulanate (AUGMENTIN) 875-125 MG per tablet Take 1 tablet by mouth 2 (two) times daily. Qty: 10 tablet, Refills: 0  HYDROcodone-homatropine (HYCODAN) 5-1.5 MG/5ML syrup Take 5 mLs by mouth every 4 (four) hours as needed for cough. Qty: 120 mL, Refills: 0      CONTINUE these medications which have NOT CHANGED   Details  alfuzosin (UROXATRAL) 10 MG 24 hr tablet Take 1 tablet by mouth daily.    allopurinol (ZYLOPRIM) 300 MG tablet Take 300 mg by mouth daily.    carvedilol (COREG) 6.25 MG tablet Take 1 tablet (6.25  mg total) by mouth 2 (two) times daily with a meal. Qty: 60 tablet, Refills: 3    famotidine (PEPCID) 20 MG tablet Take 40 mg by mouth at bedtime.     finasteride (PROSCAR) 5 MG tablet Take 5 mg by mouth every evening. 5pm    furosemide (LASIX) 40 MG tablet Take 40 mg by mouth daily.    Multiple Vitamin (MULTIVITAMIN WITH MINERALS) TABS tablet Take 1 tablet by mouth daily.    pyridoxine (B-6) 100 MG tablet Take 100 mg by mouth 2 (two) times daily.     warfarin (COUMADIN) 5 MG tablet Take 5 mg by mouth daily. As directed.       Allergies  Allergen Reactions  . Moxifloxacin Hcl In Nacl Other (See Comments) and Itching    Pt c/o being "out of his head" & itching  . Methylprednisolone Other (See Comments)    Dizziness, passed out twice  . Ramipril Other (See Comments)    Renal function worsened on ACEi.   Follow-up Information   Follow up with Simonne Maffucci, MD On 11/16/2013. (To follow up the fluid in your chest. Will have Chest X-ray; Visit is 10/29 at 11:00AM)    Specialty:  Pulmonary Disease   Contact information:   [place] [id number] [phone]        The results of significant diagnostics from this hospitalization (including imaging, microbiology, ancillary and laboratory) are listed below for reference.    Significant Diagnostic Studies: Ct Abdomen Pelvis W Wo Contrast  10/22/2013   CLINICAL DATA:  Liver lesion on prior stone study.  EXAM: CT ABDOMEN AND PELVIS WITHOUT AND WITH CONTRAST  TECHNIQUE: Multidetector CT imaging of the abdomen and pelvis was performed following the standard protocol before and following the bolus administration of intravenous contrast.  CONTRAST:  18mL OMNIPAQUE IOHEXOL 300 MG/ML  SOLN  COMPARISON:  CT of 10/19/2013.  Renal Ultrasound 10/05/2012.  FINDINGS: Lower chest: Increased right base airspace disease. Moderate cardiomegaly with a dual lead pacer. A small to moderate right pleural effusion is slightly increased.   Hepatobiliary: Lesion straddling the medial and lateral segments of the left lobe of the liver is again identified. This measures 3.5 x 4.2 cm. The arterial phase images are suboptimal secondary to bolus timing and poor parenchymal opacification. Portal venous phase images demonstrate a suggestion of peripheral nodular enhancement, including on images 19 and 20 of series 4. Mild contrast fill on image 19 of series 5.  Normal gallbladder, without biliary ductal dilatation.  Pancreas: Normal, without mass or pancreatic ductal dilatation.  Spleen: Normal  Adrenals/Urinary Tract: Normal adrenal glands. Lower pole left renal calculus of 7 mm. A right lower pole renal cysts. Upper pole right renal scarring. No hydronephrosis. Normal urinary bladder.  Stomach/Bowel: Normal stomach, without wall thickening. Colonic diverticulosis with muscular hypertrophy involving the sigmoid. Normal terminal ileum and appendix. Normal small bowel without abdominal ascites.  Vascular/Lymphatic: Aortic and branch vessel atherosclerosis. No retroperitoneal or retrocrural adenopathy. No pelvic adenopathy.  Reproductive:  Moderate prostatomegaly.  Other:  No significant free fluid. Suspect prior right inguinal hernia repair.  Musculoskeletal:  Osteopenia.  IMPRESSION: 1. Mild degradation secondary to poor arterial contrast opacification. 2. Left liver lobe lesion which is strongly favored to represent a hemangioma. Given mild limitations of the current exam, ultrasound could be performed at 6 months to confirm size stability. 3. Increase in right pleural effusion with adjacent collapse/consolidative change. This could represent atelectasis or developing infection. 4. Left nephrolithiasis. 5. Prostatomegaly.   Electronically Signed   By: Abigail Miyamoto M.D.   On: 10/22/2013 10:43   Dg Chest 2 View  10/24/2013   CLINICAL DATA:  Pleural effusion post RIGHT thoracentesis  EXAM: CHEST  2 VIEW  COMPARISON:  10/19/2013 chest radiograph, CT chest  10/23/2013  FINDINGS: LEFT subclavian pacemaker leads project over RIGHT ventricle and coronary sinus.  Enlargement of cardiac silhouette.  Atherosclerotic calcification of a mildly tortuous thoracic aorta.  RIGHT pleural effusion and basilar atelectasis.  Fluid at RIGHT major fissure.  Suspected mass at inferior RIGHT hemi thorax on prior CT not adequately visualized.  Lungs otherwise clear.  No pneumothorax.  Bones demineralized.  IMPRESSION: No pneumothorax.  Small RIGHT pleural effusion and RIGHT basilar atelectasis.  Enlargement of cardiac silhouette post pacemaker.   Electronically Signed   By: Lavonia Dana M.D.   On: 10/24/2013 14:35   Dg Chest 2 View  10/19/2013   CLINICAL DATA:  One day history of chest pain with cough and fever  EXAM: CHEST  2 VIEW  COMPARISON:  June 23, 2010  FINDINGS: There is bibasilar lung scarring. There is no appreciable edema or consolidation. Heart is upper normal in size with pulmonary vascularity within normal limits. No adenopathy. Pacemaker leads positions are stable compared to prior study. There is degenerative change in the thoracic spine.  IMPRESSION: Bibasilar scarring. No edema or consolidation. No change in cardiac silhouette.   Electronically Signed   By: Lowella Grip M.D.   On: 10/19/2013 12:20   Ct Chest Wo Contrast  10/25/2013   CLINICAL DATA:  Pneumonia with worsening hypoxemia since 10/19/2013. Status post thoracentesis. Persistent cough and hemoptysis. Low grade fever and shortness of Breath.  EXAM: CT CHEST WITHOUT CONTRAST  TECHNIQUE: Multidetector CT imaging of the chest was performed following the standard protocol without IV contrast.  COMPARISON:  10/23/2013  FINDINGS: Chest wall:  Stable left-sided permanent pacemaker. No supraclavicular or axillary mass or lymphadenopathy. The thyroid gland is grossly normal. The bony thorax is intact. No destructive bone lesions or spinal canal compromise. There are degenerative changes involving the spine.   Mediastinum:  The heart is stable in size. Stable pericardial effusion. There are stable mediastinal and hilar lymph nodes. The aorta is tortuous and demonstrates scattered atherosclerotic calcifications. No focal aneurysm. Stable coronary artery calcifications. The esophagus is grossly normal.  Lungs: Interval slight decrease in size of the right pleural effusion with persistent overlying atelectasis and superior segment right lower lobe pneumonia. No endobronchial lesion or obstruction. Moderate right-sided peribronchial thickening. The left lung remains relatively clear. No left-sided pleural effusion.  Upper abdomen: Stable medial segment left hepatic lobe mass measuring 4.3 Cm.  IMPRESSION: Overall stable CT appearance of the chest. Persistent right pleural effusion with areas of loculation. This also overlying atelectasis and infiltrate. No definite mass or endobronchial lesion.  Stable small pericardial effusion.  Stable mediastinal and hilar lymph nodes.  Stable liver lesion.   Electronically Signed   By: Kalman Jewels M.D.   On: 10/25/2013 15:10   Ct  Chest Wo Contrast  10/23/2013   CLINICAL DATA:  Subsequent evaluation of 80 year old male with pneumonia and worsening hypoxemia. Evaluate for worsening pleural effusion, potential empyema. Cough and hemoptysis with shortness of breath and low-grade fever. History of myelodysplastic syndrome.  EXAM: CT CHEST WITHOUT CONTRAST  TECHNIQUE: Multidetector CT imaging of the chest was performed following the standard protocol without IV contrast.  COMPARISON:  Chest CT 10/19/2013.  FINDINGS: Mediastinum: Heart size is mildly enlarged with biatrial dilatation. Small amount of pericardial fluid and/or thickening, unlikely to be of hemodynamic significance at this time. There is atherosclerosis of the thoracic aorta, the great vessels of the mediastinum and the coronary arteries, including calcified atherosclerotic plaque in the left main, left anterior descending,  left circumflex and right coronary arteries. Biventricular pacemaker device in position with lead tips terminating in the right ventricular apex and overlying the left ventricular anterior wall via the coronary sinus and coronary veins. Multiple borderline enlarged mediastinal lymph nodes, as well as a mildly enlarged subcarinal node measuring 11 mm in short axis. Right hilar nodal tissue appears prominent, but is difficult to separate from adjacent vasculature given the lack of IV contrast on today's examination. Esophagus is unremarkable in appearance.  Lungs/Pleura: Masslike area in the medial aspect of the right middle lobe (image 49 of series 2) measuring 3.7 x 3.6 cm may simply represent mass like airspace consolidation, however, the possibility of underlying neoplasm is not excluded. In the adjacent right lower lobe lung parenchyma there is extensive ground-glass attenuation, septal thickening, multifocal nodularity and some atelectasis, all of which has increased compared to the prior study. Enlarging moderate right-sided pleural effusion, generally layering dependently, although some of this appears partially loculated in the apex of the right hemithorax.  Upper Abdomen: Again noted is a 3.2 x 4.6 cm intermediate attenuation lesion in the central aspect of the left lobe of the liver (segments 2 and 4A), recently characterized as a cavernous hemangioma.  Musculoskeletal: There are no aggressive appearing lytic or blastic lesions noted in the visualized portions of the skeleton.  IMPRESSION: 1. Although the findings in the right lower lobe may simply reflect a right lower lobe pneumonia, the increasingly mass like appearance in the medial aspect of the right lower lobe is concerning for potential neoplasm, and the adjacent changes in the right lower lobe could reflect worsening postobstructive pneumonitis/pneumonia. Clinical correlation is recommended. Given the enlarging partially loculated right-sided  pleural effusion, sampling of the pleural fluid to exclude neoplastic cells, and to assess for potential empyema is suggested. If suspicious cells are identified by thoracentesis, further evaluation with PET-CT would be recommended. 2. Mildly enlarged subcarinal lymph node, and prominent nodal tissue in the right hilar region. While these findings could certainly be reactive in the setting of infection, underlying neoplasm is not excluded. 3. Atherosclerosis, including left main and 3 vessel coronary artery disease. 4. Additional findings, as above.   Electronically Signed   By: Vinnie Langton M.D.   On: 10/23/2013 09:29   Ct Chest Wo Contrast  10/19/2013   CLINICAL DATA:  Fever, right posterior back pain.  EXAM: CT CHEST WITHOUT CONTRAST  TECHNIQUE: Multidetector CT imaging of the chest was performed following the standard protocol without IV contrast.  COMPARISON:  Radiograph of same day.  FINDINGS: No pneumothorax is noted. Mild right pleural effusion is noted. Mild airspace opacity is noted medially in the posterior portion the right lower lobe consistent with subsegmental atelectasis or possibly pneumonia. Mild cardiomegaly is noted. Left-sided pacemaker  is noted with leads in grossly good position. Anterior osteophyte formation is seen in the lower thoracic spine. No mediastinal mass or adenopathy is noted. Coronary artery calcifications are noted. 4.4 x 3.2 cm low density is noted in the superior portion of the liver with Hounsfield measurement of 32. Further evaluation with MRI is recommended.  IMPRESSION: Mild right pleural effusion.  Airspace opacity is seen medially in posterior portion of right lower lobe consistent with pneumonia or subsegmental atelectasis.  4.4 cm low density is noted in the superior portion the liver which is not diagnostic for cyst and concerning for possible neoplasm. Further evaluation with MRI is recommended.   Electronically Signed   By: Sabino Dick M.D.   On: 10/19/2013  15:19   Dg Foot 2 Views Right  10/10/2013   View right indicates plate across the first metatarsal secondary to fusion  of the first metatarsal hallux with and he long gaited second toe and  pressure occurring with swelling of the interphalangeal joint but no  indication of stress fracture  Ct Renal Stone Study  10/19/2013   CLINICAL DATA:  Right flank pain beginning last night.  EXAM: CT ABDOMEN AND PELVIS WITHOUT CONTRAST  TECHNIQUE: Multidetector CT imaging of the abdomen and pelvis was performed following the standard protocol without IV contrast.  COMPARISON:  None.  FINDINGS: The patient has a small right pleural effusion but there is no left pleural effusions. Small pericardial effusion is identified. There is cardiomegaly.  The adrenal glands, spleen, pancreas and biliary tree are unremarkable. The gallbladder appears normal. There is a hypo attenuating lesion in the left hepatic lobe measuring 2.7 x 3.7 cm on image 19. No lymphadenopathy or fluid is identified. Colonic diverticulosis is worst in the sigmoid but there is no diverticulitis. The colon is otherwise unremarkable. The stomach and small bowel appear normal. Extensive aortoiliac atherosclerosis without aneurysm is seen. No lytic or sclerotic bony lesion is identified.  IMPRESSION: Nonspecific hypo attenuating lesion left hepatic lobe could be a benign entity such as a hemangioma but may represent primary or metastatic neoplasm. MRI of the liver without without contrast is recommended for further evaluation.  Small right pleural effusion and small pericardial effusion.  Nonobstructing stone lower pole left kidney.  Diverticulosis without diverticulitis.   Electronically Signed   By: Inge Rise M.D.   On: 10/19/2013 14:19    Microbiology: Recent Results (from the past 240 hour(s))  CULTURE, BLOOD (ROUTINE X 2)     Status: None   Collection Time    10/19/13  3:00 PM      Result Value Ref Range Status   Specimen Description BLOOD  RIGHT [facility]   Final   Special Requests     Final   Value: Normal BOTTLES DRAWN AEROBIC AND ANAEROBIC 5CC EACH   Culture  Setup Time     Final   Value: 10/19/2013 20:30     Performed at [facility]   Culture     Final   Value: NO GROWTH 5 DAYS     Performed at [facility]   Report Status 10/25/2013 FINAL   Final  CULTURE, BLOOD (ROUTINE X 2)     Status: None   Collection Time    10/19/13  3:15 PM      Result Value Ref Range Status   Specimen Description BLOOD RIGHT HAND   Final   Special Requests     Final   Value: Normal BOTTLES DRAWN AEROBIC AND ANAEROBIC  5CC EACH   Culture  Setup Time     Final   Value: 10/19/2013 20:29     Performed at [facility]   Culture     Final   Value: NO GROWTH 5 DAYS     Performed at [facility]   Report Status 10/25/2013 FINAL   Final  AFB CULTURE WITH SMEAR     Status: None   Collection Time    10/24/13 11:50 AM      Result Value Ref Range Status   Specimen Description PLEURAL   Final   Special Requests Normal   Final   Acid Fast Smear     Final   Value: NO ACID FAST BACILLI SEEN     Performed at [facility]   Culture     Final   Value: CULTURE WILL BE EXAMINED FOR 6 WEEKS BEFORE ISSUING A FINAL REPORT     Performed at [facility]   Report Status PENDING   Incomplete  BODY FLUID CULTURE     Status: None   Collection Time    10/24/13 11:50 AM      Result Value Ref Range Status   Specimen Description LUNG   Final   Special Requests Normal   Final   Gram Stain     Final   Value: NO WBC SEEN     NO SQUAMOUS EPITHELIAL CELLS SEEN     NO ORGANISMS SEEN     Performed at [facility]   Culture     Final   Value: NO GROWTH 2 DAYS     Performed at [facility]   Report Status PENDING   Incomplete  FUNGUS CULTURE W SMEAR     Status: None   Collection Time    10/24/13 11:50 AM      Result Value Ref Range Status   Specimen Description PLEURAL   Final   Special Requests Normal    Final   Fungal Smear     Final   Value: NO YEAST OR FUNGAL ELEMENTS SEEN     Performed at [facility]   Culture     Final   Value: CULTURE IN PROGRESS FOR FOUR WEEKS     Performed at [facility]   Report Status PENDING   Incomplete     Labs: Basic Metabolic Panel:  Recent Labs Lab 10/22/13 0441 10/23/13 0445 10/24/13 0420 10/25/13 0535 10/26/13 0430  NA 137 138 140 140 141  K 4.0 4.3 4.3 4.1 3.8  CL 103 105 106 105 105  CO2 22 21 22 24 24   GLUCOSE 110* 113* 106* 116* 97  BUN 30* 28* 26* 25* 25*  CREATININE 1.69* 1.64* 1.60* 1.57* 1.57*  CALCIUM 8.7 8.8 9.3 9.1 8.9   Liver Function Tests:  Recent Labs Lab 10/19/13 1345 10/20/13 0240 10/21/13 0415 10/24/13 0420 10/26/13 0430  AST 28 23 17   --  44*  ALT 33 29 24  --  69*  ALKPHOS 76 72 80  --  218*  BILITOT 1.6* 1.3* 1.1  --  1.1  PROT 7.0 6.2 6.1 6.6 6.2  ALBUMIN 3.4* 2.9* 2.7*  --  2.7*   No results found for this basename: LIPASE, AMYLASE,  in the last 168 hours No results found for this basename: AMMONIA,  in the last 168 hours CBC:  Recent Labs Lab 10/19/13 1345 10/20/13 0240  10/22/13 0441 10/23/13 0445 10/24/13 0420 10/25/13 0535 10/26/13 0430  WBC 21.6* 21.3*  < > 10.7* 11.1* 11.7* 11.2* 10.0  NEUTROABS 17.3* 14.0*  --   --   --   --   --   --   HGB 10.3* 9.3*  < > 9.4* 9.2* 9.2* 8.8* 8.6*  HCT 30.7* 28.1*  < > 28.0* 28.5* 27.4* 27.0* 25.8*  MCV 95.9 95.6  < > 94.6 96.3 93.8 96.8 94.5  PLT 124* 114*  < > 148* 161 180 159 171  < > = values in this interval not displayed. Cardiac Enzymes:  Recent Labs Lab 10/19/13 2050 10/20/13 0240 10/20/13 0810  TROPONINI <0.30 <0.30 <0.30   BNP: BNP (last 3 results) No results found for this basename: PROBNP,  in the last 8760 hours CBG: No results found for this basename: GLUCAP,  in the last 168 hours     Signed:  Tara Wich S  [facility] 10/26/2013, 10:54 AM

## 2013-10-26 NOTE — Progress Notes (Signed)
[facility] PCCM  S: Feels better today, still some mild shortness of breath, has been up walking around O: Filed Vitals:   10/25/13 1400 10/25/13 2045 10/26/13 0414 10/26/13 0904  BP: 123/59 123/55 127/70 115/53  Pulse: 83 80 80 80  Temp: 98.3 F (36.8 C) 99.8 F (37.7 C) 97.8 F (36.6 C) 98.1 F (36.7 C)  TempSrc: Oral Oral Oral Oral  Resp: 18 18 18 18   Height:      Weight:   94.1 kg (207 lb 7.3 oz) 94.8 kg (208 lb 15.9 oz)  SpO2: 98% 96% 96% 96%   RA  Gen: comfortable in bed HEENT: NCAT, EOMi PULM: diminished R base, otherwise clear CV: RRR  Ab: BS+, soft, nontender Neuro: A&Ox4, maew  Pleural fluid: LDH 591 Protein 3.6 WBC 821 (seg 55, L 31, Mono 12)  pH 8.0 Gram stain : negative  Repeat CT chest > no mass, effusion improved  Impression: 1) CAP 2) parapneumonic effusion> clearly exudative, but not complicated other than loculations.  Discussed with thoracic surgery, they feel that VATS would not be able to get much more fluid out than we have already.   3) Coagulopathy from warfarin   Plan: 1) No further pleural procedures needed as he is up and walking around and feels well 2) f/u with me in our [place] office>> Will have Chest X-ray; Visit is 10/29 at 11:00AM  PCCM to sign off, thanks for consult  Roselie Awkward, MD [place] [facility] Pager: [phone] Cell: [phone] If no response, call [phone]

## 2013-10-27 LAB — BODY FLUID CULTURE
CULTURE: NO GROWTH
Gram Stain: NONE SEEN
Special Requests: NORMAL

## 2013-11-06 ENCOUNTER — Other Ambulatory Visit: Payer: Self-pay | Admitting: Internal Medicine

## 2013-11-13 ENCOUNTER — Ambulatory Visit (INDEPENDENT_AMBULATORY_CARE_PROVIDER_SITE_OTHER): Payer: Medicare Other | Admitting: *Deleted

## 2013-11-13 ENCOUNTER — Encounter: Payer: Self-pay | Admitting: Internal Medicine

## 2013-11-13 ENCOUNTER — Telehealth: Payer: Self-pay | Admitting: Cardiology

## 2013-11-13 DIAGNOSIS — I4821 Permanent atrial fibrillation: Secondary | ICD-10-CM

## 2013-11-13 DIAGNOSIS — I482 Chronic atrial fibrillation: Secondary | ICD-10-CM

## 2013-11-13 LAB — MDC_IDC_ENUM_SESS_TYPE_REMOTE
Battery Voltage: 3.01 V
Brady Statistic AS VP Percent: 98.41 %
Brady Statistic RA Percent Paced: 0 %
Brady Statistic RV Percent Paced: 98.41 %
Date Time Interrogation Session: 20151026184940
Lead Channel Impedance Value: 4047 Ohm
Lead Channel Impedance Value: 4047 Ohm
Lead Channel Impedance Value: 4047 Ohm
Lead Channel Impedance Value: 551 Ohm
Lead Channel Impedance Value: 627 Ohm
Lead Channel Pacing Threshold Amplitude: 0.875 V
Lead Channel Sensing Intrinsic Amplitude: 11.875 mV
Lead Channel Sensing Intrinsic Amplitude: 11.875 mV
Lead Channel Setting Pacing Amplitude: 2.5 V
Lead Channel Setting Pacing Amplitude: 2.5 V
Lead Channel Setting Pacing Pulse Width: 0.4 ms
Lead Channel Setting Pacing Pulse Width: 0.8 ms
Lead Channel Setting Sensing Sensitivity: 5.6 mV
MDC IDC MSMT BATTERY REMAINING LONGEVITY: 74 mo
MDC IDC MSMT LEADCHNL LV IMPEDANCE VALUE: 4047 Ohm
MDC IDC MSMT LEADCHNL LV IMPEDANCE VALUE: 646 Ohm
MDC IDC MSMT LEADCHNL RA IMPEDANCE VALUE: 4047 Ohm
MDC IDC MSMT LEADCHNL RV IMPEDANCE VALUE: 570 Ohm
MDC IDC MSMT LEADCHNL RV PACING THRESHOLD PULSEWIDTH: 0.4 ms
MDC IDC STAT BRADY AP VP PERCENT: 0 %
MDC IDC STAT BRADY AP VS PERCENT: 0 %
MDC IDC STAT BRADY AS VS PERCENT: 1.59 %
Zone Setting Detection Interval: 350 ms
Zone Setting Detection Interval: 400 ms

## 2013-11-13 NOTE — Telephone Encounter (Signed)
Spoke with pt and reminded pt of remote transmission that is due today. Pt verbalized understanding.   

## 2013-11-13 NOTE — Telephone Encounter (Signed)
New message          Pt would like to know if you received his transmission

## 2013-11-14 ENCOUNTER — Encounter: Payer: Self-pay | Admitting: Cardiology

## 2013-11-14 ENCOUNTER — Ambulatory Visit (INDEPENDENT_AMBULATORY_CARE_PROVIDER_SITE_OTHER): Payer: Medicare Other | Admitting: Cardiology

## 2013-11-14 VITALS — BP 108/60 | HR 85 | Ht 74.0 in | Wt 205.0 lb

## 2013-11-14 DIAGNOSIS — I4891 Unspecified atrial fibrillation: Secondary | ICD-10-CM

## 2013-11-14 DIAGNOSIS — D4620 Refractory anemia with excess of blasts, unspecified: Secondary | ICD-10-CM

## 2013-11-14 DIAGNOSIS — D46Z Other myelodysplastic syndromes: Secondary | ICD-10-CM

## 2013-11-14 DIAGNOSIS — I5022 Chronic systolic (congestive) heart failure: Secondary | ICD-10-CM

## 2013-11-14 DIAGNOSIS — I442 Atrioventricular block, complete: Secondary | ICD-10-CM

## 2013-11-14 NOTE — Assessment & Plan Note (Signed)
The patient is on chronic warfarin followed by Dr.Ennever.  He has not had any recurrent TIA symptoms

## 2013-11-14 NOTE — Assessment & Plan Note (Signed)
The patient has a history of chronic anemia, followed by Dr.Ennever.

## 2013-11-14 NOTE — Patient Instructions (Signed)
Your physician recommends that you continue on your current medications as directed. Please refer to the Current Medication list given to you today.  Your physician wants you to follow-up in: 6 month ov You will receive a reminder letter in the mail two months in advance. If you don't receive a letter, please call our office to schedule the follow-up appointment.  

## 2013-11-14 NOTE — Progress Notes (Signed)
Remote pacemaker transmission.   

## 2013-11-14 NOTE — Assessment & Plan Note (Signed)
Patient is not having any symptoms of worsening CHF.  He had a recent pleural effusion associated with his pneumonia.

## 2013-11-14 NOTE — Progress Notes (Signed)
[name] Date of Birth:  11-23-1927 [facility] [address] [place], [place]  [address] [phone]        Fax   [phone]   History of Present Illness: This pleasant 80 year old gentleman is seen for a scheduled followup office visit. He has a history of chronic congestive heart failure. He was hospitalized for this at [facility] in 2009. He has a past history of established atrial fibrillation and had an ablation of his AV node with implantation of a biventricular pacemaker on 12/28/06 by Dr. Norville Haggard at [facility]. He was part of a research study however and the biventricular function was not turned on until recently when he switched his care to our office after the conclusion of the research study. The patient has been on chronic Coumadin for his atrial fibrillation. The patient has a history of depressed left ventricular function. His previous ejection fraction was 45-50%. A repeat echo was done on 03/23/12 to see if turning on the biventricular function has made any difference. Unfortunately the ejection fraction is 25%. Nonetheless the patient feels well and states he feels better than he has in the past several years. He is active around the house and enjoys gardening.  Since we last saw him he was hospitalized from 118/15 until 02/07/13 for problems with ataxia. He was felt to of had a possible TIA. He had an unsteady gait. In retrospect he thinks that his ataxia may have been secondary to his Flomax and on his own he has reduced the dose to every other day and is not having any symptoms or side effects now.  He was hospitalized again in early October for cough and weakness and was found to have a right lower lobe pneumonia.   Current Outpatient Prescriptions  Medication Sig Dispense Refill  . alfuzosin (UROXATRAL) 10 MG 24 hr tablet Take 1 tablet by mouth daily.      Marland Kitchen allopurinol (ZYLOPRIM) 300 MG tablet Take 300 mg by mouth daily.      .  carvedilol (COREG) 6.25 MG tablet Take 1 tablet (6.25 mg total) by mouth 2 (two) times daily with a meal.  60 tablet  3  . famotidine (PEPCID) 20 MG tablet Take 40 mg by mouth at bedtime.       . finasteride (PROSCAR) 5 MG tablet Take 5 mg by mouth every evening. 5pm      . furosemide (LASIX) 40 MG tablet TAKE ONE TABLET BY MOUTH ONCE DAILY  30 tablet  0  . Multiple Vitamin (MULTIVITAMIN WITH MINERALS) TABS tablet Take 1 tablet by mouth daily.      Marland Kitchen pyridoxine (B-6) 100 MG tablet Take 100 mg by mouth 2 (two) times daily.       Marland Kitchen warfarin (COUMADIN) 5 MG tablet Take 5 mg by mouth daily. As directed.       No current facility-administered medications for this visit.    Allergies  Allergen Reactions  . Moxifloxacin Hcl In Nacl Other (See Comments) and Itching    Pt c/o being "out of his head" & itching  . Methylprednisolone Other (See Comments)    Dizziness, passed out twice  . Ramipril Other (See Comments)    Renal function worsened on ACEi.    Patient Active Problem List   Diagnosis Date Noted  . CAP (community acquired pneumonia) 10/19/2013  . Sepsis 10/19/2013  . Permanent atrial fibrillation 03/13/2013  . Stage III chronic kidney disease 02/06/2013  .  Anemia 02/06/2013  . Ataxia 02/05/2013  . TIA (transient ischemic attack) 02/05/2013  . Complete heart block 09/12/2011  . MDS (myelodysplastic syndrome), low grade 02/10/2011  . Iron overload, transfusional 02/10/2011  . Shoulder impingement syndrome 12/23/2010  . Atrial fibrillation with controlled ventricular response 09/15/2010  . Chronic systolic congestive heart failure 09/15/2010  . Pacemaker 09/15/2010    History  Smoking status  . Former Smoker -- 1.00 packs/day for 25 years  . Types: Cigarettes  . Start date: 02/23/1944  . Quit date: 02/10/1968  Smokeless tobacco  . Never Used    Comment: quit 44 years ago    History  Alcohol Use No    Comment: 02/06/2013 "drank a little bit from age [age]"    Family  History  Problem Relation Age of Onset  . Cancer Mother   . Melanoma Mother   . COPD Father   . Kidney disease Brother     Review of Systems: Constitutional: no fever chills diaphoresis or fatigue or change in weight.  Head and neck: no hearing loss, no epistaxis, no photophobia or visual disturbance. Respiratory: No cough, shortness of breath or wheezing. Cardiovascular: No chest pain peripheral edema, palpitations. Gastrointestinal: No abdominal distention, no abdominal pain, no change in bowel habits hematochezia or melena. Genitourinary: No dysuria, no frequency, no urgency, no nocturia. Musculoskeletal:No arthralgias, no back pain, no gait disturbance or myalgias. Neurological: No dizziness, no headaches, no numbness, no seizures, no syncope, no weakness, no tremors. Hematologic: No lymphadenopathy, no easy bruising. Psychiatric: No confusion, no hallucinations, no sleep disturbance.    Physical Exam: Filed Vitals:   11/14/13 0924  BP: 108/60  Pulse: 85  The patient appears to be in no distress.  Head and neck exam reveals that the pupils are equal and reactive.  The extraocular movements are full.  There is no scleral icterus.  Mouth and pharynx are benign.  No lymphadenopathy.  No carotid bruits.  The jugular venous pressure is normal.  Thyroid is not enlarged or tender.  Chest is clear to percussion and auscultation.  No rales or rhonchi.  Expansion of the chest is symmetrical.  Pacemaker in left upper chest.  Heart reveals no abnormal lift or heave.  First and second heart sounds are normal.  There is no murmur gallop rub or click.  The abdomen is soft and nontender.  Bowel sounds are normoactive.  There is no hepatosplenomegaly or mass.  There are no abdominal bruits.  Extremities reveal no phlebitis or edema.  Pedal pulses are good.  There is no cyanosis or clubbing.  Compression stockings in place Neurologic exam is normal strength and no lateralizing weakness.  No  sensory deficits.  Integument reveals no rash  EKG shows atrial fibrillation with ventricular paced rhythm  Assessment / Plan: 1. chronic systolic left ventricular dysfunction 2. chronic permanent atrial fibrillation with previous ablation of AV node and with biventricular pacemaker implanted 12/28/06 at [facility] 3. myelodysplastic syndrome followed by Dr. Marin Olp. 4. past history of gout 5. chronic kidney disease  Disposition: Continue same medication.  Recheck in 6 months office visit

## 2013-11-15 NOTE — Telephone Encounter (Signed)
I seen pt in office on Tuesday 11-14-13 while he was here seeing Dr. Mare Ferrari. I informed pt that we did receive transmission at that time. He verbalized understanding.

## 2013-11-16 ENCOUNTER — Encounter: Payer: Self-pay | Admitting: Pulmonary Disease

## 2013-11-16 ENCOUNTER — Ambulatory Visit (INDEPENDENT_AMBULATORY_CARE_PROVIDER_SITE_OTHER)
Admission: RE | Admit: 2013-11-16 | Discharge: 2013-11-16 | Disposition: A | Discharge status: Home / Self Care | Payer: Medicare Other | Source: Ambulatory Visit | Attending: Pulmonary Disease | Admitting: Pulmonary Disease

## 2013-11-16 ENCOUNTER — Ambulatory Visit (INDEPENDENT_AMBULATORY_CARE_PROVIDER_SITE_OTHER): Payer: Medicare Other | Admitting: Pulmonary Disease

## 2013-11-16 VITALS — BP 118/64 | HR 83 | Temp 98.5°F | Ht 74.0 in | Wt 207.0 lb

## 2013-11-16 DIAGNOSIS — J189 Pneumonia, unspecified organism: Secondary | ICD-10-CM

## 2013-11-16 NOTE — Assessment & Plan Note (Signed)
[name] has had complete resolution of his community acquired pneumonia. His chest x-ray today shows no residual infiltrate or pleural effusion. At this point he is clear from a pulmonary standpoint and does not need to follow-up with [place] further.

## 2013-11-16 NOTE — Progress Notes (Signed)
Subjective:    Patient ID: [name], male    DOB: 11/17/1927, 80 y.o.   MRN: [id number]  Synopsis: [name] had severe technique were pneumonia with a loculated a parapneumonic effusion in October 2015 and pulmonary critical care medicine was consulted for thoracentesis. He had near complete drainage of his fluid while hospitalized.  HPI  11/16/2013 routine office visit: [name] returns to clinic today stating that he has been feeling fine. He has not had cough, chest pain, weight loss, or shortness of breath since his last visit. He has remained active. He has not had significant symptoms from a respiratory standpoint. He says that he has not had fevers or chills. He has not had leg swelling. Overall he is feeling much better and much stronger than when he was hospitalized earlier this month.  Past Medical History  Diagnosis Date  . Bursitis     OF THE RIGHT SHOULDER  . Tinnitus   . Permanent atrial fibrillation     s/p AV nodal ablation  . Depressed ejection fraction 10    EF previously 45-50%, s/p AV nodal ablation and biv pacemaker implant, previously enrolled in Block HF and was randomized to RV pacing only)  . Chronic anemia     RECIEVES ARANESP SHOTS IF HIS HEMOGLOBIN FALLS BELOW 11  . Blood transfusion     "several; Dr. Deberah Pelton" (02/06/2013)  . GERD (gastroesophageal reflux disease)   . MDS (myelodysplastic syndrome), low grade 02/10/2011  . Iron overload, transfusional 02/10/2011  . Complete heart block 12/28/06    s/p AV nodal ablation and BIV pacemaker implant at [facility] by Dr Rosita Fire  . CHF (congestive heart failure)   . Chronic bronchitis   . Pneumonia     "high school; [employer]; twice since, last time 12/ 2014" (02/06/2013)  . H/O hiatal hernia   . Arthritis     "hands, back" (02/06/2013)  . Gout   . Kidney stones 1985     Review of Systems     Objective:   Physical Exam Filed Vitals:   11/16/13 1103  BP: 118/64  Pulse: 83  Temp: 98.5  F (36.9 C)  TempSrc: Oral  Height: 6\' 2"  (1.88 m)  Weight: 207 lb (93.895 kg)  SpO2: 99%   Gen: well appearing, no acute distress HEENT: NCAT,  EOMi, OP clear,  PULM: CTA B CV: Irreg irreg, no mgr, no JVD AB: BS+, soft, nontender, no hsm Ext: warm, no edema, no clubbing, no cyanosis Derm: no rash or skin breakdown Neuro: A&Ox4, MAEW  11/16/2013 chest x-ray images reviewed by me> there is complete resolution of the right-sided pleural effusion, there is emphysema, there is no right-sided infiltrate, there is a dual-lead pacemaker in place     Assessment & Plan:   CAP (community acquired pneumonia) [name] has had complete resolution of his community acquired pneumonia. His chest x-ray today shows no residual infiltrate or pleural effusion. At this point he is clear from a pulmonary standpoint and does not need to follow-up with [place] further.    Updated Medication List Outpatient Encounter Prescriptions as of 11/16/2013  Medication Sig  . alfuzosin (UROXATRAL) 10 MG 24 hr tablet Take 1 tablet by mouth daily.  Marland Kitchen allopurinol (ZYLOPRIM) 300 MG tablet Take 300 mg by mouth daily.  . carvedilol (COREG) 6.25 MG tablet Take 1 tablet (6.25 mg total) by mouth 2 (two) times daily with a meal.  . famotidine (PEPCID) 20 MG tablet Take 40 mg  by mouth at bedtime.   . finasteride (PROSCAR) 5 MG tablet Take 5 mg by mouth every evening. 5pm  . furosemide (LASIX) 40 MG tablet TAKE ONE TABLET BY MOUTH ONCE DAILY  . Multiple Vitamin (MULTIVITAMIN WITH MINERALS) TABS tablet Take 1 tablet by mouth daily.  Marland Kitchen pyridoxine (B-6) 100 MG tablet Take 100 mg by mouth 2 (two) times daily.   Marland Kitchen warfarin (COUMADIN) 5 MG tablet Take 5 mg by mouth daily. As directed.

## 2013-11-16 NOTE — Patient Instructions (Signed)
Return to see [place] if you develop shortness of breath or any lung problems

## 2013-11-21 ENCOUNTER — Ambulatory Visit (HOSPITAL_BASED_OUTPATIENT_CLINIC_OR_DEPARTMENT_OTHER): Payer: Medicare Other | Admitting: Lab

## 2013-11-21 ENCOUNTER — Encounter: Payer: Self-pay | Admitting: Hematology & Oncology

## 2013-11-21 ENCOUNTER — Ambulatory Visit (HOSPITAL_BASED_OUTPATIENT_CLINIC_OR_DEPARTMENT_OTHER): Payer: Medicare Other | Admitting: Hematology & Oncology

## 2013-11-21 VITALS — BP 95/60 | HR 85 | Temp 97.6°F | Resp 18 | Ht 72.0 in | Wt 206.0 lb

## 2013-11-21 DIAGNOSIS — I4821 Permanent atrial fibrillation: Secondary | ICD-10-CM

## 2013-11-21 DIAGNOSIS — I4891 Unspecified atrial fibrillation: Secondary | ICD-10-CM

## 2013-11-21 DIAGNOSIS — D464 Refractory anemia, unspecified: Secondary | ICD-10-CM

## 2013-11-21 DIAGNOSIS — D4620 Refractory anemia with excess of blasts, unspecified: Secondary | ICD-10-CM

## 2013-11-21 LAB — MANUAL DIFFERENTIAL (CHCC SATELLITE)
ALC: 1.9 10*3/uL (ref 0.9–3.3)
ANC (CHCC HP manual diff): 1.9 10*3/uL (ref 1.5–6.5)
BAND NEUTROPHILS: 1 % (ref 0–10)
EOS: 1 % (ref 0–7)
LYMPH: 39 % (ref 14–48)
MONO: 21 % — AB (ref 0–13)
PLT EST ~~LOC~~: DECREASED
Platelet Morphology: NORMAL
SEG: 38 % — ABNORMAL LOW (ref 40–75)

## 2013-11-21 LAB — IRON AND TIBC CHCC
%SAT: 74 % — ABNORMAL HIGH (ref 20–55)
IRON: 127 ug/dL (ref 42–163)
TIBC: 171 ug/dL — AB (ref 202–409)
UIBC: 45 ug/dL — AB (ref 117–376)

## 2013-11-21 LAB — FERRITIN CHCC

## 2013-11-21 LAB — CBC WITH DIFFERENTIAL (CANCER CENTER ONLY)
HCT: 32.6 % — ABNORMAL LOW (ref 38.7–49.9)
HEMOGLOBIN: 10.7 g/dL — AB (ref 13.0–17.1)
MCH: 32.4 pg (ref 28.0–33.4)
MCHC: 32.8 g/dL (ref 32.0–35.9)
MCV: 99 fL — ABNORMAL HIGH (ref 82–98)
Platelets: 96 10*3/uL — ABNORMAL LOW (ref 145–400)
RBC: 3.3 10*6/uL — ABNORMAL LOW (ref 4.20–5.70)
RDW: 14.7 % (ref 11.1–15.7)
WBC: 4.8 10*3/uL (ref 4.0–10.0)

## 2013-11-21 LAB — PROTIME-INR (CHCC SATELLITE)
INR: 3.2 (ref 2.0–3.5)
Protime: 38.4 Seconds — ABNORMAL HIGH (ref 10.6–13.4)

## 2013-11-21 LAB — FUNGUS CULTURE W SMEAR
Fungal Smear: NONE SEEN
SPECIAL REQUESTS: NORMAL

## 2013-11-21 LAB — RETICULOCYTES (CHCC)
ABS Retic: 20.2 10*3/uL (ref 19.0–186.0)
RBC.: 3.36 MIL/uL — ABNORMAL LOW (ref 4.22–5.81)
Retic Ct Pct: 0.6 % (ref 0.4–2.3)

## 2013-11-21 LAB — CHCC SATELLITE - SMEAR

## 2013-11-22 ENCOUNTER — Encounter: Payer: Self-pay | Admitting: Cardiology

## 2013-11-22 NOTE — Progress Notes (Signed)
Hematology and Oncology Follow Up Visit  [name] [id number] Jun 16, 1927 80 y.o. 11/22/2013   Principle Diagnosis:   Refracted anemia-low risk by IPSS  Chronic atrial fibrillation  Current Therapy:    Coumadin-lifelong   Aranesp 300 mcg subcutaneous for hemoglobin less than 10     Interim History:  Mr.  Marks is back for followup. Unfortunately, he was in the hospital about a month ago. He probably had pneumonia. I looked up his hospital stay. No cultures were positive. He had fluid taken out of his right lung. He had over 1300 mL of fluid sent.the pathology report was negative for malignancy.  He now is starting to feel better. He feels stronger.  He, chronic, has had a problem with fever. He's had no abdominal pain. His breathing is doing better. There has been no diarrhea.   His blood iron studies done back in Septembershowed a ferritin of 1529. His iron saturation was 69%.  Overall, his performance status is ECOG 1 Medications: Current outpatient prescriptions: alfuzosin (UROXATRAL) 10 MG 24 hr tablet, Take 1 tablet by mouth daily., Disp: , Rfl: ;  allopurinol (ZYLOPRIM) 300 MG tablet, Take 300 mg by mouth daily., Disp: , Rfl: ;  carvedilol (COREG) 6.25 MG tablet, Take 1 tablet (6.25 mg total) by mouth 2 (two) times daily with a meal., Disp: 60 tablet, Rfl: 3;  famotidine (PEPCID) 20 MG tablet, Take 40 mg by mouth at bedtime. , Disp: , Rfl:  finasteride (PROSCAR) 5 MG tablet, Take 5 mg by mouth every evening. 5pm, Disp: , Rfl: ;  furosemide (LASIX) 40 MG tablet, TAKE ONE TABLET BY MOUTH ONCE DAILY, Disp: 30 tablet, Rfl: 0;  Multiple Vitamin (MULTIVITAMIN WITH MINERALS) TABS tablet, Take 1 tablet by mouth daily., Disp: , Rfl: ;  pyridoxine (B-6) 100 MG tablet, Take 100 mg by mouth 2 (two) times daily. , Disp: , Rfl:  warfarin (COUMADIN) 5 MG tablet, Take 5 mg by mouth daily. As directed., Disp: , Rfl:   Allergies:  Allergies  Allergen Reactions  . Moxifloxacin Hcl In  Nacl Other (See Comments) and Itching    Pt c/o being "out of his head" & itching  . Methylprednisolone Other (See Comments)    Dizziness, passed out twice  . Ramipril Other (See Comments)    Renal function worsened on ACEi.    Past Medical History, Surgical history, Social history, and Family History were reviewed and updated.  Review of Systems: As above  Physical Exam:  height is 6' (1.829 m) and weight is 206 lb (93.441 kg). His oral temperature is 97.6 F (36.4 C). His blood pressure is 95/60 and his pulse is 85. His respiration is 18.   Well-developed and well-nourished white woman. Head and neck exam shows no ocular or oral lesion.  He has no palpable cervical or supraclavicular lymph nodes. Lungs are clear. He does arouse, wheezes or rhonchi. Cardiac exam is regular rate and rhythm. He has an occasional extra beat. His he has a 1/6 systolic murmur. Abdomen soft. Has good bowel sounds. There is no fluid wave. There is no palpable liver or spleen tip. Extremities shows no clubbing, cyanosis or edema. His compression stockings on his legs. Skin exam shows no rashes, ecchymosis or petechia. Neurological exam shows no focal neurological deficits.  Lab Results  Component Value Date   WBC 4.8 11/21/2013   HGB 10.7* 11/21/2013   HCT 32.6* 11/21/2013   MCV 99* 11/21/2013   PLT 96* 11/21/2013  Chemistry      Component Value Date/Time   NA 141 10/26/2013 0430   K 3.8 10/26/2013 0430   CL 105 10/26/2013 0430   CO2 24 10/26/2013 0430   BUN 25* 10/26/2013 0430   CREATININE 1.57* 10/26/2013 0430      Component Value Date/Time   CALCIUM 8.9 10/26/2013 0430   ALKPHOS 218* 10/26/2013 0430   AST 44* 10/26/2013 0430   ALT 69* 10/26/2013 0430   BILITOT 1.1 10/26/2013 0430      Ferritin is 1765. Iron saturation is 74%   Impression and Plan: [name] is a 80 year old gentleman. He has refractory anemia. We do not have to give him any Aranesp. He's done incredibly well with  this.  On his blood smear, I do not see any evidence of transition over to leukemia.  His INR is 3.2. I will not adjust his Coumadin dose at all.  I feel bad that he had this pneumonia.  thankfully, he is improving from this.  His platelet count is going down. We're going to have to watch this closely. He may need to have another bone marrow test done. We probably have not done one on him in several years.   We will plan to him back here in another 6 weeks now.  I spent about 30 minutes with him today.   Henry Napoleon, MD 11/4/20157:48 AM

## 2013-11-30 ENCOUNTER — Ambulatory Visit (INDEPENDENT_AMBULATORY_CARE_PROVIDER_SITE_OTHER): Payer: Medicare Other | Admitting: Podiatry

## 2013-11-30 ENCOUNTER — Encounter: Payer: Self-pay | Admitting: Podiatry

## 2013-11-30 VITALS — BP 164/82 | HR 89 | Resp 16

## 2013-11-30 DIAGNOSIS — M79674 Pain in right toe(s): Secondary | ICD-10-CM

## 2013-11-30 DIAGNOSIS — M898X0 Other specified disorders of bone, multiple sites: Secondary | ICD-10-CM

## 2013-11-30 DIAGNOSIS — M2041 Other hammer toe(s) (acquired), right foot: Secondary | ICD-10-CM

## 2013-11-30 DIAGNOSIS — Q786 Multiple congenital exostoses: Secondary | ICD-10-CM

## 2013-11-30 NOTE — Patient Instructions (Signed)
Pre-Operative Instructions  Congratulations, you have decided to take an important step to improving your quality of life.  You can be assured that the doctors of [facility] will be with you every step of the way.  1. Plan to be at the surgery center/hospital at least 1 (one) hour prior to your scheduled time unless otherwise directed by the surgical center/hospital staff.  You must have a responsible adult accompany you, remain during the surgery and drive you home.  Make sure you have directions to the surgical center/hospital and know how to get there on time. 2. For hospital based surgery you will need to obtain a history and physical form from your family physician within 1 month prior to the date of surgery- we will give you a form for you primary physician.  3. We make every effort to accommodate the date you request for surgery.  There are however, times where surgery dates or times have to be moved.  We will contact you as soon as possible if a change in schedule is required.   4. No Aspirin/Ibuprofen for one week before surgery.  If you are on aspirin, any non-steroidal anti-inflammatory medications (Mobic, Aleve, Ibuprofen) you should stop taking it 7 days prior to your surgery.  You make take Tylenol  For pain prior to surgery.  5. Medications- If you are taking daily heart and blood pressure medications, seizure, reflux, allergy, asthma, anxiety, pain or diabetes medications, make sure the surgery center/hospital is aware before the day of surgery so they may notify you which medications to take or avoid the day of surgery. 6. No food or drink after midnight the night before surgery unless directed otherwise by surgical center/hospital staff. 7. No alcoholic beverages 24 hours prior to surgery.  No smoking 24 hours prior to or 24 hours after surgery. 8. Wear loose pants or shorts- loose enough to fit over bandages, boots, and casts. 9. No slip on shoes, sneakers are best. 10. Bring  your boot with you to the surgery center/hospital.  Also bring crutches or a walker if your physician has prescribed it for you.  If you do not have this equipment, it will be provided for you after surgery. 11. If you have not been contracted by the surgery center/hospital by the day before your surgery, call to confirm the date and time of your surgery. 12. Leave-time from work may vary depending on the type of surgery you have.  Appropriate arrangements should be made prior to surgery with your employer. 13. Prescriptions will be provided immediately following surgery by your doctor.  Have these filled as soon as possible after surgery and take the medication as directed. 14. Remove nail polish on the operative foot. 15. Wash the night before surgery.  The night before surgery wash the foot and leg well with the antibacterial soap provided and water paying special attention to beneath the toenails and in between the toes.  Rinse thoroughly with water and dry well with a towel.  Perform this wash unless told not to do so by your physician.  Enclosed: 1 Ice pack (please put in freezer the night before surgery)   1 Hibiclens skin cleaner   Pre-op Instructions  If you have any questions regarding the instructions, do not hesitate to call our office.  [employer]: [address] [place], [place] [address] [phone]  [place]: [address] [place], [place] [address] [phone]  [place]: [address]  [place], [place] [address] [phone]  Dr. Richard   Tuchman DPM, Dr. Norman Regal DPM Dr. Richard Sikora DPM, Dr. M. Todd Hyatt DPM, Dr. Kathryn Egerton DPM 

## 2013-11-30 NOTE — Progress Notes (Signed)
Subjective:     Patient ID: [name], male   DOB: 19-Dec-1927, 80 y.o.   MRN: [id number]  HPIpatient states I'm still having a lot of pain in my right second toe and also where the big toe is pressing there is a bump and a spur after it was fused last year and is continuing to give me trouble the medicine that you put in only gave me relief for several weeks. I would like to get it fixed   Review of Systems     Objective:   Physical Exam Neurovascular status intact with muscle strength adequate range of motion of the subtalar and midtarsal joint within normal limits. Patient is noted to have a prominence on the right second toe with keratotic lesion redness and inflammation noted at the head of the proximal phalanx and a prominent spur on the right hallux that is pressing against this area with pain    Assessment:     Hammertoe deformity and exostosis of the hallux    Plan:     Due to lack of response to conservative care I have recommended arthroplasty and removal of the small spur from the right hallux explaining the surgery and allowing him to read consent form. I explained to him all risk of surgery and everything as outlined in the consent form and patient is willing to accept risk and wants the surgery to be performed he signs consent form after review and is scheduled for outpatient surgery understanding total recovery. Can take upwards of 6 months

## 2013-12-01 DIAGNOSIS — IMO0001 Reserved for inherently not codable concepts without codable children: Secondary | ICD-10-CM | POA: Insufficient documentation

## 2013-12-01 DIAGNOSIS — J90 Pleural effusion, not elsewhere classified: Secondary | ICD-10-CM | POA: Insufficient documentation

## 2013-12-06 LAB — ACID-FAST (MYCOBACTERIA) SMEAR AND CULTURE WITH REFLEX TO IDENTIFICATION
Acid Fast Smear: NONE SEEN
Special Requests: NORMAL

## 2013-12-07 ENCOUNTER — Telehealth: Payer: Self-pay | Admitting: Hematology & Oncology

## 2013-12-07 NOTE — Telephone Encounter (Signed)
Faxed medical records today to:  [facility]  P: [phone] F: [phone]       COPY SCANNED

## 2013-12-12 ENCOUNTER — Other Ambulatory Visit: Payer: Self-pay | Admitting: Cardiology

## 2013-12-19 ENCOUNTER — Encounter: Payer: Self-pay | Admitting: Podiatry

## 2013-12-19 DIAGNOSIS — M2570 Osteophyte, unspecified joint: Secondary | ICD-10-CM

## 2013-12-19 DIAGNOSIS — M2041 Other hammer toe(s) (acquired), right foot: Secondary | ICD-10-CM

## 2013-12-20 ENCOUNTER — Telehealth: Payer: Self-pay

## 2013-12-20 NOTE — Telephone Encounter (Signed)
Spoke with patient regading post operative status. He stated that he was doing well and managing pain effectivley. Advised to rest and elevate foot, remain in boot and keep dressing dry. Advised to watch for s/s of infection such as fever, chills aches.

## 2013-12-25 ENCOUNTER — Ambulatory Visit (INDEPENDENT_AMBULATORY_CARE_PROVIDER_SITE_OTHER): Payer: Medicare Other

## 2013-12-25 ENCOUNTER — Ambulatory Visit (INDEPENDENT_AMBULATORY_CARE_PROVIDER_SITE_OTHER): Payer: Medicare Other | Admitting: Podiatry

## 2013-12-25 ENCOUNTER — Encounter: Payer: Self-pay | Admitting: Podiatry

## 2013-12-25 VITALS — BP 133/63 | HR 89 | Resp 16

## 2013-12-25 DIAGNOSIS — M2041 Other hammer toe(s) (acquired), right foot: Secondary | ICD-10-CM

## 2013-12-25 DIAGNOSIS — M898X0 Other specified disorders of bone, multiple sites: Secondary | ICD-10-CM

## 2013-12-25 DIAGNOSIS — Q786 Multiple congenital exostoses: Secondary | ICD-10-CM

## 2013-12-26 NOTE — Progress Notes (Signed)
Subjective:     Patient ID: [name], male   DOB: [birth year], 80 y.o.   MRN: [id number]  HPI patient states my toes seem to be doing very well and I'm having minimal discomfort or swelling   Review of Systems     Objective:   Physical Exam Neurovascular status intact with dressing intact around the right hallux and second toe secondary to surgery    Assessment:     Upon removal of dressing wound edges are coapted well and stitches are in place    Plan:     At this time reviewed x-ray and discussed continued treatment for these areas and reapplied sterile dressing and gave instructions for suture removal in 2 weeks and to return earlier if any issues should occur

## 2013-12-28 ENCOUNTER — Encounter (HOSPITAL_COMMUNITY): Payer: Self-pay | Admitting: Internal Medicine

## 2013-12-28 NOTE — Progress Notes (Signed)
Dr Paulla Dolly performed a right 1st met chondylectomy and right 2nd met hammertoe repair on 12/19/13

## 2014-01-08 ENCOUNTER — Ambulatory Visit (HOSPITAL_BASED_OUTPATIENT_CLINIC_OR_DEPARTMENT_OTHER): Payer: Medicare Other | Admitting: Hematology & Oncology

## 2014-01-08 ENCOUNTER — Ambulatory Visit (HOSPITAL_BASED_OUTPATIENT_CLINIC_OR_DEPARTMENT_OTHER): Payer: Medicare Other | Admitting: Lab

## 2014-01-08 ENCOUNTER — Encounter: Payer: Self-pay | Admitting: Hematology & Oncology

## 2014-01-08 VITALS — BP 106/63 | HR 86 | Temp 97.8°F | Resp 18 | Ht 72.0 in | Wt 210.0 lb

## 2014-01-08 DIAGNOSIS — D4620 Refractory anemia with excess of blasts, unspecified: Secondary | ICD-10-CM

## 2014-01-08 DIAGNOSIS — I4891 Unspecified atrial fibrillation: Secondary | ICD-10-CM

## 2014-01-08 DIAGNOSIS — D464 Refractory anemia, unspecified: Secondary | ICD-10-CM

## 2014-01-08 LAB — CBC WITH DIFFERENTIAL (CANCER CENTER ONLY)
HCT: 35.2 % — ABNORMAL LOW (ref 38.7–49.9)
HGB: 11.4 g/dL — ABNORMAL LOW (ref 13.0–17.1)
MCH: 31.8 pg (ref 28.0–33.4)
MCHC: 32.4 g/dL (ref 32.0–35.9)
MCV: 98 fL (ref 82–98)
PLATELETS: 130 10*3/uL — AB (ref 145–400)
RBC: 3.58 10*6/uL — ABNORMAL LOW (ref 4.20–5.70)
RDW: 14 % (ref 11.1–15.7)
WBC: 6.7 10*3/uL (ref 4.0–10.0)

## 2014-01-08 LAB — CMP (CANCER CENTER ONLY)
ALBUMIN: 3.7 g/dL (ref 3.3–5.5)
ALT(SGPT): 25 U/L (ref 10–47)
AST: 27 U/L (ref 11–38)
Alkaline Phosphatase: 73 U/L (ref 26–84)
BUN, Bld: 19 mg/dL (ref 7–22)
CALCIUM: 9.4 mg/dL (ref 8.0–10.3)
CHLORIDE: 103 meq/L (ref 98–108)
CO2: 30 meq/L (ref 18–33)
Creat: 1.5 mg/dl — ABNORMAL HIGH (ref 0.6–1.2)
Glucose, Bld: 94 mg/dL (ref 73–118)
POTASSIUM: 3.7 meq/L (ref 3.3–4.7)
SODIUM: 141 meq/L (ref 128–145)
Total Bilirubin: 1.4 mg/dl (ref 0.20–1.60)
Total Protein: 7.4 g/dL (ref 6.4–8.1)

## 2014-01-08 LAB — MANUAL DIFFERENTIAL (CHCC SATELLITE)
ALC: 2.3 10*3/uL (ref 0.9–3.3)
ANC (CHCC MAN DIFF): 2.6 10*3/uL (ref 1.5–6.5)
Eos: 1 % (ref 0–7)
LYMPH: 34 % (ref 14–48)
MONO: 26 % — ABNORMAL HIGH (ref 0–13)
PLATELET MORPHOLOGY: NORMAL
PLT EST ~~LOC~~: DECREASED
SEG: 39 % — ABNORMAL LOW (ref 40–75)

## 2014-01-08 LAB — RETICULOCYTES (CHCC)
ABS Retic: 22 10*3/uL (ref 19.0–186.0)
RBC.: 3.66 MIL/uL — ABNORMAL LOW (ref 4.22–5.81)
RETIC CT PCT: 0.6 % (ref 0.4–2.3)

## 2014-01-08 LAB — IRON AND TIBC CHCC
%SAT: 71 % — ABNORMAL HIGH (ref 20–55)
IRON: 133 ug/dL (ref 42–163)
TIBC: 188 ug/dL — AB (ref 202–409)
UIBC: 54 ug/dL — ABNORMAL LOW (ref 117–376)

## 2014-01-08 LAB — PROTHROMBIN TIME
INR: 1.23 (ref <1.50)
Prothrombin Time: 15.5 seconds — ABNORMAL HIGH (ref 11.6–15.2)

## 2014-01-08 LAB — FERRITIN CHCC: Ferritin: 1620 ng/ml — ABNORMAL HIGH (ref 22–316)

## 2014-01-08 LAB — CHCC SATELLITE - SMEAR

## 2014-01-08 NOTE — Progress Notes (Signed)
Hematology and Oncology Follow Up Visit  [name] [id number] 05/01/1927 80 y.o. 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 is back for followup. He is feeling better. He had the pneumonia couple months ago.  He's had no bleeding. He's had no abdominal pain. He's had no cough. He's had some arthritic issues. There does been no rashes. He's had no leg swelling. He's had no headache.  His blood iron studies done back in November showed a ferritin of 1765 with an iron saturation 74%. We will have to watch this closely. At some point, we may have to think about iron chelation therapy.  He had a good Thanksgiving. He ate quite a bit. He's had no nausea or vomiting.  Overall, his performance status is ECOG 1 Medications: Current outpatient prescriptions: alfuzosin (UROXATRAL) 10 MG 24 hr tablet, Take 1 tablet by mouth daily., Disp: , Rfl: ;  allopurinol (ZYLOPRIM) 300 MG tablet, Take 300 mg by mouth daily., Disp: , Rfl: ;  carvedilol (COREG) 6.25 MG tablet, Take 1 tablet (6.25 mg total) by mouth 2 (two) times daily with a meal., Disp: 60 tablet, Rfl: 3;  famotidine (PEPCID) 20 MG tablet, Take 40 mg by mouth at bedtime. , Disp: , Rfl:  finasteride (PROSCAR) 5 MG tablet, Take 5 mg by mouth every evening. 5pm, Disp: , Rfl: ;  furosemide (LASIX) 40 MG tablet, TAKE ONE TABLET BY MOUTH ONCE DAILY, Disp: 30 tablet, Rfl: 3;  Multiple Vitamin (MULTIVITAMIN WITH MINERALS) TABS tablet, Take 1 tablet by mouth daily., Disp: , Rfl: ;  pyridoxine (B-6) 100 MG tablet, Take 100 mg by mouth 2 (two) times daily. , Disp: , Rfl:  warfarin (COUMADIN) 5 MG tablet, Take 5 mg by mouth daily. As directed., Disp: , Rfl:   Allergies:  Allergies  Allergen Reactions  . Moxifloxacin Hcl In Nacl Other (See Comments) and Itching     Pt c/o being "out of his head" & itching  . Methylprednisolone Other (See Comments)    Dizziness, passed out twice  . Ramipril Other (See Comments)    Renal function worsened on ACEi.    Past Medical History, Surgical history, Social history, and Family History were reviewed and updated.  Review of Systems: As above  Physical Exam:  height is 6' (1.829 m) and weight is 210 lb (95.255 kg). His oral temperature is 97.8 F (36.6 C). His blood pressure is 106/63 and his pulse is 86. His respiration is 18.   Well-developed and well-nourished white woman. Head and neck exam shows no ocular or oral lesion.  He has no palpable cervical or supraclavicular lymph nodes. Lungs are clear. He does arouse, wheezes or rhonchi. Cardiac exam is regular rate and rhythm. He has an occasional extra beat. His he has a 1/6 systolic murmur. Abdomen soft. Has good bowel sounds. There is no fluid wave. There is no palpable liver or spleen tip. Extremities shows no clubbing, cyanosis or edema. His compression stockings on his legs. Skin exam shows no rashes, ecchymosis or petechia. Neurological exam shows no focal neurological deficits.  Lab Results  Component Value Date   WBC 6.7 01/08/2014   HGB 11.4* 01/08/2014   HCT 35.2* 01/08/2014   MCV 98 01/08/2014   PLT 130* 01/08/2014     Chemistry  Component Value Date/Time   NA 141 01/08/2014 0956   NA 141 10/26/2013 0430   K 3.7 01/08/2014 0956   K 3.8 10/26/2013 0430   CL 103 01/08/2014 0956   CL 105 10/26/2013 0430   CO2 30 01/08/2014 0956   CO2 24 10/26/2013 0430   BUN 19 01/08/2014 0956   BUN 25* 10/26/2013 0430   CREATININE 1.5* 01/08/2014 0956   CREATININE 1.57* 10/26/2013 0430      Component Value Date/Time   CALCIUM 9.4 01/08/2014 0956   CALCIUM 8.9 10/26/2013 0430   ALKPHOS 73 01/08/2014 0956   ALKPHOS 218* 10/26/2013 0430   AST 27 01/08/2014 0956   AST 44* 10/26/2013 0430   ALT 25 01/08/2014 0956   ALT 69* 10/26/2013 0430    BILITOT 1.40 01/08/2014 0956   BILITOT 1.1 10/26/2013 0430         Impression and Plan: Mr. [name] is a 80 year old gentleman. He has refractory anemia. We do not have to give him any Aranesp. He's done incredibly well with this.  On his blood smear, I do not see any evidence of transition over to leukemia.   His platelet count is better. It got down with the last visit because of the recent pneumonia that he had.   We will plan to him back here in another 8 weeks now.  I spent about 30 minutes with him today.   [name], MD 12/21/201511:45 AM

## 2014-01-09 ENCOUNTER — Other Ambulatory Visit: Payer: Self-pay | Admitting: Hematology & Oncology

## 2014-01-09 ENCOUNTER — Telehealth: Payer: Self-pay | Admitting: *Deleted

## 2014-01-09 ENCOUNTER — Other Ambulatory Visit: Payer: Self-pay | Admitting: *Deleted

## 2014-01-09 DIAGNOSIS — D4620 Refractory anemia with excess of blasts, unspecified: Secondary | ICD-10-CM

## 2014-01-09 NOTE — Telephone Encounter (Addendum)
Spoke with patient. He understands the changes to his medication. We also set up appointment for him next week for lab draw.   ----- Message from [name], MD sent at 01/08/2014  6:53 PM EST ----- Call him and tell him that the INR is very low. Have him double the Coumadin for 3 days and then go back to 5 mg a day. He needs to have his INR rechecked in 7 days. Please set this up. Thanks.

## 2014-01-10 ENCOUNTER — Ambulatory Visit: Payer: Medicare Other | Admitting: Podiatry

## 2014-01-10 VITALS — BP 190/95 | HR 86 | Resp 14

## 2014-01-10 DIAGNOSIS — M2041 Other hammer toe(s) (acquired), right foot: Secondary | ICD-10-CM

## 2014-01-10 DIAGNOSIS — Z9889 Other specified postprocedural states: Secondary | ICD-10-CM

## 2014-01-10 DIAGNOSIS — Q786 Multiple congenital exostoses: Secondary | ICD-10-CM

## 2014-01-10 DIAGNOSIS — M898X0 Other specified disorders of bone, multiple sites: Secondary | ICD-10-CM

## 2014-01-10 NOTE — Progress Notes (Signed)
Subjective:     Patient ID: [name], male   DOB: 1927/10/11, 80 y.o.   MRN: [id number]  HPI patient states I'm doing fine and the incisions are healing well and I noticed less discomfort   Review of Systems     Objective:   Physical Exam Neurovascular status intact with well-healing surgical site right hallux second toe with wound edges well coapted and stitches in place    Assessment:     Doing well post exostectomy hammertoe repair hallux second toe right    Plan:     Stitches removed wound edges coapted well and band aids applied. Instructed on being careful with these for the next few weeks and if any drainage or other issues should occur to let us know immediately if not it appears he is healing uneventfully and should have a good long-term result.

## 2014-01-10 NOTE — Progress Notes (Signed)
   Subjective:    Patient ID: [name], male    DOB: Aug 18, 1927, 80 y.o.   MRN: [id number]  HPI Comments: DOS 12/19/2013 right 1st condylectomy, and 2nd hammer toe repair.     Review of Systems     Objective:   Physical Exam        Assessment & Plan:

## 2014-01-15 ENCOUNTER — Telehealth: Payer: Self-pay | Admitting: Cardiology

## 2014-01-15 DIAGNOSIS — R9430 Abnormal result of cardiovascular function study, unspecified: Secondary | ICD-10-CM

## 2014-01-15 DIAGNOSIS — R42 Dizziness and giddiness: Secondary | ICD-10-CM

## 2014-01-15 NOTE — Telephone Encounter (Signed)
Spoke with patient and he stated he saw his PCP for lightheadedness/dizziness Per patient PCP could not find anything wrong and recommended him maybe getting Echo or seeing  Dr. Mare Ferrari for follow up Patient stated dizziness happens at various times, maybe just walking or can be when he goes from sitting to standing position Will forward to  Dr. Mare Ferrari for review

## 2014-01-15 NOTE — Telephone Encounter (Signed)
Get echocardiogram and then see for office visit

## 2014-01-15 NOTE — Telephone Encounter (Signed)
New Msg       Pt having dizziness and pcp suggested that he be seen to get echocardiogram. Pt would like advice, please call back.

## 2014-01-15 NOTE — Telephone Encounter (Signed)
Advised and scheduled

## 2014-01-16 ENCOUNTER — Other Ambulatory Visit (HOSPITAL_BASED_OUTPATIENT_CLINIC_OR_DEPARTMENT_OTHER): Payer: Medicare Other | Admitting: Lab

## 2014-01-16 ENCOUNTER — Telehealth: Payer: Self-pay | Admitting: Hematology & Oncology

## 2014-01-16 ENCOUNTER — Telehealth: Payer: Self-pay | Admitting: *Deleted

## 2014-01-16 ENCOUNTER — Other Ambulatory Visit: Payer: Self-pay | Admitting: *Deleted

## 2014-01-16 DIAGNOSIS — D4620 Refractory anemia with excess of blasts, unspecified: Secondary | ICD-10-CM

## 2014-01-16 DIAGNOSIS — I4891 Unspecified atrial fibrillation: Secondary | ICD-10-CM

## 2014-01-16 LAB — PROTIME-INR (CHCC SATELLITE)
INR: 3.9 — ABNORMAL HIGH (ref 2.0–3.5)
Protime: 46.8 Seconds — ABNORMAL HIGH (ref 10.6–13.4)

## 2014-01-16 NOTE — Telephone Encounter (Addendum)
Message left on patient voice mail and message sent to scheduler.   ----- Message from [name], MD sent at 01/16/2014  3:01 PM EST ----- Call - INR is a little high but no change in coumadin.  Need to repeat INR in 2 weeks.  pete

## 2014-01-16 NOTE — Telephone Encounter (Signed)
Per order for patient to repeat INR in 2weeks.  Patient was sch for 01/30/14.  i called patient and gave patient apt date and time.  Patient is aware of 01/30/14 apt and states he will be here.

## 2014-01-17 ENCOUNTER — Ambulatory Visit (HOSPITAL_COMMUNITY): Payer: Medicare Other | Attending: Cardiology | Admitting: Cardiology

## 2014-01-17 DIAGNOSIS — I509 Heart failure, unspecified: Secondary | ICD-10-CM | POA: Insufficient documentation

## 2014-01-17 DIAGNOSIS — I5022 Chronic systolic (congestive) heart failure: Secondary | ICD-10-CM

## 2014-01-17 DIAGNOSIS — I080 Rheumatic disorders of both mitral and aortic valves: Secondary | ICD-10-CM | POA: Insufficient documentation

## 2014-01-17 DIAGNOSIS — I371 Nonrheumatic pulmonary valve insufficiency: Secondary | ICD-10-CM | POA: Insufficient documentation

## 2014-01-17 DIAGNOSIS — I4891 Unspecified atrial fibrillation: Secondary | ICD-10-CM | POA: Insufficient documentation

## 2014-01-17 DIAGNOSIS — R9430 Abnormal result of cardiovascular function study, unspecified: Secondary | ICD-10-CM | POA: Diagnosis not present

## 2014-01-17 DIAGNOSIS — I313 Pericardial effusion (noninflammatory): Secondary | ICD-10-CM | POA: Insufficient documentation

## 2014-01-17 DIAGNOSIS — R42 Dizziness and giddiness: Secondary | ICD-10-CM | POA: Diagnosis not present

## 2014-01-17 DIAGNOSIS — I442 Atrioventricular block, complete: Secondary | ICD-10-CM

## 2014-01-17 DIAGNOSIS — Z87891 Personal history of nicotine dependence: Secondary | ICD-10-CM | POA: Insufficient documentation

## 2014-01-17 NOTE — Progress Notes (Signed)
Echo performed. 

## 2014-01-18 ENCOUNTER — Telehealth: Payer: Self-pay | Admitting: Cardiology

## 2014-01-18 NOTE — Telephone Encounter (Signed)
New Msg ° ° ° ° ° ° ° ° °Pt returning call from today.  ° ° °Please call back. °

## 2014-01-18 NOTE — Telephone Encounter (Signed)
Notified of echo results.

## 2014-01-25 ENCOUNTER — Ambulatory Visit (INDEPENDENT_AMBULATORY_CARE_PROVIDER_SITE_OTHER): Payer: Medicare Other | Admitting: Cardiology

## 2014-01-25 ENCOUNTER — Encounter: Payer: Self-pay | Admitting: Cardiology

## 2014-01-25 VITALS — BP 118/62 | HR 82 | Ht 74.0 in | Wt 211.0 lb

## 2014-01-25 DIAGNOSIS — I482 Chronic atrial fibrillation: Secondary | ICD-10-CM

## 2014-01-25 DIAGNOSIS — R42 Dizziness and giddiness: Secondary | ICD-10-CM

## 2014-01-25 DIAGNOSIS — I4821 Permanent atrial fibrillation: Secondary | ICD-10-CM

## 2014-01-25 DIAGNOSIS — I442 Atrioventricular block, complete: Secondary | ICD-10-CM

## 2014-01-25 NOTE — Progress Notes (Signed)
[name] Date of Birth:  09/13/1927 [place] [facility] [address] [place], [place]  [address] [phone]        Fax   [phone]   History of Present Illness: This pleasant 80 year old gentleman is seen for a scheduled followup office visit. He has a history of chronic congestive heart failure. He was hospitalized for this at [facility] in 2009. He has a past history of established atrial fibrillation and had an ablation of his AV node with implantation of a biventricular pacemaker on 12/28/06 by Dr. Norville Haggard at [facility]. He was part of a research study however and the biventricular function was not turned on until recently when he switched his care to our office after the conclusion of the research study. The patient has been on chronic Coumadin for his atrial fibrillation. The patient has a history of depressed left ventricular function. His previous ejection fraction was 45-50%. A repeat echo was done on 03/23/12 to see if turning on the biventricular function has made any difference. Unfortunately the ejection fraction was 25%.  However, the patient had a more recent echocardiogram on 01/17/14 which showed that his ejection fraction has risen to 30-35%.  Several weeks ago the patient was experiencing some problems with balance and perhaps vertigo.  The symptoms have resolved.  Current Outpatient Prescriptions  Medication Sig Dispense Refill  . alfuzosin (UROXATRAL) 10 MG 24 hr tablet Take 1 tablet by mouth daily.    Marland Kitchen allopurinol (ZYLOPRIM) 300 MG tablet Take 300 mg by mouth daily.    . carvedilol (COREG) 6.25 MG tablet Take 1 tablet (6.25 mg total) by mouth 2 (two) times daily with a meal. 60 tablet 3  . famotidine (PEPCID) 20 MG tablet Take 40 mg by mouth at bedtime.     . finasteride (PROSCAR) 5 MG tablet Take 5 mg by mouth every evening. 5pm    . furosemide (LASIX) 40 MG tablet TAKE ONE TABLET BY MOUTH ONCE DAILY 30 tablet 3  . Multiple  Vitamin (MULTIVITAMIN WITH MINERALS) TABS tablet Take 1 tablet by mouth daily.    Marland Kitchen pyridoxine (B-6) 100 MG tablet Take 100 mg by mouth 2 (two) times daily.     Marland Kitchen warfarin (COUMADIN) 5 MG tablet Take 5 mg by mouth daily. As directed.     No current facility-administered medications for this visit.    Allergies  Allergen Reactions  . Moxifloxacin Hcl In Nacl Other (See Comments) and Itching    Pt c/o being "out of his head" & itching  . Methylprednisolone Other (See Comments)    Dizziness, passed out twice  . Ramipril Other (See Comments)    Renal function worsened on ACEi.    Patient Active Problem List   Diagnosis Date Noted  . Pleural effusion   . Blood poisoning   . CAP (community acquired pneumonia) 10/19/2013  . Sepsis 10/19/2013  . Permanent atrial fibrillation 03/13/2013  . Stage III chronic kidney disease 02/06/2013  . Anemia 02/06/2013  . Ataxia 02/05/2013  . TIA (transient ischemic attack) 02/05/2013  . Complete heart block 09/12/2011  . MDS (myelodysplastic syndrome), low grade 02/10/2011  . Iron overload, transfusional 02/10/2011  . Shoulder impingement syndrome 12/23/2010  . Atrial fibrillation with controlled ventricular response 09/15/2010  . Chronic systolic congestive heart failure 09/15/2010  . Pacemaker 09/15/2010    History  Smoking status  . Former Smoker -- 1.00 packs/day for 25 years  . Types: Cigarettes  . Start  date: 02/23/1944  . Quit date: 02/10/1968  Smokeless tobacco  . Never Used    Comment: quit 44 years ago    History  Alcohol Use No    Comment: 02/06/2013 "drank a little bit from age [age]"    Family History  Problem Relation Age of Onset  . Cancer Mother   . Melanoma Mother   . COPD Father   . Kidney disease Brother     Review of Systems: Constitutional: no fever chills diaphoresis or fatigue or change in weight.  Head and neck: no hearing loss, no epistaxis, no photophobia or visual disturbance. Respiratory: No cough,  shortness of breath or wheezing. Cardiovascular: No chest pain peripheral edema, palpitations. Gastrointestinal: No abdominal distention, no abdominal pain, no change in bowel habits hematochezia or melena. Genitourinary: No dysuria, no frequency, no urgency, no nocturia. Musculoskeletal:No arthralgias, no back pain, no gait disturbance or myalgias. Neurological: No dizziness, no headaches, no numbness, no seizures, no syncope, no weakness, no tremors. Hematologic: No lymphadenopathy, no easy bruising. Psychiatric: No confusion, no hallucinations, no sleep disturbance.    Physical Exam: Filed Vitals:   01/25/14 1321  BP: 118/62  Pulse: 82  The patient appears to be in no distress.  Head and neck exam reveals that the pupils are equal and reactive.  The extraocular movements are full.  There is no scleral icterus.  Mouth and pharynx are benign.  No lymphadenopathy.  No carotid bruits.  The jugular venous pressure is normal.  Thyroid is not enlarged or tender.  Chest is clear to percussion and auscultation.  No rales or rhonchi.  Expansion of the chest is symmetrical.  Pacemaker in left upper chest.  Heart reveals no abnormal lift or heave.  First and second heart sounds are normal.  There is no murmur gallop rub or click.  The abdomen is soft and nontender.  Bowel sounds are normoactive.  There is no hepatosplenomegaly or mass.  There are no abdominal bruits.  Extremities reveal no phlebitis or edema.  Pedal pulses are good.  There is no cyanosis or clubbing.  Compression stockings in place Neurologic exam is normal strength and no lateralizing weakness.  No sensory deficits.  Integument reveals no rash    Assessment / Plan: 1. chronic systolic left ventricular dysfunction.  Recent ejection fraction 30-35% by echocardiogram 01/17/14 2. chronic permanent atrial fibrillation with previous ablation of AV node and with biventricular pacemaker implanted 12/28/06 at [facility] 3.  myelodysplastic syndrome followed by Dr. Marin Olp. 4. past history of gout 5. chronic kidney disease  Disposition: Continue same medication.  Recheck in 6 months office visit

## 2014-01-25 NOTE — Patient Instructions (Signed)
Your physician recommends that you continue on your current medications as directed. Please refer to the Current Medication list given to you today.  Your physician wants you to follow-up in: 6 month ov You will receive a reminder letter in the mail two months in advance. If you don't receive a letter, please call our office to schedule the follow-up appointment.  

## 2014-01-30 ENCOUNTER — Telehealth: Payer: Self-pay | Admitting: *Deleted

## 2014-01-30 ENCOUNTER — Other Ambulatory Visit (HOSPITAL_BASED_OUTPATIENT_CLINIC_OR_DEPARTMENT_OTHER): Payer: Medicare Other | Admitting: Lab

## 2014-01-30 DIAGNOSIS — I4891 Unspecified atrial fibrillation: Secondary | ICD-10-CM

## 2014-01-30 DIAGNOSIS — D4620 Refractory anemia with excess of blasts, unspecified: Secondary | ICD-10-CM

## 2014-01-30 LAB — PROTIME-INR (CHCC SATELLITE)
INR: 3.4 (ref 2.0–3.5)
Protime: 40.8 Seconds — ABNORMAL HIGH (ref 10.6–13.4)

## 2014-01-30 NOTE — Telephone Encounter (Addendum)
Called and spoke with patient. Appointment scheduled  ----- Message from [name], MD sent at 01/30/2014 12:55 PM EST ----- Call and another the INR is 3.4. I would keep him on the same dose of Coumadin. I would recheck his INR in 3 weeks.

## 2014-02-14 ENCOUNTER — Encounter: Payer: Self-pay | Admitting: Internal Medicine

## 2014-02-14 ENCOUNTER — Ambulatory Visit (INDEPENDENT_AMBULATORY_CARE_PROVIDER_SITE_OTHER): Payer: Medicare Other | Admitting: *Deleted

## 2014-02-14 DIAGNOSIS — I4821 Permanent atrial fibrillation: Secondary | ICD-10-CM

## 2014-02-14 DIAGNOSIS — I482 Chronic atrial fibrillation: Secondary | ICD-10-CM

## 2014-02-14 LAB — MDC_IDC_ENUM_SESS_TYPE_REMOTE
Battery Remaining Longevity: 57 mo
Brady Statistic AS VP Percent: 88.66 %
Brady Statistic AS VS Percent: 11.34 %
Brady Statistic RA Percent Paced: 0 %
Brady Statistic RV Percent Paced: 88.66 %
Lead Channel Impedance Value: 4047 Ohm
Lead Channel Impedance Value: 4047 Ohm
Lead Channel Impedance Value: 4047 Ohm
Lead Channel Impedance Value: 589 Ohm
Lead Channel Impedance Value: 684 Ohm
Lead Channel Pacing Threshold Amplitude: 1 V
Lead Channel Setting Pacing Amplitude: 5 V
Lead Channel Setting Pacing Pulse Width: 1 ms
Lead Channel Setting Sensing Sensitivity: 5.6 mV
MDC IDC MSMT BATTERY VOLTAGE: 3 V
MDC IDC MSMT LEADCHNL LV IMPEDANCE VALUE: 4047 Ohm
MDC IDC MSMT LEADCHNL RA IMPEDANCE VALUE: 4047 Ohm
MDC IDC MSMT LEADCHNL RV IMPEDANCE VALUE: 3762 Ohm
MDC IDC MSMT LEADCHNL RV IMPEDANCE VALUE: 3819 Ohm
MDC IDC MSMT LEADCHNL RV PACING THRESHOLD PULSEWIDTH: 0.4 ms
MDC IDC MSMT LEADCHNL RV SENSING INTR AMPL: 15.5 mV
MDC IDC SESS DTM: 20160127215043
MDC IDC SET LEADCHNL LV PACING AMPLITUDE: 2.5 V
MDC IDC SET LEADCHNL LV PACING PULSEWIDTH: 0.8 ms
MDC IDC SET ZONE DETECTION INTERVAL: 350 ms
MDC IDC STAT BRADY AP VP PERCENT: 0 %
MDC IDC STAT BRADY AP VS PERCENT: 0 %
Zone Setting Detection Interval: 400 ms

## 2014-02-14 NOTE — Progress Notes (Signed)
Remote pacemaker transmission.   

## 2014-02-20 ENCOUNTER — Other Ambulatory Visit (HOSPITAL_BASED_OUTPATIENT_CLINIC_OR_DEPARTMENT_OTHER): Payer: Medicare Other | Admitting: Lab

## 2014-02-20 DIAGNOSIS — D4620 Refractory anemia with excess of blasts, unspecified: Secondary | ICD-10-CM

## 2014-02-20 DIAGNOSIS — I4821 Permanent atrial fibrillation: Secondary | ICD-10-CM

## 2014-02-20 DIAGNOSIS — I4891 Unspecified atrial fibrillation: Secondary | ICD-10-CM

## 2014-02-20 LAB — PROTIME-INR (CHCC SATELLITE)
INR: 2.6 (ref 2.0–3.5)
Protime: 31.2 Seconds — ABNORMAL HIGH (ref 10.6–13.4)

## 2014-02-27 ENCOUNTER — Telehealth: Payer: Self-pay | Admitting: Cardiology

## 2014-02-27 NOTE — Telephone Encounter (Signed)
Refill Request:   Pt requesting a refill of Carvedilol.

## 2014-02-28 ENCOUNTER — Other Ambulatory Visit: Payer: Self-pay

## 2014-02-28 MED ORDER — CARVEDILOL 6.25 MG PO TABS: 6.2500 mg | ORAL_TABLET | Freq: Two times a day (BID) | ORAL | Status: DC

## 2014-03-07 ENCOUNTER — Telehealth: Payer: Self-pay | Admitting: Internal Medicine

## 2014-03-07 NOTE — Telephone Encounter (Signed)
Device clinic brought me a copy of the patient's most recent remote transmission that came in on 02/14/14. Dr. Rayann Heman reviewed and wanted the patient enrolled in the [facility] clinic. Optivol readings have been elevated from ~ November through present. The patient states that he was in the hospital at [facility] on 10/1 (LOS- 7 days total). He feels as though his breathing is ok. His baseline weight is ~ 204 lbs, but he has been up as high as 208 lbs. He does have some lower extremity swelling, but he wears support hose and this does help him. He currently takes lasix 40 mg once daily. I have advised him to increase his lasix to 40 mg two tablets by mouth daily x 3 days then resume one tablet by mouth once daily. He is aware to call back with any concerns. Will do an ICM follow up call in ~ 3 weeks.

## 2014-03-09 ENCOUNTER — Telehealth: Payer: Self-pay | Admitting: Cardiology

## 2014-03-09 NOTE — Telephone Encounter (Signed)
New problem    Pt stated he is having severe rt side back pain and want to be seen in office today. Pt thinks he might have fluid in back.  Please advise pt.

## 2014-03-09 NOTE — Telephone Encounter (Signed)
Spoke with patients wife and she stated patient went to Urgent Care and they thought Gallstones, did lab and xray Patient continues to have sharp side pain intermittently.  Discussed with  Dr. Mare Ferrari and he recommended ED (on 68) or Urgent Care Advised patient, verbalized understanding

## 2014-03-10 ENCOUNTER — Emergency Department (HOSPITAL_BASED_OUTPATIENT_CLINIC_OR_DEPARTMENT_OTHER)
Admission: EM | Admit: 2014-03-10 | Discharge: 2014-03-10 | Disposition: A | Discharge status: Home / Self Care | Payer: Medicare Other | Attending: Emergency Medicine | Admitting: Emergency Medicine

## 2014-03-10 ENCOUNTER — Emergency Department (HOSPITAL_BASED_OUTPATIENT_CLINIC_OR_DEPARTMENT_OTHER): Payer: Medicare Other

## 2014-03-10 ENCOUNTER — Encounter (HOSPITAL_BASED_OUTPATIENT_CLINIC_OR_DEPARTMENT_OTHER): Payer: Self-pay | Admitting: Emergency Medicine

## 2014-03-10 DIAGNOSIS — Z87891 Personal history of nicotine dependence: Secondary | ICD-10-CM | POA: Insufficient documentation

## 2014-03-10 DIAGNOSIS — R079 Chest pain, unspecified: Secondary | ICD-10-CM

## 2014-03-10 DIAGNOSIS — Z8709 Personal history of other diseases of the respiratory system: Secondary | ICD-10-CM | POA: Insufficient documentation

## 2014-03-10 DIAGNOSIS — Z7901 Long term (current) use of anticoagulants: Secondary | ICD-10-CM | POA: Insufficient documentation

## 2014-03-10 DIAGNOSIS — Z862 Personal history of diseases of the blood and blood-forming organs and certain disorders involving the immune mechanism: Secondary | ICD-10-CM | POA: Diagnosis not present

## 2014-03-10 DIAGNOSIS — M546 Pain in thoracic spine: Secondary | ICD-10-CM | POA: Diagnosis not present

## 2014-03-10 DIAGNOSIS — R0789 Other chest pain: Secondary | ICD-10-CM | POA: Insufficient documentation

## 2014-03-10 DIAGNOSIS — I509 Heart failure, unspecified: Secondary | ICD-10-CM | POA: Diagnosis not present

## 2014-03-10 DIAGNOSIS — Z8669 Personal history of other diseases of the nervous system and sense organs: Secondary | ICD-10-CM | POA: Insufficient documentation

## 2014-03-10 DIAGNOSIS — M109 Gout, unspecified: Secondary | ICD-10-CM | POA: Insufficient documentation

## 2014-03-10 DIAGNOSIS — Z79899 Other long term (current) drug therapy: Secondary | ICD-10-CM | POA: Insufficient documentation

## 2014-03-10 DIAGNOSIS — Z8701 Personal history of pneumonia (recurrent): Secondary | ICD-10-CM | POA: Diagnosis not present

## 2014-03-10 DIAGNOSIS — Z87442 Personal history of urinary calculi: Secondary | ICD-10-CM | POA: Diagnosis not present

## 2014-03-10 MED ORDER — DICLOFENAC EPOLAMINE 1.3 % TD PTCH: 1.0000 | MEDICATED_PATCH | Freq: Two times a day (BID) | TRANSDERMAL | Status: DC

## 2014-03-10 NOTE — ED Notes (Signed)
Patient states that he has left mid back back pain x 2 weeks that is worse with movement

## 2014-03-10 NOTE — ED Notes (Signed)
Patient reports that he was seen at a [facility] week ago with nothing to clue him into what was going on. Was told to come back if the pain was worse.

## 2014-03-10 NOTE — Discharge Instructions (Signed)
As discussed, your evaluation today has been largely reassuring.  But, it is important that you monitor your condition carefully, and do not hesitate to return to the ED if you develop new, or concerning changes in your condition.  You have started new medication, it is important to take this as directed.  In addition to the new medication, please use ice packs, on the painful area, 4 times daily for the next 3 days.  Please follow-up with our sports-medicine physician for appropriate ongoing care.

## 2014-03-10 NOTE — ED Provider Notes (Signed)
CSN: [id number]     Arrival date & time 03/10/14  0708 History   First MD Initiated Contact with Patient 03/10/14 [phone]     Chief Complaint  Patient presents with  . Back Pain    HPI  Patient presents with ongoing pain in the right mid thoracic area.  Patient denies trauma or fall. Onset was about one week ago.  Since onset the pain is been persistent, worse with activity, motion, minimally better after resting. 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 No concurrent dyspnea, anterior chest pain, lightheadedness, syncope, fever, chills, cough. No weight loss, weight gain. Patient was seen 1 week ago at urgent care, but states that he is no better, in spite of using appropriate medication.   Past Medical History  Diagnosis Date  . Bursitis     OF THE RIGHT SHOULDER  . Tinnitus   . Permanent atrial fibrillation     s/p AV nodal ablation  . Depressed ejection fraction 10    EF previously 45-50%, s/p AV nodal ablation and biv pacemaker implant, previously enrolled in Block HF and was randomized to RV pacing only)  . Chronic anemia     RECIEVES ARANESP SHOTS IF HIS HEMOGLOBIN FALLS BELOW 11  . Blood transfusion     "several; Dr. Deberah Pelton" (02/06/2013)  . GERD (gastroesophageal reflux disease)   . MDS (myelodysplastic syndrome), low grade 02/10/2011  . Iron overload, transfusional 02/10/2011  . Complete heart block 12/28/06    s/p AV nodal ablation and BIV pacemaker implant at [facility] by Dr Rosita Fire  . CHF (congestive heart failure)   . Chronic bronchitis   . Pneumonia     "high school; [employer]; twice since, last time 12/ 2014" (02/06/2013)  . H/O hiatal hernia   . Arthritis     "hands, back" (02/06/2013)  . Gout   . Kidney stones 1985   Past Surgical History  Procedure Laterality Date  . Total knee arthroplasty Bilateral     right Dec.2011,left June 2012  . Joint  replacement    . Pilonidal cyst excision  1970's  . Kidney stone surgery  1985    "tried to go up my penis; had to cut my stomach and go in for lodged stone"  . Av node ablation  12/08    at [facility]  . Insert / replace / remove pacemaker  12/28/2006; 04/11/2013    MDT CRTP implanted 2008 by Dr Rosita Fire at [facility]; gen change 03/2013 by Dr Lovena Le  . Bunionectomy Bilateral 11/2012- 01/2013    "left-right; great toes; gout and bunion OR" (02/06/2013)  . Inguinal hernia repair Right   . Lithotripsy  1985  . Kidney stone surgery  1986    "put a tube in my back"  . Cataract extraction w/ intraocular lens  implant, bilateral Bilateral   . Permanent pacemaker generator change N/A 04/11/2013    Procedure: PERMANENT PACEMAKER GENERATOR CHANGE;  Surgeon: Evans Lance, MD;  Location: [facility] CATH LAB;  Service: Cardiovascular;  Laterality: N/A;   Family History  Problem Relation Age of Onset  . Cancer Mother   . Melanoma Mother   . COPD Father   . Kidney disease Brother    History  Substance Use Topics  . Smoking status: Former Smoker -- 1.00 packs/day for 25 years    Types: Cigarettes    Start date: 02/23/1944    Quit date:  02/10/1968  . Smokeless tobacco: Never Used     Comment: quit 44 years ago  . Alcohol Use: No     Comment: 02/06/2013 "drank a little bit from age [age]"    Review of Systems  Constitutional:       Per HPI, otherwise negative  HENT:       Per HPI, otherwise negative  Respiratory:       Per HPI, otherwise negative  Cardiovascular:       Per HPI, otherwise negative  Gastrointestinal: Negative for vomiting.  Endocrine:       Negative aside from HPI  Genitourinary:       Neg aside from HPI   Musculoskeletal: Positive for back pain.       Per HPI, otherwise negative  Skin: Negative for color change and rash.  Neurological: Negative for syncope, weakness and light-headedness.      Allergies  Moxifloxacin hcl in nacl; Methylprednisolone; and  Ramipril  Home Medications   Prior to Admission medications   Medication Sig Start Date End Date Taking? Authorizing Provider  alfuzosin (UROXATRAL) 10 MG 24 hr tablet Take 1 tablet by mouth daily. 07/22/13   Historical Provider, MD  allopurinol (ZYLOPRIM) 300 MG tablet Take 300 mg by mouth daily. 07/26/13   Historical Provider, MD  carvedilol (COREG) 6.25 MG tablet Take 1 tablet (6.25 mg total) by mouth 2 (two) times daily with a meal. 02/28/14   Darlin Coco, MD  famotidine (PEPCID) 20 MG tablet Take 40 mg by mouth at bedtime.     Historical Provider, MD  finasteride (PROSCAR) 5 MG tablet Take 5 mg by mouth every evening. 5pm    Historical Provider, MD  furosemide (LASIX) 40 MG tablet TAKE ONE TABLET BY MOUTH ONCE DAILY 12/12/13   Darlin Coco, MD  Multiple Vitamin (MULTIVITAMIN WITH MINERALS) TABS tablet Take 1 tablet by mouth daily.    Historical Provider, MD  pyridoxine (B-6) 100 MG tablet Take 100 mg by mouth 2 (two) times daily.     Historical Provider, MD  warfarin (COUMADIN) 5 MG tablet Take 5 mg by mouth daily. As directed.    Historical Provider, MD   BP 112/58 mmHg  Pulse 85  Temp(Src) 97.6 F (36.4 C) (Oral)  Resp 18  Ht 6\' 2"  (1.88 m)  Wt 207 lb (93.895 kg)  BMI 26.57 kg/m2  SpO2 99% Physical Exam  Constitutional: He is oriented to person, place, and time. He appears well-developed. No distress.  HENT:  Head: Normocephalic and atraumatic.  Eyes: Conjunctivae and EOM are normal.  Cardiovascular: Normal rate and regular rhythm.   Pulmonary/Chest: Effort normal. No stridor. No respiratory distress.  Abdominal: He exhibits no distension.  Musculoskeletal: He exhibits no edema.       Arms: Neurological: He is alert and oriented to person, place, and time.  Skin: Skin is warm and dry.     Psychiatric: He has a normal mood and affect.  Nursing note and vitals reviewed.   ED Course  Procedures (including critical care time)  Imaging Review I reviewed the x-ray  images, occurring with the interpretation.  On repeat exam the patient is calm, no distress.  I discussed all findings the patient and his wife.  He'll be started on a course of medication, follow-up with orthopedics. MDM   Patient presents with ongoing mid thoracic pain.  Given the patient's age, lack of clear precipitant, x-ray was performed to exclude osteo-lesions. X-ray was unremarkable, vital signs unremarkable, patient was appropriate  for discharge with further evaluation, management to occur as an outpatient.    Carmin Muskrat, MD 03/10/14 [id number]

## 2014-03-12 ENCOUNTER — Encounter: Payer: Self-pay | Admitting: Hematology & Oncology

## 2014-03-12 ENCOUNTER — Ambulatory Visit (HOSPITAL_BASED_OUTPATIENT_CLINIC_OR_DEPARTMENT_OTHER): Payer: Medicare Other | Admitting: Hematology & Oncology

## 2014-03-12 ENCOUNTER — Other Ambulatory Visit (HOSPITAL_BASED_OUTPATIENT_CLINIC_OR_DEPARTMENT_OTHER): Payer: Medicare Other | Admitting: Lab

## 2014-03-12 VITALS — BP 95/59 | HR 87 | Temp 97.4°F | Resp 20 | Ht 74.0 in | Wt 207.0 lb

## 2014-03-12 DIAGNOSIS — D46Z Other myelodysplastic syndromes: Secondary | ICD-10-CM

## 2014-03-12 DIAGNOSIS — I4891 Unspecified atrial fibrillation: Secondary | ICD-10-CM

## 2014-03-12 DIAGNOSIS — D4620 Refractory anemia with excess of blasts, unspecified: Secondary | ICD-10-CM

## 2014-03-12 LAB — MANUAL DIFFERENTIAL (CHCC SATELLITE)
ALC: 2.5 10*3/uL (ref 0.9–3.3)
ANC (CHCC HP manual diff): 2.8 10*3/uL (ref 1.5–6.5)
BASO: 1 % (ref 0–2)
Eos: 1 % (ref 0–7)
LYMPH: 39 % (ref 14–48)
MONO: 15 % — ABNORMAL HIGH (ref 0–13)
PLT EST ~~LOC~~: DECREASED
SEG: 44 % (ref 40–75)

## 2014-03-12 LAB — CMP (CANCER CENTER ONLY)
ALBUMIN: 3.9 g/dL (ref 3.3–5.5)
ALT: 22 U/L (ref 10–47)
AST: 28 U/L (ref 11–38)
Alkaline Phosphatase: 71 U/L (ref 26–84)
BUN, Bld: 24 mg/dL — ABNORMAL HIGH (ref 7–22)
CALCIUM: 9.2 mg/dL (ref 8.0–10.3)
CHLORIDE: 102 meq/L (ref 98–108)
CO2: 28 meq/L (ref 18–33)
Creat: 1.9 mg/dl — ABNORMAL HIGH (ref 0.6–1.2)
GLUCOSE: 91 mg/dL (ref 73–118)
Potassium: 4.1 mEq/L (ref 3.3–4.7)
Sodium: 143 mEq/L (ref 128–145)
Total Bilirubin: 1.3 mg/dl (ref 0.20–1.60)
Total Protein: 7.3 g/dL (ref 6.4–8.1)

## 2014-03-12 LAB — RETICULOCYTES (CHCC)
ABS RETIC: 18.6 10*3/uL — AB (ref 19.0–186.0)
RBC.: 3.71 MIL/uL — ABNORMAL LOW (ref 4.22–5.81)
Retic Ct Pct: 0.5 % (ref 0.4–2.3)

## 2014-03-12 LAB — CBC WITH DIFFERENTIAL (CANCER CENTER ONLY)
HCT: 35.3 % — ABNORMAL LOW (ref 38.7–49.9)
HGB: 11.4 g/dL — ABNORMAL LOW (ref 13.0–17.1)
MCH: 31 pg (ref 28.0–33.4)
MCHC: 32.3 g/dL (ref 32.0–35.9)
MCV: 96 fL (ref 82–98)
Platelets: 125 10e3/uL — ABNORMAL LOW (ref 145–400)
RBC: 3.68 10e6/uL — ABNORMAL LOW (ref 4.20–5.70)
RDW: 14.4 % (ref 11.1–15.7)
WBC: 6.4 10e3/uL (ref 4.0–10.0)

## 2014-03-12 LAB — IRON AND TIBC CHCC
%SAT: 50 % (ref 20–55)
Iron: 97 ug/dL (ref 42–163)
TIBC: 194 ug/dL — ABNORMAL LOW (ref 202–409)
UIBC: 97 ug/dL — ABNORMAL LOW (ref 117–376)

## 2014-03-12 LAB — FERRITIN CHCC: Ferritin: 1312 ng/mL — ABNORMAL HIGH (ref 22–316)

## 2014-03-12 LAB — PROTIME-INR (CHCC SATELLITE)
INR: 3.6 — ABNORMAL HIGH (ref 2.0–3.5)
Protime: 43.2 s — ABNORMAL HIGH (ref 10.6–13.4)

## 2014-03-12 NOTE — Progress Notes (Signed)
Hematology and Oncology Follow Up Visit  FAYE SANFILIPPO [id number] 18-Jan-1928 81 y.o. 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  Lenhoff is back for followup. He is feeling better. He got through the Christmas holidays without any problems.  He's had no bleeding. He's had no abdominal pain. He's had no cough. He's had some arthritic issues. He has some back discomfort. He pulled muscle in his back over the weekend.  There has been no rashes. He's had no leg swelling. He's had no headache.  His blood iron studies done back in December showed a ferritin of 1620 with an iron saturation 71%. We will have to watch this closely. At some point, we may have to think about iron chelation therapy.  Overall, his performance status is ECOG 1 Medications:  Current outpatient prescriptions:  .  allopurinol (ZYLOPRIM) 300 MG tablet, Take 300 mg by mouth daily., Disp: , Rfl:  .  carvedilol (COREG) 6.25 MG tablet, Take 1 tablet (6.25 mg total) by mouth 2 (two) times daily with a meal., Disp: 60 tablet, Rfl: 6 .  doxazosin (CARDURA XL) 4 MG 24 hr tablet, Take 4 mg by mouth daily with breakfast., Disp: , Rfl:  .  famotidine (PEPCID) 20 MG tablet, Take 40 mg by mouth at bedtime. , Disp: , Rfl:  .  finasteride (PROSCAR) 5 MG tablet, Take 5 mg by mouth every evening. 5pm, Disp: , Rfl:  .  furosemide (LASIX) 40 MG tablet, TAKE ONE TABLET BY MOUTH ONCE DAILY, Disp: 30 tablet, Rfl: 3 .  Multiple Vitamin (MULTIVITAMIN WITH MINERALS) TABS tablet, Take 1 tablet by mouth daily., Disp: , Rfl:  .  pyridoxine (B-6) 100 MG tablet, Take 100 mg by mouth 2 (two) times daily. , Disp: , Rfl:  .  warfarin (COUMADIN) 5 MG tablet, Take 5 mg by mouth daily. As directed., Disp: , Rfl:   Allergies:  Allergies  Allergen Reactions  .  Moxifloxacin Hcl In Nacl Other (See Comments) and Itching    Pt c/o being "out of his head" & itching  . Methylprednisolone Other (See Comments)    Dizziness, passed out twice  . Ramipril Other (See Comments)    Renal function worsened on ACEi.    Past Medical History, Surgical history, Social history, and Family History were reviewed and updated.  Review of Systems: As above  Physical Exam:  height is 6\' 2"  (1.88 m) and weight is 207 lb (93.895 kg). His oral temperature is 97.4 F (36.3 C). His blood pressure is 95/59 and his pulse is 87. His respiration is 20.   Well-developed and well-nourished white woman. Head and neck exam shows no ocular or oral lesion.  He has no palpable cervical or supraclavicular lymph nodes. Lungs are clear. He does arouse, wheezes or rhonchi. Cardiac exam is regular rate and rhythm. He has an occasional extra beat. His he has a 1/6 systolic murmur. Abdomen soft. Has good bowel sounds. There is no fluid wave. There is no palpable liver or spleen tip. Extremities shows no clubbing, cyanosis or edema. His compression stockings on his legs. Skin exam shows no rashes, ecchymosis or petechia. Neurological exam shows no focal neurological deficits.  Lab Results  Component Value Date   WBC 6.4 03/12/2014   HGB 11.4* 03/12/2014   HCT  35.3* 03/12/2014   MCV 96 03/12/2014   PLT 125* 03/12/2014     Chemistry      Component Value Date/Time   NA 143 03/12/2014 0937   NA 141 10/26/2013 0430   K 4.1 03/12/2014 0937   K 3.8 10/26/2013 0430   CL 102 03/12/2014 0937   CL 105 10/26/2013 0430   CO2 28 03/12/2014 0937   CO2 24 10/26/2013 0430   BUN 24* 03/12/2014 0937   BUN 25* 10/26/2013 0430   CREATININE 1.9* 03/12/2014 0937   CREATININE 1.57* 10/26/2013 0430      Component Value Date/Time   CALCIUM 9.2 03/12/2014 0937   CALCIUM 8.9 10/26/2013 0430   ALKPHOS 71 03/12/2014 0937   ALKPHOS 218* 10/26/2013 0430   AST 28 03/12/2014 0937   AST 44* 10/26/2013 0430     ALT 22 03/12/2014 0937   ALT 69* 10/26/2013 0430   BILITOT 1.30 03/12/2014 0937   BILITOT 1.1 10/26/2013 0430         Impression and Plan: Mr. Spinnato is a 81 year old gentleman. He has refractory anemia. We do not have to give him any Aranesp. He's done incredibly well with this.  On his blood smear, I do not see any evidence of transition over to leukemia.  His INR is 3.6. For him, I think this is okay. I don't think we have to make any changes.  His platelet count is holding steady.  We will plan to him back here in another 8 weeks now.  I spent about 30 minutes with him today.   [name], MD 2/22/201610:46 AM

## 2014-03-29 ENCOUNTER — Telehealth: Payer: Self-pay | Admitting: Cardiology

## 2014-03-29 ENCOUNTER — Telehealth: Payer: Self-pay | Admitting: Internal Medicine

## 2014-03-29 ENCOUNTER — Encounter: Payer: Medicare Other | Admitting: *Deleted

## 2014-03-29 NOTE — Telephone Encounter (Signed)
New problem   Pt want to know if his device check was sent.

## 2014-03-29 NOTE — Telephone Encounter (Signed)
New problem   Pt want to know is his device was sent.

## 2014-03-29 NOTE — Telephone Encounter (Signed)
Informed patient that transmission was received. Patient voiced understanding.

## 2014-03-29 NOTE — Telephone Encounter (Signed)
LMOVM reminding pt to send remote transmission.   

## 2014-04-03 ENCOUNTER — Telehealth: Payer: Self-pay | Admitting: Internal Medicine

## 2014-04-03 NOTE — Telephone Encounter (Signed)
New message       1. Has your device fired? no  2. Is you device beeping? no  3. Are you experiencing draining or swelling at device site? no 4. Are you calling to see if we received your device transmission? no 5. Have you passed out? No Pt want the device clinic to know that he was mowing the yard and began feeling dizzy.  Could it be from his pacemaker?

## 2014-04-04 NOTE — Telephone Encounter (Signed)
LMOVM for pt to return call 

## 2014-04-04 NOTE — Telephone Encounter (Signed)
Pt has appt w/ JA on 3-21

## 2014-04-05 ENCOUNTER — Other Ambulatory Visit: Payer: Self-pay | Admitting: Family

## 2014-04-09 ENCOUNTER — Encounter: Payer: Self-pay | Admitting: Internal Medicine

## 2014-04-09 ENCOUNTER — Ambulatory Visit (INDEPENDENT_AMBULATORY_CARE_PROVIDER_SITE_OTHER): Payer: Medicare Other | Admitting: Internal Medicine

## 2014-04-09 ENCOUNTER — Encounter: Payer: Self-pay | Admitting: *Deleted

## 2014-04-09 VITALS — BP 110/60 | HR 85 | Ht 74.0 in | Wt 210.4 lb

## 2014-04-09 DIAGNOSIS — I4891 Unspecified atrial fibrillation: Secondary | ICD-10-CM

## 2014-04-09 DIAGNOSIS — I442 Atrioventricular block, complete: Secondary | ICD-10-CM | POA: Diagnosis not present

## 2014-04-09 DIAGNOSIS — I5022 Chronic systolic (congestive) heart failure: Secondary | ICD-10-CM | POA: Diagnosis not present

## 2014-04-09 LAB — CBC WITH DIFFERENTIAL/PLATELET
HCT: 33.9 % — ABNORMAL LOW (ref 39.0–52.0)
HEMOGLOBIN: 11.2 g/dL — AB (ref 13.0–17.0)
MCHC: 33 g/dL (ref 30.0–36.0)
MCV: 93.3 fl (ref 78.0–100.0)
PLATELETS: 127 10*3/uL — AB (ref 150.0–400.0)
RBC: 3.63 Mil/uL — AB (ref 4.22–5.81)
RDW: 15.7 % — ABNORMAL HIGH (ref 11.5–15.5)
WBC: 7.8 10*3/uL (ref 4.0–10.5)

## 2014-04-09 LAB — MDC_IDC_ENUM_SESS_TYPE_INCLINIC
Battery Remaining Longevity: 57 mo
Battery Voltage: 3 V
Brady Statistic AP VP Percent: 0 %
Brady Statistic AP VS Percent: 0 %
Brady Statistic AS VP Percent: 89.38 %
Brady Statistic AS VS Percent: 10.62 %
Brady Statistic RV Percent Paced: 89.38 %
Date Time Interrogation Session: 20160321165351
Lead Channel Impedance Value: 3610 Ohm
Lead Channel Impedance Value: 3952 Ohm
Lead Channel Impedance Value: 4047 Ohm
Lead Channel Impedance Value: 4047 Ohm
Lead Channel Impedance Value: 684 Ohm
Lead Channel Pacing Threshold Amplitude: 4.5 V
Lead Channel Pacing Threshold Pulse Width: 0.8 ms
Lead Channel Pacing Threshold Pulse Width: 1 ms
Lead Channel Sensing Intrinsic Amplitude: 15.625 mV
Lead Channel Setting Pacing Amplitude: 2.5 V
Lead Channel Setting Pacing Amplitude: 5 V
Lead Channel Setting Pacing Pulse Width: 0.8 ms
Lead Channel Setting Pacing Pulse Width: 1 ms
MDC IDC MSMT LEADCHNL LV IMPEDANCE VALUE: 4047 Ohm
MDC IDC MSMT LEADCHNL LV IMPEDANCE VALUE: 608 Ohm
MDC IDC MSMT LEADCHNL LV PACING THRESHOLD AMPLITUDE: 1.25 V
MDC IDC MSMT LEADCHNL RA IMPEDANCE VALUE: 4047 Ohm
MDC IDC MSMT LEADCHNL RA IMPEDANCE VALUE: 4047 Ohm
MDC IDC SET LEADCHNL RV SENSING SENSITIVITY: 11.3 mV
MDC IDC SET ZONE DETECTION INTERVAL: 350 ms
MDC IDC STAT BRADY RA PERCENT PACED: 0 %
Zone Setting Detection Interval: 400 ms

## 2014-04-09 LAB — PROTIME-INR
INR: 1.8 ratio — ABNORMAL HIGH (ref 0.8–1.0)
PROTHROMBIN TIME: 19.2 s — AB (ref 9.6–13.1)

## 2014-04-09 LAB — BASIC METABOLIC PANEL
BUN: 25 mg/dL — ABNORMAL HIGH (ref 6–23)
CO2: 30 meq/L (ref 19–32)
Calcium: 9.2 mg/dL (ref 8.4–10.5)
Chloride: 105 mEq/L (ref 96–112)
Creatinine, Ser: 1.73 mg/dL — ABNORMAL HIGH (ref 0.40–1.50)
GFR: 40.12 mL/min — AB (ref >60.00)
Glucose, Bld: 81 mg/dL (ref 70–99)
POTASSIUM: 4 meq/L (ref 3.5–5.1)
SODIUM: 139 meq/L (ref 135–145)

## 2014-04-09 NOTE — Progress Notes (Signed)
PCP: Pcp Not In System Primary Cardiologist: [name] is a 81 y.o. male who presents today for routine electrophysiology followup.  Since recent generator change, the patient reports doing very well.  He remains active.  He has noticed occasional postural dizziness.  He denies presyncope or syncope.  Recent remote device transmission has revealed that his RV pacing lead has fractured.  He is therefore brought to the device clinic today as an add on for further discussion/ evaluation. Today, he denies symptoms of palpitations, chest pain, shortness of breath,  lower extremity edema, dizziness, presyncope, or syncope.  The patient is otherwise without complaint today.   Past Medical History  Diagnosis Date  . Bursitis     OF THE RIGHT SHOULDER  . Tinnitus   . Permanent atrial fibrillation     s/p AV nodal ablation  . Depressed ejection fraction 10    EF previously 45-50%, s/p AV nodal ablation and biv pacemaker implant, previously enrolled in Block HF and was randomized to RV pacing only)  . Chronic anemia     RECIEVES ARANESP SHOTS IF HIS HEMOGLOBIN FALLS BELOW 11  . Blood transfusion     "several; Dr. Deberah Marks" (02/06/2013)  . GERD (gastroesophageal reflux disease)   . MDS (myelodysplastic syndrome), low grade 02/10/2011  . Iron overload, transfusional 02/10/2011  . Complete heart block 12/28/06    s/p AV nodal ablation and BIV pacemaker implant at [facility] by Dr [name]  . CHF (congestive heart failure)   . Chronic bronchitis   . Pneumonia     "high school; [employer]; twice since, last time 12/ 2014" (02/06/2013)  . H/O hiatal hernia   . Arthritis     "hands, back" (02/06/2013)  . Gout   . Kidney stones 1985   Past Surgical History  Procedure Laterality Date  . Total knee arthroplasty Bilateral     right Dec.2011,left June 2012  . Joint replacement    . Pilonidal cyst excision  1970's  . Kidney stone surgery  1985    "tried to go up my penis; had to cut my  stomach and go in for lodged stone"  . Av node ablation  12/08    at [facility]  . Insert / replace / remove pacemaker  12/28/2006; 04/11/2013    MDT CRTP implanted 2008 by Dr [name] at [facility]; gen change 03/2013 by Dr [name]  . Bunionectomy Bilateral 11/2012- 01/2013    "left-right; great toes; gout and bunion OR" (02/06/2013)  . Inguinal hernia repair Right   . Lithotripsy  1985  . Kidney stone surgery  1986    "put a tube in my back"  . Cataract extraction w/ intraocular lens  implant, bilateral Bilateral   . Permanent pacemaker generator change N/A 04/11/2013    Procedure: PERMANENT PACEMAKER GENERATOR CHANGE;  Surgeon: Henry Lance, MD;  Location: [facility] CATH LAB;  Service: Cardiovascular;  Laterality: N/A;    ROS- all systems are reviewed and negative except as per HPI above  Current Outpatient Prescriptions  Medication Sig Dispense Refill  . allopurinol (ZYLOPRIM) 300 MG tablet Take 300 mg by mouth daily.    . carvedilol (COREG) 6.25 MG tablet Take 1 tablet (6.25 mg total) by mouth 2 (two) times daily with a meal. 60 tablet 6  . doxazosin (CARDURA XL) 4 MG 24 hr tablet Take 4 mg by mouth daily with breakfast.    . famotidine (PEPCID) 20 MG tablet Take 40 mg by mouth  at bedtime.     . finasteride (PROSCAR) 5 MG tablet Take 5 mg by mouth every evening. 5pm    . furosemide (LASIX) 40 MG tablet TAKE ONE TABLET BY MOUTH ONCE DAILY 30 tablet 3  . Multiple Vitamin (MULTIVITAMIN WITH MINERALS) TABS tablet Take 1 tablet by mouth daily.    Marland Kitchen pyridoxine (B-6) 100 MG tablet Take 100 mg by mouth 2 (two) times daily.     Marland Kitchen warfarin (COUMADIN) 5 MG tablet Take 5 mg by mouth daily. As directed.     No current facility-administered medications for this visit.    Physical Exam: Filed Vitals:   04/09/14 1438  BP: 110/60  Pulse: 85  Height: 6\' 2"  (1.88 m)  Weight: 210 lb 6.4 oz (95.437 kg)    GEN- The patient is well appearing, alert and oriented x 3 today.   Head- normocephalic,  atraumatic Eyes-  Sclera clear, conjunctiva pink Ears- hearing intact Oropharynx- clear Lungs- Clear to ausculation bilaterally, normal work of breathing Chest- pacemaker pocket is well healed Heart- Regular rate and rhythm, no murmurs, rubs or gallops, PMI not laterally displaced GI- soft, NT, ND, + BS Extremities- no clubbing, cyanosis, or edema  Pacemaker interrogation- reviewed in detail today,  See PACEART report  Assessment and Plan:  1. Complete heart block RV lead impedance >3000.  I was able to demonstrate inhibition today with manipulation of the device pocket.  I have therefore changed RV sensitivity to 11 mV to prevent inhibition.  The LV lead is unipolar but functioning.  I have reprogrammed from LV tip to RV ring to LV tip to can configuration today.  I had the MDT rep [name] assist with evaluation and reprogramming today.  I have strongly recommended RV lead replacement.  I am concerned that he is device dependant and therefore is at risk of syncope or possibly death.   Risks, benefits, alternatives to pacemaker system revision were discussed in detail with the patient today. The patient understands that the risks include but are not limited to bleeding, infection, pneumothorax, perforation, tamponade, vascular damage, renal failure, MI, stroke, death, and lead dislodgement and wishes to proceed. We will therefore schedule the procedure at the next available time (next 1-2 days).  Hold coumadin in the interim.   2.  Permanent atrial fibrillation Hold Coumadin for pacemaker system revision CHADS2VASC  3  3.  Chronic systolic dysfunction Stable  Today, I have spent 40 minutes with the patient discussing pacemaker system revision .  More than 50% of the visit time today was spent on this issue. This is a very complex situation and a high level of decision making was required.  Henry Grayer MD, [facility] 04/09/2014 5:30 PM

## 2014-04-09 NOTE — Patient Instructions (Addendum)
Your physician has recommended you make the following change in your medication:   STOP Coumadin today  Please see instruction sheet for procedure on Wednesday.  Your physician recommends that you have lab work today CBC, BMET, and INR.  Your physician recommends that you schedule a follow-up appointment in:  7 to 10 days from 04/11/2014 for wound check in device clinic.

## 2014-04-10 DIAGNOSIS — K449 Diaphragmatic hernia without obstruction or gangrene: Secondary | ICD-10-CM | POA: Diagnosis not present

## 2014-04-10 DIAGNOSIS — T82110A Breakdown (mechanical) of cardiac electrode, initial encounter: Secondary | ICD-10-CM | POA: Diagnosis not present

## 2014-04-10 DIAGNOSIS — M109 Gout, unspecified: Secondary | ICD-10-CM | POA: Diagnosis not present

## 2014-04-10 DIAGNOSIS — M479 Spondylosis, unspecified: Secondary | ICD-10-CM | POA: Diagnosis not present

## 2014-04-10 DIAGNOSIS — I5022 Chronic systolic (congestive) heart failure: Secondary | ICD-10-CM | POA: Diagnosis not present

## 2014-04-10 DIAGNOSIS — Z7901 Long term (current) use of anticoagulants: Secondary | ICD-10-CM | POA: Diagnosis not present

## 2014-04-10 DIAGNOSIS — Y712 Prosthetic and other implants, materials and accessory cardiovascular devices associated with adverse incidents: Secondary | ICD-10-CM | POA: Diagnosis not present

## 2014-04-10 DIAGNOSIS — I482 Chronic atrial fibrillation: Secondary | ICD-10-CM | POA: Diagnosis not present

## 2014-04-10 DIAGNOSIS — D469 Myelodysplastic syndrome, unspecified: Secondary | ICD-10-CM | POA: Diagnosis not present

## 2014-04-10 DIAGNOSIS — I442 Atrioventricular block, complete: Secondary | ICD-10-CM | POA: Diagnosis not present

## 2014-04-10 DIAGNOSIS — K219 Gastro-esophageal reflux disease without esophagitis: Secondary | ICD-10-CM | POA: Diagnosis not present

## 2014-04-10 DIAGNOSIS — I429 Cardiomyopathy, unspecified: Secondary | ICD-10-CM | POA: Diagnosis not present

## 2014-04-10 DIAGNOSIS — M19041 Primary osteoarthritis, right hand: Secondary | ICD-10-CM | POA: Diagnosis not present

## 2014-04-10 DIAGNOSIS — M19042 Primary osteoarthritis, left hand: Secondary | ICD-10-CM | POA: Diagnosis not present

## 2014-04-10 DIAGNOSIS — Z79899 Other long term (current) drug therapy: Secondary | ICD-10-CM | POA: Diagnosis not present

## 2014-04-10 MED ORDER — CHLORHEXIDINE GLUCONATE 4 % EX LIQD
60.0000 mL | Freq: Once | CUTANEOUS | Status: DC
  Filled 2014-04-10: qty 60

## 2014-04-10 MED ORDER — CEFAZOLIN SODIUM-DEXTROSE 2-3 GM-% IV SOLR
2.0000 g | INTRAVENOUS | Status: DC
  Filled 2014-04-10 (×2): qty 50

## 2014-04-10 MED ORDER — MUPIROCIN 2 % EX OINT
1.0000 "application " | TOPICAL_OINTMENT | Freq: Once | CUTANEOUS | Status: AC
  Administered 2014-04-11: 1 via TOPICAL
  Filled 2014-04-10: qty 22

## 2014-04-10 MED ORDER — SODIUM CHLORIDE 0.9 % IR SOLN
80.0000 mg | Status: DC
  Filled 2014-04-10: qty 2

## 2014-04-10 MED ORDER — SODIUM CHLORIDE 0.9 % IV SOLN
INTRAVENOUS | Status: DC
  Administered 2014-04-11: 14:00:00 via INTRAVENOUS

## 2014-04-11 ENCOUNTER — Ambulatory Visit (HOSPITAL_COMMUNITY)
Admission: RE | Admit: 2014-04-11 | Discharge: 2014-04-12 | Disposition: A | Discharge status: Home / Self Care | Payer: Medicare Other | Source: Ambulatory Visit | Attending: Internal Medicine | Admitting: Internal Medicine

## 2014-04-11 ENCOUNTER — Encounter (HOSPITAL_COMMUNITY)
Admission: RE | Disposition: A | Discharge status: Home / Self Care | Payer: Self-pay | Source: Ambulatory Visit | Attending: Internal Medicine

## 2014-04-11 ENCOUNTER — Encounter (HOSPITAL_COMMUNITY): Payer: Self-pay | Admitting: General Practice

## 2014-04-11 DIAGNOSIS — M19042 Primary osteoarthritis, left hand: Secondary | ICD-10-CM | POA: Insufficient documentation

## 2014-04-11 DIAGNOSIS — Z7901 Long term (current) use of anticoagulants: Secondary | ICD-10-CM | POA: Insufficient documentation

## 2014-04-11 DIAGNOSIS — D469 Myelodysplastic syndrome, unspecified: Secondary | ICD-10-CM | POA: Insufficient documentation

## 2014-04-11 DIAGNOSIS — I482 Chronic atrial fibrillation: Secondary | ICD-10-CM | POA: Diagnosis not present

## 2014-04-11 DIAGNOSIS — I4821 Permanent atrial fibrillation: Secondary | ICD-10-CM | POA: Diagnosis present

## 2014-04-11 DIAGNOSIS — Y712 Prosthetic and other implants, materials and accessory cardiovascular devices associated with adverse incidents: Secondary | ICD-10-CM | POA: Insufficient documentation

## 2014-04-11 DIAGNOSIS — M109 Gout, unspecified: Secondary | ICD-10-CM | POA: Insufficient documentation

## 2014-04-11 DIAGNOSIS — I5022 Chronic systolic (congestive) heart failure: Secondary | ICD-10-CM | POA: Insufficient documentation

## 2014-04-11 DIAGNOSIS — K219 Gastro-esophageal reflux disease without esophagitis: Secondary | ICD-10-CM | POA: Insufficient documentation

## 2014-04-11 DIAGNOSIS — Z79899 Other long term (current) drug therapy: Secondary | ICD-10-CM | POA: Insufficient documentation

## 2014-04-11 DIAGNOSIS — I442 Atrioventricular block, complete: Secondary | ICD-10-CM

## 2014-04-11 DIAGNOSIS — I429 Cardiomyopathy, unspecified: Secondary | ICD-10-CM | POA: Insufficient documentation

## 2014-04-11 DIAGNOSIS — Z959 Presence of cardiac and vascular implant and graft, unspecified: Secondary | ICD-10-CM

## 2014-04-11 DIAGNOSIS — M479 Spondylosis, unspecified: Secondary | ICD-10-CM | POA: Insufficient documentation

## 2014-04-11 DIAGNOSIS — M19041 Primary osteoarthritis, right hand: Secondary | ICD-10-CM | POA: Insufficient documentation

## 2014-04-11 DIAGNOSIS — T82110A Breakdown (mechanical) of cardiac electrode, initial encounter: Secondary | ICD-10-CM | POA: Insufficient documentation

## 2014-04-11 DIAGNOSIS — Z950 Presence of cardiac pacemaker: Secondary | ICD-10-CM | POA: Diagnosis present

## 2014-04-11 DIAGNOSIS — K449 Diaphragmatic hernia without obstruction or gangrene: Secondary | ICD-10-CM | POA: Insufficient documentation

## 2014-04-11 HISTORY — DX: Presence of cardiac pacemaker: Z95.0

## 2014-04-11 HISTORY — DX: Chronic kidney disease, unspecified: N18.9

## 2014-04-11 HISTORY — PX: LEAD REVISION: SHX5945

## 2014-04-11 LAB — SURGICAL PCR SCREEN
MRSA, PCR: NEGATIVE
STAPHYLOCOCCUS AUREUS: NEGATIVE

## 2014-04-11 LAB — PROTIME-INR
INR: 1.42 (ref 0.00–1.49)
Prothrombin Time: 17.5 seconds — ABNORMAL HIGH (ref 11.6–15.2)

## 2014-04-11 SURGERY — LEAD REVISION

## 2014-04-11 MED ORDER — DOXAZOSIN MESYLATE ER 4 MG PO TB24: 4.0000 mg | ORAL_TABLET | Freq: Every day | ORAL | Status: DC

## 2014-04-11 MED ORDER — MIDAZOLAM HCL 5 MG/5ML IJ SOLN
INTRAMUSCULAR | Status: AC
  Filled 2014-04-11: qty 5

## 2014-04-11 MED ORDER — HYDROCODONE-ACETAMINOPHEN 5-325 MG PO TABS: 1.0000 | ORAL_TABLET | ORAL | Status: DC | PRN

## 2014-04-11 MED ORDER — VITAMIN B-6 100 MG PO TABS
100.0000 mg | ORAL_TABLET | Freq: Two times a day (BID) | ORAL | Status: DC
  Administered 2014-04-11 – 2014-04-12 (×2): 100 mg via ORAL
  Filled 2014-04-11 (×3): qty 1

## 2014-04-11 MED ORDER — MUPIROCIN 2 % EX OINT
TOPICAL_OINTMENT | CUTANEOUS | Status: AC
  Filled 2014-04-11: qty 22

## 2014-04-11 MED ORDER — SODIUM CHLORIDE 0.9 % IJ SOLN: 3.0000 mL | INTRAMUSCULAR | Status: DC | PRN

## 2014-04-11 MED ORDER — LIDOCAINE HCL (PF) 1 % IJ SOLN
INTRAMUSCULAR | Status: AC
  Filled 2014-04-11: qty 30

## 2014-04-11 MED ORDER — ONDANSETRON HCL 4 MG/2ML IJ SOLN: 4.0000 mg | Freq: Four times a day (QID) | INTRAMUSCULAR | Status: DC | PRN

## 2014-04-11 MED ORDER — HEPARIN (PORCINE) IN NACL 2-0.9 UNIT/ML-% IJ SOLN
INTRAMUSCULAR | Status: AC
  Filled 2014-04-11: qty 500

## 2014-04-11 MED ORDER — SODIUM CHLORIDE 0.9 % IJ SOLN
3.0000 mL | Freq: Two times a day (BID) | INTRAMUSCULAR | Status: DC
  Administered 2014-04-11: 3 mL via INTRAVENOUS

## 2014-04-11 MED ORDER — CARVEDILOL 6.25 MG PO TABS
6.2500 mg | ORAL_TABLET | Freq: Two times a day (BID) | ORAL | Status: DC
  Administered 2014-04-11 – 2014-04-12 (×2): 6.25 mg via ORAL
  Filled 2014-04-11 (×2): qty 1

## 2014-04-11 MED ORDER — FAMOTIDINE 20 MG PO TABS
40.0000 mg | ORAL_TABLET | Freq: Two times a day (BID) | ORAL | Status: DC
  Administered 2014-04-11 – 2014-04-12 (×2): 40 mg via ORAL
  Filled 2014-04-11 (×2): qty 2

## 2014-04-11 MED ORDER — DOXAZOSIN MESYLATE 4 MG PO TABS
4.0000 mg | ORAL_TABLET | Freq: Every day | ORAL | Status: DC
  Administered 2014-04-11 – 2014-04-12 (×2): 4 mg via ORAL
  Filled 2014-04-11 (×2): qty 1

## 2014-04-11 MED ORDER — FINASTERIDE 5 MG PO TABS
5.0000 mg | ORAL_TABLET | Freq: Every day | ORAL | Status: DC
  Administered 2014-04-11: 5 mg via ORAL
  Filled 2014-04-11: qty 1

## 2014-04-11 MED ORDER — SODIUM CHLORIDE 0.9 % IV SOLN: 250.0000 mL | INTRAVENOUS | Status: DC | PRN

## 2014-04-11 MED ORDER — FENTANYL CITRATE 0.05 MG/ML IJ SOLN
INTRAMUSCULAR | Status: AC
  Filled 2014-04-11: qty 2

## 2014-04-11 MED ORDER — ALLOPURINOL 300 MG PO TABS
300.0000 mg | ORAL_TABLET | Freq: Every day | ORAL | Status: DC
  Administered 2014-04-11 – 2014-04-12 (×2): 300 mg via ORAL
  Filled 2014-04-11 (×2): qty 1

## 2014-04-11 MED ORDER — FUROSEMIDE 40 MG PO TABS
40.0000 mg | ORAL_TABLET | Freq: Every day | ORAL | Status: DC
  Administered 2014-04-11 – 2014-04-12 (×2): 40 mg via ORAL
  Filled 2014-04-11 (×2): qty 1

## 2014-04-11 MED ORDER — ACETAMINOPHEN 325 MG PO TABS
325.0000 mg | ORAL_TABLET | ORAL | Status: DC | PRN
Start: 2014-04-11 — End: 2014-04-12

## 2014-04-11 MED ORDER — CEFAZOLIN SODIUM 1-5 GM-% IV SOLN
1.0000 g | Freq: Four times a day (QID) | INTRAVENOUS | Status: AC
  Administered 2014-04-11 – 2014-04-12 (×3): 1 g via INTRAVENOUS
  Filled 2014-04-11 (×3): qty 50

## 2014-04-11 NOTE — Interval H&P Note (Signed)
History and Physical Interval Note:  04/11/2014 1:32 PM  [name]  has presented today for surgery, with the diagnosis of lead malfunction  The various methods of treatment have been discussed with the patient and family. After consideration of risks, benefits and other options for treatment, the patient has consented to  Procedure(s): LEAD REVISION (N/A) as a surgical intervention .  The patient's history has been reviewed, patient examined, no change in status, stable for surgery.  I have reviewed the patient's chart and labs.  Questions were answered to the patient's satisfaction.     [name]

## 2014-04-11 NOTE — Op Note (Signed)
SURGEON:  [name], MD     PREPROCEDURE DIAGNOSES:   1. Complete heart block   2. Right ventricular pacing lead fracture.   3. Permanent atrial fibrillation    POSTPROCEDURE DIAGNOSES:   1. Complete heart block   2. Right ventricular pacing lead fracture.   3. Permanent atrial fibrillation    PROCEDURES:   1. Left upper extremity venography.   2. RV lead placement     INTRODUCTION:  [name] is a 81 y.o. male with a history of complete heart block s/p biventricular pacemaker implantation in 2008. The patient has done well since his pacemaker was implanted.  He underwent generator change 04/11/2013.  He did well until recent remote transmission revealed RV lead impedance of >3000 Ohms.  He was evaluated in the office and found to have an abrupt increase in right ventricular lead threshold as well as inhibition of pacing with pocket manipulation.  The patient presents today for pacemaker system revision.       DESCRIPTION OF THE PROCEDURE:  Informed written consent was obtained, and the patient was brought to the electrophysiology lab in the fasting state.  The patient's pacemaker was interrogated today and RV lead abnormality was again observed.  The patient was adequately sedated with intravenous Versed as outlined in the nursing report. The patient's left chest was prepped and draped in the usual sterile fashion by the EP lab staff.  The skin overlying the existing pacemaker was infiltrated with lidocaine for local analgesia.  A 4-cm incision was made over the pacemaker pocket.  Using a combination of sharp and blunt dissection, the pacemaker was exposed and removed from the body.  The device was disconnected from the leads. There was no foreign matter or debris within the pocket.    A venogram of the left upper extremity was performed which revealed a moderate sized left axillary vein which emptied into a large left subclavian vein.  There was no stenosis of the vein.  The left  axillary vein was therefore cannulated with fluoroscopic visualization.  Through the left axillary vein a Medtronic model 5076 ([id number]) lead was advanced into the right ventricular apical septum.  In this location R waves measured 6.5 mv (paced) with an impedance of 793 Ohms and a threshold of 0.6V @0 .4 msec.  The lead was secured to the pectoralis fascia with 2 Silk over the suture sleeve.    The previously implanted RV lead was a MDT 4270 ([id number]) lead implanted 12/28/2006.  This lead was capped and returned to the pocket.  The left ventricular lead was confirmed to be a Medtronic model 4193 (unipolar lead) (serial number [id number]) lead implanted on 12/28/2006.  This lead was examined and its integrity was confirmed to be intact. There was no obvious defect to the prior RV lead either with direct visualization or fluoroscopically. Left ventricular lead impedance was 551 ohms with a threshold of 1.75 V at 0.8 msec (LV tip to can).  Both leads were connected to the previously implanted Medtronic Consulta pacemaker (implanted initially 04/11/2013).  Electrocautery was required to assure hemostasis.  The pocket was irrigated with copious gentamicin solution. The pacemaker was then placed into the pocket.  The pocket was then closed in 2 layers with 2-0 Vicryl suture over the subcutaneous and subcuticular layers.  Steri-Strips and a sterile dressing were then applied.EBL<5ml. There were no early apparent complications.     CONCLUSIONS:   1. Successful RV lead replacement for lead fracture  2. No  early apparent complications.     [name], MD 04/11/2014 5:40 PM

## 2014-04-11 NOTE — H&P (View-Only) (Signed)
PCP: Pcp Not In System Primary Cardiologist: Anshul Meddings is a 81 y.o. male who presents today for routine electrophysiology followup.  Since recent generator change, the patient reports doing very well.  He remains active.  He has noticed occasional postural dizziness.  He denies presyncope or syncope.  Recent remote device transmission has revealed that his RV pacing lead has fractured.  He is therefore brought to the device clinic today as an add on for further discussion/ evaluation. Today, he denies symptoms of palpitations, chest pain, shortness of breath,  lower extremity edema, dizziness, presyncope, or syncope.  The patient is otherwise without complaint today.   Past Medical History  Diagnosis Date  . Bursitis     OF THE RIGHT SHOULDER  . Tinnitus   . Permanent atrial fibrillation     s/p AV nodal ablation  . Depressed ejection fraction 10    EF previously 45-50%, s/p AV nodal ablation and biv pacemaker implant, previously enrolled in Block HF and was randomized to RV pacing only)  . Chronic anemia     RECIEVES ARANESP SHOTS IF HIS HEMOGLOBIN FALLS BELOW 11  . Blood transfusion     "several; Dr. Deberah Pelton" (02/06/2013)  . GERD (gastroesophageal reflux disease)   . MDS (myelodysplastic syndrome), low grade 02/10/2011  . Iron overload, transfusional 02/10/2011  . Complete heart block 12/28/06    s/p AV nodal ablation and BIV pacemaker implant at [facility] by Dr Rosita Fire  . CHF (congestive heart failure)   . Chronic bronchitis   . Pneumonia     "high school; [employer]; twice since, last time 12/ 2014" (02/06/2013)  . H/O hiatal hernia   . Arthritis     "hands, back" (02/06/2013)  . Gout   . Kidney stones 1985   Past Surgical History  Procedure Laterality Date  . Total knee arthroplasty Bilateral     right Dec.2011,left June 2012  . Joint replacement    . Pilonidal cyst excision  1970's  . Kidney stone surgery  1985    "tried to go up my penis; had to cut my  stomach and go in for lodged stone"  . Av node ablation  12/08    at [facility]  . Insert / replace / remove pacemaker  12/28/2006; 04/11/2013    MDT CRTP implanted 2008 by Dr Rosita Fire at [facility]; gen change 03/2013 by Dr Lovena Le  . Bunionectomy Bilateral 11/2012- 01/2013    "left-right; great toes; gout and bunion OR" (02/06/2013)  . Inguinal hernia repair Right   . Lithotripsy  1985  . Kidney stone surgery  1986    "put a tube in my back"  . Cataract extraction w/ intraocular lens  implant, bilateral Bilateral   . Permanent pacemaker generator change N/A 04/11/2013    Procedure: PERMANENT PACEMAKER GENERATOR CHANGE;  Surgeon: Evans Lance, MD;  Location: [facility] CATH LAB;  Service: Cardiovascular;  Laterality: N/A;    ROS- all systems are reviewed and negative except as per HPI above  Current Outpatient Prescriptions  Medication Sig Dispense Refill  . allopurinol (ZYLOPRIM) 300 MG tablet Take 300 mg by mouth daily.    . carvedilol (COREG) 6.25 MG tablet Take 1 tablet (6.25 mg total) by mouth 2 (two) times daily with a meal. 60 tablet 6  . doxazosin (CARDURA XL) 4 MG 24 hr tablet Take 4 mg by mouth daily with breakfast.    . famotidine (PEPCID) 20 MG tablet Take 40 mg by mouth  at bedtime.     . finasteride (PROSCAR) 5 MG tablet Take 5 mg by mouth every evening. 5pm    . furosemide (LASIX) 40 MG tablet TAKE ONE TABLET BY MOUTH ONCE DAILY 30 tablet 3  . Multiple Vitamin (MULTIVITAMIN WITH MINERALS) TABS tablet Take 1 tablet by mouth daily.    Marland Kitchen pyridoxine (B-6) 100 MG tablet Take 100 mg by mouth 2 (two) times daily.     Marland Kitchen warfarin (COUMADIN) 5 MG tablet Take 5 mg by mouth daily. As directed.     No current facility-administered medications for this visit.    Physical Exam: Filed Vitals:   04/09/14 1438  BP: 110/60  Pulse: 85  Height: 6\' 2"  (1.88 m)  Weight: 210 lb 6.4 oz (95.437 kg)    GEN- The patient is well appearing, alert and oriented x 3 today.   Head- normocephalic,  atraumatic Eyes-  Sclera clear, conjunctiva pink Ears- hearing intact Oropharynx- clear Lungs- Clear to ausculation bilaterally, normal work of breathing Chest- pacemaker pocket is well healed Heart- Regular rate and rhythm, no murmurs, rubs or gallops, PMI not laterally displaced GI- soft, NT, ND, + BS Extremities- no clubbing, cyanosis, or edema  Pacemaker interrogation- reviewed in detail today,  See PACEART report  Assessment and Plan:  1. Complete heart block RV lead impedance >3000.  I was able to demonstrate inhibition today with manipulation of the device pocket.  I have therefore changed RV sensitivity to 11 mV to prevent inhibition.  The LV lead is unipolar but functioning.  I have reprogrammed from LV tip to RV ring to LV tip to can configuration today.  I had the MDT rep Dannial Monarch assist with evaluation and reprogramming today.  I have strongly recommended RV lead replacement.  I am concerned that he is device dependant and therefore is at risk of syncope or possibly death.   Risks, benefits, alternatives to pacemaker system revision were discussed in detail with the patient today. The patient understands that the risks include but are not limited to bleeding, infection, pneumothorax, perforation, tamponade, vascular damage, renal failure, MI, stroke, death, and lead dislodgement and wishes to proceed. We will therefore schedule the procedure at the next available time (next 1-2 days).  Hold coumadin in the interim.   2.  Permanent atrial fibrillation Hold Coumadin for pacemaker system revision CHADS2VASC  3  3.  Chronic systolic dysfunction Stable  Today, I have spent 40 minutes with the patient discussing pacemaker system revision .  More than 50% of the visit time today was spent on this issue. This is a very complex situation and a high level of decision making was required.  [name] MD, [facility] 04/09/2014 5:30 PM

## 2014-04-12 ENCOUNTER — Ambulatory Visit (HOSPITAL_COMMUNITY): Payer: Medicare Other

## 2014-04-12 DIAGNOSIS — T82110A Breakdown (mechanical) of cardiac electrode, initial encounter: Secondary | ICD-10-CM | POA: Diagnosis not present

## 2014-04-12 DIAGNOSIS — Z950 Presence of cardiac pacemaker: Secondary | ICD-10-CM | POA: Diagnosis not present

## 2014-04-12 DIAGNOSIS — I5022 Chronic systolic (congestive) heart failure: Secondary | ICD-10-CM | POA: Diagnosis not present

## 2014-04-12 DIAGNOSIS — I442 Atrioventricular block, complete: Secondary | ICD-10-CM | POA: Diagnosis not present

## 2014-04-12 DIAGNOSIS — I482 Chronic atrial fibrillation: Secondary | ICD-10-CM | POA: Diagnosis not present

## 2014-04-12 NOTE — Progress Notes (Signed)
Pt is ready for DC home accompanied by wife. Pt reports he understands all DC medications, follow up appointments, and site care instructions.   Prescilla Sours, Therapist, sports

## 2014-04-12 NOTE — Discharge Summary (Signed)
ELECTROPHYSIOLOGY PROCEDURE DISCHARGE SUMMARY    Patient ID: [name],  MRN: [id number], DOB/AGE: 1927-03-06 81 y.o.  Admit date: 04/11/2014 Discharge date: 04/12/2014  Primary Care Physician: Penni Homans, MD Primary Cardiologist: Mare Ferrari Electrophysiologist: Erika Hussar  Primary Discharge Diagnosis:  RV lead fracture s/p insertion of new RV lead this admission  Secondary Discharge Diagnosis:  1.  Permanent atrial fibrillation 2.  Chronic systolic heart failure 3.  Complete heart block 4.  GERD   Allergies  Allergen Reactions  . Methylprednisolone Other (See Comments)    Dizziness, passed out twice  . Moxifloxacin Hcl In Nacl Other (See Comments) and Itching    Pt c/o being "out of his head" & itching  . Ramipril Other (See Comments)    Renal function worsened on ACEi.     Procedures This Admission:  1.  Implantation of a MDT model number 5076 RV lead on 04-11-14 by Dr Rayann Heman. The patient's previously implanted LV lead was used and connected with the new RV lead to the previously implanted MDT CRTP.  The previously implanted fractured RV lead was capped. There were no immediate post procedure complications. 2.  CXR on 04-12-14 demonstrated no pneumothorax status post device implantation.   Brief HPI: [name] is a 81 y.o. male with a past medical history of permanent atrial fibrillation s/p AVN ablation, resultant CHB, and cardiomyopathy with chronic systolic heart failure.  He was recently found to have RV lead impedence of >3000 ohms on recent remote interrogation.  With PPM dependence, RV lead revision was recommended. Risks, benefits, and alternatives to lead revision were reviewed with the patient who wished to proceed.   Hospital Course:  The patient was admitted and underwent implantation of a new RV lead with details as outlined above.  He  was monitored on telemetry overnight which demonstrated atrial fibrillation with ventricular pacing.  Left chest  was without hematoma or ecchymosis.  The device was interrogated and found to be functioning normally.  CXR was obtained and demonstrated no pneumothorax status post device implantation.  Wound care, arm mobility, and restrictions were reviewed with the patient.  Tthe patient was examined and considered stable for discharge to home.    Physical Exam: Filed Vitals:   04/11/14 2052 04/11/14 2152 04/12/14 0443 04/12/14 1000  BP: 111/56 98/52 95/56  112/62  Pulse: 70 70 70   Temp:   97.8 F (36.6 C)   TempSrc:   Oral   Resp: 15 14 17    Height:      Weight:      SpO2: 97% 94% 96%     Labs:   Lab Results  Component Value Date   WBC 7.8 04/09/2014   HGB 11.2* 04/09/2014   HCT 33.9* 04/09/2014   MCV 93.3 04/09/2014   PLT 127.0* 04/09/2014     Recent Labs Lab 04/09/14 1531  NA 139  K 4.0  CL 105  CO2 30  BUN 25*  CREATININE 1.73*  CALCIUM 9.2  GLUCOSE 81    Discharge Medications:    Medication List    TAKE these medications        allopurinol 300 MG tablet  Commonly known as:  ZYLOPRIM  Take 300 mg by mouth daily.     carvedilol 6.25 MG tablet  Commonly known as:  COREG  Take 1 tablet (6.25 mg total) by mouth 2 (two) times daily with a meal.     doxazosin 4 MG 24 hr tablet  Commonly known as:  CARDURA XL  Take 4 mg by mouth daily with breakfast.     famotidine 20 MG tablet  Commonly known as:  PEPCID  Take 40 mg by mouth 2 (two) times daily.     finasteride 5 MG tablet  Commonly known as:  PROSCAR  Take 5 mg by mouth every evening. 5pm     furosemide 40 MG tablet  Commonly known as:  LASIX  TAKE ONE TABLET BY MOUTH ONCE DAILY     multivitamin with minerals Tabs tablet  Take 1 tablet by mouth daily.     pyridoxine 100 MG tablet  Commonly known as:  B-6  Take 100 mg by mouth 2 (two) times daily.     warfarin 5 MG tablet  Commonly known as:  COUMADIN  Take 5 mg by mouth daily.        Disposition:  Discharge Instructions    Diet - low sodium  heart healthy    Complete by:  As directed      Increase activity slowly    Complete by:  As directed           Follow-up Information    Follow up with [facility] On 04/18/2014.   Why:  at 9:30AM for wound check   Contact information:   [place] [phone]       Duration of Discharge Encounter: Greater than 30 minutes including physician time.  Signed, Chanetta Marshall, NP 04/12/2014 12:02 PM   [name] MD

## 2014-04-12 NOTE — Progress Notes (Signed)
Doing very well s/p lead revision No concerns Device pocket looks good No ptx on cxr, stable leads  Device interrogation is reviewed and normal  DC to home Routine wound care and follow-up  [name] MD, [facility] 04/12/2014 9:08 AM

## 2014-04-12 NOTE — Discharge Instructions (Signed)
° ° °  Supplemental Discharge Instructions for  °Pacemaker/Defibrillator Patients ° °Activity °No heavy lifting or vigorous activity with your left/right arm for 6 to 8 weeks.  Do not raise your left/right arm above your head for one week.  Gradually raise your affected arm as drawn below. ° °        ° °__    04-17-14                  04-18-14                      04-19-14                  04-20-14 ° °NO DRIVING for  1 week   ; you may begin driving on  04-20-14   . ° °WOUND CARE °- Keep the wound area clean and dry.  Do not get this area wet for one week. No showers for one week; you may shower on 04-20-14    . °- The tape/steri-strips on your wound will fall off; do not pull them off.  No bandage is needed on the site.  DO  NOT apply any creams, oils, or ointments to the wound area. °- If you notice any drainage or discharge from the wound, any swelling or bruising at the site, or you develop a fever > 101? F after you are discharged home, call the office at once. ° °Special Instructions °- You are still able to use cellular telephones; use the ear opposite the side where you have your pacemaker/defibrillator.  Avoid carrying your cellular phone near your device. °- When traveling through airports, show security personnel your identification card to avoid being screened in the metal detectors.  Ask the security personnel to use the hand wand. °- Avoid arc welding equipment, MRI testing (magnetic resonance imaging), TENS units (transcutaneous nerve stimulators).  Call the office for questions about other devices. °- Avoid electrical appliances that are in poor condition or are not properly grounded. °- Microwave ovens are safe to be near or to operate. ° °

## 2014-04-16 ENCOUNTER — Other Ambulatory Visit: Payer: Self-pay | Admitting: Cardiology

## 2014-04-16 NOTE — Telephone Encounter (Signed)
[name], MD at 04/09/2014 5:25 PM  furosemide (LASIX) 40 MG tabletTAKE ONE TABLET BY MOUTH ONCE DAILY Patient Instructions     Your physician has recommended you make the following change in your medication:   STOP Coumadin today  Please see instruction sheet for procedure on Wednesday.  Your physician recommends that you have lab work today CBC, BMET, and INR.  Your physician recommends that you schedule a follow-up appointment in: 7 to 10 days from 04/11/2014 for wound check in device clinic.

## 2014-04-18 ENCOUNTER — Ambulatory Visit (INDEPENDENT_AMBULATORY_CARE_PROVIDER_SITE_OTHER): Payer: Medicare Other | Admitting: *Deleted

## 2014-04-18 DIAGNOSIS — Z950 Presence of cardiac pacemaker: Secondary | ICD-10-CM

## 2014-04-18 DIAGNOSIS — I5022 Chronic systolic (congestive) heart failure: Secondary | ICD-10-CM

## 2014-04-18 DIAGNOSIS — I442 Atrioventricular block, complete: Secondary | ICD-10-CM

## 2014-04-18 LAB — MDC_IDC_ENUM_SESS_TYPE_INCLINIC
Battery Remaining Longevity: 53 mo
Brady Statistic AP VP Percent: 0 %
Brady Statistic AP VS Percent: 0 %
Brady Statistic AS VS Percent: 2.03 %
Brady Statistic RA Percent Paced: 0 %
Date Time Interrogation Session: 20160330140133
Lead Channel Impedance Value: 4047 Ohm
Lead Channel Impedance Value: 4047 Ohm
Lead Channel Impedance Value: 4047 Ohm
Lead Channel Impedance Value: 4047 Ohm
Lead Channel Impedance Value: 4047 Ohm
Lead Channel Impedance Value: 418 Ohm
Lead Channel Impedance Value: 494 Ohm
Lead Channel Impedance Value: 703 Ohm
Lead Channel Pacing Threshold Amplitude: 1.25 V
Lead Channel Pacing Threshold Pulse Width: 0.8 ms
Lead Channel Sensing Intrinsic Amplitude: 15.625 mV
Lead Channel Sensing Intrinsic Amplitude: 15.625 mV
Lead Channel Sensing Intrinsic Amplitude: 31.625 mV
Lead Channel Setting Pacing Amplitude: 2.5 V
Lead Channel Setting Pacing Amplitude: 3.5 V
Lead Channel Setting Pacing Pulse Width: 0.8 ms
Lead Channel Setting Sensing Sensitivity: 5.6 mV
MDC IDC MSMT BATTERY VOLTAGE: 3 V
MDC IDC MSMT LEADCHNL LV IMPEDANCE VALUE: 570 Ohm
MDC IDC MSMT LEADCHNL RA SENSING INTR AMPL: 31.625 mV
MDC IDC MSMT LEADCHNL RV PACING THRESHOLD AMPLITUDE: 0.625 V
MDC IDC MSMT LEADCHNL RV PACING THRESHOLD PULSEWIDTH: 0.4 ms
MDC IDC SET LEADCHNL RV PACING PULSEWIDTH: 0.4 ms
MDC IDC SET ZONE DETECTION INTERVAL: 400 ms
MDC IDC STAT BRADY AS VP PERCENT: 97.97 %
MDC IDC STAT BRADY RV PERCENT PACED: 97.97 %
Zone Setting Detection Interval: 350 ms

## 2014-04-18 NOTE — Progress Notes (Signed)
CRT-P lead revision & changeout wound check. Steri-strips removed. Wound well healed. Thresholds & impedances consistent with implant. RV threshold set to 3.5V extra safety margin until 66mo visit. LV lead programmed to chronic setting. All other diagnostics normal. No programming changes made. ROV w/ Dr. Rayann Heman in 57mo.

## 2014-05-01 ENCOUNTER — Encounter: Payer: Self-pay | Admitting: Internal Medicine

## 2014-05-07 ENCOUNTER — Other Ambulatory Visit: Payer: Self-pay | Admitting: Hematology & Oncology

## 2014-05-07 ENCOUNTER — Encounter: Payer: Self-pay | Admitting: Hematology & Oncology

## 2014-05-07 ENCOUNTER — Ambulatory Visit (HOSPITAL_BASED_OUTPATIENT_CLINIC_OR_DEPARTMENT_OTHER): Payer: Medicare Other | Admitting: Hematology & Oncology

## 2014-05-07 ENCOUNTER — Other Ambulatory Visit (HOSPITAL_BASED_OUTPATIENT_CLINIC_OR_DEPARTMENT_OTHER): Payer: Medicare Other

## 2014-05-07 VITALS — BP 124/68 | HR 86 | Temp 98.0°F | Resp 16 | Wt 212.0 lb

## 2014-05-07 DIAGNOSIS — D464 Refractory anemia, unspecified: Secondary | ICD-10-CM

## 2014-05-07 DIAGNOSIS — I4891 Unspecified atrial fibrillation: Secondary | ICD-10-CM | POA: Diagnosis not present

## 2014-05-07 DIAGNOSIS — D4620 Refractory anemia with excess of blasts, unspecified: Secondary | ICD-10-CM

## 2014-05-07 LAB — MANUAL DIFFERENTIAL (CHCC SATELLITE)
ALC: 2.8 10*3/uL (ref 0.9–3.3)
ANC (CHCC HP manual diff): 2.1 10*3/uL (ref 1.5–6.5)
BASO: 3 % — AB (ref 0–2)
Eos: 1 % (ref 0–7)
LYMPH: 49 % — AB (ref 14–48)
MONO: 10 % (ref 0–13)
Metamyelocytes: 3 % — ABNORMAL HIGH (ref 0–0)
Myelocytes: 2 % — ABNORMAL HIGH (ref 0–0)
PLT EST ~~LOC~~: DECREASED
SEG: 32 % — ABNORMAL LOW (ref 40–75)

## 2014-05-07 LAB — PROTIME-INR (CHCC SATELLITE)
INR: 2.9 (ref 2.0–3.5)
PROTIME: 34.8 s — AB (ref 10.6–13.4)

## 2014-05-07 LAB — FERRITIN CHCC

## 2014-05-07 LAB — CMP (CANCER CENTER ONLY)
ALBUMIN: 3.9 g/dL (ref 3.3–5.5)
ALT(SGPT): 19 U/L (ref 10–47)
AST: 27 U/L (ref 11–38)
Alkaline Phosphatase: 76 U/L (ref 26–84)
BILIRUBIN TOTAL: 1.4 mg/dL (ref 0.20–1.60)
BUN, Bld: 21 mg/dL (ref 7–22)
CO2: 29 mEq/L (ref 18–33)
CREATININE: 1.7 mg/dL — AB (ref 0.6–1.2)
Calcium: 9.4 mg/dL (ref 8.0–10.3)
Chloride: 106 mEq/L (ref 98–108)
GLUCOSE: 86 mg/dL (ref 73–118)
Potassium: 4 mEq/L (ref 3.3–4.7)
Sodium: 146 mEq/L — ABNORMAL HIGH (ref 128–145)
TOTAL PROTEIN: 7.4 g/dL (ref 6.4–8.1)

## 2014-05-07 LAB — CBC WITH DIFFERENTIAL (CANCER CENTER ONLY)
HEMATOCRIT: 33.6 % — AB (ref 38.7–49.9)
HGB: 10.9 g/dL — ABNORMAL LOW (ref 13.0–17.1)
MCH: 31 pg (ref 28.0–33.4)
MCHC: 32.4 g/dL (ref 32.0–35.9)
MCV: 96 fL (ref 82–98)
Platelets: 108 10*3/uL — ABNORMAL LOW (ref 145–400)
RBC: 3.52 10*6/uL — AB (ref 4.20–5.70)
RDW: 14.5 % (ref 11.1–15.7)
WBC: 5.7 10*3/uL (ref 4.0–10.0)

## 2014-05-07 LAB — IRON AND TIBC CHCC
%SAT: 59 % — ABNORMAL HIGH (ref 20–55)
Iron: 109 ug/dL (ref 42–163)
TIBC: 184 ug/dL — AB (ref 202–409)
UIBC: 75 ug/dL — ABNORMAL LOW (ref 117–376)

## 2014-05-07 NOTE — Progress Notes (Signed)
Hematology and Oncology Follow Up Visit  [name] [id number] 10/13/1927 81 y.o. 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  Marks is back for followup. He is feeling better. He apparently, had a problem with the pacemaker that he had in. He had 2 leads placement only one apparently was working. This had a be replaced.  Otherwise, he feels pretty well. He's looking for to the warm weather this week.  He is getting his garden ready. This probably will be the last year that he has one. His become more of a problem for him.  He's had no bleeding. He's on Coumadin. His INR today is 2.9. I'll be his dose needs to change.  He's had no change in bowel or bladder habits. He's had no leg swelling. He's had no rashes. He's had no cough or shortness of breath.  Overall, his performance status is ECOG 1 Medications:  Current outpatient prescriptions:  .  allopurinol (ZYLOPRIM) 300 MG tablet, Take 300 mg by mouth daily., Disp: , Rfl:  .  carvedilol (COREG) 6.25 MG tablet, Take 1 tablet (6.25 mg total) by mouth 2 (two) times daily with a meal., Disp: 60 tablet, Rfl: 6 .  doxazosin (CARDURA XL) 4 MG 24 hr tablet, Take 4 mg by mouth daily with breakfast., Disp: , Rfl:  .  famotidine (PEPCID) 20 MG tablet, Take 40 mg by mouth 2 (two) times daily. , Disp: , Rfl:  .  finasteride (PROSCAR) 5 MG tablet, Take 5 mg by mouth every evening. 5pm, Disp: , Rfl:  .  furosemide (LASIX) 40 MG tablet, TAKE ONE TABLET BY MOUTH ONCE DAILY., Disp: 90 tablet, Rfl: 3 .  Multiple Vitamin (MULTIVITAMIN WITH MINERALS) TABS tablet, Take 1 tablet by mouth daily., Disp: , Rfl:  .  pyridoxine (B-6) 100 MG tablet, Take 100 mg by mouth 2 (two) times daily. , Disp: , Rfl:  .  warfarin (COUMADIN) 5 MG tablet, Take 5 mg by mouth daily. ,  Disp: , Rfl:  .  warfarin (COUMADIN) 5 MG tablet, TAKE ONE TABLET BY MOUTH ONCE DAILY AS DIRECTED, Disp: 90 tablet, Rfl: 0 .  doxazosin (CARDURA) 4 MG tablet, , Disp: , Rfl: 1  Allergies:  Allergies  Allergen Reactions  . Methylprednisolone Other (See Comments)    Dizziness, passed out twice  . Moxifloxacin Hcl In Nacl Other (See Comments) and Itching    Pt c/o being "out of his head" & itching  . Ramipril Other (See Comments)    Renal function worsened on ACEi.    Past Medical History, Surgical history, Social history, and Family History were reviewed and updated.  Review of Systems: As above  Physical Exam:  weight is 212 lb (96.163 kg). His oral temperature is 98 F (36.7 C). His blood pressure is 124/68 and his pulse is 86. His respiration is 16.   Well-developed and well-nourished white woman. Head and neck exam shows no ocular or oral lesion.  He has no palpable cervical or supraclavicular lymph nodes. Lungs are clear. He does arouse, wheezes or rhonchi. Cardiac exam is regular rate and rhythm. He has an occasional extra beat. His he has a 1/6 systolic murmur. Abdomen soft. Has good bowel sounds. There is no fluid wave. There is no palpable liver or spleen tip. Extremities  shows no clubbing, cyanosis or edema. His compression stockings on his legs. Skin exam shows no rashes, ecchymosis or petechia. Neurological exam shows no focal neurological deficits.  Lab Results  Component Value Date   WBC 5.7 05/07/2014   HGB 10.9* 05/07/2014   HCT 33.6* 05/07/2014   MCV 96 05/07/2014   PLT 108* 05/07/2014     Chemistry      Component Value Date/Time   NA 139 04/09/2014 1531   NA 143 03/12/2014 0937   K 4.0 04/09/2014 1531   K 4.1 03/12/2014 0937   CL 105 04/09/2014 1531   CL 102 03/12/2014 0937   CO2 30 04/09/2014 1531   CO2 28 03/12/2014 0937   BUN 25* 04/09/2014 1531   BUN 24* 03/12/2014 0937   CREATININE 1.73* 04/09/2014 1531   CREATININE 1.9* 03/12/2014 0937        Component Value Date/Time   CALCIUM 9.2 04/09/2014 1531   CALCIUM 9.2 03/12/2014 0937   ALKPHOS 71 03/12/2014 0937   ALKPHOS 218* 10/26/2013 0430   AST 28 03/12/2014 0937   AST 44* 10/26/2013 0430   ALT 22 03/12/2014 0937   ALT 69* 10/26/2013 0430   BILITOT 1.30 03/12/2014 0937   BILITOT 1.1 10/26/2013 0430         Impression and Plan: [name] is a 81 year old gentleman. He has refractory anemia. We do not have to give him any Aranesp. He's done incredibly well with this.  On his blood smear, I do not see any evidence of transition over to leukemia.  His INR is 2.9. For him, this is perfect. I don't think we have to make any changes.  His platelet count is dropping a little bit but still well  His last iron studies done in February showed his iron saturation to be 50%. This I think is very good for him. his ferritin is 1312.   We will plan to him back here in another 8 weeks now.  I spent about 30 minutes with him today.   [name], MD 4/18/201611:14 AM

## 2014-06-25 ENCOUNTER — Ambulatory Visit (INDEPENDENT_AMBULATORY_CARE_PROVIDER_SITE_OTHER): Payer: Medicare Other

## 2014-06-25 ENCOUNTER — Encounter: Payer: Self-pay | Admitting: Podiatry

## 2014-06-25 ENCOUNTER — Ambulatory Visit (INDEPENDENT_AMBULATORY_CARE_PROVIDER_SITE_OTHER): Payer: Medicare Other | Admitting: Podiatry

## 2014-06-25 VITALS — BP 105/50 | HR 71 | Resp 16

## 2014-06-25 DIAGNOSIS — M779 Enthesopathy, unspecified: Secondary | ICD-10-CM

## 2014-06-25 DIAGNOSIS — R52 Pain, unspecified: Secondary | ICD-10-CM

## 2014-06-25 DIAGNOSIS — M205X2 Other deformities of toe(s) (acquired), left foot: Secondary | ICD-10-CM | POA: Diagnosis not present

## 2014-06-25 DIAGNOSIS — M21622 Bunionette of left foot: Secondary | ICD-10-CM

## 2014-06-25 MED ORDER — TRIAMCINOLONE ACETONIDE 10 MG/ML IJ SUSP
10.0000 mg | Freq: Once | INTRAMUSCULAR | Status: AC
  Administered 2014-06-25: 10 mg

## 2014-06-25 NOTE — Progress Notes (Signed)
Subjective:     Patient ID: [name], male   DOB: Oct 23, 1927, 81 y.o.   MRN: [id number]  HPI patient presents stating I am getting pain on the outside of my left foot and also my left fourth toe has been swollen and I wanted to make sure it was doing okay   Review of Systems     Objective:   Physical Exam  neurovascular status intact muscle strength adequate with discomfort around the peroneal insertion left foot with the left fourth toe also irritated with structural changes    Assessment:      tendinitis lateral side left foot along with digital deformity fourth toe left foot    Plan:      reviewed both conditions and at this time discussed hammertoe and possible repair the future and reviewed x-rays. For the lateral side of the foot I did do a peroneal injection adequate insertion 3 mg Kenalog 5 mg Xylocaine and instructed on reduced activity and reappoint to recheck

## 2014-07-02 ENCOUNTER — Other Ambulatory Visit (HOSPITAL_BASED_OUTPATIENT_CLINIC_OR_DEPARTMENT_OTHER): Payer: Medicare Other

## 2014-07-02 ENCOUNTER — Encounter: Payer: Self-pay | Admitting: Hematology & Oncology

## 2014-07-02 ENCOUNTER — Ambulatory Visit (HOSPITAL_BASED_OUTPATIENT_CLINIC_OR_DEPARTMENT_OTHER): Payer: Medicare Other | Admitting: Hematology & Oncology

## 2014-07-02 VITALS — BP 117/67 | HR 79 | Temp 97.4°F | Resp 16 | Wt 208.4 lb

## 2014-07-02 DIAGNOSIS — I4891 Unspecified atrial fibrillation: Secondary | ICD-10-CM

## 2014-07-02 DIAGNOSIS — D4620 Refractory anemia with excess of blasts, unspecified: Secondary | ICD-10-CM

## 2014-07-02 DIAGNOSIS — D46Z Other myelodysplastic syndromes: Secondary | ICD-10-CM

## 2014-07-02 LAB — CBC WITH DIFFERENTIAL (CANCER CENTER ONLY)
HCT: 32.8 % — ABNORMAL LOW (ref 38.7–49.9)
HEMOGLOBIN: 11 g/dL — AB (ref 13.0–17.1)
MCH: 31.8 pg (ref 28.0–33.4)
MCHC: 33.5 g/dL (ref 32.0–35.9)
MCV: 95 fL (ref 82–98)
Platelets: 101 10*3/uL — ABNORMAL LOW (ref 145–400)
RBC: 3.46 10*6/uL — AB (ref 4.20–5.70)
RDW: 14.9 % (ref 11.1–15.7)
WBC: 5.6 10*3/uL (ref 4.0–10.0)

## 2014-07-02 LAB — MANUAL DIFFERENTIAL (CHCC SATELLITE)
ALC: 1.8 10*3/uL (ref 0.9–3.3)
ANC (CHCC MAN DIFF): 3 10*3/uL (ref 1.5–6.5)
BASO: 1 % (ref 0–2)
LYMPH: 32 % (ref 14–48)
MONO: 13 % (ref 0–13)
Metamyelocytes: 5 % — ABNORMAL HIGH (ref 0–0)
Myelocytes: 2 % — ABNORMAL HIGH (ref 0–0)
PLT EST ~~LOC~~: DECREASED
SEG: 47 % (ref 40–75)

## 2014-07-02 LAB — IRON AND TIBC CHCC
%SAT: 62 % — ABNORMAL HIGH (ref 20–55)
IRON: 112 ug/dL (ref 42–163)
TIBC: 181 ug/dL — AB (ref 202–409)
UIBC: 69 ug/dL — ABNORMAL LOW (ref 117–376)

## 2014-07-02 LAB — PROTIME-INR (CHCC SATELLITE)
INR: 3.4 (ref 2.0–3.5)
Protime: 40.8 Seconds — ABNORMAL HIGH (ref 10.6–13.4)

## 2014-07-02 LAB — FERRITIN CHCC

## 2014-07-02 NOTE — Progress Notes (Signed)
Hematology and Oncology Follow Up Visit  [name] [id number] 06/15/1927 81 y.o. 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 He is feeling okay. His wife is doing better. She is having a lot of back issues. She's had some injections.  His been no issues with bleeding or bruising. He is on Coumadin and doing well with this.  He has a pacemaker in. This is for the atrial fibrillation. He's had no problems with this. He sees the cardiologist tomorrow.  He's had no problems with his appetite. He's had no nausea or vomiting.  His iron studies have been doing okay. He does have some extra iron. However, we don't have to do anything with this for right now.  When last saw him in April, his ferritin was 1465 with an iron saturation of 59%.  Overall, his performance status is ECOG 1   Medications:  Current outpatient prescriptions:  .  allopurinol (ZYLOPRIM) 300 MG tablet, Take 300 mg by mouth daily., Disp: , Rfl:  .  carvedilol (COREG) 6.25 MG tablet, Take 1 tablet (6.25 mg total) by mouth 2 (two) times daily with a meal., Disp: 60 tablet, Rfl: 6 .  doxazosin (CARDURA XL) 4 MG 24 hr tablet, Take 4 mg by mouth daily with breakfast., Disp: , Rfl:  .  famotidine (PEPCID) 20 MG tablet, Take 40 mg by mouth 2 (two) times daily. , Disp: , Rfl:  .  finasteride (PROSCAR) 5 MG tablet, Take 5 mg by mouth every evening. 5pm, Disp: , Rfl:  .  furosemide (LASIX) 40 MG tablet, TAKE ONE TABLET BY MOUTH ONCE DAILY., Disp: 90 tablet, Rfl: 3 .  Multiple Vitamin (MULTIVITAMIN WITH MINERALS) TABS tablet, Take 1 tablet by mouth daily., Disp: , Rfl:  .  pyridoxine (B-6) 100 MG tablet, Take 100 mg by mouth 2 (two) times daily. , Disp: , Rfl:  .  warfarin (COUMADIN) 5 MG tablet, TAKE ONE  TABLET BY MOUTH ONCE DAILY AS DIRECTED, Disp: 90 tablet, Rfl: 0  Allergies:  Allergies  Allergen Reactions  . Methylprednisolone Other (See Comments)    Dizziness, passed out twice  . Moxifloxacin Hcl In Nacl Other (See Comments) and Itching    Pt c/o being "out of his head" & itching  . Ramipril Other (See Comments)    Renal function worsened on ACEi.    Past Medical History, Surgical history, Social history, and Family History were reviewed and updated.  Review of Systems: As above  Physical Exam:  weight is 208 lb 6.4 oz (94.53 kg). His oral temperature is 97.4 F (36.3 C). His blood pressure is 117/67 and his pulse is 79. His respiration is 16.   Well-developed and well-nourished white woman. Head and neck exam shows no ocular or oral lesion.  He has no palpable cervical or supraclavicular lymph nodes. Lungs are clear. He does arouse, wheezes or rhonchi. Cardiac exam is regular rate and rhythm. He has an occasional extra beat. His he has a 1/6 systolic murmur. Abdomen is soft. He has good bowel sounds. There is no fluid wave. There is no palpable liver or spleen tip. Extremities shows no clubbing, cyanosis or edema. His compression stockings on his legs. Skin exam shows no rashes, ecchymosis or petechia. Neurological exam shows no  focal neurological deficits.  Lab Results  Component Value Date   WBC 5.6 07/02/2014   HGB 11.0* 07/02/2014   HCT 32.8* 07/02/2014   MCV 95 07/02/2014   PLT 101 Platelet count consistent in citrate* 07/02/2014     Chemistry      Component Value Date/Time   NA 146* 05/07/2014 1020   NA 139 04/09/2014 1531   K 4.0 05/07/2014 1020   K 4.0 04/09/2014 1531   CL 106 05/07/2014 1020   CL 105 04/09/2014 1531   CO2 29 05/07/2014 1020   CO2 30 04/09/2014 1531   BUN 21 05/07/2014 1020   BUN 25* 04/09/2014 1531   CREATININE 1.7* 05/07/2014 1020   CREATININE 1.73* 04/09/2014 1531      Component Value Date/Time   CALCIUM 9.4 05/07/2014 1020   CALCIUM  9.2 04/09/2014 1531   ALKPHOS 76 05/07/2014 1020   ALKPHOS 218* 10/26/2013 0430   AST 27 05/07/2014 1020   AST 44* 10/26/2013 0430   ALT 19 05/07/2014 1020   ALT 69* 10/26/2013 0430   BILITOT 1.40 05/07/2014 1020   BILITOT 1.1 10/26/2013 0430         Impression and Plan: [name] is a 81 year old gentleman. He has refractory anemia. We do not have to give him any Aranesp. He's done incredibly well with this.  On his blood smear, I do not see any evidence of transition over to leukemia.  His INR is 3.4. For him, this is perfect. I don't think we have to make any changes.  His platelet count is dropping a little bit but still okay.  I think that we can probably move his appointment was about every 3 months now. I think this will be reasonable. He is certainly okay with this.  I spent  about 30 minutes with him today.   [name], MD 6/13/20169:54 AM

## 2014-07-03 LAB — CARDIOLIPIN ANTIBODIES, IGG, IGM, IGA
ANTICARDIOLIPIN IGA: 7 U/mL (ref <22)
ANTICARDIOLIPIN IGM: 2 [MPL'U]/mL (ref <11)
Anticardiolipin IgG: 30 GPL U/mL — ABNORMAL HIGH (ref <23)

## 2014-07-09 ENCOUNTER — Telehealth: Payer: Self-pay | Admitting: Internal Medicine

## 2014-07-09 NOTE — Telephone Encounter (Signed)
Patient states, " I have a pacemaker and my heart rate generally runs from 80- 85.  I have  Noticed in the last week it is running 70-72 and I am not sure if I should be concerned with this or not. Routed to [facility] in device triage

## 2014-07-09 NOTE — Telephone Encounter (Signed)
Discussed programming lower rate change from 80bpm to 70bpm made in March during generator replacement and lead revision. Patient states he feels fine but wanted to make sure that the device was functioning properly. Confirmed appt with Dr. Rayann Heman 07/26/14.

## 2014-07-09 NOTE — Telephone Encounter (Signed)
New Message          Patient c/o Palpitations:  High priority if patient c/o lightheadedness and shortness of breath.  1. How long have you been having palpitations?  TODAY  2. Are you currently experiencing lightheadedness and shortness of breath?  NO  3. Have you checked your BP and heart rate? (document readings) Pt heart rare is 70 and 72 per min  4. Are you experiencing any other symptoms? NO

## 2014-07-25 ENCOUNTER — Other Ambulatory Visit: Payer: Self-pay

## 2014-07-26 ENCOUNTER — Encounter: Payer: Self-pay | Admitting: Internal Medicine

## 2014-07-26 ENCOUNTER — Ambulatory Visit (INDEPENDENT_AMBULATORY_CARE_PROVIDER_SITE_OTHER): Payer: Medicare Other | Admitting: Internal Medicine

## 2014-07-26 VITALS — BP 124/62 | HR 71 | Ht 74.0 in | Wt 209.4 lb

## 2014-07-26 DIAGNOSIS — I442 Atrioventricular block, complete: Secondary | ICD-10-CM

## 2014-07-26 DIAGNOSIS — Z950 Presence of cardiac pacemaker: Secondary | ICD-10-CM | POA: Diagnosis not present

## 2014-07-26 DIAGNOSIS — I4821 Permanent atrial fibrillation: Secondary | ICD-10-CM

## 2014-07-26 DIAGNOSIS — I482 Chronic atrial fibrillation: Secondary | ICD-10-CM

## 2014-07-26 DIAGNOSIS — I5022 Chronic systolic (congestive) heart failure: Secondary | ICD-10-CM | POA: Diagnosis not present

## 2014-07-26 NOTE — Progress Notes (Signed)
PCP: Penni Homans, MD Primary Cardiologist: [name] is a 81 y.o. male who presents today for routine electrophysiology followup.  Since recent lead revision (for RV lead failure), the patient reports doing very well.  He remains active.  He denies procedure related complications. Today, he denies symptoms of palpitations, chest pain, shortness of breath,  lower extremity edema, dizziness, presyncope, or syncope.  The patient is otherwise without complaint today.   Past Medical History  Diagnosis Date  . Bursitis     OF THE RIGHT SHOULDER  . Tinnitus   . Permanent atrial fibrillation     s/p AV nodal ablation  . Depressed ejection fraction 10    EF previously 45-50%, s/p AV nodal ablation and biv pacemaker implant, previously enrolled in Block HF and was randomized to RV pacing only)  . Blood transfusion     "several; Dr. Deberah Pelton" (04/11/2014)  . GERD (gastroesophageal reflux disease)   . MDS (myelodysplastic syndrome), low grade 02/10/2011  . Iron overload, transfusional 02/10/2011  . Complete heart block 12/28/06    s/p AV nodal ablation and BIV pacemaker implant at [facility] by Dr Rosita Fire  . CHF (congestive heart failure)   . Chronic bronchitis     "I keep it"  . H/O hiatal hernia   . Gout   . Presence of permanent cardiac pacemaker   . Chronic anemia     RECIEVES ARANESP SHOTS IF HIS HEMOGLOBIN FALLS BELOW 11  . Bone marrow disorder     "I don't make enough RBCs"  . Arthritis     "hands, back" (04/11/2014)  . Kidney stones 1985  . Chronic kidney disease (CKD)     "they work at ~ 30%" (04/11/2014)  . Pneumonia     "high school; [employer]; three times since, last time 10/2013" (04/11/2014)   Past Surgical History  Procedure Laterality Date  . Total knee arthroplasty Bilateral     right Dec.2011,left June 2012  . Joint replacement    . Pilonidal cyst excision  1970's  . Kidney stone surgery  1985    "tried to go up my penis; had to cut my stomach and  go in for lodged stone"  . Av node ablation  12/08    at [facility]  . Bunionectomy Bilateral 11/2012- 01/2013    "left-right; great toes; gout and bunion OR" (02/06/2013)  . Inguinal hernia repair Right   . Lithotripsy  1985  . Cataract extraction w/ intraocular lens  implant, bilateral Bilateral   . Permanent pacemaker generator change N/A 04/11/2013    Procedure: PERMANENT PACEMAKER GENERATOR CHANGE;  Surgeon: Evans Lance, MD;  Location: [facility] CATH LAB;  Service: Cardiovascular;  Laterality: N/A;  . Bi-ventricular pacemaker insertion (crt-p)  2008  . Insert / replace / remove pacemaker  12/28/2006;     MDT CRTP implanted 2008 by Dr Rosita Fire at [facility]; gen change 03/2013 by Dr Lovena Le  . Lead revision  04/11/2014  . Kidney stone surgery  after 1985 kidney stone OR    "put a tube in my back to collect stones"  . Lead revision N/A 04/11/2014    Procedure: LEAD REVISION;  Surgeon: [name], MD;  Location: [facility] CATH LAB;  Service: Cardiovascular;  Laterality: N/A;    ROS- all systems are reviewed and negative except as per HPI above  Current Outpatient Prescriptions  Medication Sig Dispense Refill  . allopurinol (ZYLOPRIM) 300 MG tablet Take 300 mg by mouth daily.    Marland Kitchen  carvedilol (COREG) 6.25 MG tablet Take 1 tablet (6.25 mg total) by mouth 2 (two) times daily with a meal. 60 tablet 6  . doxazosin (CARDURA XL) 4 MG 24 hr tablet Take 4 mg by mouth daily with breakfast.    . doxazosin (CARDURA) 4 MG tablet Take 4 mg by mouth at bedtime.    . famotidine (PEPCID) 20 MG tablet Take 40 mg by mouth 2 (two) times daily.     . finasteride (PROSCAR) 5 MG tablet Take 5 mg by mouth every evening. 5pm    . furosemide (LASIX) 40 MG tablet TAKE ONE TABLET BY MOUTH ONCE DAILY. 90 tablet 3  . Multiple Vitamin (MULTIVITAMIN WITH MINERALS) TABS tablet Take 1 tablet by mouth daily.    Marland Kitchen pyridoxine (B-6) 100 MG tablet Take 100 mg by mouth 2 (two) times daily.     Marland Kitchen warfarin (COUMADIN) 5 MG tablet  TAKE ONE TABLET BY MOUTH ONCE DAILY AS DIRECTED 90 tablet 0   No current facility-administered medications for this visit.    Physical Exam: Filed Vitals:   07/26/14 1011  BP: 124/62  Pulse: 71  Height: 6\' 2"  (1.88 m)  Weight: 94.983 kg (209 lb 6.4 oz)    GEN- The patient is well appearing, alert and oriented x 3 today.   Head- normocephalic, atraumatic Eyes-  Sclera clear, conjunctiva pink Ears- hearing intact Oropharynx- clear Lungs- Clear to ausculation bilaterally, normal work of breathing Chest- pacemaker pocket is well healed Heart- Regular rate and rhythm, no murmurs, rubs or gallops, PMI not laterally displaced GI- soft, NT, ND, + BS Extremities- no clubbing, cyanosis, or edema  Pacemaker interrogation- reviewed in detail today,  See PACEART report ekg today reveals afib with BIV pacing  Assessment and Plan:  1. Complete heart block Doing well s/p RV lead revision for RV fracture Normal BiV pacemaker function See Pace Art report No changes today Would return to LV tip-RV ring if thresholds allow upon return in 1 year  2.  Permanent atrial fibrillation Stable No change required today  3.  Chronic systolic dysfunction Stable [name] to follow monthly in the [facility] device clinic    [name] MD, [facility] 07/26/2014 10:56 AM

## 2014-07-26 NOTE — Patient Instructions (Signed)
Medication Instructions: - no changes  Labwork: - none  Procedures/Testing: - none  Follow-Up: - Remote monitoring is used to monitor your Pacemaker of ICD from home. This monitoring reduces the number of office visits required to check your device to one time per year. It allows [place] to keep an eye on the functioning of your device to ensure it is working properly. You are scheduled for a device check from home on 08/27/14 (fluid check only). You may send your transmission at any time that day. If you have a wireless device, the transmission will be sent automatically. After your physician reviews your transmission, you will receive a postcard with your next transmission date.  - Your physician wants you to follow-up in: 1 year with Dr. Rayann Heman. You will receive a reminder letter in the mail two months in advance. If you don't receive a letter, please call our office to schedule the follow-up appointment.  Any Additional Special Instructions Will Be Listed Below (If Applicable).

## 2014-07-27 ENCOUNTER — Telehealth: Payer: Self-pay | Admitting: *Deleted

## 2014-07-27 LAB — CUP PACEART INCLINIC DEVICE CHECK
Battery Voltage: 3 V
Brady Statistic AP VP Percent: 0 %
Brady Statistic AP VS Percent: 0 %
Brady Statistic AS VP Percent: 98.66 %
Brady Statistic AS VS Percent: 1.34 %
Lead Channel Impedance Value: 4047 Ohm
Lead Channel Impedance Value: 4047 Ohm
Lead Channel Impedance Value: 4047 Ohm
Lead Channel Impedance Value: 551 Ohm
Lead Channel Impedance Value: 608 Ohm
Lead Channel Impedance Value: 741 Ohm
Lead Channel Pacing Threshold Amplitude: 1.5 V
Lead Channel Pacing Threshold Pulse Width: 0.8 ms
Lead Channel Setting Pacing Amplitude: 2.5 V
Lead Channel Setting Pacing Pulse Width: 0.4 ms
Lead Channel Setting Pacing Pulse Width: 0.8 ms
Lead Channel Setting Sensing Sensitivity: 5.6 mV
MDC IDC MSMT BATTERY REMAINING LONGEVITY: 55 mo
MDC IDC MSMT LEADCHNL LV IMPEDANCE VALUE: 4047 Ohm
MDC IDC MSMT LEADCHNL RA IMPEDANCE VALUE: 4047 Ohm
MDC IDC MSMT LEADCHNL RV IMPEDANCE VALUE: 456 Ohm
MDC IDC MSMT LEADCHNL RV PACING THRESHOLD AMPLITUDE: 0.75 V
MDC IDC MSMT LEADCHNL RV PACING THRESHOLD PULSEWIDTH: 0.4 ms
MDC IDC SESS DTM: 20160707205125
MDC IDC SET LEADCHNL LV PACING AMPLITUDE: 2.5 V
MDC IDC STAT BRADY RA PERCENT PACED: 0 %
MDC IDC STAT BRADY RV PERCENT PACED: 98.66 %
Zone Setting Detection Interval: 350 ms
Zone Setting Detection Interval: 400 ms

## 2014-07-27 NOTE — Telephone Encounter (Signed)
Called patient to schedule Device Clinic appointment to reprogram RV output.  Patient aware and agreeable to appointment on 08/01/14 at 9:00am.

## 2014-08-01 ENCOUNTER — Ambulatory Visit (INDEPENDENT_AMBULATORY_CARE_PROVIDER_SITE_OTHER): Payer: Medicare Other | Admitting: *Deleted

## 2014-08-01 DIAGNOSIS — Z950 Presence of cardiac pacemaker: Secondary | ICD-10-CM

## 2014-08-01 NOTE — Progress Notes (Signed)
Confirmed RV and LV outputs at 2.5V.

## 2014-08-16 ENCOUNTER — Other Ambulatory Visit: Payer: Self-pay | Admitting: Hematology & Oncology

## 2014-08-24 ENCOUNTER — Encounter: Payer: Self-pay | Admitting: Behavioral Health

## 2014-08-24 ENCOUNTER — Telehealth: Payer: Self-pay | Admitting: Behavioral Health

## 2014-08-24 NOTE — Telephone Encounter (Signed)
Pre-Visit Call completed with patient and chart updated.   Pre-Visit Info documented in Specialty Comments under SnapShot.    

## 2014-08-27 ENCOUNTER — Encounter: Payer: Self-pay | Admitting: Family Medicine

## 2014-08-27 ENCOUNTER — Ambulatory Visit (INDEPENDENT_AMBULATORY_CARE_PROVIDER_SITE_OTHER): Payer: Medicare Other | Admitting: *Deleted

## 2014-08-27 ENCOUNTER — Ambulatory Visit (INDEPENDENT_AMBULATORY_CARE_PROVIDER_SITE_OTHER): Payer: Medicare Other | Admitting: Family Medicine

## 2014-08-27 VITALS — BP 120/78 | HR 82 | Temp 97.9°F | Ht 75.0 in | Wt 208.4 lb

## 2014-08-27 DIAGNOSIS — Z950 Presence of cardiac pacemaker: Secondary | ICD-10-CM | POA: Diagnosis not present

## 2014-08-27 DIAGNOSIS — M1000 Idiopathic gout, unspecified site: Secondary | ICD-10-CM | POA: Diagnosis not present

## 2014-08-27 DIAGNOSIS — Z0000 Encounter for general adult medical examination without abnormal findings: Secondary | ICD-10-CM

## 2014-08-27 DIAGNOSIS — D508 Other iron deficiency anemias: Secondary | ICD-10-CM | POA: Diagnosis not present

## 2014-08-27 DIAGNOSIS — I4891 Unspecified atrial fibrillation: Secondary | ICD-10-CM

## 2014-08-27 DIAGNOSIS — I5022 Chronic systolic (congestive) heart failure: Secondary | ICD-10-CM

## 2014-08-27 DIAGNOSIS — M10011 Idiopathic gout, right shoulder: Secondary | ICD-10-CM

## 2014-08-27 DIAGNOSIS — I4821 Permanent atrial fibrillation: Secondary | ICD-10-CM

## 2014-08-27 DIAGNOSIS — M109 Gout, unspecified: Secondary | ICD-10-CM | POA: Insufficient documentation

## 2014-08-27 DIAGNOSIS — N183 Chronic kidney disease, stage 3 unspecified: Secondary | ICD-10-CM

## 2014-08-27 DIAGNOSIS — I482 Chronic atrial fibrillation: Secondary | ICD-10-CM

## 2014-08-27 LAB — COMPREHENSIVE METABOLIC PANEL
ALBUMIN: 4.3 g/dL (ref 3.5–5.2)
ALK PHOS: 73 U/L (ref 39–117)
ALT: 17 U/L (ref 0–53)
AST: 20 U/L (ref 0–37)
BUN: 22 mg/dL (ref 6–23)
CHLORIDE: 106 meq/L (ref 96–112)
CO2: 28 mEq/L (ref 19–32)
CREATININE: 1.67 mg/dL — AB (ref 0.40–1.50)
Calcium: 9.5 mg/dL (ref 8.4–10.5)
GFR: 41.75 mL/min — ABNORMAL LOW (ref >60.00)
Glucose, Bld: 90 mg/dL (ref 70–99)
Potassium: 4.1 mEq/L (ref 3.5–5.1)
SODIUM: 142 meq/L (ref 135–145)
TOTAL PROTEIN: 7.5 g/dL (ref 6.0–8.3)
Total Bilirubin: 1.6 mg/dL — ABNORMAL HIGH (ref 0.2–1.2)

## 2014-08-27 LAB — TSH: TSH: 2.51 u[IU]/mL (ref 0.35–4.50)

## 2014-08-27 LAB — LIPID PANEL
Cholesterol: 136 mg/dL (ref 0–200)
HDL: 49.7 mg/dL
LDL Cholesterol: 72 mg/dL (ref 0–99)
NonHDL: 86.09
Total CHOL/HDL Ratio: 3
Triglycerides: 70 mg/dL (ref 0.0–149.0)
VLDL: 14 mg/dL (ref 0.0–40.0)

## 2014-08-27 LAB — URIC ACID: Uric Acid, Serum: 5.5 mg/dL (ref 4.0–7.8)

## 2014-08-27 LAB — CBC
HCT: 35.3 % — ABNORMAL LOW (ref 39.0–52.0)
Hemoglobin: 11.6 g/dL — ABNORMAL LOW (ref 13.0–17.0)
MCHC: 32.9 g/dL (ref 30.0–36.0)
MCV: 94.5 fl (ref 78.0–100.0)
Platelets: 119 10*3/uL — ABNORMAL LOW (ref 150.0–400.0)
RBC: 3.73 Mil/uL — ABNORMAL LOW (ref 4.22–5.81)
RDW: 15.8 % — ABNORMAL HIGH (ref 11.5–15.5)
WBC: 6.3 10*3/uL (ref 4.0–10.5)

## 2014-08-27 NOTE — Progress Notes (Signed)
Pre visit review using our clinic review tool, if applicable. No additional management support is needed unless otherwise documented below in the visit note. 

## 2014-08-27 NOTE — Patient Instructions (Addendum)
Salon Pas patches or gel over the counter for back pain.   Heart Failure Heart failure is a condition in which the heart has trouble pumping blood. This means your heart does not pump blood efficiently for your body to work well. In some cases of heart failure, fluid may back up into your lungs or you may have swelling (edema) in your lower legs. Heart failure is usually a long-term (chronic) condition. It is important for you to take good care of yourself and follow your health care provider's treatment plan. CAUSES  Some health conditions can cause heart failure. Those health conditions include:  High blood pressure (hypertension). Hypertension causes the heart muscle to work harder than normal. When pressure in the blood vessels is high, the heart needs to pump (contract) with more force in order to circulate blood throughout the body. High blood pressure eventually causes the heart to become stiff and weak.  Coronary artery disease (CAD). CAD is the buildup of cholesterol and fat (plaque) in the arteries of the heart. The blockage in the arteries deprives the heart muscle of oxygen and blood. This can cause chest pain and may lead to a heart attack. High blood pressure can also contribute to CAD.  Heart attack (myocardial infarction). A heart attack occurs when one or more arteries in the heart become blocked. The loss of oxygen damages the muscle tissue of the heart. When this happens, part of the heart muscle dies. The injured tissue does not contract as well and weakens the heart's ability to pump blood.  Abnormal heart valves. When the heart valves do not open and close properly, it can cause heart failure. This makes the heart muscle pump harder to keep the blood flowing.  Heart muscle disease (cardiomyopathy or myocarditis). Heart muscle disease is damage to the heart muscle from a variety of causes. These can include drug or alcohol abuse, infections, or unknown reasons. These can increase  the risk of heart failure.  Lung disease. Lung disease makes the heart work harder because the lungs do not work properly. This can cause a strain on the heart, leading it to fail.  Diabetes. Diabetes increases the risk of heart failure. High blood sugar contributes to high fat (lipid) levels in the blood. Diabetes can also cause slow damage to tiny blood vessels that carry important nutrients to the heart muscle. When the heart does not get enough oxygen and food, it can cause the heart to become weak and stiff. This leads to a heart that does not contract efficiently.  Other conditions can contribute to heart failure. These include abnormal heart rhythms, thyroid problems, and low blood counts (anemia). Certain unhealthy behaviors can increase the risk of heart failure, including:  Being overweight.  Smoking or chewing tobacco.  Eating foods high in fat and cholesterol.  Abusing illicit drugs or alcohol.  Lacking physical activity. SYMPTOMS  Heart failure symptoms may vary and can be hard to detect. Symptoms may include:  Shortness of breath with activity, such as climbing stairs.  Persistent cough.  Swelling of the feet, ankles, legs, or abdomen.  Unexplained weight gain.  Difficulty breathing when lying flat (orthopnea).  Waking from sleep because of the need to sit up and get more air.  Rapid heartbeat.  Fatigue and loss of energy.  Feeling light-headed, dizzy, or close to fainting.  Loss of appetite.  Nausea.  Increased urination during the night (nocturia). DIAGNOSIS  A diagnosis of heart failure is based on your history, symptoms,  physical examination, and diagnostic tests. Diagnostic tests for heart failure may include:  Echocardiography.  Electrocardiography.  Chest X-ray.  Blood tests.  Exercise stress test.  Cardiac angiography.  Radionuclide scans. TREATMENT  Treatment is aimed at managing the symptoms of heart failure. Medicines, behavioral  changes, or surgical intervention may be necessary to treat heart failure.  Medicines to help treat heart failure may include:  Angiotensin-converting enzyme (ACE) inhibitors. This type of medicine blocks the effects of a blood protein called angiotensin-converting enzyme. ACE inhibitors relax (dilate) the blood vessels and help lower blood pressure.  Angiotensin receptor blockers (ARBs). This type of medicine blocks the actions of a blood protein called angiotensin. Angiotensin receptor blockers dilate the blood vessels and help lower blood pressure.  Water pills (diuretics). Diuretics cause the kidneys to remove salt and water from the blood. The extra fluid is removed through urination. This loss of extra fluid lowers the volume of blood the heart pumps.  Beta blockers. These prevent the heart from beating too fast and improve heart muscle strength.  Digitalis. This increases the force of the heartbeat.  Healthy behavior changes include:  Obtaining and maintaining a healthy weight.  Stopping smoking or chewing tobacco.  Eating heart-healthy foods.  Limiting or avoiding alcohol.  Stopping illicit drug use.  Physical activity as directed by your health care provider.  Surgical treatment for heart failure may include:  A procedure to open blocked arteries, repair damaged heart valves, or remove damaged heart muscle tissue.  A pacemaker to improve heart muscle function and control certain abnormal heart rhythms.  An internal cardioverter defibrillator to treat certain serious abnormal heart rhythms.  A left ventricular assist device (LVAD) to assist the pumping ability of the heart. HOME CARE INSTRUCTIONS   Take medicines only as directed by your health care provider. Medicines are important in reducing the workload of your heart, slowing the progression of heart failure, and improving your symptoms.  Do not stop taking your medicine unless directed by your health care  provider.  Do not skip any dose of medicine.  Refill your prescriptions before you run out of medicine. Your medicines are needed every day.  Engage in moderate physical activity if directed by your health care provider. Moderate physical activity can benefit some people. The elderly and people with severe heart failure should consult with a health care provider for physical activity recommendations.  Eat heart-healthy foods. Food choices should be free of trans fat and low in saturated fat, cholesterol, and salt (sodium). Healthy choices include fresh or frozen fruits and vegetables, fish, lean meats, legumes, fat-free or low-fat dairy products, and whole grain or high fiber foods. Talk to a dietitian to learn more about heart-healthy foods.  Limit sodium if directed by your health care provider. Sodium restriction may reduce symptoms of heart failure in some people. Talk to a dietitian to learn more about heart-healthy seasonings.  Use healthy cooking methods. Healthy cooking methods include roasting, grilling, broiling, baking, poaching, steaming, or stir-frying. Talk to a dietitian to learn more about healthy cooking methods.  Limit fluids if directed by your health care provider. Fluid restriction may reduce symptoms of heart failure in some people.  Weigh yourself every day. Daily weights are important in the early recognition of excess fluid. You should weigh yourself every morning after you urinate and before you eat breakfast. Wear the same amount of clothing each time you weigh yourself. Record your daily weight. Provide your health care provider with your weight record.  Monitor and record your blood pressure if directed by your health care provider.  Check your pulse if directed by your health care provider.  Lose weight if directed by your health care provider. Weight loss may reduce symptoms of heart failure in some people.  Stop smoking or chewing tobacco. Nicotine makes your  heart work harder by causing your blood vessels to constrict. Do not use nicotine gum or patches before talking to your health care provider.  Keep all follow-up visits as directed by your health care provider. This is important.  Limit alcohol intake to no more than 1 drink per day for nonpregnant women and 2 drinks per day for men. One drink equals 12 ounces of beer, 5 ounces of wine, or 1 ounces of hard liquor. Drinking more than that is harmful to your heart. Tell your health care provider if you drink alcohol several times a week. Talk with your health care provider about whether alcohol is safe for you. If your heart has already been damaged by alcohol or you have severe heart failure, drinking alcohol should be stopped completely.  Stop illicit drug use.  Stay up-to-date with immunizations. It is especially important to prevent respiratory infections through current pneumococcal and influenza immunizations.  Manage other health conditions such as hypertension, diabetes, thyroid disease, or abnormal heart rhythms as directed by your health care provider.  Learn to manage stress.  Plan rest periods when fatigued.  Learn strategies to manage high temperatures. If the weather is extremely hot:  Avoid vigorous physical activity.  Use air conditioning or fans or seek a cooler location.  Avoid caffeine and alcohol.  Wear loose-fitting, lightweight, and light-colored clothing.  Learn strategies to manage cold temperatures. If the weather is extremely cold:  Avoid vigorous physical activity.  Layer clothes.  Wear mittens or gloves, a hat, and a scarf when going outside.  Avoid alcohol.  Obtain ongoing education and support as needed.  Participate in or seek rehabilitation as needed to maintain or improve independence and quality of life. SEEK MEDICAL CARE IF:   Your weight increases by 03 lb/1.4 kg in 1 day or 05 lb/2.3 kg in a week.  You have increasing shortness of breath  that is unusual for you.  You are unable to participate in your usual physical activities.  You tire easily.  You cough more than normal, especially with physical activity.  You have any or more swelling in areas such as your hands, feet, ankles, or abdomen.  You are unable to sleep because it is hard to breathe.  You feel like your heart is beating fast (palpitations).  You become dizzy or light-headed upon standing up. SEEK IMMEDIATE MEDICAL CARE IF:   You have difficulty breathing.  There is a change in mental status such as decreased alertness or difficulty with concentration.  You have a pain or discomfort in your chest.  You have an episode of fainting (syncope). MAKE SURE YOU:   Understand these instructions.  Will watch your condition.  Will get help right away if you are not doing well or get worse. Document Released: 01/05/2005 Document Revised: 05/22/2013 Document Reviewed: 02/05/2012 [facility] Patient Information 2015 [place], [place]. This information is not intended to replace advice given to you by your health care provider. Make sure you discuss any questions you have with your health care provider.

## 2014-08-27 NOTE — Assessment & Plan Note (Signed)
Improved s/p treatment by Dr Rush Farmer

## 2014-08-28 ENCOUNTER — Telehealth: Payer: Self-pay

## 2014-08-28 NOTE — Telephone Encounter (Signed)
ICM transmission received.  Left message for patient to call back

## 2014-08-28 NOTE — Progress Notes (Signed)
  EPIC Encounter for ICM Monitoring  Patient Name: [name] is a 81 y.o. male Date: 08/28/2014 Primary Care Physican: Penni Homans, MD Primary Cardiologist: Mare Ferrari Electrophysiologist: Allred Dry Weight: 208 lbs   BiV pacing is 99.5%      In the past month, have you:  1. Gained more than 2 pounds in a day or more than 5 pounds in a week? no  2. Had changes in your medications (with verification of current medications)? no  3. Had more shortness of breath than is usual for you? no  4. Limited your activity because of shortness of breath? no  5. Not been able to sleep because of shortness of breath? no  6. Had increased swelling in your feet or ankles? no  7. Had symptoms of dehydration (dizziness, dry mouth, increased thirst, decreased urine output) no  8. Had changes in sodium restriction? no  9. Been compliant with medication? Yes   ICM trend:      Follow-up plan: ICM clinic phone appointment 10/01/2014.  No changes today.  Copy of note sent to patient's primary care physician, primary cardiologist, and device following physician.  Rosalene Billings, RN, BSN 08/28/2014 12:26 PM

## 2014-08-28 NOTE — Telephone Encounter (Signed)
Spoke with patient.

## 2014-09-09 ENCOUNTER — Encounter: Payer: Self-pay | Admitting: Family Medicine

## 2014-09-09 DIAGNOSIS — Z0000 Encounter for general adult medical examination without abnormal findings: Secondary | ICD-10-CM | POA: Insufficient documentation

## 2014-09-09 HISTORY — DX: Encounter for general adult medical examination without abnormal findings: Z00.00

## 2014-09-09 NOTE — Assessment & Plan Note (Signed)
Tolerating coumadin, rate controlled 

## 2014-09-09 NOTE — Assessment & Plan Note (Signed)
No recent flares, maintain adequate hydration. No change in meds today, tolerating allopurinol

## 2014-09-09 NOTE — Assessment & Plan Note (Signed)
Mild, stable. Follows with hematology, warned regarding bleeding signs of Thrombocytopenia

## 2014-09-09 NOTE — Progress Notes (Signed)
Subjective:    Patient ID: [name], male    DOB: 1927/07/15, 81 y.o.   MRN: [id number]  Chief Complaint  Patient presents with  . Establish Care    HPI Patient is in today for new patient appt. Has a complicated PMH which includes atrial fibrillation, back pain, renal insufficiency, pleural effusion, hemochromocytosis, CHG and anemia. Actually doing well at the current time. No acute or recent illness. Is accompanied by his wife. Well controlled, no changes to meds. Encouraged heart healthy diet such as the DASH diet and exercise as tolerated.   Past Medical History  Diagnosis Date  . Bursitis     OF THE RIGHT SHOULDER  . Tinnitus   . Permanent atrial fibrillation     s/p AV nodal ablation  . Depressed ejection fraction 10    EF previously 45-50%, s/p AV nodal ablation and biv pacemaker implant, previously enrolled in Block HF and was randomized to RV pacing only)  . Blood transfusion     "several; Dr. Deberah Pelton" (04/11/2014)  . GERD (gastroesophageal reflux disease)   . MDS (myelodysplastic syndrome), low grade 02/10/2011  . Iron overload, transfusional 02/10/2011  . Complete heart block 12/28/06    s/p AV nodal ablation and BIV pacemaker implant at [facility] by Dr Rosita Fire  . CHF (congestive heart failure)   . Chronic bronchitis     "I keep it"  . H/O hiatal hernia   . Gout   . Presence of permanent cardiac pacemaker   . Chronic anemia     RECIEVES ARANESP SHOTS IF HIS HEMOGLOBIN FALLS BELOW 11  . Bone marrow disorder     "I don't make enough RBCs"  . Arthritis     "hands, back" (04/11/2014)  . Kidney stones 1985  . Chronic kidney disease (CKD)     "they work at ~ 30%" (04/11/2014)  . Pneumonia     "high school; [employer]; three times since, last time 10/2013" (04/11/2014)  . Gout 08/27/2014  . Preventative health care 09/09/2014    Dr Dorina Hoyer, urology, annually Dr Clover Mealy Dr Sharol Given Dr Ninfa Linden Dr Gershon Crane, opthamology Dr Rayann Heman cardiology Dr Marin Olp     Past  Surgical History  Procedure Laterality Date  . Total knee arthroplasty Bilateral     right Dec.2011,left June 2012  . Joint replacement    . Pilonidal cyst excision  1970's  . Kidney stone surgery  1985    "tried to go up my penis; had to cut my stomach and go in for lodged stone"  . Av node ablation  12/08    at [facility]  . Bunionectomy Bilateral 11/2012- 01/2013    "left-right; great toes; gout and bunion OR" (02/06/2013)  . Inguinal hernia repair Right   . Lithotripsy  1985  . Cataract extraction w/ intraocular lens  implant, bilateral Bilateral   . Permanent pacemaker generator change N/A 04/11/2013    Procedure: PERMANENT PACEMAKER GENERATOR CHANGE;  Surgeon: Evans Lance, MD;  Location: [facility] CATH LAB;  Service: Cardiovascular;  Laterality: N/A;  . Bi-ventricular pacemaker insertion (crt-p)  2008  . Insert / replace / remove pacemaker  12/28/2006;     MDT CRTP implanted 2008 by Dr Rosita Fire at [facility]; gen change 03/2013 by Dr Lovena Le  . Lead revision  04/11/2014  . Kidney stone surgery  after 1985 kidney stone OR    "put a tube in my back to collect stones"  . Lead revision N/A 04/11/2014    Procedure:  LEAD REVISION;  Surgeon: [name], MD;  Location: [facility] CATH LAB;  Service: Cardiovascular;  Laterality: N/A;    Family History  Problem Relation Age of Onset  . Cancer Mother   . Melanoma Mother   . COPD Father   . Kidney disease Brother   . Heart disease Maternal Grandfather     Social History   Social History  . Marital Status: Married    Spouse Name: N/A  . Number of Children: N/A  . Years of Education: N/A   Occupational History  . Not on file.   Social History Main Topics  . Smoking status: Former Smoker -- 1.00 packs/day for 25 years    Types: Cigarettes    Start date: 02/23/1944    Quit date: 02/10/1968  . Smokeless tobacco: Never Used  . Alcohol Use: 0.0 oz/week    0 Standard drinks or equivalent per week     Comment: 04/11/2014 "drank a little  bit from age [age]"  . Drug Use: No  . Sexual Activity: No     Comment: lives with wife, retired as a Scientist, research (life sciences), no dietary restrictions   Other Topics Concern  . Not on file   Social History Narrative    Outpatient Prescriptions Prior to Visit  Medication Sig Dispense Refill  . allopurinol (ZYLOPRIM) 300 MG tablet Take 300 mg by mouth daily.    . carvedilol (COREG) 6.25 MG tablet Take 1 tablet (6.25 mg total) by mouth 2 (two) times daily with a meal. 60 tablet 6  . famotidine (PEPCID) 20 MG tablet Take 40 mg by mouth 2 (two) times daily.     . finasteride (PROSCAR) 5 MG tablet Take 5 mg by mouth every evening. 5pm    . furosemide (LASIX) 40 MG tablet TAKE ONE TABLET BY MOUTH ONCE DAILY. 90 tablet 3  . Multiple Vitamin (MULTIVITAMIN WITH MINERALS) TABS tablet Take 1 tablet by mouth daily.    Marland Kitchen pyridoxine (B-6) 100 MG tablet Take 100 mg by mouth 2 (two) times daily.     Marland Kitchen warfarin (COUMADIN) 5 MG tablet TAKE ONE TABLET BY MOUTH ONCE DAILY AS DIRECTED 90 tablet 0   No facility-administered medications prior to visit.    Allergies  Allergen Reactions  . Methylprednisolone Other (See Comments)    Dizziness, passed out twice  . Moxifloxacin Hcl In Nacl Other (See Comments) and Itching    Pt c/o being "out of his head" & itching  . Ramipril Other (See Comments)    Renal function worsened on ACEi.    Review of Systems  Constitutional: Negative for fever, chills and malaise/fatigue.  HENT: Negative for congestion and hearing loss.   Eyes: Negative for discharge.  Respiratory: Negative for cough, sputum production and shortness of breath.   Cardiovascular: Negative for chest pain, palpitations and leg swelling.  Gastrointestinal: Negative for heartburn, nausea, vomiting, abdominal pain, diarrhea, constipation and blood in stool.  Genitourinary: Negative for dysuria, urgency, frequency and hematuria.  Musculoskeletal: Positive for back pain. Negative for myalgias and  falls.  Skin: Negative for rash.  Neurological: Negative for dizziness, sensory change, loss of consciousness, weakness and headaches.  Endo/Heme/Allergies: Negative for environmental allergies. Does not bruise/bleed easily.  Psychiatric/Behavioral: Negative for depression and suicidal ideas. The patient is not nervous/anxious and does not have insomnia.        Objective:    Physical Exam  Constitutional: He is oriented to person, place, and time. He appears well-developed and well-nourished. No distress.  HENT:  Head: Normocephalic and atraumatic.  Eyes: Conjunctivae are normal.  Neck: Neck supple. No thyromegaly present.  Cardiovascular: Normal rate, regular rhythm and normal heart sounds.   No murmur heard. Pulmonary/Chest: Effort normal and breath sounds normal. No respiratory distress. He has no wheezes.  Abdominal: Soft. Bowel sounds are normal. He exhibits no mass. There is no tenderness.  Musculoskeletal: He exhibits no edema.  Lymphadenopathy:    He has no cervical adenopathy.  Neurological: He is alert and oriented to person, place, and time.  Skin: Skin is warm and dry.  Psychiatric: He has a normal mood and affect. His behavior is normal.    BP 120/78 mmHg  Pulse 82  Temp(Src) 97.9 F (36.6 C) (Oral)  Ht 6\' 3"  (1.905 m)  Wt 208 lb 6 oz (94.518 kg)  BMI 26.05 kg/m2  SpO2 98% Wt Readings from Last 3 Encounters:  08/27/14 208 lb 6 oz (94.518 kg)  07/26/14 209 lb 6.4 oz (94.983 kg)  07/02/14 208 lb 6.4 oz (94.53 kg)     Lab Results  Component Value Date   WBC 6.3 08/27/2014   HGB 11.6* 08/27/2014   HCT 35.3* 08/27/2014   PLT 119.0* 08/27/2014   GLUCOSE 90 08/27/2014   CHOL 136 08/27/2014   TRIG 70.0 08/27/2014   HDL 49.70 08/27/2014   LDLCALC 72 08/27/2014   ALT 17 08/27/2014   AST 20 08/27/2014   NA 142 08/27/2014   K 4.1 08/27/2014   CL 106 08/27/2014   CREATININE 1.67* 08/27/2014   BUN 22 08/27/2014   CO2 28 08/27/2014   TSH 2.51 08/27/2014    INR 3.4 07/02/2014   HGBA1C 5.0 02/06/2013    Lab Results  Component Value Date   TSH 2.51 08/27/2014   Lab Results  Component Value Date   WBC 6.3 08/27/2014   HGB 11.6* 08/27/2014   HCT 35.3* 08/27/2014   MCV 94.5 08/27/2014   PLT 119.0* 08/27/2014   Lab Results  Component Value Date   NA 142 08/27/2014   K 4.1 08/27/2014   CO2 28 08/27/2014   GLUCOSE 90 08/27/2014   BUN 22 08/27/2014   CREATININE 1.67* 08/27/2014   BILITOT 1.6* 08/27/2014   ALKPHOS 73 08/27/2014   AST 20 08/27/2014   ALT 17 08/27/2014   PROT 7.5 08/27/2014   ALBUMIN 4.3 08/27/2014   CALCIUM 9.5 08/27/2014   ANIONGAP 12 10/26/2013   GFR 41.75* 08/27/2014   Lab Results  Component Value Date   CHOL 136 08/27/2014   Lab Results  Component Value Date   HDL 49.70 08/27/2014   Lab Results  Component Value Date   LDLCALC 72 08/27/2014   Lab Results  Component Value Date   TRIG 70.0 08/27/2014   Lab Results  Component Value Date   CHOLHDL 3 08/27/2014   Lab Results  Component Value Date   HGBA1C 5.0 02/06/2013       Assessment & Plan:   Problem List Items Addressed This Visit    Stage III chronic kidney disease    Stable on evaluation today      Preventative health care   Permanent atrial fibrillation    Tolerating coumadin, rate controlled      Relevant Medications   doxazosin (CARDURA) 4 MG tablet   Gout    No recent flares, maintain adequate hydration. No change in meds today, tolerating allopurinol      Relevant Orders   Uric acid (Completed)   Atrial fibrillation with controlled ventricular  response - Primary   Relevant Medications   doxazosin (CARDURA) 4 MG tablet   Other Relevant Orders   CBC (Completed)   Comprehensive metabolic panel (Completed)   Lipid panel (Completed)   TSH (Completed)   Anemia    Mild, stable. Follows with hematology, warned regarding bleeding signs of Thrombocytopenia      Relevant Orders   CBC (Completed)      I am having Mr.  Gappa maintain his pyridoxine, finasteride, famotidine, multivitamin with minerals, allopurinol, carvedilol, furosemide, warfarin, and doxazosin.  Meds ordered this encounter  Medications  . doxazosin (CARDURA) 4 MG tablet    Sig: Take 4 mg by mouth daily.     Penni Homans, MD

## 2014-09-09 NOTE — Assessment & Plan Note (Signed)
Stable on evaluation today

## 2014-09-13 ENCOUNTER — Encounter: Payer: Self-pay | Admitting: Cardiology

## 2014-09-13 ENCOUNTER — Ambulatory Visit (INDEPENDENT_AMBULATORY_CARE_PROVIDER_SITE_OTHER): Payer: Medicare Other | Admitting: Cardiology

## 2014-09-13 VITALS — BP 100/60 | HR 71 | Ht 75.0 in | Wt 208.0 lb

## 2014-09-13 DIAGNOSIS — I5022 Chronic systolic (congestive) heart failure: Secondary | ICD-10-CM | POA: Diagnosis not present

## 2014-09-13 DIAGNOSIS — I482 Chronic atrial fibrillation: Secondary | ICD-10-CM | POA: Diagnosis not present

## 2014-09-13 DIAGNOSIS — I4821 Permanent atrial fibrillation: Secondary | ICD-10-CM

## 2014-09-13 DIAGNOSIS — I442 Atrioventricular block, complete: Secondary | ICD-10-CM

## 2014-09-13 NOTE — Progress Notes (Signed)
Cardiology Office Note   Date:  09/13/2014   ID:  Clinton, Dragone October 25, 1927, MRN [id number]  PCP:  Penni Homans, MD  Cardiologist: Darlin Coco MD  Chief Complaint  Patient presents with  . Irregular Heart Beat      History of Present Illness: [name] is a 81 y.o. male who presents for a six-month follow-up office visit  This pleasant 81 year old gentleman is seen for a scheduled followup office visit. He has a history of chronic congestive heart failure. He was hospitalized for this at [facility] in 2009. He has a past history of established atrial fibrillation and had an ablation of his AV node with implantation of a biventricular pacemaker on 12/28/06 by Dr. Norville Haggard at [facility]. He was part of a research study however and the biventricular function was not turned on until recently when he switched his care to our office after the conclusion of the research study. The patient has been on chronic Coumadin for his atrial fibrillation.  He gets his Coumadin checked at Dr. Antonieta Pert office The patient has a history of depressed left ventricular function. His previous ejection fraction was 45-50%. A repeat echo was done on 03/23/12 to see if turning on the biventricular function has made any difference. Unfortunately the ejection fraction was 25%. However, the patient had a more recent echocardiogram on 01/17/14 which showed that his ejection fraction has risen to 30-35%.  In March 2016 he was found to have right ventricular pacemaker lead fracture.  On 04/11/14 he was admitted and received a new right ventricular lead. Clinically the patient continues to do well.  He denies chest pain or shortness of breath.  He's had no dizziness or syncope.  He is not aware of any palpitations.  His pacemaker is set at 70 bpm. This summer he was busy with his garden.  His wife has not been well however and he does not intend to plant a garden next year except for some  tomatoes.  Past Medical History  Diagnosis Date  . Bursitis     OF THE RIGHT SHOULDER  . Tinnitus   . Permanent atrial fibrillation     s/p AV nodal ablation  . Depressed ejection fraction 10    EF previously 45-50%, s/p AV nodal ablation and biv pacemaker implant, previously enrolled in Block HF and was randomized to RV pacing only)  . Blood transfusion     "several; Dr. Deberah Pelton" (04/11/2014)  . GERD (gastroesophageal reflux disease)   . MDS (myelodysplastic syndrome), low grade 02/10/2011  . Iron overload, transfusional 02/10/2011  . Complete heart block 12/28/06    s/p AV nodal ablation and BIV pacemaker implant at [facility] by Dr Rosita Fire  . CHF (congestive heart failure)   . Chronic bronchitis     "I keep it"  . H/O hiatal hernia   . Gout   . Presence of permanent cardiac pacemaker   . Chronic anemia     RECIEVES ARANESP SHOTS IF HIS HEMOGLOBIN FALLS BELOW 11  . Bone marrow disorder     "I don't make enough RBCs"  . Arthritis     "hands, back" (04/11/2014)  . Kidney stones 1985  . Chronic kidney disease (CKD)     "they work at ~ 30%" (04/11/2014)  . Pneumonia     "high school; [employer]; three times since, last time 10/2013" (04/11/2014)  . Gout 08/27/2014  . Preventative health care 09/09/2014    Dr Dorina Hoyer,  urology, annually Dr Clover Mealy Dr Sharol Given Dr Ninfa Linden Dr Gershon Crane, opthamology Dr Rayann Heman cardiology Dr Marin Olp     Past Surgical History  Procedure Laterality Date  . Total knee arthroplasty Bilateral     right Dec.2011,left June 2012  . Joint replacement    . Pilonidal cyst excision  1970's  . Kidney stone surgery  1985    "tried to go up my penis; had to cut my stomach and go in for lodged stone"  . Av node ablation  12/08    at [facility]  . Bunionectomy Bilateral 11/2012- 01/2013    "left-right; great toes; gout and bunion OR" (02/06/2013)  . Inguinal hernia repair Right   . Lithotripsy  1985  . Cataract extraction w/ intraocular lens  implant,  bilateral Bilateral   . Permanent pacemaker generator change N/A 04/11/2013    Procedure: PERMANENT PACEMAKER GENERATOR CHANGE;  Surgeon: Evans Lance, MD;  Location: [facility] CATH LAB;  Service: Cardiovascular;  Laterality: N/A;  . Bi-ventricular pacemaker insertion (crt-p)  2008  . Insert / replace / remove pacemaker  12/28/2006;     MDT CRTP implanted 2008 by Dr Rosita Fire at [facility]; gen change 03/2013 by Dr Lovena Le  . Lead revision  04/11/2014  . Kidney stone surgery  after 1985 kidney stone OR    "put a tube in my back to collect stones"  . Lead revision N/A 04/11/2014    Procedure: LEAD REVISION;  Surgeon: [name], MD;  Location: [facility] CATH LAB;  Service: Cardiovascular;  Laterality: N/A;     Current Outpatient Prescriptions  Medication Sig Dispense Refill  . allopurinol (ZYLOPRIM) 300 MG tablet Take 300 mg by mouth daily.    . carvedilol (COREG) 6.25 MG tablet Take 1 tablet (6.25 mg total) by mouth 2 (two) times daily with a meal. 60 tablet 6  . doxazosin (CARDURA) 4 MG tablet Take 4 mg by mouth daily.    . famotidine (PEPCID) 20 MG tablet Take 40 mg by mouth 2 (two) times daily.     . finasteride (PROSCAR) 5 MG tablet Take 5 mg by mouth every evening. 5pm    . furosemide (LASIX) 40 MG tablet TAKE ONE TABLET BY MOUTH ONCE DAILY. 90 tablet 3  . Multiple Vitamin (MULTIVITAMIN WITH MINERALS) TABS tablet Take 1 tablet by mouth daily.    Marland Kitchen pyridoxine (B-6) 100 MG tablet Take 100 mg by mouth 2 (two) times daily.     Marland Kitchen warfarin (COUMADIN) 5 MG tablet TAKE ONE TABLET BY MOUTH ONCE DAILY AS DIRECTED 90 tablet 0   No current facility-administered medications for this visit.    Allergies:   Methylprednisolone; Moxifloxacin hcl in nacl; and Ramipril    Social History:  The patient  reports that he quit smoking about 46 years ago. His smoking use included Cigarettes. He started smoking about 70 years ago. He has a 25 pack-year smoking history. He has never used smokeless tobacco. He reports that he  drinks alcohol. He reports that he does not use illicit drugs.   Family History:  The patient's family history includes COPD in his father; Cancer in his mother; Heart disease in his maternal grandfather; Kidney disease in his brother; Melanoma in his mother. There is no history of Heart attack, Hypertension, or Stroke.    ROS:  Please see the history of present illness.   Otherwise, review of systems are positive for none.   All other systems are reviewed and negative.    PHYSICAL EXAM: VS:  BP 100/60 mmHg  Pulse 71  Ht 6\' 3"  (1.905 m)  Wt 208 lb (94.348 kg)  BMI 26.00 kg/m2 , BMI Body mass index is 26 kg/(m^2). GEN: Well nourished, well developed, in no acute distress HEENT: normal Neck: no JVD, carotid bruits, or masses Cardiac: RRR; no murmurs, rubs, or gallops,no edema  Respiratory:  clear to auscultation bilaterally, normal work of breathing GI: soft, nontender, nondistended, + BS MS: no deformity or atrophy Skin: warm and dry, no rash Neuro:  Strength and sensation are intact Psych: euthymic mood, full affect   EKG:  EKG is ordered today. The ekg ordered today demonstrates ventricular paced rhythm at 71 bpm.  Underlying atrial fibrillation.   Recent Labs: 08/27/2014: ALT 17; BUN 22; Creatinine, Ser 1.67*; Hemoglobin 11.6*; Platelets 119.0*; Potassium 4.1; Sodium 142; TSH 2.51    Lipid Panel    Component Value Date/Time   CHOL 136 08/27/2014 1202   TRIG 70.0 08/27/2014 1202   HDL 49.70 08/27/2014 1202   CHOLHDL 3 08/27/2014 1202   VLDL 14.0 08/27/2014 1202   LDLCALC 72 08/27/2014 1202      Wt Readings from Last 3 Encounters:  09/13/14 208 lb (94.348 kg)  08/27/14 208 lb 6 oz (94.518 kg)  07/26/14 209 lb 6.4 oz (94.983 kg)        ASSESSMENT AND PLAN: 1. chronic systolic left ventricular dysfunction. Recent ejection fraction 30-35% by echocardiogram 01/17/14 2. chronic permanent atrial fibrillation with previous ablation of AV node and with biventricular  pacemaker implanted 12/28/06 at [facility]  Replacement of right ventricular lead on 04/11/14 3. myelodysplastic syndrome followed by Dr. Marin Olp. 4. past history of gout 5. chronic kidney disease 6.  Chronic Coumadin anticoagulation followed by Dr. Marin Olp   Current medicines are reviewed at length with the patient today.  The patient does not have concerns regarding medicines.  The following changes have been made:  no change  Labs/ tests ordered today include:   Orders Placed This Encounter  Procedures  . EKG 12-Lead    Disposition: Continue current medication.  Recheck in 6 months for office visit.  Berna Spare MD 09/13/2014 10:28 AM    [address], [place], [place]  [address] Phone: [phone]; Fax: [phone]

## 2014-09-13 NOTE — Patient Instructions (Signed)
Medication Instructions:  Your physician recommends that you continue on your current medications as directed. Please refer to the Current Medication list given to you today.  Labwork: none  Testing/Procedures: none  Follow-Up: Your physician wants you to follow-up in: 6 month ov You will receive a reminder letter in the mail two months in advance. If you don't receive a letter, please call our office to schedule the follow-up appointment.    

## 2014-09-17 ENCOUNTER — Other Ambulatory Visit: Payer: Self-pay | Admitting: Cardiology

## 2014-09-19 ENCOUNTER — Other Ambulatory Visit: Payer: Self-pay | Admitting: *Deleted

## 2014-09-19 MED ORDER — CARVEDILOL 6.25 MG PO TABS: 6.2500 mg | ORAL_TABLET | Freq: Two times a day (BID) | ORAL | Status: DC

## 2014-10-01 ENCOUNTER — Ambulatory Visit (INDEPENDENT_AMBULATORY_CARE_PROVIDER_SITE_OTHER): Payer: Medicare Other

## 2014-10-01 DIAGNOSIS — I5022 Chronic systolic (congestive) heart failure: Secondary | ICD-10-CM

## 2014-10-01 DIAGNOSIS — Z950 Presence of cardiac pacemaker: Secondary | ICD-10-CM | POA: Diagnosis not present

## 2014-10-02 ENCOUNTER — Encounter: Payer: Self-pay | Admitting: Hematology & Oncology

## 2014-10-02 ENCOUNTER — Ambulatory Visit (HOSPITAL_BASED_OUTPATIENT_CLINIC_OR_DEPARTMENT_OTHER): Payer: Medicare Other | Admitting: Hematology & Oncology

## 2014-10-02 ENCOUNTER — Other Ambulatory Visit (HOSPITAL_BASED_OUTPATIENT_CLINIC_OR_DEPARTMENT_OTHER): Payer: Medicare Other

## 2014-10-02 VITALS — BP 116/64 | HR 86 | Temp 97.5°F | Resp 16 | Ht 75.0 in | Wt 210.0 lb

## 2014-10-02 DIAGNOSIS — D46Z Other myelodysplastic syndromes: Secondary | ICD-10-CM

## 2014-10-02 DIAGNOSIS — D4620 Refractory anemia with excess of blasts, unspecified: Secondary | ICD-10-CM

## 2014-10-02 DIAGNOSIS — I4891 Unspecified atrial fibrillation: Secondary | ICD-10-CM

## 2014-10-02 LAB — IRON AND TIBC CHCC
%SAT: 25 % (ref 20–55)
IRON: 45 ug/dL (ref 42–163)
TIBC: 180 ug/dL — AB (ref 202–409)
UIBC: 135 ug/dL (ref 117–376)

## 2014-10-02 LAB — MANUAL DIFFERENTIAL (CHCC SATELLITE)
ALC: 2 10*3/uL (ref 0.9–3.3)
ANC (CHCC HP manual diff): 4.7 10*3/uL (ref 1.5–6.5)
BAND NEUTROPHILS: 1 % (ref 0–10)
Eos: 2 % (ref 0–7)
LYMPH: 25 % (ref 14–48)
MONO: 14 % — ABNORMAL HIGH (ref 0–13)
PLATELET MORPHOLOGY: NORMAL
PLT EST ~~LOC~~: DECREASED
SEG: 58 % (ref 40–75)

## 2014-10-02 LAB — PROTIME-INR (CHCC SATELLITE)
INR: 3.3 (ref 2.0–3.5)
Protime: 39.6 Seconds — ABNORMAL HIGH (ref 10.6–13.4)

## 2014-10-02 LAB — CBC WITH DIFFERENTIAL (CANCER CENTER ONLY)
HCT: 31.2 % — ABNORMAL LOW (ref 38.7–49.9)
HGB: 10.3 g/dL — ABNORMAL LOW (ref 13.0–17.1)
MCH: 31.5 pg (ref 28.0–33.4)
MCHC: 33 g/dL (ref 32.0–35.9)
MCV: 95 fL (ref 82–98)
Platelets: 94 10*3/uL — ABNORMAL LOW (ref 145–400)
RBC: 3.27 10*6/uL — ABNORMAL LOW (ref 4.20–5.70)
RDW: 14.6 % (ref 11.1–15.7)
WBC: 8 10*3/uL (ref 4.0–10.0)

## 2014-10-02 LAB — FERRITIN CHCC

## 2014-10-02 LAB — CHCC SATELLITE - SMEAR

## 2014-10-02 NOTE — Progress Notes (Signed)
Patient going to receive flu vaccine at PCP

## 2014-10-02 NOTE — Progress Notes (Signed)
He is  Hematology and Oncology Follow Up Visit  [name] [id number] 18-Mar-1927 81 y.o. 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 He is feeling okay. His wife is not doing too well. She is having a lot of back issues. She turns 80 pretty soon.  He has had no problems with the Coumadin. He's had no bleeding.  We are watching his iron levels. We, they will, have not had to transfuse him for many years. We last saw him in June, his ferritin was 1004-99 saturation of 62%.   He had a very good summer with his garden. His garden was quite productive.  Overall, his performance status is ECOG 1   Medications:  Current outpatient prescriptions:  .  allopurinol (ZYLOPRIM) 300 MG tablet, Take 300 mg by mouth daily., Disp: , Rfl:  .  carvedilol (COREG) 6.25 MG tablet, Take 1 tablet (6.25 mg total) by mouth 2 (two) times daily with a meal., Disp: 180 tablet, Rfl: 1 .  doxazosin (CARDURA) 4 MG tablet, Take 4 mg by mouth daily., Disp: , Rfl:  .  famotidine (PEPCID) 20 MG tablet, Take 40 mg by mouth 2 (two) times daily. , Disp: , Rfl:  .  finasteride (PROSCAR) 5 MG tablet, Take 5 mg by mouth every evening. 5pm, Disp: , Rfl:  .  furosemide (LASIX) 40 MG tablet, TAKE ONE TABLET BY MOUTH ONCE DAILY., Disp: 90 tablet, Rfl: 3 .  Multiple Vitamin (MULTIVITAMIN WITH MINERALS) TABS tablet, Take 1 tablet by mouth daily., Disp: , Rfl:  .  pyridoxine (B-6) 100 MG tablet, Take 100 mg by mouth 2 (two) times daily. , Disp: , Rfl:  .  warfarin (COUMADIN) 5 MG tablet, TAKE ONE TABLET BY MOUTH ONCE DAILY AS DIRECTED, Disp: 90 tablet, Rfl: 0  Allergies:  Allergies  Allergen Reactions  . Methylprednisolone Other (See Comments)    Dizziness, passed out twice  . Moxifloxacin Hcl In Nacl  Other (See Comments) and Itching    Pt c/o being "out of his head" & itching  . Ramipril Other (See Comments)    Renal function worsened on ACEi.    Past Medical History, Surgical history, Social history, and Family History were reviewed and updated.  Review of Systems: As above  Physical Exam:  height is 6\' 3"  (1.905 m) and weight is 210 lb (95.255 kg). His oral temperature is 97.5 F (36.4 C). His blood pressure is 116/64 and his pulse is 86. His respiration is 16.   Well-developed and well-nourished white woman. Head and neck exam shows no ocular or oral lesion.  He has no palpable cervical or supraclavicular lymph nodes. Lungs are clear. He does arouse, wheezes or rhonchi. Cardiac exam is regular rate and rhythm. He has an occasional extra beat. His he has a 1/6 systolic murmur. Abdomen is soft. He has good bowel sounds. There is no fluid wave. There is no palpable liver or spleen tip. Extremities shows no clubbing, cyanosis or edema. His compression stockings on his legs. Skin exam shows no rashes, ecchymosis or petechia. Neurological exam shows no focal neurological deficits.  Lab Results  Component Value Date   WBC 8.0 10/02/2014   HGB 10.3* 10/02/2014   HCT 31.2* 10/02/2014   MCV 95 10/02/2014  PLT 94* 10/02/2014     Chemistry      Component Value Date/Time   NA 142 08/27/2014 1202   NA 146* 05/07/2014 1020   K 4.1 08/27/2014 1202   K 4.0 05/07/2014 1020   CL 106 08/27/2014 1202   CL 106 05/07/2014 1020   CO2 28 08/27/2014 1202   CO2 29 05/07/2014 1020   BUN 22 08/27/2014 1202   BUN 21 05/07/2014 1020   CREATININE 1.67* 08/27/2014 1202   CREATININE 1.7* 05/07/2014 1020      Component Value Date/Time   CALCIUM 9.5 08/27/2014 1202   CALCIUM 9.4 05/07/2014 1020   ALKPHOS 73 08/27/2014 1202   ALKPHOS 76 05/07/2014 1020   AST 20 08/27/2014 1202   AST 27 05/07/2014 1020   ALT 17 08/27/2014 1202   ALT 19 05/07/2014 1020   BILITOT 1.6* 08/27/2014 1202   BILITOT  1.40 05/07/2014 1020         Impression and Plan: [name] is a 81 year old gentleman. He has refractory anemia. We do not have to give him any Aranesp. He's done incredibly well with this.  On his blood smear, I do not see any evidence of transition over to leukemia.  His INR is 3.3  For him, this is perfect. I don't think we have to make any changes.  His platelet count is dropping a little bit but still okay.  I think that we can probably get him back in 3 months now. I think this will be reasonable. He is certainly okay with this.  I spent  about 30 minutes with him today.   [name], MD 9/13/20169:43 AM

## 2014-10-03 ENCOUNTER — Telehealth: Payer: Self-pay

## 2014-10-03 NOTE — Telephone Encounter (Signed)
Spoke with patient.

## 2014-10-03 NOTE — Progress Notes (Signed)
EPIC Encounter for ICM Monitoring  Patient Name: [name] is a 81 y.o. male Date: 10/03/2014 Primary Care Physican: Penni Homans, MD Primary Cardiologist: Mare Ferrari Electrophysiologist: Allred Dry Weight: 208 lbs       In the past month, have you:  1. Gained more than 2 pounds in a day or more than 5 pounds in a week? no  2. Had changes in your medications (with verification of current medications)? no  3. Had more shortness of breath than is usual for you? no  4. Limited your activity because of shortness of breath? no  5. Not been able to sleep because of shortness of breath? no  6. Had increased swelling in your feet or ankles? no  7. Had symptoms of dehydration (dizziness, dry mouth, increased thirst, decreased urine output) no  8. Had changes in sodium restriction? no  9. Been compliant with medication? Yes   ICM trend:   Follow-up plan: ICM clinic phone appointment 11/06/2014.  Optivol transmission revealed trending along baseline.  He denied any HF symptoms.  No changes today.  Copy of note sent to patient's primary care physician, primary cardiologist, and device following physician.  Rosalene Billings, RN, CCM 10/03/2014 1:32 PM

## 2014-10-03 NOTE — Telephone Encounter (Signed)
ICM transmission received.  Attempted call to patient and left message for return call. 

## 2014-10-26 ENCOUNTER — Telehealth: Payer: Self-pay

## 2014-10-26 NOTE — Telephone Encounter (Signed)
Patient left message for call back regarding next transmission date.   Spoke with patient.  I stated next transmission date is 11/06/2014.  He stated he will write on his calendar.

## 2014-11-06 ENCOUNTER — Encounter: Payer: Self-pay | Admitting: Cardiology

## 2014-11-06 ENCOUNTER — Ambulatory Visit (INDEPENDENT_AMBULATORY_CARE_PROVIDER_SITE_OTHER): Payer: Medicare Other | Admitting: *Deleted

## 2014-11-06 DIAGNOSIS — Z950 Presence of cardiac pacemaker: Secondary | ICD-10-CM

## 2014-11-06 DIAGNOSIS — I442 Atrioventricular block, complete: Secondary | ICD-10-CM | POA: Diagnosis not present

## 2014-11-06 DIAGNOSIS — I5022 Chronic systolic (congestive) heart failure: Secondary | ICD-10-CM

## 2014-11-06 LAB — CUP PACEART REMOTE DEVICE CHECK
Brady Statistic AP VP Percent: 0 %
Brady Statistic AP VS Percent: 0 %
Brady Statistic AS VP Percent: 100 %
Brady Statistic AS VS Percent: 0 %
Brady Statistic RV Percent Paced: 100 %
Date Time Interrogation Session: 20161018154342
Implantable Lead Implant Date: 20081209
Implantable Lead Location: 753858
Implantable Lead Model: 4193
Lead Channel Impedance Value: 4047 Ohm
Lead Channel Impedance Value: 4047 Ohm
Lead Channel Impedance Value: 4047 Ohm
Lead Channel Impedance Value: 494 Ohm
Lead Channel Impedance Value: 532 Ohm
Lead Channel Impedance Value: 665 Ohm
Lead Channel Pacing Threshold Pulse Width: 0.4 ms
Lead Channel Sensing Intrinsic Amplitude: 31.625 mV
Lead Channel Sensing Intrinsic Amplitude: 31.625 mV
Lead Channel Setting Pacing Amplitude: 2.5 V
Lead Channel Setting Sensing Sensitivity: 5.6 mV
MDC IDC LEAD IMPLANT DT: 20160323
MDC IDC LEAD LOCATION: 753860
MDC IDC MSMT BATTERY REMAINING LONGEVITY: 56 mo
MDC IDC MSMT BATTERY VOLTAGE: 3 V
MDC IDC MSMT LEADCHNL LV IMPEDANCE VALUE: 4047 Ohm
MDC IDC MSMT LEADCHNL RA IMPEDANCE VALUE: 4047 Ohm
MDC IDC MSMT LEADCHNL RV IMPEDANCE VALUE: 418 Ohm
MDC IDC MSMT LEADCHNL RV PACING THRESHOLD AMPLITUDE: 0.625 V
MDC IDC MSMT LEADCHNL RV SENSING INTR AMPL: 15.625 mV
MDC IDC MSMT LEADCHNL RV SENSING INTR AMPL: 15.625 mV
MDC IDC SET LEADCHNL LV PACING AMPLITUDE: 2.5 V
MDC IDC SET LEADCHNL LV PACING PULSEWIDTH: 0.8 ms
MDC IDC SET LEADCHNL RV PACING PULSEWIDTH: 0.4 ms
MDC IDC STAT BRADY RA PERCENT PACED: 0 %

## 2014-11-06 NOTE — Progress Notes (Signed)
Remote pacemaker transmission.   

## 2014-11-07 ENCOUNTER — Telehealth: Payer: Self-pay

## 2014-11-07 NOTE — Telephone Encounter (Signed)
ICM transmission received.  Attempted patient call and wife stated he was outside and not available.  She requested I call back in about 45 minutes.

## 2014-11-07 NOTE — Telephone Encounter (Signed)
Spoke with patient.

## 2014-11-07 NOTE — Progress Notes (Addendum)
EPIC Encounter for ICM Monitoring  Patient Name: [name] is a 81 y.o. male Date: 11/07/2014 Primary Care Physican: Penni Homans, MD Primary Cardiologist: Mare Ferrari Electrophysiologist: Allred Dry Weight: 204 lbs       Bi-V Pacing 99.7%  In the past month, have you:  1. Gained more than 2 pounds in a day or more than 5 pounds in a week? no  2. Had changes in your medications (with verification of current medications)? no  3. Had more shortness of breath than is usual for you? no  4. Limited your activity because of shortness of breath? no  5. Not been able to sleep because of shortness of breath? no  6. Had increased swelling in your feet or ankles? no  7. Had symptoms of dehydration (dizziness, dry mouth, increased thirst, decreased urine output) no  8. Had changes in sodium restriction? no  9. Been compliant with medication? Yes   ICM trend:   Follow-up plan: ICM clinic phone appointment on 12/10/2014.  Optivol impedance below baseline ~ 10/28/2014 to 11/05/2014 and he denied any symptoms.  He reported no changes and unsure why he would be retaining fluid.  90 day zoom showed impedance returning toward baseline on transmission day, 11/06/2014.  Education given high sodium foods and/or drinking excessive amount of fluids may cause fluid retention.  No changes today and instructed him to call should he experience any.   Copy of note sent to patient's primary care physician, primary cardiologist, and device following physician.  Rosalene Billings, RN, CCM 11/07/2014 10:33 AM

## 2014-11-11 ENCOUNTER — Encounter (HOSPITAL_BASED_OUTPATIENT_CLINIC_OR_DEPARTMENT_OTHER): Payer: Self-pay | Admitting: Emergency Medicine

## 2014-11-11 ENCOUNTER — Inpatient Hospital Stay (HOSPITAL_BASED_OUTPATIENT_CLINIC_OR_DEPARTMENT_OTHER)
Admission: EM | Admit: 2014-11-11 | Discharge: 2014-11-13 | DRG: 445 | Disposition: A | Discharge status: Home / Self Care | Payer: Medicare Other | Attending: Internal Medicine | Admitting: Internal Medicine

## 2014-11-11 ENCOUNTER — Emergency Department (HOSPITAL_BASED_OUTPATIENT_CLINIC_OR_DEPARTMENT_OTHER): Payer: Medicare Other

## 2014-11-11 ENCOUNTER — Inpatient Hospital Stay (HOSPITAL_COMMUNITY): Payer: Medicare Other

## 2014-11-11 DIAGNOSIS — Z79899 Other long term (current) drug therapy: Secondary | ICD-10-CM

## 2014-11-11 DIAGNOSIS — D4620 Refractory anemia with excess of blasts, unspecified: Secondary | ICD-10-CM | POA: Diagnosis present

## 2014-11-11 DIAGNOSIS — I5022 Chronic systolic (congestive) heart failure: Secondary | ICD-10-CM | POA: Diagnosis present

## 2014-11-11 DIAGNOSIS — Z7901 Long term (current) use of anticoagulants: Secondary | ICD-10-CM | POA: Diagnosis not present

## 2014-11-11 DIAGNOSIS — N183 Chronic kidney disease, stage 3 unspecified: Secondary | ICD-10-CM | POA: Diagnosis present

## 2014-11-11 DIAGNOSIS — Z961 Presence of intraocular lens: Secondary | ICD-10-CM | POA: Diagnosis present

## 2014-11-11 DIAGNOSIS — I4891 Unspecified atrial fibrillation: Secondary | ICD-10-CM | POA: Diagnosis not present

## 2014-11-11 DIAGNOSIS — Z9841 Cataract extraction status, right eye: Secondary | ICD-10-CM | POA: Diagnosis not present

## 2014-11-11 DIAGNOSIS — Z87891 Personal history of nicotine dependence: Secondary | ICD-10-CM | POA: Diagnosis not present

## 2014-11-11 DIAGNOSIS — R945 Abnormal results of liver function studies: Secondary | ICD-10-CM

## 2014-11-11 DIAGNOSIS — R109 Unspecified abdominal pain: Secondary | ICD-10-CM

## 2014-11-11 DIAGNOSIS — D469 Myelodysplastic syndrome, unspecified: Secondary | ICD-10-CM | POA: Diagnosis not present

## 2014-11-11 DIAGNOSIS — Z96653 Presence of artificial knee joint, bilateral: Secondary | ICD-10-CM | POA: Diagnosis present

## 2014-11-11 DIAGNOSIS — R1013 Epigastric pain: Secondary | ICD-10-CM

## 2014-11-11 DIAGNOSIS — I482 Chronic atrial fibrillation: Secondary | ICD-10-CM | POA: Diagnosis present

## 2014-11-11 DIAGNOSIS — M109 Gout, unspecified: Secondary | ICD-10-CM | POA: Diagnosis not present

## 2014-11-11 DIAGNOSIS — Z9842 Cataract extraction status, left eye: Secondary | ICD-10-CM | POA: Diagnosis not present

## 2014-11-11 DIAGNOSIS — N400 Enlarged prostate without lower urinary tract symptoms: Secondary | ICD-10-CM | POA: Diagnosis present

## 2014-11-11 DIAGNOSIS — Z950 Presence of cardiac pacemaker: Secondary | ICD-10-CM | POA: Diagnosis not present

## 2014-11-11 DIAGNOSIS — K219 Gastro-esophageal reflux disease without esophagitis: Secondary | ICD-10-CM | POA: Diagnosis not present

## 2014-11-11 DIAGNOSIS — K8050 Calculus of bile duct without cholangitis or cholecystitis without obstruction: Principal | ICD-10-CM | POA: Diagnosis present

## 2014-11-11 DIAGNOSIS — R7989 Other specified abnormal findings of blood chemistry: Secondary | ICD-10-CM | POA: Diagnosis not present

## 2014-11-11 DIAGNOSIS — M1990 Unspecified osteoarthritis, unspecified site: Secondary | ICD-10-CM | POA: Diagnosis not present

## 2014-11-11 LAB — CBC WITH DIFFERENTIAL/PLATELET
BASOS ABS: 0 10*3/uL (ref 0.0–0.1)
BLASTS: 0 %
Band Neutrophils: 1 %
Basophils Relative: 0 %
Eosinophils Absolute: 0 10*3/uL (ref 0.0–0.7)
Eosinophils Relative: 0 %
HEMATOCRIT: 33.2 % — AB (ref 39.0–52.0)
Hemoglobin: 10.7 g/dL — ABNORMAL LOW (ref 13.0–17.0)
LYMPHS PCT: 17 %
Lymphs Abs: 1.3 10*3/uL (ref 0.7–4.0)
MCH: 30.7 pg (ref 26.0–34.0)
MCHC: 32.2 g/dL (ref 30.0–36.0)
MCV: 95.1 fL (ref 78.0–100.0)
MONOS PCT: 17 %
Metamyelocytes Relative: 0 %
Monocytes Absolute: 1.3 10*3/uL — ABNORMAL HIGH (ref 0.1–1.0)
Myelocytes: 0 %
NEUTROS ABS: 4.9 10*3/uL (ref 1.7–7.7)
NEUTROS PCT: 65 %
NRBC: 0 /100{WBCs}
PROMYELOCYTES ABS: 0 %
Platelets: 79 10*3/uL — ABNORMAL LOW (ref 150–400)
RBC: 3.49 MIL/uL — ABNORMAL LOW (ref 4.22–5.81)
RDW: 14.9 % (ref 11.5–15.5)
WBC: 7.5 10*3/uL (ref 4.0–10.5)

## 2014-11-11 LAB — COMPREHENSIVE METABOLIC PANEL
ALT: 399 U/L — AB (ref 17–63)
AST: 277 U/L — AB (ref 15–41)
Albumin: 4.2 g/dL (ref 3.5–5.0)
Alkaline Phosphatase: 157 U/L — ABNORMAL HIGH (ref 38–126)
Anion gap: 5 (ref 5–15)
BILIRUBIN TOTAL: 1.3 mg/dL — AB (ref 0.3–1.2)
BUN: 23 mg/dL — AB (ref 6–20)
CHLORIDE: 106 mmol/L (ref 101–111)
CO2: 26 mmol/L (ref 22–32)
CREATININE: 1.5 mg/dL — AB (ref 0.61–1.24)
Calcium: 8.9 mg/dL (ref 8.9–10.3)
GFR calc Af Amer: 47 mL/min — ABNORMAL LOW (ref >60)
GFR, EST NON AFRICAN AMERICAN: 41 mL/min — AB (ref >60)
Glucose, Bld: 122 mg/dL — ABNORMAL HIGH (ref 65–99)
Potassium: 4.3 mmol/L (ref 3.5–5.1)
Sodium: 137 mmol/L (ref 135–145)
TOTAL PROTEIN: 7.2 g/dL (ref 6.5–8.1)

## 2014-11-11 LAB — URINALYSIS, ROUTINE W REFLEX MICROSCOPIC
Bilirubin Urine: NEGATIVE
Glucose, UA: NEGATIVE mg/dL
HGB URINE DIPSTICK: NEGATIVE
Ketones, ur: NEGATIVE mg/dL
LEUKOCYTES UA: NEGATIVE
Nitrite: NEGATIVE
Protein, ur: NEGATIVE mg/dL
SPECIFIC GRAVITY, URINE: 1.02 (ref 1.005–1.030)
UROBILINOGEN UA: 0.2 mg/dL (ref 0.0–1.0)
pH: 5 (ref 5.0–8.0)

## 2014-11-11 LAB — PROTIME-INR
INR: 3.94 — ABNORMAL HIGH (ref 0.00–1.49)
Prothrombin Time: 37.6 seconds — ABNORMAL HIGH (ref 11.6–15.2)

## 2014-11-11 LAB — LIPASE, BLOOD: LIPASE: 42 U/L (ref 11–51)

## 2014-11-11 MED ORDER — HYDROMORPHONE HCL 1 MG/ML IJ SOLN: 0.5000 mg | INTRAMUSCULAR | Status: DC | PRN

## 2014-11-11 MED ORDER — SODIUM CHLORIDE 0.9 % IV SOLN
INTRAVENOUS | Status: DC
Start: 2014-11-11 — End: 2014-11-11
  Administered 2014-11-11: 16:00:00 via INTRAVENOUS

## 2014-11-11 MED ORDER — ALFUZOSIN HCL ER 10 MG PO TB24
10.0000 mg | ORAL_TABLET | Freq: Every day | ORAL | Status: DC
  Administered 2014-11-12: 10 mg via ORAL
  Filled 2014-11-11 (×2): qty 1

## 2014-11-11 MED ORDER — ALUM & MAG HYDROXIDE-SIMETH 200-200-20 MG/5ML PO SUSP
30.0000 mL | Freq: Four times a day (QID) | ORAL | Status: DC | PRN
Start: 2014-11-11 — End: 2014-11-13

## 2014-11-11 MED ORDER — HYDROMORPHONE HCL 1 MG/ML IJ SOLN
0.5000 mg | Freq: Once | INTRAMUSCULAR | Status: AC
  Administered 2014-11-11: 0.5 mg via INTRAVENOUS
  Filled 2014-11-11: qty 1

## 2014-11-11 MED ORDER — INFLUENZA VAC SPLIT QUAD 0.5 ML IM SUSY
0.5000 mL | PREFILLED_SYRINGE | INTRAMUSCULAR | Status: AC
  Administered 2014-11-12: 0.5 mL via INTRAMUSCULAR
  Filled 2014-11-11: qty 0.5

## 2014-11-11 MED ORDER — ONDANSETRON HCL 4 MG PO TABS: 4.0000 mg | ORAL_TABLET | Freq: Four times a day (QID) | ORAL | Status: DC | PRN

## 2014-11-11 MED ORDER — IOHEXOL 300 MG/ML  SOLN: 75.0000 mL | Freq: Once | INTRAMUSCULAR | Status: DC | PRN

## 2014-11-11 MED ORDER — IOHEXOL 300 MG/ML  SOLN
25.0000 mL | Freq: Once | INTRAMUSCULAR | Status: AC | PRN
Start: 2014-11-11 — End: 2014-11-11
  Administered 2014-11-11: 25 mL via ORAL

## 2014-11-11 MED ORDER — DOXAZOSIN MESYLATE 4 MG PO TABS
4.0000 mg | ORAL_TABLET | Freq: Every day | ORAL | Status: DC
  Administered 2014-11-12 – 2014-11-13 (×2): 4 mg via ORAL
  Filled 2014-11-11 (×2): qty 1

## 2014-11-11 MED ORDER — PANTOPRAZOLE SODIUM 40 MG IV SOLR
40.0000 mg | Freq: Once | INTRAVENOUS | Status: AC
  Administered 2014-11-11: 40 mg via INTRAVENOUS
  Filled 2014-11-11: qty 40

## 2014-11-11 MED ORDER — ADULT MULTIVITAMIN W/MINERALS CH
1.0000 | ORAL_TABLET | Freq: Every day | ORAL | Status: DC
  Administered 2014-11-12 – 2014-11-13 (×2): 1 via ORAL
  Filled 2014-11-11 (×2): qty 1

## 2014-11-11 MED ORDER — FINASTERIDE 5 MG PO TABS
5.0000 mg | ORAL_TABLET | Freq: Every evening | ORAL | Status: DC
  Administered 2014-11-11 – 2014-11-12 (×2): 5 mg via ORAL
  Filled 2014-11-11 (×2): qty 1

## 2014-11-11 MED ORDER — ONDANSETRON HCL 4 MG/2ML IJ SOLN: 4.0000 mg | Freq: Four times a day (QID) | INTRAMUSCULAR | Status: DC | PRN

## 2014-11-11 MED ORDER — CARVEDILOL 6.25 MG PO TABS
6.2500 mg | ORAL_TABLET | Freq: Two times a day (BID) | ORAL | Status: DC
  Administered 2014-11-11 – 2014-11-13 (×4): 6.25 mg via ORAL
  Filled 2014-11-11 (×4): qty 1

## 2014-11-11 NOTE — ED Provider Notes (Signed)
CSN: [id number]     Arrival date & time 11/11/14  2836 History   First MD Initiated Contact with Patient 11/11/14 1024     Chief Complaint  Patient presents with  . Flank Pain     (Consider location/radiation/quality/duration/timing/severity/associated sxs/prior Treatment) HPI Patient awakened with epigastric pain at 3 AM that he reports was severe. Denies that it radiated into his back. He had 2 episodes of vomiting. He did not have syncopal episode. He reports he had diarrhea earlier in the week but did not have associated abdominal pain at that time. He reports the pain did not improve after vomiting. He reports now the pain has more of a burning quality than it did initially. Patient is anticoagulated on Coumadin. Past Medical History  Diagnosis Date  . Bursitis     OF THE RIGHT SHOULDER  . Tinnitus   . Permanent atrial fibrillation (HCC)     s/p AV nodal ablation  . Depressed ejection fraction 10    EF previously 45-50%, s/p AV nodal ablation and biv pacemaker implant, previously enrolled in [facility] and was randomized to RV pacing only)  . Blood transfusion     "several; Dr. Deberah Pelton" (04/11/2014)  . GERD (gastroesophageal reflux disease)   . MDS (myelodysplastic syndrome), low grade ([place]) 02/10/2011  . Iron overload, transfusional 02/10/2011  . Complete heart block ([place]) 12/28/06    s/p AV nodal ablation and BIV pacemaker implant at [facility] by Dr Rosita Fire  . CHF (congestive heart failure) ([place])   . Chronic bronchitis ([place])     "I keep it"  . H/O hiatal hernia   . Gout   . Presence of permanent cardiac pacemaker   . Chronic anemia     RECIEVES ARANESP SHOTS IF HIS HEMOGLOBIN FALLS BELOW 11  . Bone marrow disorder     "I don't make enough RBCs"  . Arthritis     "hands, back" (04/11/2014)  . Kidney stones 1985  . Chronic kidney disease (CKD)     "they work at ~ 30%" (04/11/2014)  . Pneumonia     "high school; [employer]; three times since, last time 10/2013"  (04/11/2014)  . Gout 08/27/2014  . Preventative health care 09/09/2014    Dr Dorina Hoyer, urology, annually Dr Clover Mealy Dr Sharol Given Dr Ninfa Linden Dr Gershon Crane, opthamology Dr Rayann Heman cardiology Dr Marin Olp    Past Surgical History  Procedure Laterality Date  . Total knee arthroplasty Bilateral     right Dec.2011,left June 2012  . Joint replacement    . Pilonidal cyst excision  1970's  . Kidney stone surgery  1985    "tried to go up my penis; had to cut my stomach and go in for lodged stone"  . Av node ablation  12/08    at [facility]  . Bunionectomy Bilateral 11/2012- 01/2013    "left-right; great toes; gout and bunion OR" (02/06/2013)  . Inguinal hernia repair Right   . Lithotripsy  1985  . Cataract extraction w/ intraocular lens  implant, bilateral Bilateral   . Permanent pacemaker generator change N/A 04/11/2013    Procedure: PERMANENT PACEMAKER GENERATOR CHANGE;  Surgeon: Evans Lance, MD;  Location: [facility] CATH LAB;  Service: Cardiovascular;  Laterality: N/A;  . Bi-ventricular pacemaker insertion (crt-p)  2008  . Insert / replace / remove pacemaker  12/28/2006;     MDT CRTP implanted 2008 by Dr Rosita Fire at [facility]; gen change 03/2013 by Dr Lovena Le  . Lead revision  04/11/2014  . Kidney  stone surgery  after 1985 kidney stone OR    "put a tube in my back to collect stones"  . Lead revision N/A 04/11/2014    Procedure: LEAD REVISION;  Surgeon: [name], MD;  Location: [facility] CATH LAB;  Service: Cardiovascular;  Laterality: N/A;   Family History  Problem Relation Age of Onset  . Cancer Mother   . Melanoma Mother   . COPD Father   . Kidney disease Brother   . Heart disease Maternal Grandfather   . Heart attack Neg Hx   . Hypertension Neg Hx   . Stroke Neg Hx    Social History  Substance Use Topics  . Smoking status: Former Smoker -- 1.00 packs/day for 25 years    Types: Cigarettes    Start date: 02/23/1944    Quit date: 02/10/1968  . Smokeless tobacco: Never Used  . Alcohol Use: 0.0  oz/week    0 Standard drinks or equivalent per week     Comment: 04/11/2014 "drank a little bit from age [age]"    Review of Systems 10 Systems reviewed and are negative for acute change except as noted in the HPI.    Allergies  Methylprednisolone; Moxifloxacin hcl in nacl; and Ramipril  Home Medications   Prior to Admission medications   Medication Sig Start Date End Date Taking? Authorizing Provider  alfuzosin (UROXATRAL) 10 MG 24 hr tablet Take 10 mg by mouth daily with breakfast.   Yes Historical Provider, MD  carvedilol (COREG) 6.25 MG tablet Take 1 tablet (6.25 mg total) by mouth 2 (two) times daily with a meal. 09/19/14  Yes Darlin Coco, MD  famotidine (PEPCID) 20 MG tablet Take 40 mg by mouth 2 (two) times daily.    Yes Historical Provider, MD  finasteride (PROSCAR) 5 MG tablet Take 5 mg by mouth every evening. 5pm   Yes Historical Provider, MD  furosemide (LASIX) 40 MG tablet TAKE ONE TABLET BY MOUTH ONCE DAILY. 04/16/14  Yes [name], MD  Multiple Vitamin (MULTIVITAMIN WITH MINERALS) TABS tablet Take 1 tablet by mouth daily.   Yes Historical Provider, MD  pyridoxine (B-6) 100 MG tablet Take 100 mg by mouth 2 (two) times daily.    Yes Historical Provider, MD  warfarin (COUMADIN) 5 MG tablet TAKE ONE TABLET BY MOUTH ONCE DAILY AS DIRECTED 08/16/14  Yes [name], MD  doxazosin (CARDURA) 4 MG tablet Take 4 mg by mouth daily.    Historical Provider, MD   BP 134/66 mmHg  Pulse 70  Temp(Src) 97.9 F (36.6 C) (Oral)  Resp 20  Ht 6\' 3"  (1.905 m)  Wt 209 lb (94.802 kg)  BMI 26.12 kg/m2  SpO2 99% Physical Exam  Constitutional: He is oriented to person, place, and time. He appears well-developed and well-nourished.  HENT:  Head: Normocephalic and atraumatic.  Eyes: EOM are normal. Pupils are equal, round, and reactive to light.  Neck: Neck supple.  Cardiovascular: Normal rate, regular rhythm, normal heart sounds and intact distal pulses.   Pulmonary/Chest: Effort  normal and breath sounds normal.  Abdominal: Soft. Bowel sounds are normal. He exhibits no distension. There is tenderness.  Moderate epigastric tenderness to palpation. No guarding. Small oval bruises over the patient's abdomen most are yellowish green in color. No active hematomas.  Musculoskeletal: Normal range of motion. He exhibits no edema or tenderness.  Neurological: He is alert and oriented to person, place, and time. He has normal strength. Coordination normal. GCS eye subscore is 4. GCS verbal subscore  is 5. GCS motor subscore is 6.  Skin: Skin is warm, dry and intact. There is pallor.  Psychiatric: He has a normal mood and affect.    ED Course  Procedures (including critical care time) Labs Review Labs Reviewed  CBC WITH DIFFERENTIAL/PLATELET - Abnormal; Notable for the following:    RBC 3.49 (*)    Hemoglobin 10.7 (*)    HCT 33.2 (*)    Platelets 79 (*)    Monocytes Absolute 1.3 (*)    All other components within normal limits  COMPREHENSIVE METABOLIC PANEL - Abnormal; Notable for the following:    Glucose, Bld 122 (*)    BUN 23 (*)    Creatinine, Ser 1.50 (*)    AST 277 (*)    ALT 399 (*)    Alkaline Phosphatase 157 (*)    Total Bilirubin 1.3 (*)    GFR calc non Af Amer 41 (*)    GFR calc Af Amer 47 (*)    All other components within normal limits  PROTIME-INR - Abnormal; Notable for the following:    Prothrombin Time 37.6 (*)    INR 3.94 (*)    All other components within normal limits  LIPASE, BLOOD  URINALYSIS, ROUTINE W REFLEX MICROSCOPIC (NOT AT [facility])  HEPATITIS PANEL, ACUTE    Imaging Review Ct Abdomen Pelvis Wo Contrast  11/11/2014  CLINICAL DATA:  81 year old male with acute abdominal pain and nausea, vomiting and diarrhea for 4 days. EXAM: CT ABDOMEN AND PELVIS WITHOUT CONTRAST TECHNIQUE: Multidetector CT imaging of the abdomen and pelvis was performed following the standard protocol without IV contrast. COMPARISON:  10/22/2013 CT FINDINGS: Please  note that parenchymal abnormalities may be missed without intravenous contrast. Lower chest: Cardiomegaly and pacemaker again identified. Small pericardial effusion is unchanged. Hepatobiliary: A 3.5 x 4.8 cm hypodense mass at the junction of the right and left hepatic lobes is unchanged. Small mild gallbladder sludge versus cholelithiasis identified. There is no CT evidence of acute cholecystitis. Pancreas: Unremarkable Spleen: Upper limits normal spleen size noted. Adrenals/Urinary Tract: Bilateral renal atrophy identified. A stable 7 mm nonobstructing calculus within the left lower pole identified. There is no evidence of hydronephrosis or obstructing urinary calculus. The adrenal glands and bladder are unremarkable. Stomach/Bowel: Sigmoid colonic diverticulosis noted without diverticulitis. There is no evidence of bowel obstruction or focal bowel wall thickening. The appendix is normal. Vascular/Lymphatic: No enlarged lymph nodes identified. Aortic atherosclerotic calcifications noted without aneurysm. Reproductive: Prostate enlargement again noted. Other: No free fluid, focal collection or pneumoperitoneum. Musculoskeletal: No acute or suspicious abnormalities identified. Degenerative changes throughout the lumbar spine again noted. IMPRESSION: No evidence of acute abnormality. Cardiomegaly, small pericardial effusion, nonobstructing left lower pole renal calculus, prostatomegaly, upper limits of normal spleen size and renal atrophy again noted. Sigmoid colonic diverticulosis without diverticulitis. Electronically Signed   By: Margarette Canada M.D.   On: 11/11/2014 12:18   I have personally reviewed and evaluated these images and lab results as part of my medical decision-making.   EKG Interpretation None     Consult: Patient's case was reviewed with Dr. Henrene Pastor of gastroenterology. At this time he advises admission to the hospitalist for ongoing LFT monitoring and hydration. They will consult on the  patient. Consult: Patient's case was reviewed with the hospitalist for permission. MDM   Final diagnoses:  Choledocholithiasis   Patient presents with acute onset of biliary type pain. At this time he does not have leukocytosis and the CT does not identify acute cholecystitis. LFTs  are elevated in pain is consistent with biliary duct obstruction. Patient does have comorbid illnesses of chronic atrial fibrillation with chronic anticoagulation and chronic anemia. His underlying medical conditions appear to be well controlled at this time.    Charlesetta Shanks, MD 11/11/14 [phone]

## 2014-11-11 NOTE — Progress Notes (Signed)
11/11/2014 6:04 PM  New Admission Note:   Arrival Method: Via stretcher with [facility] RNs Mental Orientation: Alert and oriented X4 Telemetry: Placed on box 6E24 per MD orders. CCMD notified. Assessment: Completed Skin: Warm, dry and intact. Left toe abrasion.  IV: Clean, dry and intact. Normal saline lock per MD orders Pain: None stated at this time Tubes: N/A Safety Measures: Safety Fall Prevention Plan has been given, discussed. Admission: Completed [facility] Orientation: Patient has been orientated to the room, unit and staff.  Family: None at bedside. Patient stated that he would call his wife once he was settled  Orders have been reviewed and implemented. Will continue to monitor the patient. Call light has been placed within reach and bed alarm has been activated.   [employer], RN-BC, RN3 [facility]  Phone number: [phone]

## 2014-11-11 NOTE — ED Notes (Signed)
Patient transported to CT 

## 2014-11-11 NOTE — ED Notes (Signed)
Pt sent over from urgent care with 1 week history of diarrhea, nausea and ruq abdominal pain, last vomited 0600

## 2014-11-11 NOTE — ED Notes (Signed)
Pt reports acute onset of mid gastric abdominal pain described as burin ing, vomited x 2 , diarrhea on thur

## 2014-11-11 NOTE — Progress Notes (Signed)
Transfer from [facility]. Abdominal pain, vomiting. Increased LFTs. Dr. Johnney Killian d/w Dr. Henrene Pastor. Concern about choledocholithiasis, though CT abd pelvis ok. H/o a fib on coumadin. Accepted to tele bed at [facility].  Nursing staff: call flow manager on arrival to unit. She will page hospitalist to evaluate patient ad write admission orders.  Doree Barthel, MD [facility]

## 2014-11-11 NOTE — ED Notes (Signed)
Pt back from CT scan

## 2014-11-11 NOTE — H&P (Addendum)
[facility] History and Physical  SELSO MANNOR IWP:[id number] DOB: [birth year] DOA: 11/11/2014   PCP: Penni Homans, MD    Chief Complaint: Abdominal pain  HPI: [name] is a 81 y.o. male with past medical history of atrial fibrillation status post AV node ablation on Coumadin, chronic systolic heart failure with an EF of 35%, myelodysplastic syndrome, on a kidney disease stage III and gout. The patient presents for epigastric pain and nausea and vomiting. He states the pain woke him up from sleep at about 3:00 this morning. It was severe and burning in nature. It did not radiate. He had 2 episodes of vomiting today. No complaint of fever or chills. This past Thursday and Friday he had severe diarrhea but no significant abdominal pain or vomiting. Yesterday he had no GI complaints at all and was able to tolerate his food. No sick contacts at home. No blood noticed in vomitus or in stools.  ROS  General: The patient denies anorexia, fever, weight loss Cardiac: Denies chest pain, syncope, palpitations, pedal edema  Respiratory: Denies shortness of breath, wheezing, + mild cough GI: Per history of present illness GU: Denies hematuria, incontinence, dysuria  Musculoskeletal: Denies arthritis  Skin: Denies suspicious skin lesions Neurologic: Denies focal weakness or numbness, change in vision Psychiatry: Denies depression or anxiety. Hematologic: no easy bruising or bleeding  All other systems reviewed and found to be negative.  Past Medical History  Diagnosis Date  . Bursitis     OF THE RIGHT SHOULDER  . Tinnitus   . Permanent atrial fibrillation (HCC)     s/p AV nodal ablation  . Depressed ejection fraction 10    EF previously 45-50%, s/p AV nodal ablation and biv pacemaker implant, previously enrolled in [facility] and was randomized to RV pacing only)  . Blood transfusion     "several; Dr. Deberah Pelton" (04/11/2014)  . GERD (gastroesophageal reflux disease)   . MDS  (myelodysplastic syndrome), low grade ([place]) 02/10/2011  . Iron overload, transfusional 02/10/2011  . Complete heart block ([place]) 12/28/06    s/p AV nodal ablation and BIV pacemaker implant at [facility] by Dr Rosita Fire  . CHF (congestive heart failure) ([place])   . Chronic bronchitis ([place])     "I keep it"  . H/O hiatal hernia   . Gout   . Presence of permanent cardiac pacemaker   . Chronic anemia     RECIEVES ARANESP SHOTS IF HIS HEMOGLOBIN FALLS BELOW 11  . Bone marrow disorder     "I don't make enough RBCs"  . Arthritis     "hands, back" (04/11/2014)  . Kidney stones 1985  . Chronic kidney disease (CKD)     "they work at ~ 30%" (04/11/2014)  . Pneumonia     "high school; [employer]; three times since, last time 10/2013" (04/11/2014)  . Gout 08/27/2014  . Preventative health care 09/09/2014    Dr Dorina Hoyer, urology, annually Dr Clover Mealy Dr Sharol Given Dr Ninfa Linden Dr Gershon Crane, opthamology Dr Rayann Heman cardiology Dr Marin Olp     Past Surgical History  Procedure Laterality Date  . Total knee arthroplasty Bilateral     right Dec.2011,left June 2012  . Joint replacement    . Pilonidal cyst excision  1970's  . Kidney stone surgery  1985    "tried to go up my penis; had to cut my stomach and go in for lodged stone"  . Av node ablation  12/08    at [facility]  . Bunionectomy  Bilateral 11/2012- 01/2013    "left-right; great toes; gout and bunion OR" (02/06/2013)  . Inguinal hernia repair Right   . Lithotripsy  1985  . Cataract extraction w/ intraocular lens  implant, bilateral Bilateral   . Permanent pacemaker generator change N/A 04/11/2013    Procedure: PERMANENT PACEMAKER GENERATOR CHANGE;  Surgeon: Evans Lance, MD;  Location: [facility] CATH LAB;  Service: Cardiovascular;  Laterality: N/A;  . Bi-ventricular pacemaker insertion (crt-p)  2008  . Insert / replace / remove pacemaker  12/28/2006;     MDT CRTP implanted 2008 by Dr Rosita Fire at [facility]; gen change 03/2013 by Dr Lovena Le  . Lead revision   04/11/2014  . Kidney stone surgery  after 1985 kidney stone OR    "put a tube in my back to collect stones"  . Lead revision N/A 04/11/2014    Procedure: LEAD REVISION;  Surgeon: [name], MD;  Location: [facility] C[facility] LAB;  Service: Cardiovascular;  Laterality: N/A;    Social History: does not smoke or drink alcohol, no illicit drug use Lives at home with wife, retired    Allergies  Allergen Reactions  . Methylprednisolone Other (See Comments)    Dizziness, passed out twice  . Moxifloxacin Hcl In Nacl Other (See Comments) and Itching    Pt c/o being "out of his head" & itching  . Ramipril Other (See Comments)    Renal function worsened on ACEi.    Family history:   Family History  Problem Relation Age of Onset  . Cancer Mother   . Melanoma Mother   . COPD Father   . Kidney disease Brother   . Heart disease Maternal Grandfather   . Heart attack Neg Hx   . Hypertension Neg Hx   . Stroke Neg Hx       Prior to Admission medications   Medication Sig Start Date End Date Taking? Authorizing Provider  alfuzosin (UROXATRAL) 10 MG 24 hr tablet Take 10 mg by mouth daily with breakfast.   Yes Historical Provider, MD  carvedilol (COREG) 6.25 MG tablet Take 1 tablet (6.25 mg total) by mouth 2 (two) times daily with a meal. 09/19/14  Yes Darlin Coco, MD  famotidine (PEPCID) 20 MG tablet Take 40 mg by mouth 2 (two) times daily.    Yes Historical Provider, MD  finasteride (PROSCAR) 5 MG tablet Take 5 mg by mouth every evening. 5pm   Yes Historical Provider, MD  furosemide (LASIX) 40 MG tablet TAKE ONE TABLET BY MOUTH ONCE DAILY. 04/16/14  Yes [name], MD  Multiple Vitamin (MULTIVITAMIN WITH MINERALS) TABS tablet Take 1 tablet by mouth daily.   Yes Historical Provider, MD  pyridoxine (B-6) 100 MG tablet Take 100 mg by mouth 2 (two) times daily.    Yes Historical Provider, MD  warfarin (COUMADIN) 5 MG tablet TAKE ONE TABLET BY MOUTH ONCE DAILY AS DIRECTED 08/16/14  Yes [name],  MD  doxazosin (CARDURA) 4 MG tablet Take 4 mg by mouth daily.    Historical Provider, MD     Physical Exam: Filed Vitals:   11/11/14 1149 11/11/14 1437 11/11/14 1532 11/11/14 1535  BP: 134/66 132/66 125/63   Pulse: 70 70 72   Temp:   98.8 F (37.1 C)   TempSrc:   Oral   Resp: 20 20 18    Height:    6\' 3"  (1.905 m)  Weight:    93.123 kg (205 lb 4.8 oz)  SpO2: 99% 100% 98%  General: Elderly man laying in bed in no acute distress HEENT: Normocephalic and Atraumatic, Mucous membranes pink                PERRLA; EOM intact; No scleral icterus,                 Nares: Patent, Oropharynx: Clear, Fair Dentition                 Neck: FROM, no cervical lymphadenopathy, thyromegaly, carotid bruit or JVD;  Breasts: deferred CHEST WALL: No tenderness  CHEST: Normal respiration, clear to auscultation bilaterally  HEART: Regular rate and rhythm; no murmurs rubs or gallops  BACK: No kyphosis or scoliosis; no CVA tenderness  GI: Positive Bowel Sounds, soft, tender in right upper quadrant and epigastrium; no masses, no organomegaly Rectal Exam: deferred MSK: No cyanosis, clubbing, or edema Genitalia: not examined  SKIN:  no rash or ulceration  CNS: Alert and Oriented x 4, Nonfocal exam, CN 2-12 intact  Labs on Admission:  Basic Metabolic Panel:  Recent Labs Lab 11/11/14 1015  NA 137  K 4.3  CL 106  CO2 26  GLUCOSE 122*  BUN 23*  CREATININE 1.50*  CALCIUM 8.9   Liver Function Tests:  Recent Labs Lab 11/11/14 1015  AST 277*  ALT 399*  ALKPHOS 157*  BILITOT 1.3*  PROT 7.2  ALBUMIN 4.2    Recent Labs Lab 11/11/14 1015  LIPASE 42   No results for input(s): AMMONIA in the last 168 hours. CBC:  Recent Labs Lab 11/11/14 1015  WBC 7.5  NEUTROABS 4.9  HGB 10.7*  HCT 33.2*  MCV 95.1  PLT 79*   Cardiac Enzymes: No results for input(s): CKTOTAL, CKMB, CKMBINDEX, TROPONINI in the last 168 hours.  BNP (last 3 results) No results for input(s): BNP in the last  8760 hours.  ProBNP (last 3 results) No results for input(s): PROBNP in the last 8760 hours.  CBG: No results for input(s): GLUCAP in the last 168 hours.  Radiological Exams on Admission: Ct Abdomen Pelvis Wo Contrast  11/11/2014  CLINICAL DATA:  81 year old male with acute abdominal pain and nausea, vomiting and diarrhea for 4 days. EXAM: CT ABDOMEN AND PELVIS WITHOUT CONTRAST TECHNIQUE: Multidetector CT imaging of the abdomen and pelvis was performed following the standard protocol without IV contrast. COMPARISON:  10/22/2013 CT FINDINGS: Please note that parenchymal abnormalities may be missed without intravenous contrast. Lower chest: Cardiomegaly and pacemaker again identified. Small pericardial effusion is unchanged. Hepatobiliary: A 3.5 x 4.8 cm hypodense mass at the junction of the right and left hepatic lobes is unchanged. Small mild gallbladder sludge versus cholelithiasis identified. There is no CT evidence of acute cholecystitis. Pancreas: Unremarkable Spleen: Upper limits normal spleen size noted. Adrenals/Urinary Tract: Bilateral renal atrophy identified. A stable 7 mm nonobstructing calculus within the left lower pole identified. There is no evidence of hydronephrosis or obstructing urinary calculus. The adrenal glands and bladder are unremarkable. Stomach/Bowel: Sigmoid colonic diverticulosis noted without diverticulitis. There is no evidence of bowel obstruction or focal bowel wall thickening. The appendix is normal. Vascular/Lymphatic: No enlarged lymph nodes identified. Aortic atherosclerotic calcifications noted without aneurysm. Reproductive: Prostate enlargement again noted. Other: No free fluid, focal collection or pneumoperitoneum. Musculoskeletal: No acute or suspicious abnormalities identified. Degenerative changes throughout the lumbar spine again noted. IMPRESSION: No evidence of acute abnormality. Cardiomegaly, small pericardial effusion, nonobstructing left lower pole renal  calculus, prostatomegaly, upper limits of normal spleen size and renal atrophy again noted. Sigmoid colonic  diverticulosis without diverticulitis. Electronically Signed   By: Margarette Canada M.D.   On: 11/11/2014 12:18    EKG: v paced at 71 bpm  Assessment/Plan Principal Problem:   Elevated LFTs with acute onset epigastric pain nausea and vomiting -Tender in right upper quadrant as well-CT does not reveal any evidence of acute cholecystitis-there may be sludge versus stones in the gallbladder. Pancreas appears normal. -We'll obtain an abdominal ultrasound  -? Whether he passed a gallstone however,  would usually see some acute pancreatitis in relation to that this -? Viral infection-check hepatitis serology -Clear liquids only- fat-free for now -Repeat LFTs in a.m. -Will hold off on calling surgery until it is confirmed that he does have cholecystitis  Active Problems:   Atrial fibrillation with controlled ventricular response -Rate currently controlled with heart rate in the 50s -Hold Coumadin as INR elevated and also as he may need surgery -Continue carvedilol for rate control    Chronic systolic congestive heart failure -Discontinue IV fluids-appears euvolemic at this time  -hold furosemide for today and decide in the a.m. if it should be resumed (based upon volume status and whether the patient will be nothing by mouth)  Pacemaker   MDS (myelodysplastic syndrome), low grade  -Follows up with hematology as outpatient  Stage III chronic kidney disease -Appears stable   Consulted:   Code Status: Full code  Family Communication:  DVT Prophylaxis: INR therapeutic  Time spent: 50 minutes  Windie Marasco, MD [facility]  If 7PM-7AM, please contact night-coverage [email or web address] 11/11/2014, 4:52 PM

## 2014-11-12 ENCOUNTER — Encounter (HOSPITAL_COMMUNITY): Payer: Self-pay | Admitting: Gastroenterology

## 2014-11-12 DIAGNOSIS — K8050 Calculus of bile duct without cholangitis or cholecystitis without obstruction: Secondary | ICD-10-CM | POA: Diagnosis not present

## 2014-11-12 DIAGNOSIS — R7989 Other specified abnormal findings of blood chemistry: Secondary | ICD-10-CM

## 2014-11-12 LAB — CBC
HCT: 29.2 % — ABNORMAL LOW (ref 39.0–52.0)
Hemoglobin: 9.3 g/dL — ABNORMAL LOW (ref 13.0–17.0)
MCH: 29.8 pg (ref 26.0–34.0)
MCHC: 31.8 g/dL (ref 30.0–36.0)
MCV: 93.6 fL (ref 78.0–100.0)
Platelets: 76 10*3/uL — ABNORMAL LOW (ref 150–400)
RBC: 3.12 MIL/uL — ABNORMAL LOW (ref 4.22–5.81)
RDW: 15.2 % (ref 11.5–15.5)
WBC: 8.7 10*3/uL (ref 4.0–10.5)

## 2014-11-12 LAB — HEPATITIS PANEL, ACUTE
HCV Ab: <0.1 {s_co_ratio} (ref 0.0–0.9)
Hep A IgM: NEGATIVE
Hep A IgM: NEGATIVE
Hep B C IgM: NEGATIVE
Hep B C IgM: NEGATIVE
Hepatitis B Surface Ag: NEGATIVE
Hepatitis B Surface Ag: NEGATIVE

## 2014-11-12 LAB — COMPREHENSIVE METABOLIC PANEL
ALK PHOS: 123 U/L (ref 38–126)
ALT: 248 U/L — AB (ref 17–63)
AST: 113 U/L — AB (ref 15–41)
Albumin: 3 g/dL — ABNORMAL LOW (ref 3.5–5.0)
Anion gap: 5 (ref 5–15)
BUN: 18 mg/dL (ref 6–20)
CALCIUM: 8.4 mg/dL — AB (ref 8.9–10.3)
CHLORIDE: 109 mmol/L (ref 101–111)
CO2: 24 mmol/L (ref 22–32)
CREATININE: 1.45 mg/dL — AB (ref 0.61–1.24)
GFR calc Af Amer: 49 mL/min — ABNORMAL LOW (ref >60)
GFR, EST NON AFRICAN AMERICAN: 42 mL/min — AB (ref >60)
Glucose, Bld: 96 mg/dL (ref 65–99)
Potassium: 4.3 mmol/L (ref 3.5–5.1)
SODIUM: 138 mmol/L (ref 135–145)
Total Bilirubin: 2.5 mg/dL — ABNORMAL HIGH (ref 0.3–1.2)
Total Protein: 5.6 g/dL — ABNORMAL LOW (ref 6.5–8.1)

## 2014-11-12 MED ORDER — HYDROCODONE-ACETAMINOPHEN 5-325 MG PO TABS: 1.0000 | ORAL_TABLET | Freq: Four times a day (QID) | ORAL | Status: DC | PRN

## 2014-11-12 NOTE — Progress Notes (Addendum)
Patient Demographics:    [name], is a 81 y.o. male, DOB - 1927/09/04, VHQ:[id number]  Admit date - 11/11/2014   Admitting Physician Delfina Redwood, MD  Outpatient Primary MD for the patient is Penni Homans, MD  LOS - 1   Chief Complaint  Patient presents with  . Flank Pain        Subjective:    [name] today has, No headache, No chest pain, No abdominal pain - No Nausea, No new weakness tingling or numbness, No Cough - SOB.     Assessment  & Plan :     1. Epigastric pain with elevated LFTs which has now completely resolved. I think he likely passed a CBD stone, CT scan abdomen pelvis along with right upper quadrant ultrasound is unremarkable without any gallstones or CBD dilation at this time. Lipase was normal. He is symptom-free. We'll request GI to evaluate one time. Supportive care. Gently advance diet. Further recommendations per GI.   2. History of chronic atrial fibrillation [place] vascular score of 3. On Coreg and Coumadin, INR supratherapeutic Will monitor. Goal rate control.     3. Chronic systolic heart failure EF 35% on last echo less than one year ago, has AICD. Currently appears compensated, continue Coreg, CKD so no ACE/ARB, BP low Hydralazine and Imdur cannot be placed, continue to monitor clinically. Resume Lasix tomorrow. Currently on clear liquid diet.   4. CK D stage III. Baseline creatinine close to 1.6. Continue to monitor he is at baseline.    5. BPH. Continue home alpha blockers.    6. MDS. Stable no acute issues.    Code Status : Full  Family Communication  : None present  Disposition Plan  : Likely discharge in 1-2 days  Consults  :  [facility]  Procedures  : Right upper quadrant ultrasound and CT scan abdomen and pelvis with a possibly old  cavernous hemangioma in his liver, no acute findings.  DVT Prophylaxis  :  Coumadin - SCDs   Lab Results  Component Value Date   PLT 76* 11/12/2014    Inpatient Medications  Scheduled Meds: . alfuzosin  10 mg Oral Q breakfast  . carvedilol  6.25 mg Oral BID WC  . doxazosin  4 mg Oral Daily  . finasteride  5 mg Oral QPM  . Influenza vac split quadrivalent PF  0.5 mL Intramuscular Tomorrow-1000  . multivitamin with minerals  1 tablet Oral Daily   Continuous Infusions:  PRN Meds:.alum & mag hydroxide-simeth, HYDROcodone-acetaminophen, iohexol, [DISCONTINUED] ondansetron **OR** ondansetron (ZOFRAN) IV  Antibiotics  :    Anti-infectives    None        Objective:   Filed Vitals:   11/11/14 1535 11/11/14 1732 11/11/14 2105 11/12/14 0456  BP:  115/63 104/62 102/53  Pulse:  69 69 69  Temp:  99.1 F (37.3 C) 98.7 F (37.1 C) 98.7 F (37.1 C)  TempSrc:  Oral Oral Oral  Resp:  16 16 17   Height: 6\' 3"  (1.905 m)  6\' 3"  (1.905 m)   Weight: 93.123 kg (205 lb 4.8 oz)  94.031 kg (207 lb 4.8 oz)   SpO2:  99% 97% 97%    Wt Readings from Last 3 Encounters:  11/11/14  94.031 kg (207 lb 4.8 oz)  10/02/14 95.255 kg (210 lb)  09/13/14 94.348 kg (208 lb)     Intake/Output Summary (Last 24 hours) at 11/12/14 0948 Last data filed at 11/12/14 0600  Gross per 24 hour  Intake     70 ml  Output    550 ml  Net   -480 ml     Physical Exam  Awake Alert, Oriented X 3, No new F.N deficits, Normal affect [place].AT,PERRAL Supple Neck,No JVD, No cervical lymphadenopathy appriciated.  Symmetrical Chest wall movement, Good air movement bilaterally, CTAB RRR,No Gallops,Rubs or new Murmurs, No Parasternal Heave +ve B.Sounds, Abd Soft, No tenderness, No organomegaly appriciated, No rebound - guarding or rigidity. No Cyanosis, Clubbing or edema, No new Rash or bruise      Data Review:   Micro Results No results found for this or any previous visit (from the past 240 hour(s)).  Radiology  Reports Ct Abdomen Pelvis Wo Contrast  11/11/2014  CLINICAL DATA:  81 year old male with acute abdominal pain and nausea, vomiting and diarrhea for 4 days. EXAM: CT ABDOMEN AND PELVIS WITHOUT CONTRAST TECHNIQUE: Multidetector CT imaging of the abdomen and pelvis was performed following the standard protocol without IV contrast. COMPARISON:  10/22/2013 CT FINDINGS: Please note that parenchymal abnormalities may be missed without intravenous contrast. Lower chest: Cardiomegaly and pacemaker again identified. Small pericardial effusion is unchanged. Hepatobiliary: A 3.5 x 4.8 cm hypodense mass at the junction of the right and left hepatic lobes is unchanged. Small mild gallbladder sludge versus cholelithiasis identified. There is no CT evidence of acute cholecystitis. Pancreas: Unremarkable Spleen: Upper limits normal spleen size noted. Adrenals/Urinary Tract: Bilateral renal atrophy identified. A stable 7 mm nonobstructing calculus within the left lower pole identified. There is no evidence of hydronephrosis or obstructing urinary calculus. The adrenal glands and bladder are unremarkable. Stomach/Bowel: Sigmoid colonic diverticulosis noted without diverticulitis. There is no evidence of bowel obstruction or focal bowel wall thickening. The appendix is normal. Vascular/Lymphatic: No enlarged lymph nodes identified. Aortic atherosclerotic calcifications noted without aneurysm. Reproductive: Prostate enlargement again noted. Other: No free fluid, focal collection or pneumoperitoneum. Musculoskeletal: No acute or suspicious abnormalities identified. Degenerative changes throughout the lumbar spine again noted. IMPRESSION: No evidence of acute abnormality. Cardiomegaly, small pericardial effusion, nonobstructing left lower pole renal calculus, prostatomegaly, upper limits of normal spleen size and renal atrophy again noted. Sigmoid colonic diverticulosis without diverticulitis. Electronically Signed   By: Margarette Canada  M.D.   On: 11/11/2014 12:18   US Abdomen Complete  11/11/2014  CLINICAL DATA:  81 year old male with nausea and right upper quadrant abdominal pain with elevated liver function tests. EXAM: ULTRASOUND ABDOMEN COMPLETE COMPARISON:  None. FINDINGS: Gallbladder: No gallstones or wall thickening visualized. No sonographic Murphy sign noted. Common bile duct: Diameter: 5 mm in the porta hepatis. Liver: 4 cm echogenic lesion in the left lobe of the liver incompletely visualized, corresponding to known a cavernous hemangioma. Within normal limits in parenchymal echogenicity. IVC: No abnormality visualized. Pancreas: Visualized portion unremarkable. Spleen: Size and appearance within normal limits.  7.8 cm Right Kidney: Length: 9.6 cm. Echogenicity within normal limits. No mass or hydronephrosis visualized. Left Kidney: Length: 11.1 cm. Echogenicity within normal limits. No mass or hydronephrosis visualized. 8 mm shadowing focus in the lower pole, compatible with a nonobstructive calculus. Abdominal aorta: No aneurysm visualized. Other findings: None. IMPRESSION: 1. No acute findings to account for the patient's symptoms. 2. 8 mm nonobstructive calculus in the lower pole collecting system of  the left kidney. No hydronephrosis. 3. 4 cm echogenic lesion in the left lobe of the liver, similar to prior CT examinations, corresponding to known cavernous hemangioma (reference CT scan 10/22/2013). Electronically Signed   By: Vinnie Langton M.D.   On: 11/11/2014 23:48     CBC  Recent Labs Lab 11/11/14 1015 11/12/14 0433  WBC 7.5 8.7  HGB 10.7* 9.3*  HCT 33.2* 29.2*  PLT 79* 76*  MCV 95.1 93.6  MCH 30.7 29.8  MCHC 32.2 31.8  RDW 14.9 15.2  LYMPHSABS 1.3  --   MONOABS 1.3*  --   EOSABS 0.0  --   BASOSABS 0.0  --     Chemistries   Recent Labs Lab 11/11/14 1015 11/12/14 0433  NA 137 138  K 4.3 4.3  CL 106 109  CO2 26 24  GLUCOSE 122* 96  BUN 23* 18  CREATININE 1.50* 1.45*  CALCIUM 8.9 8.4*  AST  277* 113*  ALT 399* 248*  ALKPHOS 157* 123  BILITOT 1.3* 2.5*   ------------------------------------------------------------------------------------------------------------------ estimated creatinine clearance is 44.5 mL/min (by C-G formula based on Cr of 1.45). ------------------------------------------------------------------------------------------------------------------ No results for input(s): HGBA1C in the last 72 hours. ------------------------------------------------------------------------------------------------------------------ No results for input(s): CHOL, HDL, LDLCALC, TRIG, CHOLHDL, LDLDIRECT in the last 72 hours. ------------------------------------------------------------------------------------------------------------------ No results for input(s): TSH, T4TOTAL, T3FREE, THYROIDAB in the last 72 hours.  Invalid input(s): FREET3 ------------------------------------------------------------------------------------------------------------------ No results for input(s): VITAMINB12, FOLATE, FERRITIN, TIBC, IRON, RETICCTPCT in the last 72 hours.  Coagulation profile  Recent Labs Lab 11/11/14 1015  INR 3.94*    No results for input(s): DDIMER in the last 72 hours.  Cardiac Enzymes No results for input(s): CKMB, TROPONINI, MYOGLOBIN in the last 168 hours.  Invalid input(s): CK ------------------------------------------------------------------------------------------------------------------ Invalid input(s): POCBNP   Time Spent in minutes   35   SINGH,PRASHANT K M.D on 11/12/2014 at 9:48 AM  Between 7am to 7pm - Pager - [phone]  After 7pm go to [email or web address] - password [facility]  [facility] -  Office  [phone]

## 2014-11-12 NOTE — Consult Note (Signed)
Reason for Consult: Abdominal pain elevated liver tests Referring Physician: Hospital team  [name] is an 81 y.o. male.  HPI: Patient seen and examined and case discussed with him and his wife and the hospital computer chart was reviewed as was his office computer chart and he is known to me from a colonoscopy 4 years ago where he did have some small polyps and as long as he takes Metamucil every day his hemorrhoids do not seem to bother him however he has not had much upper tract symptoms until this midepigastric and right upper quadrant pain that brought him to the hospital and he was told years ago he may have had some elevated liver tests but no obvious cause was found and that had not been a problem for years and he has no other complaints and currently is feeling well  Past Medical History  Diagnosis Date  . Bursitis     OF THE RIGHT SHOULDER  . Tinnitus   . Permanent atrial fibrillation (HCC)     s/p AV nodal ablation  . Depressed ejection fraction 10    EF previously 45-50%, s/p AV nodal ablation and biv pacemaker implant, previously enrolled in [facility] and was randomized to RV pacing only)  . Blood transfusion     "several; Dr. Deberah Pelton" (04/11/2014)  . GERD (gastroesophageal reflux disease)   . MDS (myelodysplastic syndrome), low grade ([place]) 02/10/2011  . Iron overload, transfusional 02/10/2011  . Complete heart block ([place]) 12/28/06    s/p AV nodal ablation and BIV pacemaker implant at [facility] by Dr Rosita Fire  . CHF (congestive heart failure) ([place])   . Chronic bronchitis ([place])     "I keep it"  . H/O hiatal hernia   . Gout   . Presence of permanent cardiac pacemaker   . Chronic anemia     RECIEVES ARANESP SHOTS IF HIS HEMOGLOBIN FALLS BELOW 11  . Bone marrow disorder     "I don't make enough RBCs"  . Arthritis     "hands, back" (04/11/2014)  . Kidney stones 1985  . Chronic kidney disease (CKD)     "they work at ~ 30%" (04/11/2014)  . Pneumonia     "high  school; [employer]; three times since, last time 10/2013" (04/11/2014)  . Gout 08/27/2014  . Preventative health care 09/09/2014    Dr Dorina Hoyer, urology, annually Dr Clover Mealy Dr Sharol Given Dr Ninfa Linden Dr Gershon Crane, opthamology Dr Rayann Heman cardiology Dr Marin Olp     Past Surgical History  Procedure Laterality Date  . Total knee arthroplasty Bilateral     right Dec.2011,left June 2012  . Joint replacement    . Pilonidal cyst excision  1970's  . Kidney stone surgery  1985    "tried to go up my penis; had to cut my stomach and go in for lodged stone"  . Av node ablation  12/08    at [facility]  . Bunionectomy Bilateral 11/2012- 01/2013    "left-right; great toes; gout and bunion OR" (02/06/2013)  . Inguinal hernia repair Right   . Lithotripsy  1985  . Cataract extraction w/ intraocular lens  implant, bilateral Bilateral   . Permanent pacemaker generator change N/A 04/11/2013    Procedure: PERMANENT PACEMAKER GENERATOR CHANGE;  Surgeon: Evans Lance, MD;  Location: [facility] CATH LAB;  Service: Cardiovascular;  Laterality: N/A;  . Bi-ventricular pacemaker insertion (crt-p)  2008  . Insert / replace / remove pacemaker  12/28/2006;     MDT CRTP  implanted 2008 by Dr Rosita Fire at [facility]; gen change 03/2013 by Dr Lovena Le  . Lead revision  04/11/2014  . Kidney stone surgery  after 1985 kidney stone OR    "put a tube in my back to collect stones"  . Lead revision N/A 04/11/2014    Procedure: LEAD REVISION;  Surgeon: [name], MD;  Location: [facility] CATH LAB;  Service: Cardiovascular;  Laterality: N/A;    Family History  Problem Relation Age of Onset  . Cancer Mother   . Melanoma Mother   . COPD Father   . Kidney disease Brother   . Heart disease Maternal Grandfather   . Heart attack Neg Hx   . Hypertension Neg Hx   . Stroke Neg Hx     Social History:  reports that he quit smoking about 46 years ago. His smoking use included Cigarettes. He started smoking about 70 years ago. He has a 25 pack-year smoking  history. He has never used smokeless tobacco. He reports that he drinks alcohol. He reports that he does not use illicit drugs.  Allergies:  Allergies  Allergen Reactions  . Methylprednisolone Other (See Comments)    Dizziness, passed out twice  . Moxifloxacin Hcl In Nacl Other (See Comments) and Itching    Pt c/o being "out of his head" & itching  . Ramipril Other (See Comments)    Renal function worsened on ACEi.    Medications: I have reviewed the patient's current medications.  Results for orders placed or performed during the hospital encounter of 11/11/14 (from the past 48 hour(s))  CBC WITH DIFFERENTIAL     Status: Abnormal   Collection Time: 11/11/14 10:15 AM  Result Value Ref Range   WBC 7.5 4.0 - 10.5 K/uL   RBC 3.49 (L) 4.22 - 5.81 MIL/uL   Hemoglobin 10.7 (L) 13.0 - 17.0 g/dL   HCT 33.2 (L) 39.0 - 52.0 %   MCV 95.1 78.0 - 100.0 fL   MCH 30.7 26.0 - 34.0 pg   MCHC 32.2 30.0 - 36.0 g/dL   RDW 14.9 11.5 - 15.5 %   Platelets 79 (L) 150 - 400 K/uL    Comment: REPEATED TO VERIFY PLATELET COUNT CONFIRMED BY SMEAR    Neutrophils Relative % 65 %   Lymphocytes Relative 17 %   Monocytes Relative 17 %   Eosinophils Relative 0 %   Basophils Relative 0 %   Band Neutrophils 1 %   Metamyelocytes Relative 0 %   Myelocytes 0 %   Promyelocytes Absolute 0 %   Blasts 0 %   nRBC 0 0 /100 WBC   Neutro Abs 4.9 1.7 - 7.7 K/uL   Lymphs Abs 1.3 0.7 - 4.0 K/uL   Monocytes Absolute 1.3 (H) 0.1 - 1.0 K/uL   Eosinophils Absolute 0.0 0.0 - 0.7 K/uL   Basophils Absolute 0.0 0.0 - 0.1 K/uL   RBC Morphology TEARDROP CELLS     Comment: ELLIPTOCYTES  Comprehensive metabolic panel     Status: Abnormal   Collection Time: 11/11/14 10:15 AM  Result Value Ref Range   Sodium 137 135 - 145 mmol/L   Potassium 4.3 3.5 - 5.1 mmol/L   Chloride 106 101 - 111 mmol/L   CO2 26 22 - 32 mmol/L   Glucose, Bld 122 (H) 65 - 99 mg/dL   BUN 23 (H) 6 - 20 mg/dL   Creatinine, Ser 1.50 (H) 0.61 - 1.24 mg/dL    Calcium 8.9 8.9 - 10.3 mg/dL   Total  Protein 7.2 6.5 - 8.1 g/dL   Albumin 4.2 3.5 - 5.0 g/dL   AST 277 (H) 15 - 41 U/L   ALT 399 (H) 17 - 63 U/L   Alkaline Phosphatase 157 (H) 38 - 126 U/L   Total Bilirubin 1.3 (H) 0.3 - 1.2 mg/dL   GFR calc non Af Amer 41 (L) >60 mL/min   GFR calc Af Amer 47 (L) >60 mL/min    Comment: (NOTE) The eGFR has been calculated using the CKD EPI equation. This calculation has not been validated in all clinical situations. eGFR's persistently <60 mL/min signify possible Chronic Kidney Disease.    Anion gap 5 5 - 15  Lipase, blood     Status: None   Collection Time: 11/11/14 10:15 AM  Result Value Ref Range   Lipase 42 11 - 51 U/L    Comment: Please note change in reference range.  Protime-INR     Status: Abnormal   Collection Time: 11/11/14 10:15 AM  Result Value Ref Range   Prothrombin Time 37.6 (H) 11.6 - 15.2 seconds   INR 3.94 (H) 0.00 - 1.49  Urinalysis, Routine w reflex microscopic     Status: None   Collection Time: 11/11/14  1:20 PM  Result Value Ref Range   Color, Urine YELLOW YELLOW   APPearance CLEAR CLEAR   Specific Gravity, Urine 1.020 1.005 - 1.030   pH 5.0 5.0 - 8.0   Glucose, UA NEGATIVE NEGATIVE mg/dL   Hgb urine dipstick NEGATIVE NEGATIVE   Bilirubin Urine NEGATIVE NEGATIVE   Ketones, ur NEGATIVE NEGATIVE mg/dL   Protein, ur NEGATIVE NEGATIVE mg/dL   Urobilinogen, UA 0.2 0.0 - 1.0 mg/dL   Nitrite NEGATIVE NEGATIVE   Leukocytes, UA NEGATIVE NEGATIVE    Comment: MICROSCOPIC NOT DONE ON URINES WITH NEGATIVE PROTEIN, BLOOD, LEUKOCYTES, NITRITE, OR GLUCOSE <1000 mg/dL.  Comprehensive metabolic panel     Status: Abnormal   Collection Time: 11/12/14  4:33 AM  Result Value Ref Range   Sodium 138 135 - 145 mmol/L   Potassium 4.3 3.5 - 5.1 mmol/L   Chloride 109 101 - 111 mmol/L   CO2 24 22 - 32 mmol/L   Glucose, Bld 96 65 - 99 mg/dL   BUN 18 6 - 20 mg/dL   Creatinine, Ser 1.45 (H) 0.61 - 1.24 mg/dL   Calcium 8.4 (L) 8.9 - 10.3  mg/dL   Total Protein 5.6 (L) 6.5 - 8.1 g/dL   Albumin 3.0 (L) 3.5 - 5.0 g/dL   AST 113 (H) 15 - 41 U/L   ALT 248 (H) 17 - 63 U/L   Alkaline Phosphatase 123 38 - 126 U/L   Total Bilirubin 2.5 (H) 0.3 - 1.2 mg/dL   GFR calc non Af Amer 42 (L) >60 mL/min   GFR calc Af Amer 49 (L) >60 mL/min    Comment: (NOTE) The eGFR has been calculated using the CKD EPI equation. This calculation has not been validated in all clinical situations. eGFR's persistently <60 mL/min signify possible Chronic Kidney Disease.    Anion gap 5 5 - 15  CBC     Status: Abnormal   Collection Time: 11/12/14  4:33 AM  Result Value Ref Range   WBC 8.7 4.0 - 10.5 K/uL   RBC 3.12 (L) 4.22 - 5.81 MIL/uL   Hemoglobin 9.3 (L) 13.0 - 17.0 g/dL   HCT 29.2 (L) 39.0 - 52.0 %   MCV 93.6 78.0 - 100.0 fL   MCH 29.8 26.0 -  34.0 pg   MCHC 31.8 30.0 - 36.0 g/dL   RDW 15.2 11.5 - 15.5 %   Platelets 76 (L) 150 - 400 K/uL    Comment: PLATELET COUNT CONFIRMED BY SMEAR    Ct Abdomen Pelvis Wo Contrast  11/11/2014  CLINICAL DATA:  81 year old male with acute abdominal pain and nausea, vomiting and diarrhea for 4 days. EXAM: CT ABDOMEN AND PELVIS WITHOUT CONTRAST TECHNIQUE: Multidetector CT imaging of the abdomen and pelvis was performed following the standard protocol without IV contrast. COMPARISON:  10/22/2013 CT FINDINGS: Please note that parenchymal abnormalities may be missed without intravenous contrast. Lower chest: Cardiomegaly and pacemaker again identified. Small pericardial effusion is unchanged. Hepatobiliary: A 3.5 x 4.8 cm hypodense mass at the junction of the right and left hepatic lobes is unchanged. Small mild gallbladder sludge versus cholelithiasis identified. There is no CT evidence of acute cholecystitis. Pancreas: Unremarkable Spleen: Upper limits normal spleen size noted. Adrenals/Urinary Tract: Bilateral renal atrophy identified. A stable 7 mm nonobstructing calculus within the left lower pole identified. There is  no evidence of hydronephrosis or obstructing urinary calculus. The adrenal glands and bladder are unremarkable. Stomach/Bowel: Sigmoid colonic diverticulosis noted without diverticulitis. There is no evidence of bowel obstruction or focal bowel wall thickening. The appendix is normal. Vascular/Lymphatic: No enlarged lymph nodes identified. Aortic atherosclerotic calcifications noted without aneurysm. Reproductive: Prostate enlargement again noted. Other: No free fluid, focal collection or pneumoperitoneum. Musculoskeletal: No acute or suspicious abnormalities identified. Degenerative changes throughout the lumbar spine again noted. IMPRESSION: No evidence of acute abnormality. Cardiomegaly, small pericardial effusion, nonobstructing left lower pole renal calculus, prostatomegaly, upper limits of normal spleen size and renal atrophy again noted. Sigmoid colonic diverticulosis without diverticulitis. Electronically Signed   By: Margarette Canada M.D.   On: 11/11/2014 12:18   US Abdomen Complete  11/11/2014  CLINICAL DATA:  81 year old male with nausea and right upper quadrant abdominal pain with elevated liver function tests. EXAM: ULTRASOUND ABDOMEN COMPLETE COMPARISON:  None. FINDINGS: Gallbladder: No gallstones or wall thickening visualized. No sonographic Murphy sign noted. Common bile duct: Diameter: 5 mm in the porta hepatis. Liver: 4 cm echogenic lesion in the left lobe of the liver incompletely visualized, corresponding to known a cavernous hemangioma. Within normal limits in parenchymal echogenicity. IVC: No abnormality visualized. Pancreas: Visualized portion unremarkable. Spleen: Size and appearance within normal limits.  7.8 cm Right Kidney: Length: 9.6 cm. Echogenicity within normal limits. No mass or hydronephrosis visualized. Left Kidney: Length: 11.1 cm. Echogenicity within normal limits. No mass or hydronephrosis visualized. 8 mm shadowing focus in the lower pole, compatible with a nonobstructive  calculus. Abdominal aorta: No aneurysm visualized. Other findings: None. IMPRESSION: 1. No acute findings to account for the patient's symptoms. 2. 8 mm nonobstructive calculus in the lower pole collecting system of the left kidney. No hydronephrosis. 3. 4 cm echogenic lesion in the left lobe of the liver, similar to prior CT examinations, corresponding to known cavernous hemangioma (reference CT scan 10/22/2013). Electronically Signed   By: Vinnie Langton M.D.   On: 11/11/2014 23:48    ROS negative except above Blood pressure 111/50, pulse 71, temperature 98.6 F (37 C), temperature source Oral, resp. rate 18, height _0  (1.905 m), weight 94.031 kg (207 lb 4.8 oz), SpO2 98 %. Physical Exam vital signs stable afebrile no acute distress lungs are clear heart regular rate and rhythm abdomen is soft nontender cannot reduplicate pain labs CT and ultrasound reviewed  Assessment/Plan: Probable CBD stones or passing sludge  which I discussed with he and his wife Plan: He says he is not allowed to get in the MRI scanner because of his pacemaker and I would confirm that because we could do an MRCP if he was allowed in the MRI scanner but if not I discussed endoscopic ultrasound with him including the risks benefits and methods and we could probably proceed with him on Coumadin however if positive would need to proceed with an ERCP and that procedure was discussed with he and his wife as well including the risks and we would need to get his cardiologist Dr. Mare Ferrari okay to hold the Coumadin for a while and make sure he did not need any additional blood thinners and we can do both of the above as an outpatient as long as he eats fine today and liver tests are decreasing tomorrow and will be happy to set up as an outpatient his EUS with my partner Dr. Paulita Fujita when convenient  [facility] 11/12/2014, 12:32 PM

## 2014-11-13 LAB — CBC
HCT: 30 % — ABNORMAL LOW (ref 39.0–52.0)
Hemoglobin: 9.8 g/dL — ABNORMAL LOW (ref 13.0–17.0)
MCH: 30.7 pg (ref 26.0–34.0)
MCHC: 32.7 g/dL (ref 30.0–36.0)
MCV: 94 fL (ref 78.0–100.0)
PLATELETS: 81 10*3/uL — AB (ref 150–400)
RBC: 3.19 MIL/uL — AB (ref 4.22–5.81)
RDW: 15.3 % (ref 11.5–15.5)
WBC: 6.7 10*3/uL (ref 4.0–10.5)

## 2014-11-13 LAB — COMPREHENSIVE METABOLIC PANEL
ALBUMIN: 3.1 g/dL — AB (ref 3.5–5.0)
ALK PHOS: 118 U/L (ref 38–126)
ALT: 173 U/L — ABNORMAL HIGH (ref 17–63)
ANION GAP: 4 — AB (ref 5–15)
AST: 55 U/L — AB (ref 15–41)
BILIRUBIN TOTAL: 1.4 mg/dL — AB (ref 0.3–1.2)
BUN: 16 mg/dL (ref 6–20)
CHLORIDE: 110 mmol/L (ref 101–111)
CO2: 26 mmol/L (ref 22–32)
CREATININE: 1.41 mg/dL — AB (ref 0.61–1.24)
Calcium: 8.8 mg/dL — ABNORMAL LOW (ref 8.9–10.3)
GFR calc Af Amer: 51 mL/min — ABNORMAL LOW (ref >60)
GFR calc non Af Amer: 44 mL/min — ABNORMAL LOW (ref >60)
Glucose, Bld: 94 mg/dL (ref 65–99)
POTASSIUM: 4.2 mmol/L (ref 3.5–5.1)
SODIUM: 140 mmol/L (ref 135–145)
TOTAL PROTEIN: 6 g/dL — AB (ref 6.5–8.1)

## 2014-11-13 LAB — PROTIME-INR
INR: 3.06 — AB (ref 0.00–1.49)
Prothrombin Time: 31 seconds — ABNORMAL HIGH (ref 11.6–15.2)

## 2014-11-13 MED ORDER — WARFARIN SODIUM 5 MG PO TABS: ORAL_TABLET | ORAL | Status: DC

## 2014-11-13 NOTE — Discharge Summary (Signed)
[name], is a 81 y.o. male  DOB [birth year]  MRN [id number].  Admission date:  11/11/2014  Admitting Physician  Delfina Redwood, MD  Discharge Date:  11/13/2014   Primary MD  Penni Homans, MD  Recommendations for primary care physician for things to follow:   Check INR, CBC CMP in 2-3 days. Needs outpatient GI and cardiology follow-up.   Admission Diagnosis  Choledocholithiasis [K80.50] Epigastric pain [R10.13]   Discharge Diagnosis  Choledocholithiasis [K80.50] Epigastric pain [R10.13]     Principal Problem:   Elevated LFTs Active Problems:   Atrial fibrillation with controlled ventricular response (HCC)   Chronic systolic congestive heart failure (HCC)   MDS (myelodysplastic syndrome), low grade (HCC)   Stage III chronic kidney disease   Chronic systolic heart failure (HCC)   Abdominal pain, epigastric      Past Medical History  Diagnosis Date  . Bursitis     OF THE RIGHT SHOULDER  . Tinnitus   . Permanent atrial fibrillation (HCC)     s/p AV nodal ablation  . Depressed ejection fraction 10    EF previously 45-50%, s/p AV nodal ablation and biv pacemaker implant, previously enrolled in Block HF and was randomized to RV pacing only)  . Blood transfusion     "several; Dr. Deberah Pelton" (04/11/2014)  . GERD (gastroesophageal reflux disease)   . MDS (myelodysplastic syndrome), low grade ([place]) 02/10/2011  . Iron overload, transfusional 02/10/2011  . Complete heart block ([place]) 12/28/06    s/p AV nodal ablation and BIV pacemaker implant at [facility] by Dr Rosita Fire  . CHF (congestive heart failure) ([place])   . Chronic bronchitis ([place])     "I keep it"  . H/O hiatal hernia   . Gout   . Presence of permanent cardiac pacemaker   . Chronic anemia     RECIEVES ARANESP SHOTS IF HIS HEMOGLOBIN  FALLS BELOW 11  . Bone marrow disorder     "I don't make enough RBCs"  . Arthritis     "hands, back" (04/11/2014)  . Kidney stones 1985  . Chronic kidney disease (CKD)     "they work at ~ 30%" (04/11/2014)  . Pneumonia     "high school; [employer]; three times since, last time 10/2013" (04/11/2014)  . Gout 08/27/2014  . Preventative health care 09/09/2014    Dr Dorina Hoyer, urology, annually Dr Clover Mealy Dr Sharol Given Dr Ninfa Linden Dr Gershon Crane, opthamology Dr Rayann Heman cardiology Dr Marin Olp     Past Surgical History  Procedure Laterality Date  . Total knee arthroplasty Bilateral     right Dec.2011,left June 2012  . Joint replacement    . Pilonidal cyst excision  1970's  . Kidney stone surgery  1985    "tried to go up my penis; had to cut my stomach and go in for lodged stone"  . Av node ablation  12/08    at [facility]  . Bunionectomy Bilateral 11/2012- 01/2013    "left-right; great toes; gout and bunion OR" (02/06/2013)  .  Inguinal hernia repair Right   . Lithotripsy  1985  . Cataract extraction w/ intraocular lens  implant, bilateral Bilateral   . Permanent pacemaker generator change N/A 04/11/2013    Procedure: PERMANENT PACEMAKER GENERATOR CHANGE;  Surgeon: Evans Lance, MD;  Location: [facility] CATH LAB;  Service: Cardiovascular;  Laterality: N/A;  . Bi-ventricular pacemaker insertion (crt-p)  2008  . Insert / replace / remove pacemaker  12/28/2006;     MDT CRTP implanted 2008 by Dr Rosita Fire at [facility]; gen change 03/2013 by Dr Lovena Le  . Lead revision  04/11/2014  . Kidney stone surgery  after 1985 kidney stone OR    "put a tube in my back to collect stones"  . Lead revision N/A 04/11/2014    Procedure: LEAD REVISION;  Surgeon: [name], MD;  Location: [facility] CATH LAB;  Service: Cardiovascular;  Laterality: N/A;       HPI  from the history and physical done on the day of admission:   [name] is a 81 y.o. male with past medical history of atrial fibrillation status post AV node ablation  on Coumadin, chronic systolic heart failure with an EF of 35%, myelodysplastic syndrome, on a kidney disease stage III and gout. The patient presents for epigastric pain and nausea and vomiting. He states the pain woke him up from sleep at about 3:00 this morning. It was severe and burning in nature. It did not radiate. He had 2 episodes of vomiting today. No complaint of fever or chills. This past Thursday and Friday he had severe diarrhea but no significant abdominal pain or vomiting. Yesterday he had no GI complaints at all and was able to tolerate his food. No sick contacts at home. No blood noticed in vomitus or in stools.     Hospital Course:     1. Epigastric pain with elevated LFTs which has now completely resolved. I think he likely passed a CBD stone, CT scan abdomen pelvis along with right upper quadrant ultrasound is unremarkable without any gallstones or CBD dilation at this time. Lipase was normal. He is symptom-free and tolerating diet. He was seen by GI who requested outpatient MRCP/ERCP. This will be done in conjunction with patient's cardiologist Dr. Mare Ferrari if Coumadin needs to be held.   2. History of chronic atrial fibrillation [place] vascular score of 3. On Coreg and Coumadin, INR supratherapeutic Will monitor. Goal rate control.    3. Chronic systolic heart failure EF 35% on last echo less than one year ago, has AICD. Currently appears compensated, continue Coreg, CKD so no ACE/ARB, BP low Hydralazine and Imdur cannot be placed, continue to monitor clinically. Resume Lasix.   4. CK D stage III. Baseline creatinine close to 1.6. Close to baseline.    5. BPH. Continue home alpha blockers.    6. MDS. Stable no acute issues.       Discharge Condition: Stable  Follow UP  Follow-up Information    Follow up with Penni Homans, MD. Schedule an appointment as soon as possible for a visit in 3 days.   Specialty:  Family Medicine   Contact information:   [address] [place] [address] [phone]       Follow up with Landry Dyke, MD. Schedule an appointment as soon as possible for a visit in 1 week.   Specialty:  Gastroenterology   Contact information:   [address] [place] [address] [phone]  Follow up with Warren Danes, MD. Schedule an appointment as soon as possible for a visit in 1 week.   Specialty:  Cardiology   Contact information:   [address] [place] [address] [phone]        Consults obtained - GI  Diet and Activity recommendation: See Discharge Instructions below  Discharge Instructions       Discharge Instructions    Discharge instructions    Complete by:  As directed   Follow with Primary MD Penni Homans, MD in 2-3 days   Get CBC, CMP, INR, 2 view Chest X ray checked  by Primary MD next visit.    Activity: As tolerated with Full fall precautions use walker/cane & assistance as needed   Disposition Home     Diet: Heart Healthy    For Heart failure patients - Check your Weight same time everyday, if you gain over 2 pounds, or you develop in leg swelling, experience more shortness of breath or chest pain, call your Primary MD immediately. Follow Cardiac Low Salt Diet and 1.5 lit/day fluid restriction.   On your next visit with your primary care physician please Get Medicines reviewed and adjusted.   Please request your Prim.MD to go over all Hospital Tests and Procedure/Radiological results at the follow up, please get all Hospital records sent to your Prim MD by signing hospital release before you go home.   If you experience worsening of your admission symptoms, develop shortness of breath, life threatening emergency, suicidal or homicidal thoughts you must seek medical attention immediately by calling 911 or calling your MD immediately  if symptoms less severe.  You Must read complete instructions/literature along with all  the possible adverse reactions/side effects for all the Medicines you take and that have been prescribed to you. Take any new Medicines after you have completely understood and accpet all the possible adverse reactions/side effects.   Do not drive, operating heavy machinery, perform activities at heights, swimming or participation in water activities or provide baby sitting services if your were admitted for syncope or siezures until you have seen by Primary MD or a Neurologist and advised to do so again.  Do not drive when taking Pain medications.    Do not take more than prescribed Pain, Sleep and Anxiety Medications  Special Instructions: If you have smoked or chewed Tobacco  in the last 2 yrs please stop smoking, stop any regular Alcohol  and or any Recreational drug use.  Wear Seat belts while driving.   Please note  You were cared for by a hospitalist during your hospital stay. If you have any questions about your discharge medications or the care you received while you were in the hospital after you are discharged, you can call the unit and asked to speak with the hospitalist on call if the hospitalist that took care of you is not available. Once you are discharged, your primary care physician will handle any further medical issues. Please note that NO REFILLS for any discharge medications will be authorized once you are discharged, as it is imperative that you return to your primary care physician (or establish a relationship with a primary care physician if you do not have one) for your aftercare needs so that they can reassess your need for medications and monitor your lab values.     Increase activity slowly    Complete by:  As directed  Discharge Medications       Medication List    TAKE these medications        allopurinol 300 MG tablet  Commonly known as:  ZYLOPRIM  Take 300 mg by mouth daily.     carvedilol 6.25 MG tablet  Commonly known as:  COREG    Take 1 tablet (6.25 mg total) by mouth 2 (two) times daily with a meal.     doxazosin 4 MG tablet  Commonly known as:  CARDURA  Take 4 mg by mouth daily.     famotidine 40 MG tablet  Commonly known as:  PEPCID  Take 40 mg by mouth 2 (two) times daily.     finasteride 5 MG tablet  Commonly known as:  PROSCAR  Take 5 mg by mouth every evening. 5pm     furosemide 40 MG tablet  Commonly known as:  LASIX  TAKE ONE TABLET BY MOUTH ONCE DAILY.     multivitamin with minerals Tabs tablet  Take 1 tablet by mouth daily.     pyridoxine 100 MG tablet  Commonly known as:  B-6  Take 100 mg by mouth 2 (two) times daily.     warfarin 5 MG tablet  Commonly known as:  COUMADIN  TAKE ONE TABLET BY MOUTH ONCE DAILY AT SUPPER EXCEPT ON SUNDAY  Start taking on:  11/14/2014        Major procedures and Radiology Reports - PLEASE review detailed and final reports for all details, in brief -       Ct Abdomen Pelvis Wo Contrast  11/11/2014  CLINICAL DATA:  81 year old male with acute abdominal pain and nausea, vomiting and diarrhea for 4 days. EXAM: CT ABDOMEN AND PELVIS WITHOUT CONTRAST TECHNIQUE: Multidetector CT imaging of the abdomen and pelvis was performed following the standard protocol without IV contrast. COMPARISON:  10/22/2013 CT FINDINGS: Please note that parenchymal abnormalities may be missed without intravenous contrast. Lower chest: Cardiomegaly and pacemaker again identified. Small pericardial effusion is unchanged. Hepatobiliary: A 3.5 x 4.8 cm hypodense mass at the junction of the right and left hepatic lobes is unchanged. Small mild gallbladder sludge versus cholelithiasis identified. There is no CT evidence of acute cholecystitis. Pancreas: Unremarkable Spleen: Upper limits normal spleen size noted. Adrenals/Urinary Tract: Bilateral renal atrophy identified. A stable 7 mm nonobstructing calculus within the left lower pole identified. There is no evidence of hydronephrosis or  obstructing urinary calculus. The adrenal glands and bladder are unremarkable. Stomach/Bowel: Sigmoid colonic diverticulosis noted without diverticulitis. There is no evidence of bowel obstruction or focal bowel wall thickening. The appendix is normal. Vascular/Lymphatic: No enlarged lymph nodes identified. Aortic atherosclerotic calcifications noted without aneurysm. Reproductive: Prostate enlargement again noted. Other: No free fluid, focal collection or pneumoperitoneum. Musculoskeletal: No acute or suspicious abnormalities identified. Degenerative changes throughout the lumbar spine again noted. IMPRESSION: No evidence of acute abnormality. Cardiomegaly, small pericardial effusion, nonobstructing left lower pole renal calculus, prostatomegaly, upper limits of normal spleen size and renal atrophy again noted. Sigmoid colonic diverticulosis without diverticulitis. Electronically Signed   By: Margarette Canada M.D.   On: 11/11/2014 12:18   US Abdomen Complete  11/11/2014  CLINICAL DATA:  81 year old male with nausea and right upper quadrant abdominal pain with elevated liver function tests. EXAM: ULTRASOUND ABDOMEN COMPLETE COMPARISON:  None. FINDINGS: Gallbladder: No gallstones or wall thickening visualized. No sonographic Murphy sign noted. Common bile duct: Diameter: 5 mm in the porta hepatis. Liver: 4 cm echogenic lesion in the left lobe of  the liver incompletely visualized, corresponding to known a cavernous hemangioma. Within normal limits in parenchymal echogenicity. IVC: No abnormality visualized. Pancreas: Visualized portion unremarkable. Spleen: Size and appearance within normal limits.  7.8 cm Right Kidney: Length: 9.6 cm. Echogenicity within normal limits. No mass or hydronephrosis visualized. Left Kidney: Length: 11.1 cm. Echogenicity within normal limits. No mass or hydronephrosis visualized. 8 mm shadowing focus in the lower pole, compatible with a nonobstructive calculus. Abdominal aorta: No aneurysm  visualized. Other findings: None. IMPRESSION: 1. No acute findings to account for the patient's symptoms. 2. 8 mm nonobstructive calculus in the lower pole collecting system of the left kidney. No hydronephrosis. 3. 4 cm echogenic lesion in the left lobe of the liver, similar to prior CT examinations, corresponding to known cavernous hemangioma (reference CT scan 10/22/2013). Electronically Signed   By: Vinnie Langton M.D.   On: 11/11/2014 23:48    Micro Results      No results found for this or any previous visit (from the past 240 hour(s)).     Today   Subjective    [name] today has no headache,no chest abdominal pain,no new weakness tingling or numbness, feels much better wants to go home today.     Objective   Blood pressure 119/61, pulse 77, temperature 97.5 F (36.4 C), temperature source Oral, resp. rate 19, height 6\' 3"  (1.905 m), weight 94.6 kg (208 lb 8.9 oz), SpO2 99 %.   Intake/Output Summary (Last 24 hours) at 11/13/14 1017 Last data filed at 11/13/14 0828  Gross per 24 hour  Intake   1080 ml  Output      0 ml  Net   1080 ml    Exam Awake Alert, Oriented x 3, No new F.N deficits, Normal affect .AT,PERRAL Supple Neck,No JVD, No cervical lymphadenopathy appriciated.  Symmetrical Chest wall movement, Good air movement bilaterally, CTAB RRR,No Gallops,Rubs or new Murmurs, No Parasternal Heave +ve B.Sounds, Abd Soft, Non tender, No organomegaly appriciated, No rebound -guarding or rigidity. No Cyanosis, Clubbing or edema, No new Rash or bruise   Data Review   CBC w Diff: Lab Results  Component Value Date   WBC 6.7 11/13/2014   WBC 8.0 10/02/2014   WBC 5.4 07/04/2007   HGB 9.8* 11/13/2014   HGB 10.3* 10/02/2014   HGB 10.7* 07/04/2007   HCT 30.0* 11/13/2014   HCT 31.2* 10/02/2014   HCT 30.3* 07/04/2007   PLT 81* 11/13/2014   PLT 94* 10/02/2014   PLT 125* 07/04/2007   LYMPHOPCT 17 11/11/2014   LYMPHOPCT 32.6 05/28/2010   LYMPHOPCT 40.4  07/04/2007   BANDSPCT 1 11/11/2014   MONOPCT 17 11/11/2014   MONOPCT 25.5* 05/28/2010   MONOPCT 10.5 07/04/2007   EOSPCT 0 11/11/2014   EOSPCT 1.4 05/28/2010   EOSPCT 2.3 07/04/2007   BASOPCT 0 11/11/2014   BASOPCT 3.8* 05/28/2010   BASOPCT 0.4 07/04/2007    CMP: Lab Results  Component Value Date   NA 140 11/13/2014   NA 146* 05/07/2014   K 4.2 11/13/2014   K 4.0 05/07/2014   CL 110 11/13/2014   CL 106 05/07/2014   CO2 26 11/13/2014   CO2 29 05/07/2014   BUN 16 11/13/2014   BUN 21 05/07/2014   CREATININE 1.41* 11/13/2014   CREATININE 1.7* 05/07/2014   PROT 6.0* 11/13/2014   PROT 7.4 05/07/2014   ALBUMIN 3.1* 11/13/2014   ALBUMIN 3.9 05/07/2014   BILITOT 1.4* 11/13/2014   BILITOT 1.40 05/07/2014   ALKPHOS 118 11/13/2014  ALKPHOS 76 05/07/2014   AST 55* 11/13/2014   AST 27 05/07/2014   ALT 173* 11/13/2014   ALT 19 05/07/2014  .  Lab Results  Component Value Date   INR 3.06* 11/13/2014   INR 3.94* 11/11/2014   INR 3.3 10/02/2014   PROTIME 39.6* 10/02/2014   PROTIME 40.8* 07/02/2014   PROTIME 34.8* 05/07/2014    Total Time in preparing paper work, data evaluation and todays exam - 35 minutes  Thurnell Lose M.D on 11/13/2014 at 10:17 AM  [facility]   Office  [phone]

## 2014-11-13 NOTE — Progress Notes (Signed)
Utilization review completed. Saleh Ulbrich, RN, BSN. 

## 2014-11-13 NOTE — Progress Notes (Signed)
Vladimir Faster to be D/C'd Home per MD order.  Discussed prescriptions and follow up appointments with the patient. Prescriptions given to patient, medication list explained in detail. Pt verbalized understanding.    Medication List    TAKE these medications        allopurinol 300 MG tablet  Commonly known as:  ZYLOPRIM  Take 300 mg by mouth daily.     carvedilol 6.25 MG tablet  Commonly known as:  COREG  Take 1 tablet (6.25 mg total) by mouth 2 (two) times daily with a meal.     doxazosin 4 MG tablet  Commonly known as:  CARDURA  Take 4 mg by mouth daily.     famotidine 40 MG tablet  Commonly known as:  PEPCID  Take 40 mg by mouth 2 (two) times daily.     finasteride 5 MG tablet  Commonly known as:  PROSCAR  Take 5 mg by mouth every evening. 5pm     furosemide 40 MG tablet  Commonly known as:  LASIX  TAKE ONE TABLET BY MOUTH ONCE DAILY.     multivitamin with minerals Tabs tablet  Take 1 tablet by mouth daily.     pyridoxine 100 MG tablet  Commonly known as:  B-6  Take 100 mg by mouth 2 (two) times daily.     warfarin 5 MG tablet  Commonly known as:  COUMADIN  TAKE ONE TABLET BY MOUTH ONCE DAILY AT SUPPER EXCEPT ON SUNDAY  Start taking on:  11/14/2014        Filed Vitals:   11/13/14 0827  BP: 119/61  Pulse: 77  Temp: 97.5 F (36.4 C)  Resp: 19    Skin clean, dry and intact without evidence of skin break down, no evidence of skin tears noted. IV catheter discontinued intact. Site without signs and symptoms of complications. Dressing and pressure applied. Pt denies pain at this time. No complaints noted.  An After Visit Summary was printed and given to the patient. Patient escorted via [place], and D/C home via private auto.  Anakin Varkey A 11/13/2014 11:15 AM

## 2014-11-13 NOTE — Care Management Note (Signed)
Case Management Note  Patient Details  Name: [name] MRN: [id number] Date of Birth: 27-Sep-1927  Subjective/Objective:         CM following for progression and d/c planning.           Action/Plan: No d/c needs identified.   Expected Discharge Date:       11/13/2014           Expected Discharge Plan:  Home/Self Care  In-House Referral:  NA  Discharge planning Services  NA  Post Acute Care Choice:  NA Choice offered to:  NA  DME Arranged:  N/A DME Agency:  NA  HH Arranged:  NA HH Agency:  NA  Status of Service:  Completed, signed off  Medicare Important Message Given:    Date Medicare IM Given:    Medicare IM give by:    Date Additional Medicare IM Given:    Additional Medicare Important Message give by:     If discussed at [place] of Stay Meetings, dates discussed:    Additional Comments:  Adron Bene, RN 11/13/2014, 12:15 PM

## 2014-11-13 NOTE — Progress Notes (Signed)
Tarrence W Poehlman 9:51 AM  Subjective: Patient without any GI complaints and eating fine and case discussed with the hospital team  Objective: Vital signs stable afebrile no acute distress abdomen is soft nontender LFTs decreased  Assessment: Probably passed CBD stone  Plan: Okay with me to go home call us when necessary I have set him up for a endoscopic ultrasound with my partner Dr. Paulita Fujita next Wednesday at [facility] and will repeat liver tests that day and if positive we will proceed with an ERCP the following week or later that week and will ask his cardiologist Dr. Mare Ferrari about his Coumadin holding for above  [facility]  Pager [phone] After 5PM or if no answer call [phone]

## 2014-11-13 NOTE — Discharge Instructions (Signed)
Follow with Primary MD Penni Homans, MD in 2-3 days   Get CBC, CMP, INR, 2 view Chest X ray checked  by Primary MD next visit.    Activity: As tolerated with Full fall precautions use walker/cane & assistance as needed   Disposition Home     Diet: Heart Healthy    For Heart failure patients - Check your Weight same time everyday, if you gain over 2 pounds, or you develop in leg swelling, experience more shortness of breath or chest pain, call your Primary MD immediately. Follow Cardiac Low Salt Diet and 1.5 lit/day fluid restriction.   On your next visit with your primary care physician please Get Medicines reviewed and adjusted.   Please request your Prim.MD to go over all Hospital Tests and Procedure/Radiological results at the follow up, please get all Hospital records sent to your Prim MD by signing hospital release before you go home.   If you experience worsening of your admission symptoms, develop shortness of breath, life threatening emergency, suicidal or homicidal thoughts you must seek medical attention immediately by calling 911 or calling your MD immediately  if symptoms less severe.  You Must read complete instructions/literature along with all the possible adverse reactions/side effects for all the Medicines you take and that have been prescribed to you. Take any new Medicines after you have completely understood and accpet all the possible adverse reactions/side effects.   Do not drive, operating heavy machinery, perform activities at heights, swimming or participation in water activities or provide baby sitting services if your were admitted for syncope or siezures until you have seen by Primary MD or a Neurologist and advised to do so again.  Do not drive when taking Pain medications.    Do not take more than prescribed Pain, Sleep and Anxiety Medications  Special Instructions: If you have smoked or chewed Tobacco  in the last 2 yrs please stop smoking, stop any  regular Alcohol  and or any Recreational drug use.  Wear Seat belts while driving.   Please note  You were cared for by a hospitalist during your hospital stay. If you have any questions about your discharge medications or the care you received while you were in the hospital after you are discharged, you can call the unit and asked to speak with the hospitalist on call if the hospitalist that took care of you is not available. Once you are discharged, your primary care physician will handle any further medical issues. Please note that NO REFILLS for any discharge medications will be authorized once you are discharged, as it is imperative that you return to your primary care physician (or establish a relationship with a primary care physician if you do not have one) for your aftercare needs so that they can reassess your need for medications and monitor your lab values.

## 2014-11-14 ENCOUNTER — Telehealth: Payer: Self-pay | Admitting: Behavioral Health

## 2014-11-14 NOTE — Telephone Encounter (Signed)
Transition Care Management Follow-up Telephone Call   Date discharged? 11/13/14   How have you been since you were released from the hospital? Patient states, "I'm fine".   Do you understand why you were in the hospital? yes, pt. voiced that his liver and gall bladder were elevated and it was causing him to have pain.   Do you understand the discharge instructions? yes   Where were you discharged to? Home with wife.   Items Reviewed:  Medications reviewed: yes  Allergies reviewed: yes  Dietary changes reviewed: yes, clear liquid diet in the hospital.  Referrals reviewed: yes   Functional Questionnaire:   Activities of Daily Living (ADLs):   He states they are independent in the following: ambulation, bathing and hygiene, feeding, continence, grooming, toileting and dressing States they require assistance with the following: None   Any transportation issues/concerns?: no   Any patient concerns? no   Confirmed importance and date/time of follow-up visits scheduled yes, 11/19/14 at 2:30 PM.  Provider Appointment booked with Brunetta Jeans, PA-C.   Confirmed with patient if condition begins to worsen call PCP or go to the ER.  Patient was given the office number and encouraged to call back with question or concerns.  : yes

## 2014-11-15 ENCOUNTER — Other Ambulatory Visit: Payer: Self-pay | Admitting: Gastroenterology

## 2014-11-15 ENCOUNTER — Telehealth: Payer: Self-pay | Admitting: *Deleted

## 2014-11-15 NOTE — Telephone Encounter (Signed)
Request for surgical clearance:  1. What type of surgery is being performed? EUS FOR CBD STONES   2. When is this surgery scheduled? 11/21/14   3. Are there any medications that need to be held prior to surgery and how long? WARFARIN AND WHEN TO RESUME   4. Name of physician performing surgery? DR OUTLAW   5. What is your office phone and fax number? [phone]   Will forward to  Dr. Mare Ferrari for review

## 2014-11-15 NOTE — Telephone Encounter (Signed)
Faxed to Dr Erlinda Hong office, received confirmation

## 2014-11-15 NOTE — Telephone Encounter (Signed)
Hold warfarin 5 days before procedure. Restart when okay with endoscopist.

## 2014-11-16 ENCOUNTER — Encounter (HOSPITAL_COMMUNITY): Payer: Self-pay | Admitting: *Deleted

## 2014-11-16 NOTE — Addendum Note (Signed)
Addended by: Arta Silence on: 11/16/2014 01:41 PM   Modules accepted: Orders

## 2014-11-19 ENCOUNTER — Ambulatory Visit (INDEPENDENT_AMBULATORY_CARE_PROVIDER_SITE_OTHER): Payer: Medicare Other | Admitting: Physician Assistant

## 2014-11-19 ENCOUNTER — Encounter: Payer: Self-pay | Admitting: Physician Assistant

## 2014-11-19 VITALS — BP 92/51 | HR 72 | Temp 98.0°F | Resp 16 | Ht 75.0 in | Wt 208.1 lb

## 2014-11-19 DIAGNOSIS — R1013 Epigastric pain: Secondary | ICD-10-CM | POA: Diagnosis not present

## 2014-11-19 DIAGNOSIS — R748 Abnormal levels of other serum enzymes: Secondary | ICD-10-CM

## 2014-11-19 NOTE — Progress Notes (Signed)
Pre visit review using our clinic review tool, if applicable. No additional management support is needed unless otherwise documented below in the visit note/SLS  

## 2014-11-19 NOTE — Assessment & Plan Note (Signed)
Asymptomatic. Though to be passed stone. Has OP MRCP scheduled Wednesday. Will repeat CBC and CMP today. Do not see need to repeat INR presently as coumadin is being held. Patient to return to clinic for RN INR check 1 week after restarting his coumadin.

## 2014-11-19 NOTE — Progress Notes (Signed)
Patient presents to clinic today for TCM visit after hospitalization for choledocolithiasis and epigastric pain. Patient admitted to hospital on 11/10/13 after ER assessment for epigastric pain, nausea and vomiting. Liver enzymes elevated but CT negative. Liver enzymes trended and returned to normal during hospitalization. Was felt he could be discharged with outpatient GI follow-up and OP MRCP. Of note coumadin noted to be supratherapeutic. Patient instructed to follow-up with PCP in 3 days for repeat labs.  Since discharge on 11/13/14, patient denies any recurrence of GI symptoms. Has appointment for procedure with GI this Wednesday. Was contacted by Cardiology on 11/15/14 and instructed to hold coumadin 5 days before his procedure. Has done as directed. States he feels great.  Past Medical History  Diagnosis Date  . Bursitis     OF THE RIGHT SHOULDER  . Tinnitus   . Permanent atrial fibrillation (HCC)     s/p AV nodal ablation  . Depressed ejection fraction 10    EF previously 45-50%, s/p AV nodal ablation and biv pacemaker implant, previously enrolled in Block HF and was randomized to RV pacing only)  . Blood transfusion     "several; Dr. Deberah Pelton" (04/11/2014)  . GERD (gastroesophageal reflux disease)   . MDS (myelodysplastic syndrome), low grade ([place]) 02/10/2011  . Iron overload, transfusional 02/10/2011  . Complete heart block ([place]) 12/28/06    s/p AV nodal ablation and BIV pacemaker implant at [facility] by Dr Rosita Fire  . CHF (congestive heart failure) ([place])   . Chronic bronchitis ([place])     "I keep it"  . H/O hiatal hernia   . Gout   . Presence of permanent cardiac pacemaker   . Chronic anemia     RECIEVES ARANESP SHOTS IF HIS HEMOGLOBIN FALLS BELOW 11  . Bone marrow disorder     "I don't make enough RBCs"  . Arthritis     "hands, back" (04/11/2014)  . Pneumonia     "high school; [employer]; three times since, last time 10/2013" (04/11/2014)  . Gout 08/27/2014  .  Preventative health care 09/09/2014    Dr Dorina Hoyer, urology, annually Dr Clover Mealy Dr Sharol Given Dr Ninfa Linden Dr Gershon Crane, opthamology Dr Rayann Heman cardiology Dr Marin Olp   . Kidney stones 1985  . Chronic kidney disease (CKD)     "they work at ~ 30%" (04/11/2014). Dr. Marcelline Deist released pt from office visit last week.    Current Outpatient Prescriptions on File Prior to Visit  Medication Sig Dispense Refill  . allopurinol (ZYLOPRIM) 300 MG tablet Take 300 mg by mouth at bedtime.     . carvedilol (COREG) 6.25 MG tablet Take 1 tablet (6.25 mg total) by mouth 2 (two) times daily with a meal. 180 tablet 1  . doxazosin (CARDURA) 4 MG tablet Take 4 mg by mouth at bedtime.     . famotidine (PEPCID) 40 MG tablet Take 40 mg by mouth 2 (two) times daily.    . finasteride (PROSCAR) 5 MG tablet Take 5 mg by mouth every evening.     . furosemide (LASIX) 40 MG tablet TAKE ONE TABLET BY MOUTH ONCE DAILY. 90 tablet 3  . Multiple Vitamin (MULTIVITAMIN WITH MINERALS) TABS tablet Take 1 tablet by mouth daily.    Marland Kitchen pyridoxine (B-6) 100 MG tablet Take 100 mg by mouth 2 (two) times daily.     Marland Kitchen warfarin (COUMADIN) 5 MG tablet TAKE ONE TABLET BY MOUTH ONCE DAILY AT SUPPER EXCEPT ON SUNDAY (Patient taking differently: Take 5 mg by mouth as directed.  TAKE ONE TABLET BY MOUTH ONCE DAILY AT SUPPER EXCEPT ON SUNDAY) 90 tablet 0   No current facility-administered medications on file prior to visit.    Allergies  Allergen Reactions  . Methylprednisolone Other (See Comments)    Dizziness, passed out twice  . Moxifloxacin Hcl In Nacl Other (See Comments) and Itching    Pt c/o being "out of his head" & itching  . Ramipril Other (See Comments)    Renal function worsened on ACEi.    Family History  Problem Relation Age of Onset  . Cancer Mother   . Melanoma Mother   . COPD Father   . Kidney disease Brother   . Heart disease Maternal Grandfather   . Heart attack Neg Hx   . Hypertension Neg Hx   . Stroke Neg Hx     Social  History   Social History  . Marital Status: Married    Spouse Name: N/A  . Number of Children: N/A  . Years of Education: N/A   Social History Main Topics  . Smoking status: Former Smoker -- 1.00 packs/day for 25 years    Types: Cigarettes    Start date: 02/23/1944    Quit date: 02/10/1968  . Smokeless tobacco: Never Used  . Alcohol Use: 0.0 oz/week    0 Standard drinks or equivalent per week     Comment: 04/11/2014 "drank a little bit from age [age]". Denies use now  . Drug Use: No  . Sexual Activity: No     Comment: lives with wife, retired as a Scientist, research (life sciences), no dietary restrictions   Other Topics Concern  . None   Social History Narrative    Review of Systems - See HPI.  All other ROS are negative.  BP 92/51 mmHg  Pulse 72  Temp(Src) 98 F (36.7 C) (Oral)  Resp 16  Ht $R'6\' 3"'Cg$  (1.905 m)  Wt 208 lb 2 oz (94.405 kg)  BMI 26.01 kg/m2  SpO2 97%  Physical Exam  Constitutional: He is oriented to person, place, and time and well-developed, well-nourished, and in no distress.  HENT:  Head: Normocephalic and atraumatic.  Eyes: Conjunctivae are normal.  Cardiovascular: Normal rate, regular rhythm, normal heart sounds and intact distal pulses.   Pulmonary/Chest: Effort normal and breath sounds normal. No respiratory distress. He has no wheezes. He has no rales. He exhibits no tenderness.  Abdominal: Soft. Bowel sounds are normal. He exhibits no distension and no mass. There is no tenderness. There is no guarding.  Neurological: He is alert and oriented to person, place, and time.  Skin: Skin is warm and dry. No rash noted.  Psychiatric: Affect normal.  Vitals reviewed.   Recent Results (from the past 2160 hour(s))  CBC     Status: Abnormal   Collection Time: 08/27/14 12:02 PM  Result Value Ref Range   WBC 6.3 4.0 - 10.5 K/uL   RBC 3.73 (L) 4.22 - 5.81 Mil/uL   Platelets 119.0 (L) 150.0 - 400.0 K/uL   Hemoglobin 11.6 (L) 13.0 - 17.0 g/dL   HCT 35.3 (L) 39.0  - 52.0 %   MCV 94.5 78.0 - 100.0 fl   MCHC 32.9 30.0 - 36.0 g/dL   RDW 15.8 (H) 11.5 - 15.5 %  Comprehensive metabolic panel     Status: Abnormal   Collection Time: 08/27/14 12:02 PM  Result Value Ref Range   Sodium 142 135 - 145 mEq/L   Potassium 4.1 3.5 - 5.1 mEq/L   Chloride  106 96 - 112 mEq/L   CO2 28 19 - 32 mEq/L   Glucose, Bld 90 70 - 99 mg/dL   BUN 22 6 - 23 mg/dL   Creatinine, Ser 1.67 (H) 0.40 - 1.50 mg/dL   Total Bilirubin 1.6 (H) 0.2 - 1.2 mg/dL   Alkaline Phosphatase 73 39 - 117 U/L   AST 20 0 - 37 U/L   ALT 17 0 - 53 U/L   Total Protein 7.5 6.0 - 8.3 g/dL   Albumin 4.3 3.5 - 5.2 g/dL   Calcium 9.5 8.4 - 10.5 mg/dL   GFR 41.75 (L) >60.00 mL/min  Lipid panel     Status: None   Collection Time: 08/27/14 12:02 PM  Result Value Ref Range   Cholesterol 136 0 - 200 mg/dL    Comment: ATP III Classification       Desirable:  < 200 mg/dL               Borderline High:  200 - 239 mg/dL          High:  > = 240 mg/dL   Triglycerides 70.0 0.0 - 149.0 mg/dL    Comment: Normal:  <150 mg/dLBorderline High:  150 - 199 mg/dL   HDL 49.70 >39.00 mg/dL   VLDL 14.0 0.0 - 40.0 mg/dL   LDL Cholesterol 72 0 - 99 mg/dL   Total CHOL/HDL Ratio 3     Comment:                Men          Women1/2 Average Risk     3.4          3.3Average Risk          5.0          4.42X Average Risk          9.6          7.13X Average Risk          15.0          11.0                       NonHDL 86.09     Comment: NOTE:  Non-HDL goal should be 30 mg/dL higher than patient's LDL goal (i.e. LDL goal of < 70 mg/dL, would have non-HDL goal of < 100 mg/dL)  TSH     Status: None   Collection Time: 08/27/14 12:02 PM  Result Value Ref Range   TSH 2.51 0.35 - 4.50 uIU/mL  Uric acid     Status: None   Collection Time: 08/27/14 12:02 PM  Result Value Ref Range   Uric Acid, Serum 5.5 4.0 - 7.8 mg/dL  CBC with Differential [facility])     Status: Abnormal   Collection Time: 10/02/14  8:52 AM  Result Value Ref  Range   WBC 8.0 4.0 - 10.0 10e3/uL   RBC 3.27 (L) 4.20 - 5.70 10e6/uL   HGB 10.3 (L) 13.0 - 17.1 g/dL   HCT 31.2 (L) 38.7 - 49.9 %   MCV 95 82 - 98 fL   MCH 31.5 28.0 - 33.4 pg   MCHC 33.0 32.0 - 35.9 g/dL   RDW 14.6 11.1 - 15.7 %   Platelets 94 (L) 145 - 400 10e3/uL  Iron and TIBC     Status: Abnormal   Collection Time: 10/02/14  8:52 AM  Result Value Ref Range   Iron 45 42 -  163 ug/dL   TIBC 180 (L) 202 - 409 ug/dL   UIBC 135 117 - 376 ug/dL   %SAT 25 20 - 55 %  [facility] - Smear     Status: None   Collection Time: 10/02/14  8:52 AM  Result Value Ref Range   Smear Result Smear Available   Ferritin     Status: Abnormal   Collection Time: 10/02/14  8:52 AM  Result Value Ref Range   Ferritin 1,479 (H) 22 - 316 ng/ml  PROTIME-INR ([facility])     Status: Abnormal   Collection Time: 10/02/14  8:52 AM  Result Value Ref Range   Protime 39.6 (H) 10.6 - 13.4 Seconds   INR 3.3 2.0 - 3.5    Comment: INR is useful only to assess adequacy of anticoagulation with coumadin when comparing results from different labs. It should not be used to estimate bleeding risk or presence/abscense of coagulopathy in patients not on coumadin. Expected INR ranges for  nontherapeutic patients is 0.88 - 1.12.    Lovenox No   Manual Differential ([facility])     Status: Abnormal   Collection Time: 10/02/14  8:52 AM  Result Value Ref Range   ANC ([facility] manual diff) 4.7 1.5 - 6.5 10e3/uL   ALC 2.0 0.9 - 3.3 10e3/uL   SEG 58 40 - 75 %   Band Neutrophils 1 0 - 10 %   LYMPH 25 14 - 48 %   MONO 14 (H) 0 - 13 %   Eos 2 0 - 7 %   Polychromasia Slight Slight   Tear Drop Cell Few Negative   Ovalocyte Few Negative   PLT EST [place] Decreased Adequate   Platelet Morphology Within Normal Limits Within Normal Limits  Implantable device - remote     Status: None   Collection Time: 11/06/14  3:43 PM  Result Value Ref Range   Date Time Interrogation Session [id number]    Pulse Generator  Manufacturer MERM    Pulse Gen Model C4TR01 Consulta CRT-P    Pulse Gen Serial Number [id number]    Implantable Pulse Generator Type Cardiac Resynch Therapy Pacemaker    Implantable Pulse Generator Implant Date [id number]    Implantable Lead Manufacturer MERM    Implantable Lead Model 5076 CapSureFix Novus    Implantable Lead Serial Number [id number]    Implantable Lead Implant Date [id number]    Implantable Lead Location [id number]    Implantable Lead Manufacturer MERM    Implantable Lead Model 4193 Attain OTW    Implantable Lead Serial Number [id number]    Implantable Lead Implant Date [id number]    Implantable Lead Location [id number]    Lead Channel Setting Sensing Sensitivity 5.6 mV   Lead Channel Setting Pacing Pulse Width 0.4 ms   Lead Channel Setting Pacing Amplitude 2.5 V   Lead Channel Setting Pacing Pulse Width 0.8 ms   Lead Channel Setting Pacing Amplitude 2.5 V   Lead Channel Setting Pacing Capture Mode Fixed Pacing    Lead Channel Impedance Value 4047 ohm   Lead Channel Impedance Value 4047 ohm   Lead Channel Sensing Intrinsic Amplitude 31.625 mV   Lead Channel Sensing Intrinsic Amplitude 31.625 mV   Lead Channel Impedance Value 494 ohm   Lead Channel Impedance Value 418 ohm   Lead Channel Sensing Intrinsic Amplitude 15.625 mV   Lead Channel Sensing Intrinsic Amplitude 15.625 mV   Lead Channel Pacing Threshold Amplitude 0.625 V   Lead Channel Pacing Threshold Pulse  Width 0.4 ms   Lead Channel Impedance Value 4047 ohm   Lead Channel Impedance Value 4047 ohm   Lead Channel Impedance Value 665 ohm   Lead Channel Impedance Value 4047 ohm   Lead Channel Impedance Value 532 ohm   Battery Status OK    Battery Remaining Longevity 56 mo   Battery Voltage 3.00 V   Brady Statistic RA Percent Paced 0 %   Brady Statistic RV Percent Paced 100 %   Brady Statistic AP VP Percent 0 %   Brady Statistic AS VP Percent 100 %   Brady Statistic AP VS Percent 0 %   Brady Statistic AS VS  Percent 0 %   Eval Rhythm Bp   CBC WITH DIFFERENTIAL     Status: Abnormal   Collection Time: 11/11/14 10:15 AM  Result Value Ref Range   WBC 7.5 4.0 - 10.5 K/uL   RBC 3.49 (L) 4.22 - 5.81 MIL/uL   Hemoglobin 10.7 (L) 13.0 - 17.0 g/dL   HCT 03.3 (L) 08.9 - 97.1 %   MCV 95.1 78.0 - 100.0 fL   MCH 30.7 26.0 - 34.0 pg   MCHC 32.2 30.0 - 36.0 g/dL   RDW 67.4 93.2 - 81.2 %   Platelets 79 (L) 150 - 400 K/uL    Comment: REPEATED TO VERIFY PLATELET COUNT CONFIRMED BY SMEAR    Neutrophils Relative % 65 %   Lymphocytes Relative 17 %   Monocytes Relative 17 %   Eosinophils Relative 0 %   Basophils Relative 0 %   Band Neutrophils 1 %   Metamyelocytes Relative 0 %   Myelocytes 0 %   Promyelocytes Absolute 0 %   Blasts 0 %   nRBC 0 0 /100 WBC   Neutro Abs 4.9 1.7 - 7.7 K/uL   Lymphs Abs 1.3 0.7 - 4.0 K/uL   Monocytes Absolute 1.3 (H) 0.1 - 1.0 K/uL   Eosinophils Absolute 0.0 0.0 - 0.7 K/uL   Basophils Absolute 0.0 0.0 - 0.1 K/uL   RBC Morphology TEARDROP CELLS     Comment: ELLIPTOCYTES  Comprehensive metabolic panel     Status: Abnormal   Collection Time: 11/11/14 10:15 AM  Result Value Ref Range   Sodium 137 135 - 145 mmol/L   Potassium 4.3 3.5 - 5.1 mmol/L   Chloride 106 101 - 111 mmol/L   CO2 26 22 - 32 mmol/L   Glucose, Bld 122 (H) 65 - 99 mg/dL   BUN 23 (H) 6 - 20 mg/dL   Creatinine, Ser 7.27 (H) 0.61 - 1.24 mg/dL   Calcium 8.9 8.9 - 54.9 mg/dL   Total Protein 7.2 6.5 - 8.1 g/dL   Albumin 4.2 3.5 - 5.0 g/dL   AST 632 (H) 15 - 41 U/L   ALT 399 (H) 17 - 63 U/L   Alkaline Phosphatase 157 (H) 38 - 126 U/L   Total Bilirubin 1.3 (H) 0.3 - 1.2 mg/dL   GFR calc non Af Amer 41 (L) >60 mL/min   GFR calc Af Amer 47 (L) >60 mL/min    Comment: (NOTE) The eGFR has been calculated using the CKD EPI equation. This calculation has not been validated in all clinical situations. eGFR's persistently <60 mL/min signify possible Chronic Kidney Disease.    Anion gap 5 5 - 15  Lipase,  blood     Status: None   Collection Time: 11/11/14 10:15 AM  Result Value Ref Range   Lipase 42 11 - 51 U/L  Comment: Please note change in reference range.  Protime-INR     Status: Abnormal   Collection Time: 11/11/14 10:15 AM  Result Value Ref Range   Prothrombin Time 37.6 (H) 11.6 - 15.2 seconds   INR 3.94 (H) 0.00 - 1.49  Urinalysis, Routine w reflex microscopic     Status: None   Collection Time: 11/11/14  1:20 PM  Result Value Ref Range   Color, Urine YELLOW YELLOW   APPearance CLEAR CLEAR   Specific Gravity, Urine 1.020 1.005 - 1.030   pH 5.0 5.0 - 8.0   Glucose, UA NEGATIVE NEGATIVE mg/dL   Hgb urine dipstick NEGATIVE NEGATIVE   Bilirubin Urine NEGATIVE NEGATIVE   Ketones, ur NEGATIVE NEGATIVE mg/dL   Protein, ur NEGATIVE NEGATIVE mg/dL   Urobilinogen, UA 0.2 0.0 - 1.0 mg/dL   Nitrite NEGATIVE NEGATIVE   Leukocytes, UA NEGATIVE NEGATIVE    Comment: MICROSCOPIC NOT DONE ON URINES WITH NEGATIVE PROTEIN, BLOOD, LEUKOCYTES, NITRITE, OR GLUCOSE <1000 mg/dL.  Hepatitis panel, acute     Status: None   Collection Time: 11/11/14  1:54 PM  Result Value Ref Range   Hepatitis B Surface Ag Negative Negative   HCV Ab <0.1 0.0 - 0.9 s/co ratio    Comment: (NOTE)                                  Negative:     < 0.8                             Indeterminate: 0.8 - 0.9                                  Positive:     > 0.9 The CDC recommends that a positive HCV antibody result be followed up with a HCV Nucleic Acid Amplification test (419379). Performed At: [facility] [place], [place] [id number] Lindon Romp MD [id number]    Hep A IgM Negative Negative   Hep B C IgM Negative Negative  Hepatitis panel, acute     Status: None   Collection Time: 11/11/14  6:00 PM  Result Value Ref Range   Hepatitis B Surface Ag Negative Negative   HCV Ab <0.1 0.0 - 0.9 s/co ratio    Comment: (NOTE)                                  Negative:     < 0.8                              Indeterminate: 0.8 - 0.9                                  Positive:     > 0.9 The CDC recommends that a positive HCV antibody result be followed up with a HCV Nucleic Acid Amplification test (622297). Performed At: [facility] [place], [place] [id number] Lindon Romp MD [id number]    Hep A IgM Negative Negative   Hep B C IgM Negative Negative  Comprehensive metabolic panel     Status:  Abnormal   Collection Time: 11/12/14  4:33 AM  Result Value Ref Range   Sodium 138 135 - 145 mmol/L   Potassium 4.3 3.5 - 5.1 mmol/L   Chloride 109 101 - 111 mmol/L   CO2 24 22 - 32 mmol/L   Glucose, Bld 96 65 - 99 mg/dL   BUN 18 6 - 20 mg/dL   Creatinine, Ser 1.45 (H) 0.61 - 1.24 mg/dL   Calcium 8.4 (L) 8.9 - 10.3 mg/dL   Total Protein 5.6 (L) 6.5 - 8.1 g/dL   Albumin 3.0 (L) 3.5 - 5.0 g/dL   AST 113 (H) 15 - 41 U/L   ALT 248 (H) 17 - 63 U/L   Alkaline Phosphatase 123 38 - 126 U/L   Total Bilirubin 2.5 (H) 0.3 - 1.2 mg/dL   GFR calc non Af Amer 42 (L) >60 mL/min   GFR calc Af Amer 49 (L) >60 mL/min    Comment: (NOTE) The eGFR has been calculated using the CKD EPI equation. This calculation has not been validated in all clinical situations. eGFR's persistently <60 mL/min signify possible Chronic Kidney Disease.    Anion gap 5 5 - 15  CBC     Status: Abnormal   Collection Time: 11/12/14  4:33 AM  Result Value Ref Range   WBC 8.7 4.0 - 10.5 K/uL   RBC 3.12 (L) 4.22 - 5.81 MIL/uL   Hemoglobin 9.3 (L) 13.0 - 17.0 g/dL   HCT 29.2 (L) 39.0 - 52.0 %   MCV 93.6 78.0 - 100.0 fL   MCH 29.8 26.0 - 34.0 pg   MCHC 31.8 30.0 - 36.0 g/dL   RDW 15.2 11.5 - 15.5 %   Platelets 76 (L) 150 - 400 K/uL    Comment: PLATELET COUNT CONFIRMED BY SMEAR  Comprehensive metabolic panel     Status: Abnormal   Collection Time: 11/13/14  7:17 AM  Result Value Ref Range   Sodium 140 135 - 145 mmol/L   Potassium 4.2 3.5 - 5.1 mmol/L   Chloride 110 101 - 111 mmol/L   CO2  26 22 - 32 mmol/L   Glucose, Bld 94 65 - 99 mg/dL   BUN 16 6 - 20 mg/dL   Creatinine, Ser 1.41 (H) 0.61 - 1.24 mg/dL   Calcium 8.8 (L) 8.9 - 10.3 mg/dL   Total Protein 6.0 (L) 6.5 - 8.1 g/dL   Albumin 3.1 (L) 3.5 - 5.0 g/dL   AST 55 (H) 15 - 41 U/L   ALT 173 (H) 17 - 63 U/L   Alkaline Phosphatase 118 38 - 126 U/L   Total Bilirubin 1.4 (H) 0.3 - 1.2 mg/dL   GFR calc non Af Amer 44 (L) >60 mL/min   GFR calc Af Amer 51 (L) >60 mL/min    Comment: (NOTE) The eGFR has been calculated using the CKD EPI equation. This calculation has not been validated in all clinical situations. eGFR's persistently <60 mL/min signify possible Chronic Kidney Disease.    Anion gap 4 (L) 5 - 15  CBC     Status: Abnormal   Collection Time: 11/13/14  7:17 AM  Result Value Ref Range   WBC 6.7 4.0 - 10.5 K/uL   RBC 3.19 (L) 4.22 - 5.81 MIL/uL   Hemoglobin 9.8 (L) 13.0 - 17.0 g/dL   HCT 30.0 (L) 39.0 - 52.0 %   MCV 94.0 78.0 - 100.0 fL   MCH 30.7 26.0 - 34.0 pg   MCHC 32.7 30.0 - 36.0  g/dL   RDW 15.3 11.5 - 15.5 %   Platelets 81 (L) 150 - 400 K/uL    Comment: REPEATED TO VERIFY CONSISTENT WITH PREVIOUS RESULT   Protime-INR     Status: Abnormal   Collection Time: 11/13/14  7:17 AM  Result Value Ref Range   Prothrombin Time 31.0 (H) 11.6 - 15.2 seconds   INR 3.06 (H) 0.00 - 1.49    Assessment/Plan: Abdominal pain, epigastric Asymptomatic. Though to be passed stone. Has OP MRCP scheduled Wednesday. Will repeat CBC and CMP today. Do not see need to repeat INR presently as coumadin is being held. Patient to return to clinic for RN INR check 1 week after restarting his coumadin.

## 2014-11-19 NOTE — Patient Instructions (Signed)
Please go to the lab for blood work.  We will call with your results. Stay off of the coumadin until you have your procedure and the GI doctor tells you to restart.  Follow-up here with the nurse 1 week after restarting your coumadin for a PT/INR check

## 2014-11-20 ENCOUNTER — Other Ambulatory Visit: Payer: Self-pay | Admitting: Gastroenterology

## 2014-11-20 ENCOUNTER — Ambulatory Visit: Payer: Medicare Other

## 2014-11-20 LAB — COMPREHENSIVE METABOLIC PANEL
ALT: 49 U/L (ref 0–53)
AST: 21 U/L (ref 0–37)
Albumin: 3.8 g/dL (ref 3.5–5.2)
Alkaline Phosphatase: 109 U/L (ref 39–117)
BUN: 21 mg/dL (ref 6–23)
CO2: 29 meq/L (ref 19–32)
Calcium: 9 mg/dL (ref 8.4–10.5)
Chloride: 106 mEq/L (ref 96–112)
Creatinine, Ser: 1.63 mg/dL — ABNORMAL HIGH (ref 0.40–1.50)
GFR: 42.91 mL/min — AB (ref >60.00)
GLUCOSE: 102 mg/dL — AB (ref 70–99)
Potassium: 3.8 mEq/L (ref 3.5–5.1)
SODIUM: 141 meq/L (ref 135–145)
Total Bilirubin: 1.2 mg/dL (ref 0.2–1.2)
Total Protein: 7 g/dL (ref 6.0–8.3)

## 2014-11-20 LAB — CBC
HEMATOCRIT: 31.5 % — AB (ref 39.0–52.0)
HEMOGLOBIN: 10.3 g/dL — AB (ref 13.0–17.0)
MCHC: 32.6 g/dL (ref 30.0–36.0)
MCV: 94 fl (ref 78.0–100.0)
Platelets: 129 10*3/uL — ABNORMAL LOW (ref 150.0–400.0)
RBC: 3.35 Mil/uL — ABNORMAL LOW (ref 4.22–5.81)
RDW: 16 % — AB (ref 11.5–15.5)
WBC: 8.2 10*3/uL (ref 4.0–10.5)

## 2014-11-20 NOTE — Addendum Note (Signed)
Addended by: Arta Silence on: 11/20/2014 04:42 PM   Modules accepted: Orders

## 2014-11-21 ENCOUNTER — Ambulatory Visit (HOSPITAL_COMMUNITY): Payer: Medicare Other | Admitting: Anesthesiology

## 2014-11-21 ENCOUNTER — Encounter (HOSPITAL_COMMUNITY): Payer: Self-pay

## 2014-11-21 ENCOUNTER — Encounter (HOSPITAL_COMMUNITY)
Admission: RE | Disposition: A | Discharge status: Home / Self Care | Payer: Self-pay | Source: Ambulatory Visit | Attending: Gastroenterology

## 2014-11-21 ENCOUNTER — Ambulatory Visit (HOSPITAL_COMMUNITY)
Admission: RE | Admit: 2014-11-21 | Discharge: 2014-11-21 | Disposition: A | Discharge status: Home / Self Care | Payer: Medicare Other | Source: Ambulatory Visit | Attending: Gastroenterology | Admitting: Gastroenterology

## 2014-11-21 DIAGNOSIS — Z950 Presence of cardiac pacemaker: Secondary | ICD-10-CM | POA: Diagnosis not present

## 2014-11-21 DIAGNOSIS — K8020 Calculus of gallbladder without cholecystitis without obstruction: Secondary | ICD-10-CM | POA: Insufficient documentation

## 2014-11-21 DIAGNOSIS — M1990 Unspecified osteoarthritis, unspecified site: Secondary | ICD-10-CM | POA: Diagnosis not present

## 2014-11-21 DIAGNOSIS — I509 Heart failure, unspecified: Secondary | ICD-10-CM | POA: Insufficient documentation

## 2014-11-21 DIAGNOSIS — K449 Diaphragmatic hernia without obstruction or gangrene: Secondary | ICD-10-CM | POA: Insufficient documentation

## 2014-11-21 DIAGNOSIS — Z7901 Long term (current) use of anticoagulants: Secondary | ICD-10-CM | POA: Diagnosis not present

## 2014-11-21 DIAGNOSIS — N189 Chronic kidney disease, unspecified: Secondary | ICD-10-CM | POA: Diagnosis not present

## 2014-11-21 DIAGNOSIS — Z87891 Personal history of nicotine dependence: Secondary | ICD-10-CM | POA: Insufficient documentation

## 2014-11-21 DIAGNOSIS — Z96653 Presence of artificial knee joint, bilateral: Secondary | ICD-10-CM | POA: Insufficient documentation

## 2014-11-21 DIAGNOSIS — K219 Gastro-esophageal reflux disease without esophagitis: Secondary | ICD-10-CM | POA: Insufficient documentation

## 2014-11-21 DIAGNOSIS — Z79899 Other long term (current) drug therapy: Secondary | ICD-10-CM | POA: Diagnosis not present

## 2014-11-21 DIAGNOSIS — M109 Gout, unspecified: Secondary | ICD-10-CM | POA: Insufficient documentation

## 2014-11-21 DIAGNOSIS — I4891 Unspecified atrial fibrillation: Secondary | ICD-10-CM | POA: Diagnosis not present

## 2014-11-21 DIAGNOSIS — R7989 Other specified abnormal findings of blood chemistry: Secondary | ICD-10-CM | POA: Diagnosis present

## 2014-11-21 HISTORY — PX: EUS: SHX5427

## 2014-11-21 LAB — COMPREHENSIVE METABOLIC PANEL
ALT: 40 U/L (ref 17–63)
AST: 24 U/L (ref 15–41)
Albumin: 4 g/dL (ref 3.5–5.0)
Alkaline Phosphatase: 97 U/L (ref 38–126)
Anion gap: 3 — ABNORMAL LOW (ref 5–15)
BUN: 19 mg/dL (ref 6–20)
CHLORIDE: 107 mmol/L (ref 101–111)
CO2: 29 mmol/L (ref 22–32)
CREATININE: 1.5 mg/dL — AB (ref 0.61–1.24)
Calcium: 8.5 mg/dL — ABNORMAL LOW (ref 8.9–10.3)
GFR, EST AFRICAN AMERICAN: 47 mL/min — AB (ref >60)
GFR, EST NON AFRICAN AMERICAN: 41 mL/min — AB (ref >60)
Glucose, Bld: 113 mg/dL — ABNORMAL HIGH (ref 65–99)
POTASSIUM: 4.2 mmol/L (ref 3.5–5.1)
SODIUM: 139 mmol/L (ref 135–145)
Total Bilirubin: 1.9 mg/dL — ABNORMAL HIGH (ref 0.3–1.2)
Total Protein: 7 g/dL (ref 6.5–8.1)

## 2014-11-21 LAB — CBC WITH DIFFERENTIAL/PLATELET
Basophils Absolute: 0 10*3/uL (ref 0.0–0.1)
Basophils Relative: 0 %
EOS ABS: 0 10*3/uL (ref 0.0–0.7)
EOS PCT: 0 %
HCT: 31.6 % — ABNORMAL LOW (ref 39.0–52.0)
Hemoglobin: 10.3 g/dL — ABNORMAL LOW (ref 13.0–17.0)
LYMPHS ABS: 1.7 10*3/uL (ref 0.7–4.0)
LYMPHS PCT: 37 %
MCH: 31 pg (ref 26.0–34.0)
MCHC: 32.6 g/dL (ref 30.0–36.0)
MCV: 95.2 fL (ref 78.0–100.0)
MONO ABS: 1.2 10*3/uL — AB (ref 0.1–1.0)
MONOS PCT: 25 %
Neutro Abs: 1.8 10*3/uL (ref 1.7–7.7)
Neutrophils Relative %: 38 %
PLATELETS: 109 10*3/uL — AB (ref 150–400)
RBC: 3.32 MIL/uL — ABNORMAL LOW (ref 4.22–5.81)
RDW: 15.4 % (ref 11.5–15.5)
WBC: 4.7 10*3/uL (ref 4.0–10.5)

## 2014-11-21 LAB — PROTIME-INR
INR: 1.24 (ref 0.00–1.49)
PROTHROMBIN TIME: 15.8 s — AB (ref 11.6–15.2)

## 2014-11-21 SURGERY — UPPER ENDOSCOPIC ULTRASOUND (EUS) RADIAL
Anesthesia: Monitor Anesthesia Care

## 2014-11-21 MED ORDER — PROPOFOL 10 MG/ML IV BOLUS
INTRAVENOUS | Status: AC
  Filled 2014-11-21: qty 20

## 2014-11-21 MED ORDER — SODIUM CHLORIDE 0.9 % IV SOLN: INTRAVENOUS | Status: DC

## 2014-11-21 MED ORDER — PROPOFOL 10 MG/ML IV BOLUS
INTRAVENOUS | Status: DC | PRN
  Administered 2014-11-21: 50 mg via INTRAVENOUS

## 2014-11-21 MED ORDER — SODIUM CHLORIDE 0.9 % IV SOLN
INTRAVENOUS | Status: DC
Start: 2014-11-21 — End: 2014-11-21

## 2014-11-21 MED ORDER — LIDOCAINE HCL (CARDIAC) 20 MG/ML IV SOLN
INTRAVENOUS | Status: DC | PRN
  Administered 2014-11-21: 50 mg via INTRAVENOUS

## 2014-11-21 MED ORDER — PROMETHAZINE HCL 25 MG/ML IJ SOLN: 6.2500 mg | INTRAMUSCULAR | Status: DC | PRN

## 2014-11-21 MED ORDER — LACTATED RINGERS IV SOLN
INTRAVENOUS | Status: DC
  Administered 2014-11-21: 1000 mL via INTRAVENOUS

## 2014-11-21 MED ORDER — LIDOCAINE HCL (CARDIAC) 20 MG/ML IV SOLN
INTRAVENOUS | Status: AC
  Filled 2014-11-21: qty 5

## 2014-11-21 MED ORDER — PROPOFOL 500 MG/50ML IV EMUL
INTRAVENOUS | Status: DC | PRN
  Administered 2014-11-21: 120 ug/kg/min via INTRAVENOUS

## 2014-11-21 NOTE — Anesthesia Postprocedure Evaluation (Signed)
  Anesthesia Post-op Note  Patient: [name]  Procedure(s) Performed: Procedure(s) (LRB): UPPER ENDOSCOPIC ULTRASOUND (EUS) RADIAL (N/A)  Patient Location: PACU  Anesthesia Type: MAC  Level of Consciousness: awake and alert   Airway and Oxygen Therapy: Patient Spontanous Breathing  Post-op Pain: mild  Post-op Assessment: Post-op Vital signs reviewed, Patient's Cardiovascular Status Stable, Respiratory Function Stable, Patent Airway and No signs of Nausea or vomiting  Last Vitals:  Filed Vitals:   11/21/14 1300  BP: 137/71  Pulse: 70  Temp:   Resp: 15    Post-op Vital Signs: stable   Complications: No apparent anesthesia complications

## 2014-11-21 NOTE — Discharge Instructions (Signed)
Endoscopic Ultrasound  Care After Please read the instructions outlined below and refer to this sheet in the next few weeks. These discharge instructions provide you with general information on caring for yourself after you leave the hospital. Your doctor may also give you specific instructions. While your treatment has been planned according to the most current medical practices available, unavoidable complications occasionally occur. If you have any problems or questions after discharge, please call Dr. Paulita Fujita [facility]) at [phone].  HOME CARE INSTRUCTIONS Activity  You may resume your regular activity but move at a slower pace for the next 24 hours.   Take frequent rest periods for the next 24 hours.   Walking will help expel (get rid of) the air and reduce the bloated feeling in your abdomen.   No driving for 24 hours (because of the anesthesia (medicine) used during the test).   You may shower.   Do not sign any important legal documents or operate any machinery for 24 hours (because of the anesthesia used during the test).  Nutrition  Drink plenty of fluids.   You may resume your normal diet.   Begin with a light meal and progress to your normal diet.   Avoid alcoholic beverages for 24 hours or as instructed by your caregiver.  Medications Stay OFF warfarin for the time-being, as we try to coordinate ERCP procedure with Dr. Watt Climes What you can expect today  You may experience abdominal discomfort such as a feeling of fullness or "gas" pains.   You may experience a sore throat for 2 to 3 days. This is normal. Gargling with salt water may help this.    SEEK IMMEDIATE MEDICAL CARE IF:  You have excessive nausea (feeling sick to your stomach) and/or vomiting.   You have severe abdominal pain and distention (swelling).   You have trouble swallowing.   You have a temperature over 100 F (37.8 C).   You have rectal bleeding or vomiting of blood.    Document Released: 08/20/2003 Document Revised: 09/17/2010 Document Reviewed: 03/02/2007 [facility] Patient Information 2012 [place].

## 2014-11-21 NOTE — Transfer of Care (Signed)
Immediate Anesthesia Transfer of Care Note  Patient: [name]  Procedure(s) Performed: Procedure(s): UPPER ENDOSCOPIC ULTRASOUND (EUS) RADIAL (N/A)  Patient Location: PACU  Anesthesia Type:MAC  Level of Consciousness: awake, alert  and oriented  Airway & Oxygen Therapy: Patient Spontanous Breathing and Patient connected to nasal cannula oxygen  Post-op Assessment: Report given to RN and Post -op Vital signs reviewed and stable  Post vital signs: Reviewed and stable  Last Vitals:  Filed Vitals:   11/21/14 1008  BP: 139/76  Pulse: 73  Temp: 36.5 C  Resp: 16    Complications: No apparent anesthesia complications

## 2014-11-21 NOTE — H&P (Signed)
Patient interval history reviewed.  Patient examined again.  There has been no change from documented H/P dated 11/20/14 (scanned into chart from our office) except as documented above.  Assessment:  1.  Abdominal pain, resolved. 2.  Elevated LFTs.  Plan:  1.  Endoscopic ultrasound to assess for cholelithiasis +/- choledocholithiasis. 2.  Risks (bleeding, infection, bowel perforation that could require surgery, sedation-related changes in cardiopulmonary systems), benefits (identification and possible treatment of source of symptoms, exclusion of certain causes of symptoms), and alternatives (watchful waiting, radiographic imaging studies, empiric medical treatment) of upper endoscopy with ultrasound (EUS) were explained to patient/family in detail and patient wishes to proceed.

## 2014-11-21 NOTE — Anesthesia Preprocedure Evaluation (Signed)
Anesthesia Evaluation  Patient identified by MRN, date of birth, ID band Patient awake    Reviewed: Allergy & Precautions, NPO status , Patient's Chart, lab work & pertinent test results  Airway Mallampati: II  TM Distance: >3 FB Neck ROM: Full    Dental no notable dental hx.    Pulmonary neg pulmonary ROS, former smoker,    Pulmonary exam normal breath sounds clear to auscultation       Cardiovascular Normal cardiovascular exam+ dysrhythmias Atrial Fibrillation + pacemaker  Rhythm:Regular Rate:Normal  Depressed ejection fraction 10 EF previously 45-50%, s/p AV nodal ablation and biv pacemaker implant, previously enrolled in Block HF and was randomized to RV pacing only)     Neuro/Psych negative neurological ROS  negative psych ROS   GI/Hepatic negative GI ROS, Neg liver ROS,   Endo/Other  negative endocrine ROS  Renal/GU negative Renal ROS  negative genitourinary   Musculoskeletal negative musculoskeletal ROS (+)   Abdominal   Peds negative pediatric ROS (+)  Hematology negative hematology ROS (+) anticoagulated   Anesthesia Other Findings   Reproductive/Obstetrics negative OB ROS                             Anesthesia Physical Anesthesia Plan  ASA: III  Anesthesia Plan: MAC   Post-op Pain Management:    Induction: Intravenous  Airway Management Planned: Nasal Cannula  Additional Equipment:   Intra-op Plan:   Post-operative Plan:   Informed Consent: I have reviewed the patients History and Physical, chart, labs and discussed the procedure including the risks, benefits and alternatives for the proposed anesthesia with the patient or authorized representative who has indicated his/her understanding and acceptance.   Dental advisory given  Plan Discussed with: CRNA and Surgeon  Anesthesia Plan Comments:         Anesthesia Quick Evaluation

## 2014-11-21 NOTE — Op Note (Signed)
[place], [address]   ENDOSCOPIC ULTRASOUND PROCEDURE REPORT  PATIENT: [name], [name]  MR#: [id number] BIRTHDATE: 1927-03-05  GENDER: male ENDOSCOPIST: Arta Silence, MD REFERRED BY:  Clarene Essex, M.D. PROCEDURE DATE:  11/21/2014 PROCEDURE:   Upper EUS ASA CLASS:      Class III INDICATIONS:   1.  abdominal pain, elevated LFTs. MEDICATIONS: Monitored anesthesia care  DESCRIPTION OF PROCEDURE:   After the risks benefits and alternatives of the procedure were  explained, informed consent was obtained. The patient was then placed in the left, lateral, decubitus postion and IV sedation was administered. Throughout the procedure, the patients blood pressure, pulse and oxygen saturations were monitored continuously.  Under direct visualization, the radial echoendoscope was introduced through the mouth  and advanced to the second portion of the duodenum .  Water was used as necessary to provide an acoustic interface. Estimated blood loss is zero unless otherwise noted in this procedure report. Upon completion of the imaging, water was removed and the patient was sent to the recovery room in satisfactory condition.    FINDINGS:      CBD maximally about 86mm in diameter.  Approximately 48mm hyperechoic round intraluminal defect seen within the distal common bile duct just proximal to the ampulla; there was some post-acoustic shadowing noted, but it was fairly mild.  Gallbladder has several similar-appearing intraluminal defects, again with some mild post-acoustic shadowing.  Also suggestion of some gallbladder sludge as well.  IMPRESSION:     Gallstones.  Suspected distal bile duct stone.  RECOMMENDATIONS:     1.  Watch for potential complications of procedure. 2.  Suspect patient would benefit from ERCP; will discuss with Dr. Watt Climes.   _______________________________ Lorrin MaisArta Silence, MD 11/21/2014 12:54 PM   CC:

## 2014-11-22 ENCOUNTER — Encounter (HOSPITAL_COMMUNITY): Payer: Self-pay | Admitting: Gastroenterology

## 2014-11-26 ENCOUNTER — Other Ambulatory Visit: Payer: Self-pay | Admitting: Gastroenterology

## 2014-11-26 DIAGNOSIS — Z01810 Encounter for preprocedural cardiovascular examination: Secondary | ICD-10-CM

## 2014-11-26 NOTE — Addendum Note (Signed)
Addended byClarene Essex on: 11/26/2014 01:38 PM   Modules accepted: Orders

## 2014-11-27 ENCOUNTER — Encounter (HOSPITAL_COMMUNITY): Payer: Self-pay | Admitting: *Deleted

## 2014-11-27 ENCOUNTER — Ambulatory Visit (HOSPITAL_COMMUNITY): Payer: Medicare Other

## 2014-11-27 ENCOUNTER — Encounter (HOSPITAL_COMMUNITY)
Admission: RE | Disposition: A | Discharge status: Home / Self Care | Payer: Self-pay | Source: Ambulatory Visit | Attending: Gastroenterology

## 2014-11-27 ENCOUNTER — Ambulatory Visit (HOSPITAL_COMMUNITY): Payer: Medicare Other | Admitting: Certified Registered Nurse Anesthetist

## 2014-11-27 ENCOUNTER — Ambulatory Visit (HOSPITAL_COMMUNITY)
Admission: RE | Admit: 2014-11-27 | Discharge: 2014-11-27 | Disposition: A | Discharge status: Home / Self Care | Payer: Medicare Other | Source: Ambulatory Visit | Attending: Gastroenterology | Admitting: Gastroenterology

## 2014-11-27 DIAGNOSIS — K8050 Calculus of bile duct without cholangitis or cholecystitis without obstruction: Secondary | ICD-10-CM | POA: Diagnosis not present

## 2014-11-27 DIAGNOSIS — K449 Diaphragmatic hernia without obstruction or gangrene: Secondary | ICD-10-CM | POA: Diagnosis not present

## 2014-11-27 DIAGNOSIS — Z7901 Long term (current) use of anticoagulants: Secondary | ICD-10-CM | POA: Diagnosis not present

## 2014-11-27 DIAGNOSIS — K219 Gastro-esophageal reflux disease without esophagitis: Secondary | ICD-10-CM | POA: Diagnosis not present

## 2014-11-27 DIAGNOSIS — Z950 Presence of cardiac pacemaker: Secondary | ICD-10-CM | POA: Insufficient documentation

## 2014-11-27 DIAGNOSIS — M109 Gout, unspecified: Secondary | ICD-10-CM | POA: Diagnosis not present

## 2014-11-27 DIAGNOSIS — Z87891 Personal history of nicotine dependence: Secondary | ICD-10-CM | POA: Insufficient documentation

## 2014-11-27 DIAGNOSIS — I4891 Unspecified atrial fibrillation: Secondary | ICD-10-CM | POA: Insufficient documentation

## 2014-11-27 DIAGNOSIS — Z96653 Presence of artificial knee joint, bilateral: Secondary | ICD-10-CM | POA: Insufficient documentation

## 2014-11-27 DIAGNOSIS — I509 Heart failure, unspecified: Secondary | ICD-10-CM | POA: Insufficient documentation

## 2014-11-27 DIAGNOSIS — M1990 Unspecified osteoarthritis, unspecified site: Secondary | ICD-10-CM | POA: Diagnosis not present

## 2014-11-27 DIAGNOSIS — R9389 Abnormal findings on diagnostic imaging of other specified body structures: Secondary | ICD-10-CM

## 2014-11-27 DIAGNOSIS — Z79899 Other long term (current) drug therapy: Secondary | ICD-10-CM | POA: Insufficient documentation

## 2014-11-27 DIAGNOSIS — N189 Chronic kidney disease, unspecified: Secondary | ICD-10-CM | POA: Insufficient documentation

## 2014-11-27 DIAGNOSIS — R7989 Other specified abnormal findings of blood chemistry: Secondary | ICD-10-CM | POA: Diagnosis present

## 2014-11-27 HISTORY — PX: ERCP: SHX5425

## 2014-11-27 LAB — TYPE AND SCREEN
ABO/RH(D): A POS
Antibody Screen: NEGATIVE

## 2014-11-27 SURGERY — ERCP, WITH INTERVENTION IF INDICATED
Anesthesia: General

## 2014-11-27 MED ORDER — FENTANYL CITRATE (PF) 100 MCG/2ML IJ SOLN
INTRAMUSCULAR | Status: AC
  Filled 2014-11-27: qty 4

## 2014-11-27 MED ORDER — SODIUM CHLORIDE 0.9 % IV SOLN: INTRAVENOUS | Status: DC

## 2014-11-27 MED ORDER — SUCCINYLCHOLINE CHLORIDE 20 MG/ML IJ SOLN
INTRAMUSCULAR | Status: DC | PRN
  Administered 2014-11-27: 100 mg via INTRAVENOUS

## 2014-11-27 MED ORDER — CEFOTETAN DISODIUM 2 G IJ SOLR
2.0000 g | Freq: Once | INTRAMUSCULAR | Status: AC
  Administered 2014-11-27: 2 g via INTRAVENOUS
  Filled 2014-11-27 (×2): qty 2

## 2014-11-27 MED ORDER — FENTANYL CITRATE (PF) 100 MCG/2ML IJ SOLN
INTRAMUSCULAR | Status: DC | PRN
  Administered 2014-11-27 (×2): 50 ug via INTRAVENOUS

## 2014-11-27 MED ORDER — INDOMETHACIN 50 MG RE SUPP
100.0000 mg | Freq: Once | RECTAL | Status: AC
  Administered 2014-11-27: 100 mg via RECTAL

## 2014-11-27 MED ORDER — LIDOCAINE HCL (CARDIAC) 20 MG/ML IV SOLN
INTRAVENOUS | Status: DC | PRN
  Administered 2014-11-27: 100 mg via INTRAVENOUS

## 2014-11-27 MED ORDER — INDOMETHACIN 50 MG RE SUPP
RECTAL | Status: AC
  Filled 2014-11-27: qty 1

## 2014-11-27 MED ORDER — ONDANSETRON HCL 4 MG/2ML IJ SOLN: 4.0000 mg | Freq: Once | INTRAMUSCULAR | Status: DC | PRN

## 2014-11-27 MED ORDER — SODIUM CHLORIDE 0.9 % IV SOLN
INTRAVENOUS | Status: DC | PRN
  Administered 2014-11-27: 40 mL

## 2014-11-27 MED ORDER — LACTATED RINGERS IV SOLN
INTRAVENOUS | Status: DC
  Administered 2014-11-27: 1000 mL via INTRAVENOUS

## 2014-11-27 MED ORDER — DEXTROSE 5 % IV SOLN: 1.0000 g | Freq: Once | INTRAVENOUS | Status: DC

## 2014-11-27 MED ORDER — PHENYLEPHRINE HCL 10 MG/ML IJ SOLN
INTRAMUSCULAR | Status: DC | PRN
  Administered 2014-11-27 (×3): 40 ug via INTRAVENOUS

## 2014-11-27 MED ORDER — MEPERIDINE HCL 100 MG/ML IJ SOLN: 6.2500 mg | INTRAMUSCULAR | Status: DC | PRN

## 2014-11-27 MED ORDER — PROPOFOL 10 MG/ML IV BOLUS
INTRAVENOUS | Status: AC
  Filled 2014-11-27: qty 20

## 2014-11-27 MED ORDER — LIDOCAINE HCL 1 % IJ SOLN
INTRAMUSCULAR | Status: AC
  Filled 2014-11-27: qty 40

## 2014-11-27 MED ORDER — LIDOCAINE HCL (CARDIAC) 20 MG/ML IV SOLN
INTRAVENOUS | Status: AC
  Filled 2014-11-27: qty 5

## 2014-11-27 MED ORDER — ONDANSETRON HCL 4 MG/2ML IJ SOLN
INTRAMUSCULAR | Status: DC | PRN
Start: 2014-11-27 — End: 2014-11-27
  Administered 2014-11-27: 4 mg via INTRAVENOUS

## 2014-11-27 MED ORDER — EPHEDRINE SULFATE 50 MG/ML IJ SOLN
INTRAMUSCULAR | Status: DC | PRN
  Administered 2014-11-27: 10 mg via INTRAVENOUS
  Administered 2014-11-27 (×2): 5 mg via INTRAVENOUS

## 2014-11-27 MED ORDER — PROPOFOL 10 MG/ML IV BOLUS
INTRAVENOUS | Status: DC | PRN
  Administered 2014-11-27: 130 mg via INTRAVENOUS

## 2014-11-27 MED ORDER — HYDROMORPHONE HCL 1 MG/ML IJ SOLN: 0.2500 mg | INTRAMUSCULAR | Status: DC | PRN

## 2014-11-27 MED ORDER — GLUCAGON HCL RDNA (DIAGNOSTIC) 1 MG IJ SOLR
INTRAMUSCULAR | Status: AC
  Filled 2014-11-27: qty 1

## 2014-11-27 NOTE — Op Note (Signed)
[facility] [place], [address]   ERCP PROCEDURE REPORT  PATIENT: [name], [name]  MR# :[id number] BIRTHDATE: November 24, 1927  GENDER: male ENDOSCOPIST: Clarene Essex, MD REFERRED BY: Burney Gauze, M.D. PROCEDURE DATE:  11/27/2014 PROCEDURE:   ERCP with removal of calculus/calculi , ERCP with sphincterotomy/papillotomy, and ERCP with balloon dilation ASA CLASS:    2 INDICATIONS: CBD stones MEDICATIONS:    general anesthesia TOPICAL ANESTHETIC:  none  DESCRIPTION OF PROCEDURE:   After the risks benefits and alternatives of the procedure were thoroughly explained, informed consent was obtained.  The ercp pentax [id number]  endoscope was introduced through the mouth and advanced to the second portion of the duodenum .  normal ampulla with a tiny periampullary diverticuliwas brought into viewand using the triple lumen sphincterotome loaded with the JAG Jagwire we initially could only get the wire to go towards the pancreas but could not get it to go deep into the pancreas and aftera moderate effortwe switched to this smaller sphincterotome and wire and deep selective cannulation was obtained and dye was injected and there did seem to be some tiny stones and there was no pancreatic duct injection throughout the procedureand we proceeded with a medium size sphincterotomy in the customary fashion until we had adequate biliary drainage and could get the fully bowed sphincterotome easily in and out of the duct and we proceeded with multiple balloon pull-through's using the adjustable 12 mm balloon which passed fairly readily through the patent sphincterotomy siteand a few tiny stones and sludge was removedand we proceeded with a occlusion cholangiogram in the customary fashionwhich did not reveal any residual abnormalities however he did have some sluggish drainage at the end of the procedure so we elected to balloon dilate the ampulla and distal ductinstead of  enlarging the sphincterotomy and we used the 4 cm x 8 mm balloon and inflated it for 1 minute in the customary fashion and there was excellent biliary drainage at the end of the procedure and the wire and the balloon were removed and the patient tolerated the procedure well and the scope was removedand there was no obvious immediate complicationEstimated blood loss is zero unless otherwise noted in this procedure report.       COMPLICATIONS: none  ENDOSCOPIC IMPRESSION:1.Normal ampulla with tiny periampullary diverticuli2.Few pancreatic duct wire advancement's but not deep and no injection of contrast into the pancreas3.Few tiny CBD stones n sludge removed as above with sphincterotomy and balloon pull-through4. Sluggish emptying at the end of the procedure status post balloon dilation as above  RECOMMENDATIONS:observe for delayed complications if none we'll see back in the office in a week or 2 to discuss seeing how it goes versus surgical consultation for cholecystectomy and will leave restarting Coumadin to Dr. Marin Olp and his primary care physician but okay with me to resume in 3 days if doing well and call me sooner when necessary     _______________________________ eSigned:  Clarene Essex, MD 11/27/2014 1:55 PM   FT:DDUKG Marin Olp, MD

## 2014-11-27 NOTE — Transfer of Care (Signed)
Immediate Anesthesia Transfer of Care Note  Patient: [name]  Procedure(s) Performed: Procedure(s): ENDOSCOPIC RETROGRADE CHOLANGIOPANCREATOGRAPHY (ERCP) (N/A)  Patient Location: ENDO  Anesthesia Type:MAC  Level of Consciousness:  sedated, patient cooperative and responds to stimulation  Airway & Oxygen Therapy:Patient Spontanous Breathing and Patient connected to face mask oxgen  Post-op Assessment:  Report given to ENDO RN and Post -op Vital signs reviewed and stable  Post vital signs:  Reviewed and stable  Last Vitals:  Filed Vitals:   11/27/14 1208  BP: 121/72  Temp: 36.6 C  Resp: 9    Complications: No apparent anesthesia complications

## 2014-11-27 NOTE — Anesthesia Postprocedure Evaluation (Signed)
Anesthesia Post Note  Patient: [name]  Procedure(s) Performed: Procedure(s) (LRB): ENDOSCOPIC RETROGRADE CHOLANGIOPANCREATOGRAPHY (ERCP) (N/A)  Anesthesia type: MAC  Patient location: PACU  Post pain: Pain level controlled  Post assessment: Patient's Cardiovascular Status Stable  Last Vitals:  Filed Vitals:   11/27/14 1420  BP:   Pulse: 69  Temp:   Resp: 13    Post vital signs: Reviewed and stable  Level of consciousness: sedated  Complications: No apparent anesthesia complications

## 2014-11-27 NOTE — Discharge Instructions (Addendum)
Call if question or problem otherwise follow-up in the office in one to 2 weeks to discuss possibly having your gallbladder out versus seeing how it goes and okay with me to resume Coumadin if still needed in 3 days as per Dr. Marin Olp and may have clear liquids until 6 PM and if doing well may have soft solids this evening and advance diet tomorrow if doing well and specifically call if increased pain nausea vomiting fever or signs of GI bleeding or black diarrhea or yellow jaundiceEndoscopic Retrograde Cholangiopancreatography (ERCP), Care After Refer to this sheet in the next few weeks. These instructions provide you with information on caring for yourself after your procedure. Your health care provider may also give you more specific instructions. Your treatment has been planned according to current medical practices, but problems sometimes occur. Call your health care provider if you have any problems or questions after your procedure.  WHAT TO EXPECT AFTER THE PROCEDURE  After your procedure, it is typical to feel:   Soreness in your throat.   Sick to your stomach (nauseous).   Bloated.  Dizzy.   Fatigued. HOME CARE INSTRUCTIONS  Have a friend or family member stay with you for the first 24 hours after your procedure.  Start taking your usual medicines and eating normally as soon as you feel well enough to do so or as directed by your health care provider. SEEK MEDICAL CARE IF:  You have abdominal pain.   You develop signs of infection, such as:   Chills.   Feeling unwell.  SEEK IMMEDIATE MEDICAL CARE IF:  You have difficulty swallowing.  You have worsening throat, chest, or abdominal pain.  You vomit.  You have bloody or very black stools.  You have a fever.   This information is not intended to replace advice given to you by your health care provider. Make sure you discuss any questions you have with your health care provider.   Document Released: 10/26/2012  Document Reviewed: 10/26/2012 Elsevier Interactive Patient Education [employer].

## 2014-11-27 NOTE — Anesthesia Preprocedure Evaluation (Signed)
Anesthesia Evaluation  Patient identified by MRN, date of birth, ID band Patient awake    Reviewed: Allergy & Precautions, NPO status , Patient's Chart, lab work & pertinent test results  Airway Mallampati: I  TM Distance: >3 FB Neck ROM: Full    Dental   Pulmonary former smoker,    Pulmonary exam normal        Cardiovascular Normal cardiovascular exam+ dysrhythmias + pacemaker      Neuro/Psych    GI/Hepatic GERD  Medicated and Controlled,  Endo/Other    Renal/GU CRFRenal disease     Musculoskeletal   Abdominal   Peds  Hematology  (+) anemia ,   Anesthesia Other Findings   Reproductive/Obstetrics                             Anesthesia Physical Anesthesia Plan  ASA: III  Anesthesia Plan: General   Post-op Pain Management:    Induction: Intravenous  Airway Management Planned: Oral ETT  Additional Equipment:   Intra-op Plan:   Post-operative Plan: Extubation in OR  Informed Consent: I have reviewed the patients History and Physical, chart, labs and discussed the procedure including the risks, benefits and alternatives for the proposed anesthesia with the patient or authorized representative who has indicated his/her understanding and acceptance.     Plan Discussed with: CRNA and Surgeon  Anesthesia Plan Comments:         Anesthesia Quick Evaluation

## 2014-11-27 NOTE — Progress Notes (Signed)
[name] 12:38 PM  Subjective: Patient asymptomatic no problems since we last saw him and we rediscussed the procedure  Objective: Vital signs stable afebrile no acute distress exam please see preassessment evaluation liver tests back to normal last week  Assessment: CBD stones  Plan: Okay to proceed with ERCP with anesthesia assistance  [facility]  Pager [phone] After 5PM or if no answer call [phone]

## 2014-11-27 NOTE — Anesthesia Procedure Notes (Signed)
Procedure Name: Intubation Date/Time: 11/27/2014 12:49 PM Performed by: Montel Clock Pre-anesthesia Checklist: Patient identified, Emergency Drugs available, Suction available, Patient being monitored and Timeout performed Patient Re-evaluated:Patient Re-evaluated prior to inductionOxygen Delivery Method: Circle system utilized Preoxygenation: Pre-oxygenation with 100% oxygen Intubation Type: IV induction Ventilation: Mask ventilation without difficulty Laryngoscope Size: Mac and 3 Grade View: Grade I Tube type: Oral Tube size: 7.5 mm Number of attempts: 1 Airway Equipment and Method: Stylet Placement Confirmation: ETT inserted through vocal cords under direct vision,  positive ETCO2 and breath sounds checked- equal and bilateral Secured at: 23 cm Tube secured with: Tape Dental Injury: Teeth and Oropharynx as per pre-operative assessment

## 2014-11-28 ENCOUNTER — Encounter (HOSPITAL_COMMUNITY): Payer: Self-pay | Admitting: Gastroenterology

## 2014-12-06 ENCOUNTER — Telehealth: Payer: Self-pay | Admitting: Family Medicine

## 2014-12-06 NOTE — Telephone Encounter (Signed)
Ok to give Tdap and Prevnar with upcoming appt

## 2014-12-06 NOTE — Telephone Encounter (Signed)
Pt is scheduled for coumadin check 12/11/14 and would like the pneumonia shot and a tetanus shot. Please advise if not approved.

## 2014-12-07 NOTE — Telephone Encounter (Signed)
Left msg to notify pt °

## 2014-12-10 ENCOUNTER — Ambulatory Visit (INDEPENDENT_AMBULATORY_CARE_PROVIDER_SITE_OTHER): Payer: Medicare Other

## 2014-12-10 DIAGNOSIS — Z950 Presence of cardiac pacemaker: Secondary | ICD-10-CM

## 2014-12-10 DIAGNOSIS — I5022 Chronic systolic (congestive) heart failure: Secondary | ICD-10-CM

## 2014-12-11 ENCOUNTER — Ambulatory Visit (INDEPENDENT_AMBULATORY_CARE_PROVIDER_SITE_OTHER): Payer: Medicare Other | Admitting: Behavioral Health

## 2014-12-11 DIAGNOSIS — Z23 Encounter for immunization: Secondary | ICD-10-CM | POA: Diagnosis not present

## 2014-12-11 DIAGNOSIS — I4891 Unspecified atrial fibrillation: Secondary | ICD-10-CM | POA: Diagnosis not present

## 2014-12-11 LAB — POCT INR: INR: 3.1

## 2014-12-11 NOTE — Progress Notes (Signed)
EPIC Encounter for ICM Monitoring  Patient Name: [name] is a 81 y.o. male Date: 12/11/2014 Primary Care Physican: Penni Homans, MD Primary Cardiologist: Mare Ferrari Electrophysiologist: Allred Dry Weight: 202 lbs  Bi-V Pacing 97.4%       In the past month, have you:  1. Gained more than 2 pounds in a day or more than 5 pounds in a week? no  2. Had changes in your medications (with verification of current medications)? no  3. Had more shortness of breath than is usual for you? no  4. Limited your activity because of shortness of breath? no  5. Not been able to sleep because of shortness of breath? no  6. Had increased swelling in your feet or ankles? no  7. Had symptoms of dehydration (dizziness, dry mouth, increased thirst, decreased urine output) no  8. Had changes in sodium restriction? no  9. Been compliant with medication? Yes   ICM trend: 12/10/2014   Follow-up plan: ICM clinic phone appointment on 01/11/2015.  Optivol thoracic impedance below baseline ~11/26/2014 to 12/10/2014 suggesting fluid retention.  He denied any symptoms.  He reported he has had a couple of procedures in the last 2 weeks for gallbladder stones.  Fluid retention could be related to the procedures.  Advised to limit sodium intake to 2000mg  daily and fluid intake to 64 oz.  No changes today.    Copy of note sent to patient's primary care physician, primary cardiologist, and device following physician.  Rosalene Billings, RN, CCM 12/11/2014 1:00 PM

## 2014-12-11 NOTE — Progress Notes (Signed)
Pre visit review using our clinic review tool, if applicable. No additional management support is needed unless otherwise documented below in the visit note. 

## 2014-12-11 NOTE — Patient Instructions (Signed)
Per Dr. Charlett Blake: Continue to take 5 mg daily. Recheck INR in 1 week.

## 2014-12-18 ENCOUNTER — Ambulatory Visit (INDEPENDENT_AMBULATORY_CARE_PROVIDER_SITE_OTHER): Payer: Medicare Other

## 2014-12-18 DIAGNOSIS — I4891 Unspecified atrial fibrillation: Secondary | ICD-10-CM

## 2014-12-18 LAB — POCT INR: INR: 3.6

## 2014-12-18 NOTE — Progress Notes (Signed)
Pre visit review using our clinic review tool, if applicable. No additional management support is needed unless otherwise documented below in the visit note.  PAtient in for INR check. INR= 3.6 Dx: Atrial Fibrillationl  Patient to return in 1 week for recheck

## 2014-12-18 NOTE — Patient Instructions (Addendum)
Per Dr. Charlett Blake , Skip next dose. Take 5 mg on Tuesday Thursday,Saturday and Sunday. Take 2.5 mg on Monday, Wednesday, and Friday. Return in 1 week for Coumadin Recheck.

## 2014-12-25 ENCOUNTER — Telehealth: Payer: Self-pay | Admitting: Family Medicine

## 2014-12-25 ENCOUNTER — Ambulatory Visit (INDEPENDENT_AMBULATORY_CARE_PROVIDER_SITE_OTHER): Payer: Medicare Other | Admitting: Behavioral Health

## 2014-12-25 DIAGNOSIS — I4891 Unspecified atrial fibrillation: Secondary | ICD-10-CM

## 2014-12-25 LAB — POCT INR: INR: 2.1

## 2014-12-25 NOTE — Telephone Encounter (Signed)
Caller name: Self   Can be reached:[phone]    Reason for call: Patient needs to know how to take Coumadin. Took 5mg  today

## 2014-12-25 NOTE — Telephone Encounter (Signed)
Informed the patient, that per Dr. Charlett Blake: Continue to take 5 mg on Tuesday, Thursday, Saturday and Sunday. Take 2.5 mg on Monday, Wednesday, and Friday. Return in 1 month for INR check. Patient voiced understanding and did not have any further questions or concerns prior to the end of call. Next appointment scheduled for 01/25/15 at 10:00 AM.

## 2014-12-25 NOTE — Patient Instructions (Signed)
Per Dr. Charlett Blake: Continue to take 5 mg on Tuesday Thursday,Saturday and Sunday. Take 2.5 mg on Monday, Wednesday, and Friday. Return in 1 month for INR check.

## 2014-12-25 NOTE — Progress Notes (Signed)
Reviewed

## 2014-12-25 NOTE — Progress Notes (Signed)
Pre visit review using our clinic review tool, if applicable. No additional management support is needed unless otherwise documented below in the visit note. 

## 2014-12-31 ENCOUNTER — Telehealth: Payer: Self-pay | Admitting: Family Medicine

## 2014-12-31 MED ORDER — WARFARIN SODIUM 5 MG PO TABS: ORAL_TABLET | ORAL | Status: DC

## 2014-12-31 NOTE — Telephone Encounter (Signed)
Caller name: Self   Can be reached: [phone]  Pharmacy:  [facility] [address] ([address]), [place] [phone] (Phone) [phone] (Fax)         Reason for call: warfarin (COUMADIN) 5 MG tablet [id number]

## 2014-12-31 NOTE — Telephone Encounter (Signed)
Refill done.  

## 2015-01-01 ENCOUNTER — Ambulatory Visit: Payer: Medicare Other | Admitting: Hematology & Oncology

## 2015-01-01 ENCOUNTER — Other Ambulatory Visit (HOSPITAL_BASED_OUTPATIENT_CLINIC_OR_DEPARTMENT_OTHER): Payer: Medicare Other

## 2015-01-01 DIAGNOSIS — D4620 Refractory anemia with excess of blasts, unspecified: Secondary | ICD-10-CM

## 2015-01-01 LAB — CBC WITH DIFFERENTIAL (CANCER CENTER ONLY)
HCT: 32.9 % — ABNORMAL LOW (ref 38.7–49.9)
HEMOGLOBIN: 10.6 g/dL — AB (ref 13.0–17.1)
MCH: 30.5 pg (ref 28.0–33.4)
MCHC: 32.2 g/dL (ref 32.0–35.9)
MCV: 95 fL (ref 82–98)
Platelets: 106 10*3/uL — ABNORMAL LOW (ref 145–400)
RBC: 3.48 10*6/uL — ABNORMAL LOW (ref 4.20–5.70)
RDW: 15.1 % (ref 11.1–15.7)
WBC: 4.7 10*3/uL (ref 4.0–10.0)

## 2015-01-01 LAB — MANUAL DIFFERENTIAL (CHCC SATELLITE)
ALC: 1.8 10*3/uL (ref 0.9–3.3)
ANC (CHCC MAN DIFF): 1.9 10*3/uL (ref 1.5–6.5)
BASO: 1 % (ref 0–2)
LYMPH: 37 % (ref 14–48)
MONO: 21 % — ABNORMAL HIGH (ref 0–13)
PLT EST ~~LOC~~: DECREASED
SEG: 41 % (ref 40–75)

## 2015-01-01 LAB — RETICULOCYTES
ABS RETIC: 14.2 10*3/uL — AB (ref 19.0–186.0)
RBC.: 3.55 MIL/uL — AB (ref 4.22–5.81)
RETIC CT PCT: 0.4 % (ref 0.4–2.3)

## 2015-01-01 LAB — IRON AND TIBC
%SAT: 51 % (ref 20–55)
IRON: 95 ug/dL (ref 42–163)
TIBC: 186 ug/dL — ABNORMAL LOW (ref 202–409)
UIBC: 91 ug/dL — ABNORMAL LOW (ref 117–376)

## 2015-01-01 LAB — FERRITIN: Ferritin: 1481 ng/ml — ABNORMAL HIGH (ref 22–316)

## 2015-01-08 ENCOUNTER — Encounter: Payer: Self-pay | Admitting: Hematology & Oncology

## 2015-01-08 ENCOUNTER — Ambulatory Visit (HOSPITAL_BASED_OUTPATIENT_CLINIC_OR_DEPARTMENT_OTHER): Payer: Medicare Other | Admitting: Hematology & Oncology

## 2015-01-08 VITALS — BP 118/68 | HR 81 | Temp 98.0°F | Resp 16 | Ht 75.0 in | Wt 207.0 lb

## 2015-01-08 DIAGNOSIS — D4620 Refractory anemia with excess of blasts, unspecified: Secondary | ICD-10-CM

## 2015-01-08 DIAGNOSIS — I482 Chronic atrial fibrillation: Secondary | ICD-10-CM | POA: Diagnosis not present

## 2015-01-08 NOTE — Progress Notes (Signed)
He is  Hematology and Oncology Follow Up Visit  [name] [id number] 08/03/1927 81 y.o. 01/08/2015   Principle Diagnosis:   Refractory anemia-low risk by R-IPSS  Chronic atrial fibrillation  Current Therapy:    Coumadin-lifelong   Aranesp 300 mcg subcutaneous for hemoglobin less than 10     Interim History:  Mr.  Marks is back for followup.  He is feeling quite good. We last saw him back in September.  His family doctor, Dr. Randel Pigg,  Now is following his Coumadin. I very much appreciate  Her help on this.   He has had no problems with fever. He's had no bleeding. He's on Coumadin. He's had no cardiac issues.   He's had no change in bowel or bladder habits.   He has not noted any leg swelling.   There's been no cough or shortness of breath. He's had no arthritic issues.   We are watching his iron levels. His last ferritin was 1481  With an iron saturation of 51%. I still think we are okay and that we do not have to do any iron chelation.   His appetite has been good. He's had no nausea or vomiting.   Overall, his performance status is ECOG 1.  Medications:  Current outpatient prescriptions:  .  allopurinol (ZYLOPRIM) 300 MG tablet, Take 300 mg by mouth at bedtime. , Disp: , Rfl:  .  carvedilol (COREG) 6.25 MG tablet, Take 1 tablet (6.25 mg total) by mouth 2 (two) times daily with a meal., Disp: 180 tablet, Rfl: 1 .  doxazosin (CARDURA) 4 MG tablet, Take 4 mg by mouth at bedtime. , Disp: , Rfl:  .  famotidine (PEPCID) 40 MG tablet, Take 80 mg by mouth daily. , Disp: , Rfl:  .  finasteride (PROSCAR) 5 MG tablet, Take 5 mg by mouth every evening. , Disp: , Rfl:  .  furosemide (LASIX) 40 MG tablet, TAKE ONE TABLET BY MOUTH ONCE DAILY., Disp: 90 tablet, Rfl: 3 .  Multiple Vitamin (MULTIVITAMIN WITH MINERALS) TABS tablet, Take 1 tablet by mouth daily., Disp: , Rfl:  .  pyridoxine (B-6) 100 MG tablet, Take 200 mg by mouth daily. , Disp: , Rfl:  .  warfarin (COUMADIN) 5  MG tablet, TAKE ONE TABLET BY MOUTH ONCE DAILY AT SUPPER EXCEPT ON SUNDAY, Disp: 90 tablet, Rfl: 0  Allergies:  Allergies  Allergen Reactions  . Methylprednisolone Other (See Comments)    Dizziness, passed out twice  . Moxifloxacin Hcl In Nacl Other (See Comments) and Itching    Pt c/o being "out of his head" & itching  . Ramipril Other (See Comments)    Renal function worsened on ACEi.    Past Medical History, Surgical history, Social history, and Family History were reviewed and updated.  Review of Systems: As above  Physical Exam:  height is 6\' 3"  (1.905 m) and weight is 207 lb (93.895 kg). His oral temperature is 98 F (36.7 C). His blood pressure is 118/68 and his pulse is 81. His respiration is 16.   Well-developed and well-nourished white woman. Head and neck exam shows no ocular or oral lesion.  He has no palpable cervical or supraclavicular lymph nodes. Lungs are clear. He does arouse, wheezes or rhonchi. Cardiac exam is regular rate and rhythm. He has an occasional extra beat. His he has a 1/6 systolic murmur. Abdomen is soft. He has good bowel sounds. There is no fluid wave. There is no palpable liver or spleen tip.  Extremities shows no clubbing, cyanosis or edema. His compression stockings on his legs. Skin exam shows no rashes, ecchymosis or petechia. Neurological exam shows no focal neurological deficits.  Lab Results  Component Value Date   WBC 4.7 01/01/2015   HGB 10.6* 01/01/2015   HCT 32.9* 01/01/2015   MCV 95 01/01/2015   PLT 106* 01/01/2015     Chemistry      Component Value Date/Time   NA 139 11/21/2014 1312   NA 146* 05/07/2014 1020   K 4.2 11/21/2014 1312   K 4.0 05/07/2014 1020   CL 107 11/21/2014 1312   CL 106 05/07/2014 1020   CO2 29 11/21/2014 1312   CO2 29 05/07/2014 1020   BUN 19 11/21/2014 1312   BUN 21 05/07/2014 1020   CREATININE 1.50* 11/21/2014 1312   CREATININE 1.7* 05/07/2014 1020      Component Value Date/Time   CALCIUM 8.5*  11/21/2014 1312   CALCIUM 9.4 05/07/2014 1020   ALKPHOS 97 11/21/2014 1312   ALKPHOS 76 05/07/2014 1020   AST 24 11/21/2014 1312   AST 27 05/07/2014 1020   ALT 40 11/21/2014 1312   ALT 19 05/07/2014 1020   BILITOT 1.9* 11/21/2014 1312   BILITOT 1.40 05/07/2014 1020         Impression and Plan: [name] is a 81 year old gentleman. He has refractory anemia. We do not have to give him any Aranesp. He's done incredibly well with this.  On his blood smear, I do not see any evidence of transition over to leukemia.   we will have another appointment with him in 3 months.   we are watching his iron studies.   [name], MD 12/20/201610:48 AM

## 2015-01-11 ENCOUNTER — Telehealth: Payer: Self-pay | Admitting: Cardiology

## 2015-01-11 ENCOUNTER — Ambulatory Visit (INDEPENDENT_AMBULATORY_CARE_PROVIDER_SITE_OTHER): Payer: Medicare Other

## 2015-01-11 DIAGNOSIS — I5022 Chronic systolic (congestive) heart failure: Secondary | ICD-10-CM | POA: Diagnosis not present

## 2015-01-11 DIAGNOSIS — Z95810 Presence of automatic (implantable) cardiac defibrillator: Secondary | ICD-10-CM | POA: Diagnosis not present

## 2015-01-11 NOTE — Telephone Encounter (Signed)
Confirmed remote transmission w/ pt wife.   

## 2015-01-11 NOTE — Progress Notes (Signed)
EPIC Encounter for ICM Monitoring  Patient Name: VLADIMIR MICHONSKI is a 81 y.o. male Date: 01/11/2015 Primary Care Physican: Penni Homans, MD Primary Cardiologist: Mare Ferrari Electrophysiologist: Allred Dry Weight: 202 lbs  Bi-V Pacing 97.9%       In the past month, have you:  1. Gained more than 2 pounds in a day or more than 5 pounds in a week? no  2. Had changes in your medications (with verification of current medications)? no  3. Had more shortness of breath than is usual for you? no  4. Limited your activity because of shortness of breath? no  5. Not been able to sleep because of shortness of breath? no  6. Had increased swelling in your feet or ankles? no  7. Had symptoms of dehydration (dizziness, dry mouth, increased thirst, decreased urine output) no  8. Had changes in sodium restriction? no  9. Been compliant with medication? Yes   ICM trend: 01/11/2015   Follow-up plan: ICM clinic phone appointment 02/13/2015.   Optivol impedance below baseline line 11/28/2014 to 12/19/2014, 12/23/2014 to 01/06/2015 suggesting fluid retention.  He denied any symptoms.  He thinks it could be due to holiday foods.  Impedance back to baseline 01/07/2015.  Advised to resume low sodium diet.  No changes today.   Copy of note sent to patient's primary care physician, primary cardiologist, and device following physician.  Rosalene Billings, RN, CCM 01/11/2015 10:20 AM

## 2015-01-17 ENCOUNTER — Telehealth: Payer: Self-pay | Admitting: Family Medicine

## 2015-01-17 MED ORDER — ALLOPURINOL 300 MG PO TABS: 300.0000 mg | ORAL_TABLET | Freq: Every day | ORAL | Status: DC

## 2015-01-17 NOTE — Telephone Encounter (Signed)
Relation to [id number] Call back number:[phone] Pharmacy:  [facility] [address] ([address]), [place] [id number] (Phone) [phone] (Fax)           Reason for call:  Patient requesting a refill allopurinol (ZYLOPRIM) 300 MG tablet

## 2015-01-17 NOTE — Telephone Encounter (Signed)
Rx sent 

## 2015-01-25 ENCOUNTER — Ambulatory Visit (INDEPENDENT_AMBULATORY_CARE_PROVIDER_SITE_OTHER): Payer: Medicare Other

## 2015-01-25 DIAGNOSIS — I4891 Unspecified atrial fibrillation: Secondary | ICD-10-CM

## 2015-01-25 LAB — POCT INR: INR: 3.4

## 2015-01-25 NOTE — Progress Notes (Signed)
Pre visit review using our clinic review tool, if applicable. No additional management support is needed unless otherwise documented below in the visit note.  Patient in for INR check today. INR = 3.4 Goal = 2.0-3.0  Patient states he has been feeling fine, no ABT,no bleeding,minimal bruising. States he has not missed any doses. No diet changes  Patient to return in 2 weeks after medication adjustment.

## 2015-01-25 NOTE — Patient Instructions (Signed)
Per Dr. Charlett Blake: Skip today's dose then take 5 mg on  Saturday and Sunday and 2.5 mg on all other days. Return to office for re-check on 02/08/2015 at 9:45 A.M

## 2015-02-08 ENCOUNTER — Ambulatory Visit (INDEPENDENT_AMBULATORY_CARE_PROVIDER_SITE_OTHER): Payer: Medicare Other | Admitting: Family Medicine

## 2015-02-08 DIAGNOSIS — I4891 Unspecified atrial fibrillation: Secondary | ICD-10-CM

## 2015-02-08 LAB — POCT INR: INR: 1.7

## 2015-02-08 NOTE — Progress Notes (Signed)
Pre visit review using our clinic review tool, if applicable. No additional management support is needed unless otherwise documented below in the visit note.  INR today 1.7. Pt reports he has been taking Coumadin as directed and has no missed doses, no dietary changes, and no antibiotic use. Pt requested to be notified of any changes via telephone as we he was unable to wait in the office.  Per Dr. Charlett Blake, pt is to start taking 5 mg on Saturday, Sunday, and Wednesday and 2.5 mg every other day and return for INR check in 2 weeks.   I called the pt and notified him of changes. Pt verbalized understanding and repeated instructions back to me x2.   Next INR check scheduled for 02/22/2015 @ 9:30am.  Dorrene German, RN

## 2015-02-08 NOTE — Patient Instructions (Signed)
Per Dr. Charlett Blake:  Take 5 mg on  Saturday, Sunday, and Wednesday. Take 2.5 mg on all other days. Recheck INR in 2 weeks.

## 2015-02-13 ENCOUNTER — Ambulatory Visit (INDEPENDENT_AMBULATORY_CARE_PROVIDER_SITE_OTHER): Payer: Medicare Other | Admitting: *Deleted

## 2015-02-13 DIAGNOSIS — Z95810 Presence of automatic (implantable) cardiac defibrillator: Secondary | ICD-10-CM

## 2015-02-13 DIAGNOSIS — I5022 Chronic systolic (congestive) heart failure: Secondary | ICD-10-CM | POA: Diagnosis not present

## 2015-02-13 NOTE — Progress Notes (Signed)
Remote pacemaker transmission.   

## 2015-02-14 NOTE — Progress Notes (Addendum)
EPIC Encounter for ICM Monitoring  Patient Name: [name] is a 82 y.o. male Date: 02/14/2015 Primary Care Physican: Penni Homans, MD Primary Cardiologist: Mare Ferrari Electrophysiologist: Allred Dry Weight: 204 lbs  Bi-V Pacing 95.2% (97.9% on 01/11/2015)       In the past month, have you:  1. Gained more than 2 pounds in a day or more than 5 pounds in a week? no  2. Had changes in your medications (with verification of current medications)? no  3. Had more shortness of breath than is usual for you? no  4. Limited your activity because of shortness of breath? no  5. Not been able to sleep because of shortness of breath? no  6. Had increased swelling in your feet or ankles? no  7. Had symptoms of dehydration (dizziness, dry mouth, increased thirst, decreased urine output) no  8. Had changes in sodium restriction? no  9. Been compliant with medication? Yes   ICM trend: 3 month view for 02/13/2015   ICM trend: 1 year view for 02/13/2015   Follow-up plan: ICM clinic phone appointment 03/19/2015 and office appointment with Dr Mare Ferrari 03/11/2015.  Optivol thoracic impedance trending below baseline 02/12/2015 suggesting start of fluid retention.  He has gained 1 lb overnight and advised to limit salt intake.  Encouraged him to call if he has any further weight gain or other fluid retention symptoms.  No changes today.     Copy of note sent to patient's primary care physician, primary cardiologist, and device following physician.  Rosalene Billings, RN, CCM 02/14/2015 3:36 PM

## 2015-02-22 ENCOUNTER — Ambulatory Visit (INDEPENDENT_AMBULATORY_CARE_PROVIDER_SITE_OTHER): Payer: Medicare Other | Admitting: *Deleted

## 2015-02-22 DIAGNOSIS — I4891 Unspecified atrial fibrillation: Secondary | ICD-10-CM

## 2015-02-22 LAB — POCT INR: INR: 1.6

## 2015-02-22 NOTE — Patient Instructions (Signed)
Per Dr. Charlett Blake: Take 2.5 mg (0.5 tab) on Monday, Wednesday, and Friday. Take 5 mg (1 tab) all other days. Recheck INR in 2-3 weeks.

## 2015-02-22 NOTE — Progress Notes (Signed)
Pre visit review using our clinic review tool, if applicable. No additional management support is needed unless otherwise documented below in the visit note.  INR today 1.6. Pt has been taking 5 mg Friday, Saturday, and Sunday and 2.5 mg all other days instead of taking 5 mg Saturday, Sunday, and Wednesday as prescribed. Pt findings otherwise negative. Verified that pt only has 5 mg tablets and no other doses at home. Pt requested to be called w/ changes as he was unable to wait in the office.   Per Dr. Charlett Blake: Take 2.5 mg (0.5 tab) on Monday, Wednesday, and Friday. Take 5 mg (1 tab) all other days. Recheck INR in 2-3 weeks.  Pt called and aware of instructions and verbalized understanding. Pt repeated back dosing instructions x2. Pt has appt w/ Dr. Charlett Blake 03/12/15 and would like to have INR checked at that appt.  Dorrene German, RN

## 2015-02-27 LAB — CUP PACEART REMOTE DEVICE CHECK
Brady Statistic AP VP Percent: 0 %
Brady Statistic AP VS Percent: 0 %
Brady Statistic AS VP Percent: 96.04 %
Brady Statistic AS VS Percent: 3.96 %
Brady Statistic RV Percent Paced: 96.04 %
Date Time Interrogation Session: 20170125202321
Implantable Lead Implant Date: 20081209
Implantable Lead Location: 753858
Lead Channel Impedance Value: 4047 Ohm
Lead Channel Impedance Value: 4047 Ohm
Lead Channel Impedance Value: 4047 Ohm
Lead Channel Impedance Value: 494 Ohm
Lead Channel Impedance Value: 608 Ohm
Lead Channel Impedance Value: 722 Ohm
Lead Channel Sensing Intrinsic Amplitude: 15.625 mV
Lead Channel Sensing Intrinsic Amplitude: 31.625 mV
Lead Channel Sensing Intrinsic Amplitude: 31.625 mV
Lead Channel Setting Pacing Amplitude: 2.5 V
Lead Channel Setting Sensing Sensitivity: 5.6 mV
MDC IDC LEAD IMPLANT DT: 20160323
MDC IDC LEAD LOCATION: 753860
MDC IDC MSMT BATTERY REMAINING LONGEVITY: 54 mo
MDC IDC MSMT BATTERY VOLTAGE: 3 V
MDC IDC MSMT LEADCHNL LV IMPEDANCE VALUE: 4047 Ohm
MDC IDC MSMT LEADCHNL RA IMPEDANCE VALUE: 4047 Ohm
MDC IDC MSMT LEADCHNL RV IMPEDANCE VALUE: 399 Ohm
MDC IDC MSMT LEADCHNL RV PACING THRESHOLD AMPLITUDE: 0.75 V
MDC IDC MSMT LEADCHNL RV PACING THRESHOLD PULSEWIDTH: 0.4 ms
MDC IDC MSMT LEADCHNL RV SENSING INTR AMPL: 15.625 mV
MDC IDC SET LEADCHNL LV PACING AMPLITUDE: 2.5 V
MDC IDC SET LEADCHNL LV PACING PULSEWIDTH: 0.8 ms
MDC IDC SET LEADCHNL RV PACING PULSEWIDTH: 0.4 ms
MDC IDC STAT BRADY RA PERCENT PACED: 0 %

## 2015-03-01 ENCOUNTER — Encounter: Payer: Self-pay | Admitting: Cardiology

## 2015-03-11 ENCOUNTER — Encounter: Payer: Self-pay | Admitting: Cardiology

## 2015-03-11 ENCOUNTER — Telehealth: Payer: Self-pay | Admitting: Behavioral Health

## 2015-03-11 ENCOUNTER — Ambulatory Visit (INDEPENDENT_AMBULATORY_CARE_PROVIDER_SITE_OTHER): Payer: Medicare Other | Admitting: Cardiology

## 2015-03-11 ENCOUNTER — Encounter: Payer: Self-pay | Admitting: Behavioral Health

## 2015-03-11 VITALS — BP 110/50 | HR 80 | Ht 75.0 in | Wt 209.0 lb

## 2015-03-11 DIAGNOSIS — I5022 Chronic systolic (congestive) heart failure: Secondary | ICD-10-CM | POA: Diagnosis not present

## 2015-03-11 DIAGNOSIS — I482 Chronic atrial fibrillation: Secondary | ICD-10-CM

## 2015-03-11 DIAGNOSIS — Z95810 Presence of automatic (implantable) cardiac defibrillator: Secondary | ICD-10-CM | POA: Diagnosis not present

## 2015-03-11 DIAGNOSIS — I4821 Permanent atrial fibrillation: Secondary | ICD-10-CM

## 2015-03-11 NOTE — Addendum Note (Signed)
Addended by: Kathlen Brunswick on: 03/11/2015 05:09 PM   Modules accepted: Medications

## 2015-03-11 NOTE — Telephone Encounter (Signed)
Unable to reach patient at time of Pre-Visit Call.  Left message for patient to return call when available.    

## 2015-03-11 NOTE — Patient Instructions (Addendum)
Medication Instructions:  Your physician recommends that you continue on your current medications as directed. Please refer to the Current Medication list given to you today.  Labwork: none  Testing/Procedures: none  Follow-Up: As planned with Dr Rayann Heman   If you need a refill on your cardiac medications before your next appointment, please call your pharmacy.

## 2015-03-11 NOTE — Telephone Encounter (Signed)
Pre-Visit Call completed with patient and chart updated.   Pre-Visit Info documented in Specialty Comments under SnapShot.    

## 2015-03-11 NOTE — Progress Notes (Signed)
Cardiology Office Note   Date:  03/11/2015   ID:  [name], [name] 1927-08-15, MRN [id number]  PCP:  Penni Homans, MD  Cardiologist: Darlin Coco MD  No chief complaint on file.     History of Present Illness: [name] is a 82 y.o. male who presents for scheduled 6 month follow-up visit.  This pleasant 82 year old gentleman is seen for a scheduled followup office visit. He has a history of chronic congestive heart failure. He was hospitalized for this at [facility] in 2009. He has a past history of established permanent atrial fibrillation and had an ablation of his AV node with implantation of a biventricular pacemaker on 12/28/06 by Dr. Norville Haggard at [name]. He was part of a research study however and the biventricular function was not turned on until recently when he switched his care to our office after the conclusion of the research study. The patient has been on chronic Coumadin for his atrial fibrillation. He gets his Coumadin checked at [facility] office. The patient has a history of depressed left ventricular function. His previous ejection fraction was 45-50%. A repeat echo was done on 03/23/12 to see if turning on the biventricular function has made any difference. Unfortunately the ejection fraction was 25%. However, the patient had a more recent echocardiogram on 01/17/14 which showed that his ejection fraction has risen to 30-35%.  In March 2016 he was found to have right ventricular pacemaker lead fracture. On 04/11/14 he was admitted and received a new right ventricular lead.  Dr. Rayann Heman is his electrophysiologist. Clinically the patient continues to do well. He denies chest pain or shortness of breath. He's had no dizziness or syncope. He is not aware of any palpitations.   Past Medical History  Diagnosis Date  . Bursitis     OF THE RIGHT SHOULDER  . Tinnitus   . Permanent atrial fibrillation (HCC)     s/p AV nodal ablation  .  Depressed ejection fraction 10    EF previously 45-50%, s/p AV nodal ablation and biv pacemaker implant, previously enrolled in Block HF and was randomized to RV pacing only)  . Blood transfusion     "several; Dr. Deberah Pelton" (04/11/2014)  . GERD (gastroesophageal reflux disease)   . MDS (myelodysplastic syndrome), low grade ([place]) 02/10/2011  . Iron overload, transfusional 02/10/2011  . Complete heart block ([place]) 12/28/06    s/p AV nodal ablation and BIV pacemaker implant at [facility] by Dr Rosita Fire  . CHF (congestive heart failure) ([place])   . Chronic bronchitis ([place])     "I keep it"  . H/O hiatal hernia   . Gout   . Presence of permanent cardiac pacemaker   . Chronic anemia     RECIEVES ARANESP SHOTS IF HIS HEMOGLOBIN FALLS BELOW 11  . Bone marrow disorder     "I don't make enough RBCs"  . Arthritis     "hands, back" (04/11/2014)  . Pneumonia     "high school; [employer]; three times since, last time 10/2013" (04/11/2014)  . Gout 08/27/2014  . Preventative health care 09/09/2014    Dr Dorina Hoyer, urology, annually Dr Clover Mealy Dr Sharol Given Dr Ninfa Linden Dr Gershon Crane, opthamology Dr Rayann Heman cardiology Dr Marin Olp   . Kidney stones 1985  . Chronic kidney disease (CKD)     "they work at ~ 30%" (04/11/2014). Dr. Marcelline Deist released pt from office visit last week.    Past Surgical History  Procedure Laterality Date  . Total  knee arthroplasty Bilateral     right Dec.2011,left June 2012  . Joint replacement    . Pilonidal cyst excision  1970's  . Kidney stone surgery  1985    "tried to go up my penis; had to cut my stomach and go in for lodged stone"  . Av node ablation  12/08    at [facility]  . Bunionectomy Bilateral 11/2012- 01/2013    "left-right; great toes; gout and bunion OR" (02/06/2013)  . Inguinal hernia repair Right   . Lithotripsy  1985  . Cataract extraction w/ intraocular lens  implant, bilateral Bilateral   . Permanent pacemaker generator change N/A 04/11/2013    Procedure:  PERMANENT PACEMAKER GENERATOR CHANGE;  Surgeon: Evans Lance, MD;  Location: [facility] CATH LAB;  Service: Cardiovascular;  Laterality: N/A;  . Bi-ventricular pacemaker insertion (crt-p)  2008  . Lead revision  04/11/2014  . Kidney stone surgery  after 1985 kidney stone OR    "put a tube in my back to collect stones"  . Lead revision N/A 04/11/2014    Procedure: LEAD REVISION;  Surgeon: [name], MD;  Location: [facility] CATH LAB;  Service: Cardiovascular;  Laterality: N/A;  . Insert / replace / remove pacemaker  12/28/2006;     MDT CRTP implanted 2008 by Dr Rosita Fire at [facility]; gen change 03/2013 by Dr Lovena Le  . Eus N/A 11/21/2014    Procedure: UPPER ENDOSCOPIC ULTRASOUND (EUS) RADIAL;  Surgeon: Arta Silence, MD;  Location: [facility] ENDOSCOPY;  Service: Endoscopy;  Laterality: N/A;  . Ercp N/A 11/27/2014    Procedure: ENDOSCOPIC RETROGRADE CHOLANGIOPANCREATOGRAPHY (ERCP);  Surgeon: Clarene Essex, MD;  Location: [facility];  Service: Endoscopy;  Laterality: N/A;     Current Outpatient Prescriptions  Medication Sig Dispense Refill  . allopurinol (ZYLOPRIM) 300 MG tablet Take 1 tablet (300 mg total) by mouth at bedtime. 30 tablet 1  . carvedilol (COREG) 6.25 MG tablet Take 1 tablet (6.25 mg total) by mouth 2 (two) times daily with a meal. 180 tablet 1  . doxazosin (CARDURA) 4 MG tablet Take 4 mg by mouth at bedtime.     . famotidine (PEPCID) 40 MG tablet Take 80 mg by mouth daily.     . finasteride (PROSCAR) 5 MG tablet Take 5 mg by mouth every evening.     . furosemide (LASIX) 40 MG tablet TAKE ONE TABLET BY MOUTH ONCE DAILY. 90 tablet 3  . Multiple Vitamin (MULTIVITAMIN WITH MINERALS) TABS tablet Take 1 tablet by mouth daily.    Marland Kitchen pyridoxine (B-6) 100 MG tablet Take 200 mg by mouth daily.     Marland Kitchen warfarin (COUMADIN) 5 MG tablet TAKE ONE TABLET BY MOUTH ONCE DAILY AT SUPPER EXCEPT ON SUNDAY 90 tablet 0   No current facility-administered medications for this visit.    Allergies:   Methylprednisolone;  Moxifloxacin hcl in nacl; and Ramipril    Social History:  The patient  reports that he quit smoking about 47 years ago. His smoking use included Cigarettes. He started smoking about 71 years ago. He has a 25 pack-year smoking history. He has never used smokeless tobacco. He reports that he drinks alcohol. He reports that he does not use illicit drugs.   Family History:  The patient's family history includes COPD in his father; Cancer in his mother; Heart disease in his maternal grandfather; Kidney disease in his brother; Melanoma in his mother. There is no history of Heart attack, Hypertension, or Stroke.    ROS:  Please  see the history of present illness.   Otherwise, review of systems are positive for none.   All other systems are reviewed and negative.    PHYSICAL EXAM: VS:  BP 110/50 mmHg  Pulse 80  Ht 6\' 3"  (1.905 m)  Wt 209 lb (94.802 kg)  BMI 26.12 kg/m2 , BMI Body mass index is 26.12 kg/(m^2). GEN: Well nourished, well developed, in no acute distress HEENT: normal Neck: no JVD, carotid bruits, or masses Cardiac: RRR; no murmurs, rubs, or gallops,no edema  Respiratory:  clear to auscultation bilaterally, normal work of breathing GI: soft, nontender, nondistended, + BS MS: no deformity or atrophy Skin: warm and dry, no rash Neuro:  Strength and sensation are intact Psych: euthymic mood, full affect   EKG:  EKG is not ordered today.    Recent Labs: 08/27/2014: TSH 2.51 11/21/2014: ALT 40; BUN 19; Creatinine, Ser 1.50*; Potassium 4.2; Sodium 139 01/01/2015: HGB 10.6*; Platelets 106*    Lipid Panel    Component Value Date/Time   CHOL 136 08/27/2014 1202   TRIG 70.0 08/27/2014 1202   HDL 49.70 08/27/2014 1202   CHOLHDL 3 08/27/2014 1202   VLDL 14.0 08/27/2014 1202   LDLCALC 72 08/27/2014 1202      Wt Readings from Last 3 Encounters:  03/11/15 209 lb (94.802 kg)  01/08/15 207 lb (93.895 kg)  11/27/14 208 lb (94.348 kg)         ASSESSMENT AND PLAN:  1.  chronic systolic left ventricular dysfunction. Recent ejection fraction 30-35% by echocardiogram 01/17/14.  No new symptoms. 2. chronic permanent atrial fibrillation with previous ablation of AV node and with biventricular pacemaker implanted 12/28/06 at [facility]. Replacement of right ventricular lead on 04/11/14 3. myelodysplastic syndrome followed by Dr. Marin Olp. 4. past history of gout 5. chronic kidney disease 6. Chronic Coumadin anticoagulation followed by Dr. Charlett Blake   Current medicines are reviewed at length with the patient today.  The patient does not have concerns regarding medicines.  The following changes have been made:  no change  Labs/ tests ordered today include:  No orders of the defined types were placed in this encounter.     Disposition:   Continue current medication.  He will follow-up with Dr. Rayann Heman in July as scheduled  Signed, Darlin Coco MD 03/11/2015 11:58 AM    [address], [place], [place]  [address] Phone: [phone]; Fax: [phone]

## 2015-03-12 ENCOUNTER — Encounter: Payer: Self-pay | Admitting: Family Medicine

## 2015-03-12 ENCOUNTER — Ambulatory Visit (INDEPENDENT_AMBULATORY_CARE_PROVIDER_SITE_OTHER): Payer: Medicare Other | Admitting: Family Medicine

## 2015-03-12 VITALS — BP 111/67 | HR 86 | Temp 97.7°F | Ht 75.0 in | Wt 209.0 lb

## 2015-03-12 DIAGNOSIS — Z5181 Encounter for therapeutic drug level monitoring: Secondary | ICD-10-CM

## 2015-03-12 DIAGNOSIS — M109 Gout, unspecified: Secondary | ICD-10-CM | POA: Diagnosis not present

## 2015-03-12 DIAGNOSIS — N289 Disorder of kidney and ureter, unspecified: Secondary | ICD-10-CM

## 2015-03-12 DIAGNOSIS — Z7901 Long term (current) use of anticoagulants: Secondary | ICD-10-CM | POA: Diagnosis not present

## 2015-03-12 DIAGNOSIS — J069 Acute upper respiratory infection, unspecified: Secondary | ICD-10-CM

## 2015-03-12 DIAGNOSIS — Z0000 Encounter for general adult medical examination without abnormal findings: Secondary | ICD-10-CM | POA: Diagnosis not present

## 2015-03-12 DIAGNOSIS — N183 Chronic kidney disease, stage 3 unspecified: Secondary | ICD-10-CM

## 2015-03-12 DIAGNOSIS — I482 Chronic atrial fibrillation: Secondary | ICD-10-CM | POA: Diagnosis not present

## 2015-03-12 DIAGNOSIS — I4821 Permanent atrial fibrillation: Secondary | ICD-10-CM

## 2015-03-12 LAB — COMPREHENSIVE METABOLIC PANEL
ALBUMIN: 4.1 g/dL (ref 3.5–5.2)
ALT: 23 U/L (ref 0–53)
AST: 23 U/L (ref 0–37)
Alkaline Phosphatase: 80 U/L (ref 39–117)
BILIRUBIN TOTAL: 1.1 mg/dL (ref 0.2–1.2)
BUN: 30 mg/dL — ABNORMAL HIGH (ref 6–23)
CALCIUM: 9.5 mg/dL (ref 8.4–10.5)
CO2: 28 mEq/L (ref 19–32)
CREATININE: 1.84 mg/dL — AB (ref 0.40–1.50)
Chloride: 103 mEq/L (ref 96–112)
GFR: 37.28 mL/min — ABNORMAL LOW (ref >60.00)
Glucose, Bld: 94 mg/dL (ref 70–99)
Potassium: 4.1 mEq/L (ref 3.5–5.1)
Sodium: 138 mEq/L (ref 135–145)
Total Protein: 7.6 g/dL (ref 6.0–8.3)

## 2015-03-12 LAB — PROTIME-INR
INR: 2.4 ratio — AB (ref 0.8–1.0)
PROTHROMBIN TIME: 26.3 s — AB (ref 9.6–13.1)

## 2015-03-12 MED ORDER — HYDROCODONE-HOMATROPINE 5-1.5 MG/5ML PO SYRP: 5.0000 mL | ORAL_SOLUTION | Freq: Three times a day (TID) | ORAL | Status: DC | PRN

## 2015-03-12 NOTE — Patient Instructions (Signed)

## 2015-03-12 NOTE — Progress Notes (Deleted)
HPI    Review of Systems        Physical Exam

## 2015-03-18 ENCOUNTER — Telehealth: Payer: Self-pay | Admitting: Family Medicine

## 2015-03-18 MED ORDER — ALLOPURINOL 300 MG PO TABS: 300.0000 mg | ORAL_TABLET | Freq: Every day | ORAL | Status: DC

## 2015-03-18 NOTE — Telephone Encounter (Signed)
Caller name:Self  Can be reached: [phone] Pharmacy:  [facility] [address] ([address]), [place] [id number] (Phone) [phone] (Fax)         Reason for call: Refill on allopurinol (ZYLOPRIM) 300 MG tablet [id number]

## 2015-03-19 ENCOUNTER — Ambulatory Visit (INDEPENDENT_AMBULATORY_CARE_PROVIDER_SITE_OTHER): Payer: Medicare Other

## 2015-03-19 DIAGNOSIS — I5022 Chronic systolic (congestive) heart failure: Secondary | ICD-10-CM

## 2015-03-19 DIAGNOSIS — Z95810 Presence of automatic (implantable) cardiac defibrillator: Secondary | ICD-10-CM

## 2015-03-20 ENCOUNTER — Other Ambulatory Visit: Payer: Self-pay | Admitting: Family Medicine

## 2015-03-20 MED ORDER — ALLOPURINOL 300 MG PO TABS: 300.0000 mg | ORAL_TABLET | Freq: Every day | ORAL | Status: DC

## 2015-03-20 NOTE — Telephone Encounter (Signed)
Rx sent.  Called and made spouse aware that refills have been sent.

## 2015-03-20 NOTE — Telephone Encounter (Signed)
Caller name: Judeth Porch  Relationship to patient: Spouse   Can be reached: [phone]  Pharmacy: [facility] ([place]), [place]  Reason for call: Called in because they are requesting a 90 days supply of his allopurinol Rx.

## 2015-03-21 ENCOUNTER — Other Ambulatory Visit: Payer: Self-pay | Admitting: Cardiology

## 2015-03-21 NOTE — Progress Notes (Signed)
EPIC Encounter for ICM Monitoring  Patient Name: [name] is a 82 y.o. male Date: 03/21/2015 Primary Care Physican: Penni Homans, MD Primary Cardiologist: Mare Ferrari Electrophysiologist: Allred Dry Weight: 202 lbs       Bi-V Pacing 94.6%  In the past month, have you:  1. Gained more than 2 pounds in a day or more than 5 pounds in a week? no  2. Had changes in your medications (with verification of current medications)? no  3. Had more shortness of breath than is usual for you? no  4. Limited your activity because of shortness of breath? no  5. Not been able to sleep because of shortness of breath? no  6. Had increased swelling in your feet or ankles? no  7. Had symptoms of dehydration (dizziness, dry mouth, increased thirst, decreased urine output) no  8. Had changes in sodium restriction? no  9. Been compliant with medication? Yes   ICM trend: 3 month view for 03/19/2015   ICM trend: 1 year view for 03/19/2015   Follow-up plan: ICM clinic phone appointment 05/15/2015.  Optivol thoracic impedance below reference line 02/25/2015 to 02/26/2015 and 03/07/2015 to 03/11/2015 suggesting fluid accumulation.  He reported he is doing well and denied any fluid symptoms.  Thoracic impedance returned to reference line on 03/11/2015.   No changes today.   Encouraged to call for any fluid symptoms.    Copy of note sent to patient's primary care physician, primary cardiologist, and device following physician.  Rosalene Billings, RN, CCM 03/21/2015 12:45 PM

## 2015-03-24 ENCOUNTER — Encounter: Payer: Self-pay | Admitting: Family Medicine

## 2015-03-24 DIAGNOSIS — J069 Acute upper respiratory infection, unspecified: Secondary | ICD-10-CM | POA: Insufficient documentation

## 2015-03-24 DIAGNOSIS — Z0000 Encounter for general adult medical examination without abnormal findings: Secondary | ICD-10-CM | POA: Insufficient documentation

## 2015-03-24 NOTE — Assessment & Plan Note (Signed)
No recent flares. Encouraged adequate hydration

## 2015-03-24 NOTE — Assessment & Plan Note (Signed)
Follows with [facility], rate controlled today and tolerating Coumadin

## 2015-03-24 NOTE — Assessment & Plan Note (Signed)
Patient denies any difficulties at home. No trouble with ADLs, depression or falls. See EMR for functional status screen and depression screen. No recent changes to vision or hearing. Is UTD with immunizations. Is UTD with screening. Discussed Advanced Directives. Encouraged heart healthy diet, exercise as tolerated and adequate sleep. See patient's problem list for health risk factors to monitor. See AVS for preventative healthcare recommendation schedule.  Immunization Status: Flu vaccine-- 11/12/14 Tdap-- 12/11/14 PNA-- 12/11/14 Shingles-- 04/30/05; patient reported  A/P:  Changes to [place], PSH or Personal Hx: UTD CCS: patient reported that he does not recall an actual date, but it was completed with Dr. Clarene Essex; benign polyps found; no follow-up recommendation was provided. PSA: no record found; patient voiced that it was last done three or four years ago and the results were normal. Bone Density: has not been completed in the past.  Care Teams Updated:  Dr. [name] - Cardiology  Dr. Franchot Gallo - Urology  Dr. Burney Gauze - Oncology  ED/Hospital/Urgent Care Visits: Per the patient, no recent visits to the ED/Hospital or Urgent Care.

## 2015-03-24 NOTE — Progress Notes (Signed)
Patient ID: [name], male   DOB: 12-23-1927, 82 y.o.   MRN: [id number]   Subjective:    Patient ID: [name], male    DOB: [birth year], 82 y.o.   MRN: [id number]  Chief Complaint  Patient presents with  . Medicare Wellness    HPI Patient is in today for Wellness visit and follow-up. He is accompanied by his wife today. They note he has had some respiratory symptoms but they are improving. His cough is still affecting his sleep but it is not productive. No fevers or chills. No other new or acute complaints. Is doing well at home. They deny any difficulty with activities of daily living. Denies CP/palp/SOB/HA/congestion/fevers/GI or GU c/o. Taking meds as prescribed  Past Medical History  Diagnosis Date  . Bursitis     OF THE RIGHT SHOULDER  . Tinnitus   . Permanent atrial fibrillation (HCC)     s/p AV nodal ablation  . Depressed ejection fraction 10    EF previously 45-50%, s/p AV nodal ablation and biv pacemaker implant, previously enrolled in [facility] HF and was randomized to RV pacing only)  . Blood transfusion     "several; Dr. Deberah Pelton" (04/11/2014)  . GERD (gastroesophageal reflux disease)   . MDS (myelodysplastic syndrome), low grade ([place]) 02/10/2011  . Iron overload, transfusional 02/10/2011  . Complete heart block ([place]) 12/28/06    s/p AV nodal ablation and BIV pacemaker implant at [facility] by Dr Rosita Fire  . CHF (congestive heart failure) ([place])   . Chronic bronchitis ([place])     "I keep it"  . H/O hiatal hernia   . Gout   . Presence of permanent cardiac pacemaker   . Chronic anemia     RECIEVES ARANESP SHOTS IF HIS HEMOGLOBIN FALLS BELOW 11  . Bone marrow disorder     "I don't make enough RBCs"  . Arthritis     "hands, back" (04/11/2014)  . Pneumonia     "high school; [employer]; three times since, last time 10/2013" (04/11/2014)  . Gout 08/27/2014  . Preventative health care 09/09/2014    Dr Dorina Hoyer, urology, annually Dr Clover Mealy Dr Sharol Given Dr Ninfa Linden Dr  Gershon Crane, opthamology Dr Rayann Heman cardiology Dr Marin Olp   . Kidney stones 1985  . Chronic kidney disease (CKD)     "they work at ~ 30%" (04/11/2014). Dr. Marcelline Deist released pt from office visit last week.  . Medicare annual wellness visit, subsequent 03/24/2015    Past Surgical History  Procedure Laterality Date  . Total knee arthroplasty Bilateral     right Dec.2011,left June 2012  . Joint replacement    . Pilonidal cyst excision  1970's  . Kidney stone surgery  1985    "tried to go up my penis; had to cut my stomach and go in for lodged stone"  . Av node ablation  12/08    at [facility]  . Bunionectomy Bilateral 11/2012- 01/2013    "left-right; great toes; gout and bunion OR" (02/06/2013)  . Inguinal hernia repair Right   . Lithotripsy  1985  . Cataract extraction w/ intraocular lens  implant, bilateral Bilateral   . Permanent pacemaker generator change N/A 04/11/2013    Procedure: PERMANENT PACEMAKER GENERATOR CHANGE;  Surgeon: Evans Lance, MD;  Location: [facility] CATH LAB;  Service: Cardiovascular;  Laterality: N/A;  . Bi-ventricular pacemaker insertion (crt-p)  2008  . Lead revision  04/11/2014  . Kidney stone surgery  after 1985 kidney stone OR    "  put a tube in my back to collect stones"  . Lead revision N/A 04/11/2014    Procedure: LEAD REVISION;  Surgeon: [name], MD;  Location: [facility] CATH LAB;  Service: Cardiovascular;  Laterality: N/A;  . Insert / replace / remove pacemaker  12/28/2006;     MDT CRTP implanted 2008 by Dr Rosita Fire at [facility]; gen change 03/2013 by Dr Lovena Le  . Eus N/A 11/21/2014    Procedure: UPPER ENDOSCOPIC ULTRASOUND (EUS) RADIAL;  Surgeon: Arta Silence, MD;  Location: [facility] ENDOSCOPY;  Service: Endoscopy;  Laterality: N/A;  . Ercp N/A 11/27/2014    Procedure: ENDOSCOPIC RETROGRADE CHOLANGIOPANCREATOGRAPHY (ERCP);  Surgeon: Clarene Essex, MD;  Location: [facility] ENDOSCOPY;  Service: Endoscopy;  Laterality: N/A;    Family History  Problem Relation Age of Onset  .  Cancer Mother   . Melanoma Mother   . COPD Father   . Kidney disease Brother   . Heart disease Maternal Grandfather   . Heart attack Neg Hx   . Hypertension Neg Hx   . Stroke Neg Hx     Social History   Social History  . Marital Status: Married    Spouse Name: N/A  . Number of Children: N/A  . Years of Education: N/A   Occupational History  . Not on file.   Social History Main Topics  . Smoking status: Former Smoker -- 1.00 packs/day for 25 years    Types: Cigarettes    Start date: 02/23/1944    Quit date: 02/10/1968  . Smokeless tobacco: Never Used  . Alcohol Use: 0.0 oz/week    0 Standard drinks or equivalent per week     Comment: 04/11/2014 "drank a little bit from age [age]". Denies use now  . Drug Use: No  . Sexual Activity: No     Comment: lives with wife, retired as a Scientist, research (life sciences), no dietary restrictions   Other Topics Concern  . Not on file   Social History Narrative    Outpatient Prescriptions Prior to Visit  Medication Sig Dispense Refill  . doxazosin (CARDURA) 4 MG tablet Take 4 mg by mouth at bedtime.     . famotidine (PEPCID) 40 MG tablet Take 80 mg by mouth daily.     . finasteride (PROSCAR) 5 MG tablet Take 5 mg by mouth every evening.     . furosemide (LASIX) 40 MG tablet TAKE ONE TABLET BY MOUTH ONCE DAILY. 90 tablet 3  . Multiple Vitamin (MULTIVITAMIN WITH MINERALS) TABS tablet Take 1 tablet by mouth daily.    Marland Kitchen pyridoxine (B-6) 100 MG tablet Take 200 mg by mouth daily.     Marland Kitchen warfarin (COUMADIN) 5 MG tablet TAKE ONE TABLET BY MOUTH ONCE DAILY AT SUPPER EXCEPT ON SUNDAY 90 tablet 0  . allopurinol (ZYLOPRIM) 300 MG tablet Take 1 tablet (300 mg total) by mouth at bedtime. 30 tablet 1  . carvedilol (COREG) 6.25 MG tablet Take 1 tablet (6.25 mg total) by mouth 2 (two) times daily with a meal. 180 tablet 1   No facility-administered medications prior to visit.    Allergies  Allergen Reactions  . Methylprednisolone Other (See Comments)     Dizziness, passed out twice  . Moxifloxacin Hcl In Nacl Other (See Comments) and Itching    Pt c/o being "out of his head" & itching  . Ramipril Other (See Comments)    Renal function worsened on ACEi.    Review of Systems  Constitutional: Negative for fever, chills and malaise/fatigue.  HENT:  Positive for congestion. Negative for hearing loss.   Eyes: Negative for discharge.  Respiratory: Positive for cough. Negative for sputum production and shortness of breath.   Cardiovascular: Negative for chest pain, palpitations and leg swelling.  Gastrointestinal: Negative for heartburn, nausea, vomiting, abdominal pain, diarrhea, constipation and blood in stool.  Genitourinary: Negative for dysuria, urgency, frequency and hematuria.  Musculoskeletal: Negative for myalgias, back pain and falls.  Skin: Negative for rash.  Neurological: Negative for dizziness, sensory change, loss of consciousness, weakness and headaches.  Endo/Heme/Allergies: Negative for environmental allergies. Does not bruise/bleed easily.  Psychiatric/Behavioral: Negative for depression and suicidal ideas. The patient is not nervous/anxious and does not have insomnia.        Objective:    Physical Exam  Constitutional: He is oriented to person, place, and time. He appears well-developed and well-nourished. No distress.  HENT:  Head: Normocephalic and atraumatic.  Eyes: Conjunctivae are normal.  Neck: Neck supple. No thyromegaly present.  Cardiovascular: Normal rate and normal heart sounds.   Irregularly irregular  Pulmonary/Chest: Effort normal and breath sounds normal. No respiratory distress. He has no wheezes.  Abdominal: Soft. Bowel sounds are normal. He exhibits no mass. There is no tenderness.  Musculoskeletal: He exhibits no edema.  Lymphadenopathy:    He has no cervical adenopathy.  Neurological: He is alert and oriented to person, place, and time.  Skin: Skin is warm and dry.  Psychiatric: He has a normal  mood and affect. His behavior is normal.    BP 111/67 mmHg  Pulse 86  Temp(Src) 97.7 F (36.5 C) (Oral)  Ht 6\' 3"  (1.905 m)  Wt 209 lb (94.802 kg)  BMI 26.12 kg/m2  SpO2 99% Wt Readings from Last 3 Encounters:  03/12/15 209 lb (94.802 kg)  03/11/15 209 lb (94.802 kg)  01/08/15 207 lb (93.895 kg)     Lab Results  Component Value Date   WBC 4.7 01/01/2015   HGB 10.6* 01/01/2015   HCT 32.9* 01/01/2015   PLT 106* 01/01/2015   GLUCOSE 94 03/12/2015   CHOL 136 08/27/2014   TRIG 70.0 08/27/2014   HDL 49.70 08/27/2014   LDLCALC 72 08/27/2014   ALT 23 03/12/2015   AST 23 03/12/2015   NA 138 03/12/2015   K 4.1 03/12/2015   CL 103 03/12/2015   CREATININE 1.84* 03/12/2015   BUN 30* 03/12/2015   CO2 28 03/12/2015   TSH 2.51 08/27/2014   INR 2.4* 03/12/2015   HGBA1C 5.0 02/06/2013    Lab Results  Component Value Date   TSH 2.51 08/27/2014   Lab Results  Component Value Date   WBC 4.7 01/01/2015   HGB 10.6* 01/01/2015   HCT 32.9* 01/01/2015   MCV 95 01/01/2015   PLT 106* 01/01/2015   Lab Results  Component Value Date   NA 138 03/12/2015   K 4.1 03/12/2015   CO2 28 03/12/2015   GLUCOSE 94 03/12/2015   BUN 30* 03/12/2015   CREATININE 1.84* 03/12/2015   BILITOT 1.1 03/12/2015   ALKPHOS 80 03/12/2015   AST 23 03/12/2015   ALT 23 03/12/2015   PROT 7.6 03/12/2015   ALBUMIN 4.1 03/12/2015   CALCIUM 9.5 03/12/2015   ANIONGAP 3* 11/21/2014   GFR 37.28* 03/12/2015   Lab Results  Component Value Date   CHOL 136 08/27/2014   Lab Results  Component Value Date   HDL 49.70 08/27/2014   Lab Results  Component Value Date   LDLCALC 72 08/27/2014   Lab Results  Component  Value Date   TRIG 70.0 08/27/2014   Lab Results  Component Value Date   CHOLHDL 3 08/27/2014   Lab Results  Component Value Date   HGBA1C 5.0 02/06/2013       Assessment & Plan:   Problem List Items Addressed This Visit    Gout    No recent flares. Encouraged adequate hydration       Medicare annual wellness visit, subsequent    Patient denies any difficulties at home. No trouble with ADLs, depression or falls. See EMR for functional status screen and depression screen. No recent changes to vision or hearing. Is UTD with immunizations. Is UTD with screening. Discussed Advanced Directives. Encouraged heart healthy diet, exercise as tolerated and adequate sleep. See patient's problem list for health risk factors to monitor. See AVS for preventative healthcare recommendation schedule.  Immunization Status: Flu vaccine-- 11/12/14 Tdap-- 12/11/14 PNA-- 12/11/14 Shingles-- 04/30/05; patient reported  A/P:  Changes to [place], PSH or Personal Hx: UTD CCS: patient reported that he does not recall an actual date, but it was completed with Dr. Clarene Essex; benign polyps found; no follow-up recommendation was provided. PSA: no record found; patient voiced that it was last done three or four years ago and the results were normal. Bone Density: has not been completed in the past.  Care Teams Updated:  Dr. [name] - Cardiology  Dr. Franchot Gallo - Urology  Dr. Burney Gauze - Oncology  ED/Hospital/Urgent Care Visits: Per the patient, no recent visits to the ED/Hospital or Urgent Care.      Permanent atrial fibrillation (HCC)    Follows with [facility], rate controlled today and tolerating Coumadin      Stage III chronic kidney disease    Follows with [facility]       Other Visit Diagnoses    Monitoring for anticoagulant use    -  Primary    Relevant Orders    INR/PT (Completed)    Renal insufficiency        Relevant Orders    Comprehensive metabolic panel (Completed)       I am having Mr. Camisa start on HYDROcodone-homatropine. I am also having him maintain his pyridoxine, finasteride, multivitamin with minerals, furosemide, doxazosin, famotidine, and warfarin.  Meds ordered this encounter  Medications  . HYDROcodone-homatropine (HYCODAN) 5-1.5  MG/5ML syrup    Sig: Take 5 mLs by mouth every 8 (eight) hours as needed for cough.    Dispense:  120 mL    Refill:  0     Penni Homans, MD

## 2015-03-24 NOTE — Assessment & Plan Note (Signed)
Improving but does affect sleep, will allow refill on cough syrup, he will report if symptoms worsen. Encouraged increased rest and hydration, add probiotics, zinc such as Coldeze or Xicam. Treat fevers as needed

## 2015-03-24 NOTE — Assessment & Plan Note (Signed)
Follows with [facility] 

## 2015-04-05 ENCOUNTER — Other Ambulatory Visit: Payer: Self-pay | Admitting: Internal Medicine

## 2015-04-09 ENCOUNTER — Ambulatory Visit: Payer: Medicare Other

## 2015-04-09 ENCOUNTER — Other Ambulatory Visit (HOSPITAL_BASED_OUTPATIENT_CLINIC_OR_DEPARTMENT_OTHER): Payer: Medicare Other

## 2015-04-09 ENCOUNTER — Encounter: Payer: Self-pay | Admitting: Hematology & Oncology

## 2015-04-09 ENCOUNTER — Ambulatory Visit (HOSPITAL_BASED_OUTPATIENT_CLINIC_OR_DEPARTMENT_OTHER): Payer: Medicare Other | Admitting: Hematology & Oncology

## 2015-04-09 VITALS — BP 112/60 | HR 85 | Temp 97.5°F | Resp 16 | Ht 75.0 in | Wt 208.0 lb

## 2015-04-09 DIAGNOSIS — G452 Multiple and bilateral precerebral artery syndromes: Secondary | ICD-10-CM

## 2015-04-09 DIAGNOSIS — D4620 Refractory anemia with excess of blasts, unspecified: Secondary | ICD-10-CM | POA: Diagnosis not present

## 2015-04-09 DIAGNOSIS — N183 Chronic kidney disease, stage 3 unspecified: Secondary | ICD-10-CM

## 2015-04-09 LAB — CBC WITH DIFFERENTIAL (CANCER CENTER ONLY)
HEMATOCRIT: 33 % — AB (ref 38.7–49.9)
HGB: 10.9 g/dL — ABNORMAL LOW (ref 13.0–17.1)
MCH: 31.2 pg (ref 28.0–33.4)
MCHC: 33 g/dL (ref 32.0–35.9)
MCV: 95 fL (ref 82–98)
Platelets: 91 10*3/uL — ABNORMAL LOW (ref 145–400)
RBC: 3.49 10*6/uL — ABNORMAL LOW (ref 4.20–5.70)
RDW: 14.9 % (ref 11.1–15.7)
WBC: 5.6 10*3/uL (ref 4.0–10.0)

## 2015-04-09 LAB — MANUAL DIFFERENTIAL (CHCC SATELLITE)
ALC: 2.2 10*3/uL (ref 0.9–3.3)
ANC (CHCC MAN DIFF): 2.3 10*3/uL (ref 1.5–6.5)
LYMPH: 40 % (ref 14–48)
MONO: 19 % — ABNORMAL HIGH (ref 0–13)
PLT EST ~~LOC~~: DECREASED
Platelet Morphology: NORMAL
SEG: 41 % (ref 40–75)

## 2015-04-09 LAB — IRON AND TIBC
%SAT: 56 % — ABNORMAL HIGH (ref 20–55)
Iron: 102 ug/dL (ref 42–163)
TIBC: 183 ug/dL — AB (ref 202–409)
UIBC: 81 ug/dL — AB (ref 117–376)

## 2015-04-09 LAB — FERRITIN: Ferritin: 1463 ng/ml — ABNORMAL HIGH (ref 22–316)

## 2015-04-09 LAB — CHCC SATELLITE - SMEAR

## 2015-04-09 NOTE — Progress Notes (Signed)
He is  Hematology and Oncology Follow Up Visit  PROCOPIO ROVNER [id number] Mar 02, 1927 82 y.o. 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  Mcgavock is back for followup.  He is feeling quite good. Unfortunately, his wife is not doing too well. She is having a lot of back problems. She has osteoporosis so operated on her would be very difficult problem.  He has had no issues. He is on Coumadin. I think his family doctor and images this.  We saw him back in December, his ferritin was [id number]. His iron saturation was 51%. Total iron was 95.  His appetite has been good. He's had no nausea or vomiting. He's had no change in bowel or bladder habits.  He's noted any leg swelling. He has no weakness. There's been no joint swelling.  He's had no ecchymoses or petechia.    Overall, his performance status is ECOG 1.  Medications:  Current outpatient prescriptions:  .  allopurinol (ZYLOPRIM) 300 MG tablet, Take 1 tablet (300 mg total) by mouth at bedtime., Disp: 90 tablet, Rfl: 1 .  carvedilol (COREG) 6.25 MG tablet, TAKE ONE TABLET BY MOUTH TWICE DAILY WITH MEALS, Disp: 180 tablet, Rfl: 0 .  doxazosin (CARDURA) 4 MG tablet, Take 4 mg by mouth at bedtime. , Disp: , Rfl:  .  famotidine (PEPCID) 40 MG tablet, Take 80 mg by mouth daily. , Disp: , Rfl:  .  finasteride (PROSCAR) 5 MG tablet, Take 5 mg by mouth every evening. , Disp: , Rfl:  .  furosemide (LASIX) 40 MG tablet, TAKE ONE TABLET BY MOUTH ONCE DAILY, Disp: 90 tablet, Rfl: 0 .  Multiple Vitamin (MULTIVITAMIN WITH MINERALS) TABS tablet, Take 1 tablet by mouth daily., Disp: , Rfl:  .  pyridoxine (B-6) 100 MG tablet, Take 200 mg by mouth daily. , Disp: , Rfl:  .  warfarin (COUMADIN) 5 MG tablet,  TAKE ONE TABLET BY MOUTH ONCE DAILY AT SUPPER EXCEPT ON SUNDAY, Disp: 90 tablet, Rfl: 0  Allergies:  Allergies  Allergen Reactions  . Methylprednisolone Other (See Comments)    Dizziness, passed out twice  . Moxifloxacin Hcl In Nacl Other (See Comments) and Itching    Pt c/o being "out of his head" & itching  . Ramipril Other (See Comments)    Renal function worsened on ACEi.    Past Medical History, Surgical history, Social history, and Family History were reviewed and updated.  Review of Systems: As above  Physical Exam:  height is 6\' 3"  (1.905 m) and weight is 208 lb (94.348 kg). His oral temperature is 97.5 F (36.4 C). His blood pressure is 112/60 and his pulse is 85. His respiration is 16.   Well-developed and well-nourished white woman. Head and neck exam shows no ocular or oral lesion.  He has no palpable cervical or supraclavicular lymph nodes. Lungs are clear. He does arouse, wheezes or rhonchi. Cardiac exam is regular rate and rhythm. He has an occasional extra beat. His he has a 1/6 systolic murmur. Abdomen is soft. He has good bowel sounds. There is no fluid wave. There is no palpable liver or spleen tip. Extremities shows no clubbing, cyanosis or edema. His compression stockings on his legs. Skin exam shows no rashes, ecchymosis  or petechia. Neurological exam shows no focal neurological deficits.  Lab Results  Component Value Date   WBC 5.6 04/09/2015   HGB 10.9* 04/09/2015   HCT 33.0* 04/09/2015   MCV 95 04/09/2015   PLT 91* 04/09/2015     Chemistry      Component Value Date/Time   NA 138 03/12/2015 1045   NA 146* 05/07/2014 1020   K 4.1 03/12/2015 1045   K 4.0 05/07/2014 1020   CL 103 03/12/2015 1045   CL 106 05/07/2014 1020   CO2 28 03/12/2015 1045   CO2 29 05/07/2014 1020   BUN 30* 03/12/2015 1045   BUN 21 05/07/2014 1020   CREATININE 1.84* 03/12/2015 1045   CREATININE 1.7* 05/07/2014 1020      Component Value Date/Time   CALCIUM 9.5 03/12/2015 1045     CALCIUM 9.4 05/07/2014 1020   ALKPHOS 80 03/12/2015 1045   ALKPHOS 76 05/07/2014 1020   AST 23 03/12/2015 1045   AST 27 05/07/2014 1020   ALT 23 03/12/2015 1045   ALT 19 05/07/2014 1020   BILITOT 1.1 03/12/2015 1045   BILITOT 1.40 05/07/2014 1020         Impression and Plan: Mr. Quattrochi is a 82 year old gentleman. He has refractory anemia. We do not have to give him any Aranesp. He's done incredibly well with this.  On his blood smear, I do not see any evidence of transition over to leukemia.   We will have another appointment with him in 3 months.   We are watching his iron studies.I do not see that we have had to put him on any iron daily therapy right now. His iron saturation is not that bad.   [name], MD 3/21/201710:02 AM

## 2015-04-10 ENCOUNTER — Ambulatory Visit (INDEPENDENT_AMBULATORY_CARE_PROVIDER_SITE_OTHER): Payer: Medicare Other

## 2015-04-10 ENCOUNTER — Telehealth: Payer: Self-pay | Admitting: Medical

## 2015-04-10 DIAGNOSIS — I4821 Permanent atrial fibrillation: Secondary | ICD-10-CM

## 2015-04-10 DIAGNOSIS — I482 Chronic atrial fibrillation: Secondary | ICD-10-CM

## 2015-04-10 LAB — RETICULOCYTES: Reticulocyte Count: 0.6 % (ref 0.6–2.6)

## 2015-04-10 LAB — POCT INR
INR: 2.7
INR: 2.7

## 2015-04-10 NOTE — Telephone Encounter (Signed)
Reviewed the sheets you copied for me. Have him stay on same dose but follow up in  about 2 weeks with Dr. Charlett Blake for INR. Have schedule for a day when she is in to review results.

## 2015-04-10 NOTE — Telephone Encounter (Signed)
Did you get my message on when to repeat?

## 2015-04-10 NOTE — Telephone Encounter (Signed)
Yes appointment scheduled

## 2015-04-10 NOTE — Progress Notes (Addendum)
Pre visit review using our clinic review tool, if applicable. No additional management support is needed unless otherwise documented below in the visit note.  Patient in for INR check per Dr. Frederik Pear. INR = 2.7 which is within goal of 2.0-3.0  Per E.Saguier covering for Dr. Charlett Blake. Patient to continue same dosage and return to office for repeat INR in 2 weeks.  I did review this and confirm this was my plan I advised.

## 2015-04-10 NOTE — Patient Instructions (Signed)
Per Mackie Pai PA-C,covering for Dr. Charlett Blake: Take 2.5 mg (0.5 tab) on Monday, Wednesday, and Friday. Take 5 mg (1 tab) all other days. Recheck INR in 2 weeks.Marland Kitchen Appointment scheduled for  April 23, 2015 at 2:45 pm

## 2015-04-12 ENCOUNTER — Telehealth: Payer: Self-pay | Admitting: Medical

## 2015-04-12 NOTE — Telephone Encounter (Signed)
[name] regarding his INR. Call pt and make sure he comes back in on day when Dr. Charlett Blake in.

## 2015-04-21 ENCOUNTER — Other Ambulatory Visit: Payer: Self-pay | Admitting: Family Medicine

## 2015-04-23 ENCOUNTER — Ambulatory Visit (INDEPENDENT_AMBULATORY_CARE_PROVIDER_SITE_OTHER): Payer: Medicare Other | Admitting: Behavioral Health

## 2015-04-23 DIAGNOSIS — I482 Chronic atrial fibrillation: Secondary | ICD-10-CM

## 2015-04-23 DIAGNOSIS — I4821 Permanent atrial fibrillation: Secondary | ICD-10-CM

## 2015-04-23 LAB — POCT INR: INR: 2.6

## 2015-04-23 NOTE — Progress Notes (Signed)
Pre visit review using our clinic review tool, if applicable. No additional management support is needed unless otherwise documented below in the visit note.  Patient in office today for INR check. Today's reading was 2.6. Patient did not report any changes. Per Dr. Charlett Blake: Continue taking 2.5 mg on Monday, Wednesday, and Friday. Take 5 mg all other days. Recheck INR in 4 weeks.  Informed patient of the provider's instructions. He understood and did not have any concerns prior to leaving visit. Next appointment scheduled for 05/21/15 at 10:45 AM.

## 2015-04-23 NOTE — Patient Instructions (Signed)
Per Dr. Charlett Blake: Continue taking 2.5 mg (0.5 tab) on Monday, Wednesday, and Friday. Take 5 mg (1 tab) all other days. Recheck INR in 4 weeks.

## 2015-05-15 ENCOUNTER — Ambulatory Visit (INDEPENDENT_AMBULATORY_CARE_PROVIDER_SITE_OTHER): Payer: Medicare Other | Admitting: *Deleted

## 2015-05-15 DIAGNOSIS — I442 Atrioventricular block, complete: Secondary | ICD-10-CM

## 2015-05-15 DIAGNOSIS — Z95810 Presence of automatic (implantable) cardiac defibrillator: Secondary | ICD-10-CM

## 2015-05-15 DIAGNOSIS — I5022 Chronic systolic (congestive) heart failure: Secondary | ICD-10-CM

## 2015-05-15 DIAGNOSIS — Z950 Presence of cardiac pacemaker: Secondary | ICD-10-CM | POA: Diagnosis not present

## 2015-05-15 NOTE — Progress Notes (Signed)
Remote ICD transmission.   

## 2015-05-16 NOTE — Progress Notes (Signed)
Received call back from patient.  He stated he is doing well.  Denied fluid symptoms.  No med changes and compliant taking meds as prescribed.  Next transmission 06/19/2015.  Encouraged to call for any fluid symptoms.  Weight: 203

## 2015-05-16 NOTE — Progress Notes (Addendum)
EPIC Encounter for ICM Monitoring  Patient Name: [name] is a 82 y.o. male Date: 05/16/2015 Primary Care Physican: Penni Homans, MD Primary Cardiologist: Mare Ferrari Electrophysiologist: Allred Dry Weight: unknown   Bi-V Pacing 98.3%      In the past month, have you:  1. Gained more than 2 pounds in a day or more than 5 pounds in a week? N/A  2. Had changes in your medications (with verification of current medications)? N/A  3. Had more shortness of breath than is usual for you? N/A  4. Limited your activity because of shortness of breath? N/A  5. Not been able to sleep because of shortness of breath? N/A  6. Had increased swelling in your feet or ankles? N/A  7. Had symptoms of dehydration (dizziness, dry mouth, increased thirst, decreased urine output) N/A  8. Had changes in sodium restriction? N/A  9. Been compliant with medication? N/A   ICM trend: 3 month view for 05/15/2015   ICM trend: 1 year view for 05/15/2015   Follow-up plan: ICM clinic phone appointment on 06/19/2015.  Attempted call to patient and unable to reach.  Transmission reviewed.  Thoracic impedance decreased from 05/08/2015 to 05/11/2015 suggesting fluid accumulation and returned to baseline 05/12/2015 suggesting fluid levels are stable.       Rosalene Billings, RN, CCM 05/16/2015 10:07 AM

## 2015-05-21 ENCOUNTER — Ambulatory Visit (INDEPENDENT_AMBULATORY_CARE_PROVIDER_SITE_OTHER): Payer: Medicare Other | Admitting: Behavioral Health

## 2015-05-21 DIAGNOSIS — I4891 Unspecified atrial fibrillation: Secondary | ICD-10-CM

## 2015-05-21 DIAGNOSIS — I482 Chronic atrial fibrillation: Secondary | ICD-10-CM | POA: Diagnosis not present

## 2015-05-21 DIAGNOSIS — I4821 Permanent atrial fibrillation: Secondary | ICD-10-CM

## 2015-05-21 LAB — POCT INR: INR: 2

## 2015-05-21 NOTE — Progress Notes (Signed)
Patient in office for INR check. Today's reading was 2.0. He reported one new medication, Gabapentin 300 mg daily at bedtime. No other positive patient findings provided during the visit.  Informed patient to continue taking 2.5 mg on Monday, Wednesday, and Friday. Take 5 mg all other days. Recheck INR in 4 weeks. He voiced understanding and did not have any concerns. Next appointment scheduled for 06/18/15 at 9:00 AM.

## 2015-05-21 NOTE — Patient Instructions (Signed)
Continue taking 2.5 mg on Monday, Wednesday, and Friday. Take 5 mg all other days. Recheck INR in 4 weeks.

## 2015-05-21 NOTE — Progress Notes (Addendum)
Pre visit review using our clinic review tool, if applicable. No additional management support is needed unless otherwise documented below in the visit note. 

## 2015-06-16 ENCOUNTER — Ambulatory Visit: Payer: Self-pay | Admitting: General Surgery

## 2015-06-16 NOTE — H&P (Signed)
[name] 06/14/2015 4:30 PM Location: [facility] Patient #: [id number] DOB: 1927-02-11 Married / Language: [name] / Race: White Male  History of Present Illness Henry Hiss M. Yameli Delamater MD; 06/16/2015 5:08 PM) The patient is a 82 year old male who presents for evaluation of gall stones. He is referred by [name] for evaluation for cholecystectomy. He presented to the hospital back in early November 2016 with upper abdominal pain, right-sided abdominal pain and was found to have elevated liver function tests. He was found to have a common bile duct stone. He underwent ERCP with sphincterotomy on November 8 by [name]. The few common bile duct stones were removed. He states that he did well for a while. However he relates at least 2 distinctive additional episodes one in April and one in May that he describes as bad attacks. They are in the upper abdomen day or so she was nausea and and sometimes emesis. He denies any fevers or chills. He denies any unplanned weight loss. He denies any jaundice. He has a pacemaker. He also is on Coumadin. He denies any chest pain, chest pressure, shortness of breath, orthopnea, paroxysmal nocturnal dyspnea, TIAs or amaurosis fugax. He denies any significant peripheral edema. He had a lower midline incision in 1985 for kidney stones   Problem List/Past Medical Henry Curry, MD; 06/16/2015 5:11 PM) ANTICOAGULATED ON COUMADIN (Z51.81) SYMPTOMATIC CHOLELITHIASIS (K80.20) PACEMAKER (Z95.0) ATRIAL FIBRILLATION, CHRONIC (I48.2)  Other Problems Henry Curry, MD; 06/16/2015 5:11 PM) Arthritis Congestive Heart Failure Enlarged Prostate Gastroesophageal Reflux Disease Transfusion history  Past Surgical History [name], [name]; 06/14/2015 4:32 PM) Knee Surgery Left. Oral Surgery  Diagnostic Studies History [name], [name]; 06/14/2015 4:32 PM) Colonoscopy 5-10 years ago  Allergies [name], [name]; 06/14/2015 4:33  PM) MethylPREDNISolone *CORTICOSTEROIDS* Dizziness. Moxifloxacin HCl *CHEMICALS* Ramipril *ANTIHYPERTENSIVES*  Medication History [name], [name]; 06/14/2015 4:36 PM) Allopurinol (300MG  Tablet, Oral) Active. Carvedilol (6.25MG  Tablet, Oral) Active. Doxazosin Mesylate (4MG  Tablet, Oral) Active. Famotidine (40MG  Tablet, Oral) Active. Finasteride (5MG  Tablet, Oral) Active. Furosemide (40MG  Tablet, Oral) Active. Multiple Vitamins/Minerals (Oral) Active. Pyridoxine HCl (100MG  Tablet ER, Oral) Active. Warfarin Sodium (5MG  Tablet, Oral) Active. Medications Reconciled  Social History [name], [name]; 06/14/2015 4:32 PM) Caffeine use Carbonated beverages. No drug use Tobacco use Former smoker.  Family History [name], [name]; 06/14/2015 4:32 PM) Colon Cancer Mother. Kidney Disease Mother.     Review of Systems [name] [name]; 06/14/2015 4:32 PM) General Not Present- Appetite Loss, Chills, Fatigue, Fever, Night Sweats, Weight Gain and Weight Loss. Skin Not Present- Change in Wart/Mole, Dryness, Hives, Jaundice, New Lesions, Non-Healing Wounds, Rash and Ulcer. HEENT Present- Hearing Loss, Ringing in the Ears, Wears glasses/contact lenses and Yellow Eyes. Not Present- Earache, Hoarseness, Nose Bleed, Oral Ulcers, Seasonal Allergies, Sinus Pain, Sore Throat and Visual Disturbances. Respiratory Not Present- Bloody sputum, Chronic Cough, Difficulty Breathing, Snoring and Wheezing. Breast Not Present- Breast Mass, Breast Pain, Nipple Discharge and Skin Changes. Cardiovascular Not Present- Chest Pain, Difficulty Breathing Lying Down, Leg Cramps, Palpitations, Rapid Heart Rate, Shortness of Breath and Swelling of Extremities. Gastrointestinal Not Present- Abdominal Pain, Bloating, Bloody Stool, Change in Bowel Habits, Chronic diarrhea, Constipation, Difficulty Swallowing, Excessive gas, Gets full quickly at meals, Hemorrhoids, Indigestion, Nausea, Rectal Pain and  Vomiting. Male Genitourinary Present- Impotence. Not Present- Blood in Urine, Change in Urinary Stream, Frequency, Nocturia, Painful Urination, Urgency and Urine Leakage. Musculoskeletal Present- Joint Pain. Not Present- Back Pain, Joint Stiffness, Muscle Pain, Muscle Weakness and Swelling of  Extremities. Neurological Not Present- Decreased Memory, Fainting, Headaches, Numbness, Seizures, Tingling, Tremor, Trouble walking and Weakness. Psychiatric Not Present- Anxiety, Bipolar, Change in Sleep Pattern, Depression, Fearful and Frequent crying. Endocrine Not Present- Cold Intolerance, Excessive Hunger, Hair Changes, Heat Intolerance, Hot flashes and New Diabetes. Hematology Present- Easy Bruising. Not Present- Excessive bleeding, Gland problems, HIV and Persistent Infections.  Vitals [name] [name]; 06/14/2015 4:36 PM) 06/14/2015 4:36 PM Weight: 207 lb Height: 75in Body Surface Area: 2.23 m Body Mass Index: 25.87 kg/m  Temp.: 97.29F  Pulse: 85 (Regular)  BP: 126/72 (Sitting, Left Arm, Standard)      Physical Exam Henry Hiss M. Riot Waterworth MD; 06/16/2015 5:09 PM)  General Mental Status-Alert. General Appearance-Consistent with stated age. Hydration-Well hydrated. Voice-Normal.  Head and Neck Head-normocephalic, atraumatic with no lesions or palpable masses. Trachea-midline. Thyroid Gland Characteristics - normal size and consistency.  Eye Eyeball - Bilateral-Extraocular movements intact. Sclera/Conjunctiva - Bilateral-No scleral icterus.  Chest and Lung Exam Chest and lung exam reveals -quiet, even and easy respiratory effort with no use of accessory muscles and on auscultation, normal breath sounds, no adventitious sounds and normal vocal resonance. Inspection Chest Wall - Normal. Back - normal.  Breast - Did not examine.  Cardiovascular Cardiovascular examination reveals -normal heart sounds, regular rate and rhythm with no murmurs and normal pedal  pulses bilaterally. Note: pacemaker  Abdomen Inspection Inspection of the abdomen reveals - No Hernias. Skin - Scar - Note: lower midline. Palpation/Percussion Palpation and Percussion of the abdomen reveal - Soft, Non Tender, No Rebound tenderness, No Rigidity (guarding) and No hepatosplenomegaly. Auscultation Auscultation of the abdomen reveals - Bowel sounds normal.  Peripheral Vascular Upper Extremity Palpation - Pulses bilaterally normal.  Neurologic Neurologic evaluation reveals -alert and oriented x 3 with no impairment of recent or remote memory. Mental Status-Normal.  Neuropsychiatric The patient's mood and affect are described as -normal. Judgment and Insight-insight is appropriate concerning matters relevant to self.  Musculoskeletal Normal Exam - Left-Upper Extremity Strength Normal and Lower Extremity Strength Normal. Normal Exam - Right-Upper Extremity Strength Normal and Lower Extremity Strength Normal.  Lymphatic Head & Neck  General Head & Neck Lymphatics: Bilateral - Description - Normal. Axillary - Did not examine. Femoral & Inguinal - Did not examine.    Assessment & Plan Henry Hiss M. Temitayo Covalt MD; 06/16/2015 5:11 PM)  SYMPTOMATIC CHOLELITHIASIS (K80.20) Impression: I believe the patient's symptoms are consistent with gallbladder disease.  We discussed gallbladder disease. The patient was given Neurosurgeon. We discussed non-operative and operative management. We discussed the signs & symptoms of acute cholecystitis  Since he has had what sounds like ongoing episodes of biliary colic, I have recommended a cholecystectomy  I discussed laparoscopic cholecystectomy with IOC in detail. The patient was given educational material as well as diagrams detailing the procedure. We discussed the risks and benefits of a laparoscopic cholecystectomy including, but not limited to bleeding, infection, injury to surrounding structures such as the  intestine or liver, bile leak, retained gallstones, need to convert to an open procedure, prolonged diarrhea, blood clots such as DVT, common bile duct injury, anesthesia risks, and possible need for additional procedures. We discussed the typical post-operative recovery course. I explained that the likelihood of improvement of their symptoms is good.  We did discuss that he was at slightly higher risk for bleeding and hematoma and blood clot given his chronic medical conditions  The patient has elected to proceed with surgery. We will get cardiac clearance from his cardiologist as well as permission  from his PCP to hold his Coumadin  Current Plans Pt Education - Pamphlet Given - Laparoscopic Gallbladder Surgery: discussed with patient and provided information. I recommended obtaining preoperative cardiac clearance. I am concerned about the health of the patient and the ability to tolerate the operation. Therefore, we will request clearance by cardiology to better assess operative risk & see if a reevaluation, further workup, etc is needed. Also recommendations on how medications such as for anticoagulation and blood pressure should be managed/held/restarted after surgery. ANTICOAGULATED ON COUMADIN (Z51.81)  PACEMAKER (Z95.0)  ATRIAL FIBRILLATION, CHRONIC (I48.2)  [name]. [name], MD, FACS General, Bariatric, & Minimally Invasive Surgery [facility], [place]

## 2015-06-18 ENCOUNTER — Ambulatory Visit (INDEPENDENT_AMBULATORY_CARE_PROVIDER_SITE_OTHER): Payer: Medicare Other | Admitting: *Deleted

## 2015-06-18 DIAGNOSIS — I4821 Permanent atrial fibrillation: Secondary | ICD-10-CM

## 2015-06-18 DIAGNOSIS — I482 Chronic atrial fibrillation: Secondary | ICD-10-CM | POA: Diagnosis not present

## 2015-06-18 LAB — POCT INR: INR: 3.3

## 2015-06-18 NOTE — Progress Notes (Signed)
Pre visit review using our clinic review tool, if applicable. No additional management support is needed unless otherwise documented below in the visit note.  INR today 3.3. Pt has been taking Mucinex for about 1 week for bronchitis. Pt findings otherwise negative.   Pt prefers to be called w/ medication changes so he does not have to wait in office.   Per Dr. Charlett Blake: Take coumadin 2.5 mg Monday, Wednesday, Friday, and Saturday. Take coumadin 5 mg every other day. Recheck INR in 7-10 days.   AVS mailed to pt. Called pt and notified him of instructions.   Next appt: 06/27/15  Dorrene German, RN

## 2015-06-18 NOTE — Patient Instructions (Signed)
Per Dr. Charlett Blake: Take coumadin 2.5 mg Monday, Wednesday, Friday, and Saturday. Take coumadin 5 mg every other day. Recheck INR in 7-10 days.

## 2015-06-19 ENCOUNTER — Ambulatory Visit (INDEPENDENT_AMBULATORY_CARE_PROVIDER_SITE_OTHER): Payer: Medicare Other

## 2015-06-19 DIAGNOSIS — I5022 Chronic systolic (congestive) heart failure: Secondary | ICD-10-CM

## 2015-06-19 DIAGNOSIS — Z950 Presence of cardiac pacemaker: Secondary | ICD-10-CM

## 2015-06-20 ENCOUNTER — Telehealth: Payer: Self-pay

## 2015-06-20 NOTE — Progress Notes (Deleted)
EPIC Encounter for ICM Monitoring  Patient Name: [name] is a 82 y.o. male Date: 06/20/2015 Primary Care Physican: Penni Homans, MD Primary Cardiologist: Mare Ferrari Electrophysiologist: Allred Dry Weight: 202 lbs   Bi-V Pacing 97.1%      In the past month, have you:  1. Gained more than 2 pounds in a day or more than 5 pounds in a week? no  2. Had changes in your medications (with verification of current medications)? no  3. Had more shortness of breath than is usual for you? no  4. Limited your activity because of shortness of breath? no  5. Not been able to sleep because of shortness of breath? no  6. Had increased swelling in your feet, ankles, legs or stomach area? no  7. Had symptoms of dehydration (dizziness, dry mouth, increased thirst, decreased urine output) no  8. Had changes in sodium restriction? no  9. Been compliant with medication? Yes  ICM trend: 3 month view for 06/19/2015   ICM trend: 1 year view for 06/19/2015   Follow-up plan: ICM clinic phone appointment 08/08/2015.  Office appointment with Tommye Standard, NP 07/08/2015.    FLUID LEVELS:  Optivol thoracic impedance decreased 06/09/2015 to 06/14/2015 suggesting fluid accumulation and returned to baseline 06/14/2015.      SYMPTOMS:  None.  Denied any symptoms such as weight gain of 3 pounds overnight or 5 pounds within a week, SOB and/or lower extremity swelling.   He stated he is feeling fine today.  Encouraged to call for any fluid symptoms.   RECOMMENDATIONS: No changes today.     Rosalene Billings, RN, CCM 06/20/2015 9:29 AM

## 2015-06-20 NOTE — Telephone Encounter (Signed)
Remote ICM transmission received.  Attempted patient call and left message for return call.   

## 2015-06-20 NOTE — Progress Notes (Deleted)
EPIC Encounter for ICM Monitoring  Patient Name: [name] is a 82 y.o. male Date: 06/20/2015 Primary Care Physican: Penni Homans, MD Primary Cardiologist: Mare Ferrari Electrophysiologist: Allred Dry Weight: 202 lbs   Bi-V Pacing 97.1%      In the past month, have you:  1. Gained more than 2 pounds in a day or more than 5 pounds in a week? no  2. Had changes in your medications (with verification of current medications)? no  3. Had more shortness of breath than is usual for you? no  4. Limited your activity because of shortness of breath? no  5. Not been able to sleep because of shortness of breath? no  6. Had increased swelling in your feet, ankles, legs or stomach area? no  7. Had symptoms of dehydration (dizziness, dry mouth, increased thirst, decreased urine output) no  8. Had changes in sodium restriction? no  9. Been compliant with medication? Yes  ICM trend: 3 month view for 06/19/2015  ICM trend: 1 year view for 06/19/2015  Follow-up plan: ICM clinic phone appointment ***.   FLUID LEVELS: ***[place] ***Optivol thoracic impedance decreased *** to *** suggesting ***fluid accumulation ***dryness.   SYMPTOMS: ***None.  Denied any symptoms such as ***weight gain of 3 pounds overnight or 5 pounds within a week, SOB and/or lower extremity swelling. Encouraged to call for any fluid symptoms.  ***EDUCATION: Limit sodium intake to < 2000 mg and fluid intake to 64 oz daily.    ***RECOMMENDATIONS: No changes today.    ***Advised will send to PCP, Dr. Marland Kitchen and Dr. Marland Kitchen for review ***regarding symptoms and decreased thoracic impedance.  If any recommendations, will call back.    Rosalene Billings, RN, CCM 06/20/2015 10:15 AM

## 2015-06-20 NOTE — Progress Notes (Addendum)
EPIC Encounter for ICM Monitoring  Patient Name: [name] is a 82 y.o. male Date: 06/20/2015 Primary Care Physican: Penni Homans, MD Primary Cardiologist: Bobby Rumpf Electrophysiologist: Allred Dry Weight: 202 lbs   Bi-V Pacing 97.1%      In the past month, have you:  1. Gained more than 2 pounds in a day or more than 5 pounds in a week? no  2. Had changes in your medications (with verification of current medications)? no  3. Had more shortness of breath than is usual for you? no  4. Limited your activity because of shortness of breath? no  5. Not been able to sleep because of shortness of breath? no  6. Had increased swelling in your feet, ankles, legs or stomach area? no  7. Had symptoms of dehydration (dizziness, dry mouth, increased thirst, decreased urine output) no  8. Had changes in sodium restriction? no  9. Been compliant with medication? Yes  ICM trend: 3 month view for 06/19/2015   ICM trend: 1 year view for 06/19/2015   Follow-up plan: ICM clinic phone appointment 08/08/2015.    FLUID LEVELS:  Optivol thoracic impedance decreased 06/09/2015 to 06/14/2015 suggesting fluid accumulation and returned to baseline 06/14/2015.    SYMPTOMS:  None.  Denied any symptoms such as weight gain of 3 pounds overnight or 5 pounds within a week, SOB and/or lower extremity swelling.   He stated he is feeling well at this time.  Encouraged to call for any fluid symptoms.   RECOMMENDATIONS: No changes today.     Rosalene Billings, RN, CCM 06/20/2015 10:32 AM

## 2015-06-25 LAB — CUP PACEART REMOTE DEVICE CHECK
Battery Remaining Longevity: 53 mo
Brady Statistic AP VP Percent: 0 %
Brady Statistic RA Percent Paced: 0 %
Brady Statistic RV Percent Paced: 99.12 %
Date Time Interrogation Session: 20170426182312
Implantable Lead Implant Date: 20160323
Implantable Lead Location: 753860
Lead Channel Impedance Value: 4047 Ohm
Lead Channel Impedance Value: 418 Ohm
Lead Channel Impedance Value: 494 Ohm
Lead Channel Impedance Value: 722 Ohm
Lead Channel Pacing Threshold Amplitude: 0.75 V
Lead Channel Pacing Threshold Pulse Width: 0.4 ms
Lead Channel Sensing Intrinsic Amplitude: 15.625 mV
Lead Channel Setting Pacing Amplitude: 2.5 V
Lead Channel Setting Pacing Amplitude: 2.5 V
Lead Channel Setting Pacing Pulse Width: 0.4 ms
Lead Channel Setting Pacing Pulse Width: 0.8 ms
MDC IDC LEAD IMPLANT DT: 20081209
MDC IDC LEAD LOCATION: 753858
MDC IDC MSMT BATTERY VOLTAGE: 3 V
MDC IDC MSMT LEADCHNL LV IMPEDANCE VALUE: 4047 Ohm
MDC IDC MSMT LEADCHNL LV IMPEDANCE VALUE: 4047 Ohm
MDC IDC MSMT LEADCHNL LV IMPEDANCE VALUE: 589 Ohm
MDC IDC MSMT LEADCHNL RA IMPEDANCE VALUE: 4047 Ohm
MDC IDC MSMT LEADCHNL RA IMPEDANCE VALUE: 4047 Ohm
MDC IDC MSMT LEADCHNL RA SENSING INTR AMPL: 31.625 mV
MDC IDC MSMT LEADCHNL RA SENSING INTR AMPL: 31.625 mV
MDC IDC MSMT LEADCHNL RV SENSING INTR AMPL: 15.625 mV
MDC IDC SET LEADCHNL RV SENSING SENSITIVITY: 5.6 mV
MDC IDC STAT BRADY AP VS PERCENT: 0 %
MDC IDC STAT BRADY AS VP PERCENT: 99.12 %
MDC IDC STAT BRADY AS VS PERCENT: 0.88 %

## 2015-06-26 ENCOUNTER — Telehealth: Payer: Self-pay

## 2015-06-26 NOTE — Telephone Encounter (Signed)
Returned patient's call.  He reported he needs Carvedilol refill and has about 2-3 days left of medication.  Advised since he will be in the office tomorrow seeing Chanetta Marshall, NP then can order refill at that time.  Explained would be better to wait until appointment in case there are medication changes.

## 2015-06-27 ENCOUNTER — Encounter: Payer: Self-pay | Admitting: Internal Medicine

## 2015-06-27 ENCOUNTER — Encounter: Payer: Self-pay | Admitting: Nurse Practitioner

## 2015-06-27 ENCOUNTER — Telehealth: Payer: Self-pay | Admitting: Family Medicine

## 2015-06-27 ENCOUNTER — Ambulatory Visit (INDEPENDENT_AMBULATORY_CARE_PROVIDER_SITE_OTHER): Payer: Medicare Other | Admitting: Nurse Practitioner

## 2015-06-27 ENCOUNTER — Ambulatory Visit (INDEPENDENT_AMBULATORY_CARE_PROVIDER_SITE_OTHER): Payer: Medicare Other | Admitting: *Deleted

## 2015-06-27 VITALS — BP 100/62 | HR 70 | Ht 75.0 in | Wt 206.0 lb

## 2015-06-27 DIAGNOSIS — I482 Chronic atrial fibrillation: Secondary | ICD-10-CM

## 2015-06-27 DIAGNOSIS — Z01818 Encounter for other preprocedural examination: Secondary | ICD-10-CM | POA: Diagnosis not present

## 2015-06-27 DIAGNOSIS — I5022 Chronic systolic (congestive) heart failure: Secondary | ICD-10-CM | POA: Diagnosis not present

## 2015-06-27 DIAGNOSIS — I442 Atrioventricular block, complete: Secondary | ICD-10-CM | POA: Diagnosis not present

## 2015-06-27 DIAGNOSIS — I4821 Permanent atrial fibrillation: Secondary | ICD-10-CM

## 2015-06-27 LAB — CUP PACEART INCLINIC DEVICE CHECK
Brady Statistic AP VS Percent: 0 %
Brady Statistic AS VP Percent: 97.67 %
Brady Statistic RA Percent Paced: 0 %
Implantable Lead Implant Date: 20081209
Implantable Lead Location: 753860
Lead Channel Impedance Value: 4047 Ohm
Lead Channel Impedance Value: 4047 Ohm
Lead Channel Impedance Value: 494 Ohm
Lead Channel Impedance Value: 627 Ohm
Lead Channel Impedance Value: 760 Ohm
Lead Channel Pacing Threshold Amplitude: 1.25 V
Lead Channel Pacing Threshold Pulse Width: 0.8 ms
Lead Channel Setting Pacing Amplitude: 2.5 V
Lead Channel Setting Pacing Pulse Width: 0.4 ms
MDC IDC LEAD IMPLANT DT: 20160323
MDC IDC LEAD LOCATION: 753858
MDC IDC MSMT BATTERY REMAINING LONGEVITY: 54 mo
MDC IDC MSMT BATTERY VOLTAGE: 3 V
MDC IDC MSMT LEADCHNL LV IMPEDANCE VALUE: 4047 Ohm
MDC IDC MSMT LEADCHNL LV IMPEDANCE VALUE: 4047 Ohm
MDC IDC MSMT LEADCHNL RA IMPEDANCE VALUE: 4047 Ohm
MDC IDC MSMT LEADCHNL RV IMPEDANCE VALUE: 418 Ohm
MDC IDC MSMT LEADCHNL RV PACING THRESHOLD AMPLITUDE: 0.75 V
MDC IDC MSMT LEADCHNL RV PACING THRESHOLD PULSEWIDTH: 0.4 ms
MDC IDC SESS DTM: 20170608170848
MDC IDC SET LEADCHNL LV PACING PULSEWIDTH: 0.8 ms
MDC IDC SET LEADCHNL RV PACING AMPLITUDE: 2.5 V
MDC IDC SET LEADCHNL RV SENSING SENSITIVITY: 5.6 mV
MDC IDC STAT BRADY AP VP PERCENT: 0 %
MDC IDC STAT BRADY AS VS PERCENT: 2.33 %
MDC IDC STAT BRADY RV PERCENT PACED: 97.67 %

## 2015-06-27 LAB — POCT INR: INR: 1.8

## 2015-06-27 MED ORDER — CARVEDILOL 6.25 MG PO TABS: 6.2500 mg | ORAL_TABLET | Freq: Two times a day (BID) | ORAL | Status: AC

## 2015-06-27 NOTE — Progress Notes (Signed)
RN note reviewed. Agree with documention and plan. 

## 2015-06-27 NOTE — Telephone Encounter (Signed)
I reviewed his chart, it looks like the general surgeon referred patient to cardiology for cardiac clearance so I think it would be best if they offer a plan for coumadin cessation and bridging. The patient is potentially a candidate for bridging but there is also an argument to be had that he does not need to hold his coumadin significantly. Surgery would appreciate cardiology input. Please let cardiology know. Thanks

## 2015-06-27 NOTE — Telephone Encounter (Signed)
Caller name: Amy with Cardiology Can be reached: [phone]  Reason for call: They received surgical clearance form. They are wanting to know if pt can hold coumadin and for how long? Is bridging required? Who is to managing bridging?

## 2015-06-27 NOTE — Patient Instructions (Signed)
Per Dr. Charlett Blake: Take coumadin 2.5 mg Monday, Wednesday, Friday. Take coumadin 5 mg every other day. Recheck INR in 4 weeks.

## 2015-06-27 NOTE — Progress Notes (Signed)
Pre visit review using our clinic review tool, if applicable. No additional management support is needed unless otherwise documented below in the visit note.  INR 1.8. Pt requests to be called w/ changes after 3pm so he does not have to wait in office.  Per Dr. Charlett Blake: Take coumadin 2.5 mg Monday, Wednesday, Friday. Take coumadin 5 mg every other day. Recheck INR in 4 weeks.  4:32 PM Called pt and LMOM to return call for instructions. AVS mailed to pt w/ dosing instructions highlighted.  4:57 PM Called pt and left detailed message w/ dosing instructions and to call the office to schedule INR re-check.  Dorrene German, RN

## 2015-06-27 NOTE — Progress Notes (Signed)
Electrophysiology Office Note Date: 06/27/2015  ID:  [name], [name] January 20, 1927, MRN [id number]  PCP: Penni Homans, MD Primary Cardiologist: previously Mare Ferrari Electrophysiologist: Allred  CC: Pacemaker follow-up  [name] is a 82 y.o. male seen today for Dr Rayann Heman.  He presents today for routine electrophysiology followup.  He is also needing surgical clearance for cholecystectomy. Since last being seen in our clinic, the patient reports doing very well.  He remains very active working in his yard and has no functional limitations with ADL's or climbing stairs. He denies chest pain, palpitations, dyspnea, PND, orthopnea, nausea, vomiting, dizziness, syncope, edema, weight gain, or early satiety.  Device History: MDT CRTP implanted 2008 for CHF, s/p AVN ablation; gen change 2015, RV lead revision 2016 2/2 RV lead failure  Past Medical History  Diagnosis Date  . Bursitis     OF THE RIGHT SHOULDER  . Tinnitus   . Permanent atrial fibrillation (HCC)     s/p AV nodal ablation  . GERD (gastroesophageal reflux disease)   . MDS (myelodysplastic syndrome), low grade ([place]) 02/10/2011  . Iron overload, transfusional 02/10/2011  . Complete heart block ([place]) 12/28/06    s/p AV nodal ablation and BIV pacemaker implant at [facility] by Dr Rosita Fire  . CHF (congestive heart failure) ([place])   . H/O hiatal hernia   . Gout   . Preventative health care 09/09/2014    Dr Dorina Hoyer, urology, annually Dr Clover Mealy Dr Sharol Given Dr Ninfa Linden Dr Gershon Crane, opthamology Dr Rayann Heman cardiology Dr Marin Olp   . Kidney stones 1985  . Chronic kidney disease (CKD)     "they work at ~ 30%" (04/11/2014). Dr. Marcelline Deist released pt from office visit last week.   Past Surgical History  Procedure Laterality Date  . Total knee arthroplasty Bilateral     right Dec.2011,left June 2012  . Pilonidal cyst excision  1970's  . Av node ablation  12/08    at [facility]  . Bunionectomy Bilateral  11/2012- 01/2013    "left-right; great toes; gout and bunion OR" (02/06/2013)  . Inguinal hernia repair Right   . Lithotripsy  1985  . Cataract extraction w/ intraocular lens  implant, bilateral Bilateral   . Permanent pacemaker generator change N/A 04/11/2013    Procedure: PERMANENT PACEMAKER GENERATOR CHANGE;  Surgeon: Evans Lance, MD;  Location: [facility] CATH LAB;  Service: Cardiovascular;  Laterality: N/A;  . Kidney stone surgery  after 1985 kidney stone OR    "put a tube in my back to collect stones"  . Lead revision N/A 04/11/2014    Procedure: LEAD REVISION;  Surgeon: [name], MD;  Location: [facility] CATH LAB;  Service: Cardiovascular;  Laterality: N/A;  . Insert / replace / remove pacemaker  12/28/2006;     MDT CRTP implanted 2008 by Dr Rosita Fire at [facility]; gen change 03/2013 by Dr Lovena Le  . Eus N/A 11/21/2014    Procedure: UPPER ENDOSCOPIC ULTRASOUND (EUS) RADIAL;  Surgeon: Arta Silence, MD;  Location: [facility] ENDOSCOPY;  Service: Endoscopy;  Laterality: N/A;  . Ercp N/A 11/27/2014    Procedure: ENDOSCOPIC RETROGRADE CHOLANGIOPANCREATOGRAPHY (ERCP);  Surgeon: Clarene Essex, MD;  Location: [facility];  Service: Endoscopy;  Laterality: N/A;    Current Outpatient Prescriptions  Medication Sig Dispense Refill  . allopurinol (ZYLOPRIM) 300 MG tablet Take 1 tablet (300 mg total) by mouth at bedtime. 90 tablet 1  . carvedilol (COREG) 6.25 MG tablet TAKE ONE TABLET BY MOUTH TWICE DAILY WITH MEALS 180 tablet  0  . doxazosin (CARDURA) 4 MG tablet Take 4 mg by mouth at bedtime.     . famotidine (PEPCID) 40 MG tablet Take 80 mg by mouth daily.     . finasteride (PROSCAR) 5 MG tablet Take 5 mg by mouth every evening.     . furosemide (LASIX) 40 MG tablet TAKE ONE TABLET BY MOUTH ONCE DAILY 90 tablet 0  . Multiple Vitamin (MULTIVITAMIN WITH MINERALS) TABS tablet Take 1 tablet by mouth daily.    Marland Kitchen pyridoxine (B-6) 100 MG tablet Take 200 mg by mouth daily.     Marland Kitchen warfarin (COUMADIN) 5 MG tablet TAKE ONE TABLET BY  MOUTH ONCE DAILY AT  SUPPER  EXCEPT  ON  SUNDAY 90 tablet 0  . gabapentin (NEURONTIN) 300 MG capsule Take 300 mg by mouth at bedtime. Reported on 06/27/2015     No current facility-administered medications for this visit.    Allergies:   Methylprednisolone; Moxifloxacin hcl in nacl; and Ramipril   Social History: Social History   Social History  . Marital Status: Married    Spouse Name: N/A  . Number of Children: N/A  . Years of Education: N/A   Occupational History  . Not on file.   Social History Main Topics  . Smoking status: Former Smoker -- 1.00 packs/day for 25 years    Types: Cigarettes    Start date: 02/23/1944    Quit date: 02/10/1968  . Smokeless tobacco: Never Used  . Alcohol Use: 0.0 oz/week    0 Standard drinks or equivalent per week     Comment: 04/11/2014 "drank a little bit from age [age]". Denies use now  . Drug Use: No  . Sexual Activity: No     Comment: lives with wife, retired as a Scientist, research (life sciences), no dietary restrictions   Other Topics Concern  . Not on file   Social History Narrative    Family History: Family History  Problem Relation Age of Onset  . Cancer Mother   . Melanoma Mother   . COPD Father   . Kidney disease Brother   . Heart disease Maternal Grandfather   . Heart attack Neg Hx   . Hypertension Neg Hx   . Stroke Neg Hx      Review of Systems: All other systems reviewed and are otherwise negative except as noted above.   Physical Exam: VS:  BP 100/62 mmHg  Pulse 70  Ht 6\' 3"  (1.905 m)  Wt 206 lb (93.441 kg)  BMI 25.75 kg/m2 , BMI Body mass index is 25.75 kg/(m^2).  GEN- The patient is elderly appearing, alert and oriented x 3 today.   HEENT: normocephalic, atraumatic; sclera clear, conjunctiva pink; hearing intact; oropharynx clear; neck supple  Lungs- Clear to ausculation bilaterally, normal work of breathing.  No wheezes, rales, rhonchi Heart- Regular rate and rhythm (paced) GI- soft, non-tender, non-distended,  bowel sounds present  Extremities- no clubbing, cyanosis, or edema; DP/PT/radial pulses 2+ bilaterally MS- no significant deformity or atrophy Skin- warm and dry, no rash or lesion; PPM pocket well healed Psych- euthymic mood, full affect Neuro- strength and sensation are intact  PPM Interrogation- reviewed in detail today,  See PACEART report  EKG:  EKG is ordered today as part of surgical clearance evaluation and for evaluation of CRT pacing. The ekg ordered today shows atrial fibrillation with ventricular pacing   Recent Labs: 08/27/2014: TSH 2.51 03/12/2015: ALT 23; BUN 30*; Creatinine, Ser 1.84*; Potassium 4.1; Sodium 138 04/09/2015: HGB 10.9*;  Platelets 91*   Wt Readings from Last 3 Encounters:  06/27/15 206 lb (93.441 kg)  04/09/15 208 lb (94.348 kg)  03/12/15 209 lb (94.802 kg)     Other studies Reviewed: Additional studies/ records that were reviewed today include: Dr Rayann Heman and Dr Sherryl Barters office notes  Assessment and Plan:  1.  Chronic systolic heart failure Euvolemic on exam Continue current therapy  2.  Complete heart block S/p AVN ablation See Pace Art report No changes today LVtip-RV ring threshold today higher than LV tip to can.  Left programmed as is.   3.  Permanent atrial fibrillation S/p AV node ablation Continue Warfarin for CHADS2VASC of 4 No bleeding issues INR followed by PCP   4.  Surgical clearance The patient is able to obtain 4 mets without chest pain or shortness of breath. No further CV testing is required. He is aware that he is at increased risk 2/2 age.  Would continue BB through peri-operative period. We are available if needed He is pacemaker dependent.     Current medicines are reviewed at length with the patient today.   The patient does not have concerns regarding his medicines.  The following changes were made today:  none  Labs/ tests ordered today include: none  No orders of the defined types were placed in this  encounter.     Disposition:   Follow up with [facility], Dr Rayann Heman 6 months    Signed, Chanetta Marshall, NP 06/27/2015 2:08 PM  [facility] [address] [place] [id number] [phone] (office) [phone] (fax)

## 2015-06-27 NOTE — Patient Instructions (Signed)
Medication Instructions:   Your physician recommends that you continue on your current medications as directed. Please refer to the Current Medication list given to you today.   If you need a refill on your cardiac medications before your next appointment, please call your pharmacy.  Labwork:  NONE ORDER TODAY    Testing/Procedures:  NONE ORDER TODAY    Follow-Up:  Your physician wants you to follow-up in:  IN 6  MONTHS WITH DR  Rayann Heman  You will receive a reminder letter in the mail two months in advance. If you don't receive a letter, please call our office to schedule the follow-up appointment.  Remote monitoring is used to monitor your Pacemaker of ICD from home. This monitoring reduces the number of office visits required to check your device to one time per year. It allows [place] to keep an eye on the functioning of your device to ensure it is working properly. You are scheduled for a device check from home on .09/26/2015.Marland KitchenMarland KitchenYou may send your transmission at any time that day. If you have a wireless device, the transmission will be sent automatically. After your physician reviews your transmission, you will receive a postcard with your next transmission date.    Any Other Special Instructions Will Be Listed Below (If Applicable).

## 2015-06-27 NOTE — Addendum Note (Signed)
Addended by: Claude Manges on: 06/27/2015 02:44 PM   Modules accepted: Orders

## 2015-06-28 NOTE — Telephone Encounter (Signed)
Called before office opened, will try again.

## 2015-06-28 NOTE — Telephone Encounter (Signed)
Called [facility] and spoke to [facility].  Did inform of PCP response to request.  She stated they would then contact cardiology.

## 2015-07-02 NOTE — Telephone Encounter (Signed)
Dr. Dois Davenport office called stating Dr. Mare Ferrari has given cardiac clearance for this surgery.  But Dr. Mare Ferrari would prefer Dr.Blyth to address the coumadin.   Call back number to Dr. [name] is [phone] and Amy is his RN.

## 2015-07-04 NOTE — Telephone Encounter (Signed)
Patient with CHADSvasc of 4 and Caprini score 4 or more makes him a candidate for holding coumadin and bridging with Lovenox. He should hold Coumadin for 5 days prior to cholecystectomy and restart the day after surgery. Start Lovenox at 1 mg/kg divided bid the day coumadin is stopped and continue til INR is therapeutic again after surgery. We will need to have them call to arrange his follow up INR check after surgery so we can monitor once he gets home. His Lovenox dose will need to be 40 mg (per 0.30ml prefilled syringe) SQ bid. Disp # 16 doses. Please let surgery and patient know.

## 2015-07-04 NOTE — Telephone Encounter (Signed)
Reviewing for Dr. Charlett Blake who is out of office the rest of the week.  We could have him come to the office for these injections if it lines up the injections are to be given on a weekday. Another option would be to see if home health RN could be set up to help with this if patient has transportation issues and/or is homebound without the assistance of others. Could also train family member to administer the injections but they would both have to be checked off by RN in our office.

## 2015-07-04 NOTE — Telephone Encounter (Signed)
This did get to you I hope

## 2015-07-04 NOTE — Telephone Encounter (Addendum)
Provider's recommendations discussed with patient.  Pt states doesn't know when he's surgery will be and he does not feel comfortable giving himself injections.  He also says that he does not have anyone that would be able to give injections either.  Offered nurse visit in which a RN would instruct him or a family member on how to inject medication safely.  He declined.    Please advise.

## 2015-07-05 MED ORDER — ENOXAPARIN SODIUM 40 MG/0.4ML ~~LOC~~ SOLN: 40.0000 mg | Freq: Two times a day (BID) | SUBCUTANEOUS | Status: DC

## 2015-07-05 NOTE — Addendum Note (Signed)
Addended by: Rudene Anda on: 07/05/2015 04:29 PM   Modules accepted: Orders

## 2015-07-05 NOTE — Telephone Encounter (Signed)
Called patient and left a message for call back  

## 2015-07-05 NOTE — Telephone Encounter (Signed)
Pt returning your call, best # [phone].

## 2015-07-05 NOTE — Telephone Encounter (Addendum)
Called patient back.  [name]'s recommendations discussed with patient.  He says, "I guess I can give them to myself."  Pt was assured that we would be glad to show him how to administer medication and would provide any patient education he needs to feel confident in administering them.  He stated understanding, agreed with plan and requested that we send Rx to pharmacy.  He says he will schedule a nurse visit once he picks up syringes.  Rx sent to pharmacy.    Called surgery office at number:  [phone]. Spoke with Amy and made them aware.  Amy says she will call us with surgery date.

## 2015-07-08 ENCOUNTER — Encounter: Payer: Medicare Other | Admitting: Physician Assistant

## 2015-07-09 ENCOUNTER — Other Ambulatory Visit: Payer: Self-pay

## 2015-07-09 ENCOUNTER — Ambulatory Visit: Payer: Medicare Other | Admitting: Hematology & Oncology

## 2015-07-09 ENCOUNTER — Other Ambulatory Visit: Payer: Self-pay | Admitting: Internal Medicine

## 2015-07-09 ENCOUNTER — Other Ambulatory Visit: Payer: Medicare Other

## 2015-07-09 MED ORDER — FUROSEMIDE 40 MG PO TABS: 40.0000 mg | ORAL_TABLET | Freq: Every day | ORAL | Status: AC

## 2015-07-09 NOTE — Telephone Encounter (Signed)
Patient surgery is schedule for 07/16/15 with Dr [name]

## 2015-07-10 ENCOUNTER — Encounter: Payer: Self-pay | Admitting: Hematology & Oncology

## 2015-07-10 ENCOUNTER — Other Ambulatory Visit (HOSPITAL_BASED_OUTPATIENT_CLINIC_OR_DEPARTMENT_OTHER): Payer: Medicare Other

## 2015-07-10 ENCOUNTER — Ambulatory Visit (HOSPITAL_BASED_OUTPATIENT_CLINIC_OR_DEPARTMENT_OTHER): Payer: Medicare Other | Admitting: Hematology & Oncology

## 2015-07-10 VITALS — BP 118/67 | HR 86 | Temp 97.6°F | Resp 16 | Ht 75.0 in | Wt 206.0 lb

## 2015-07-10 DIAGNOSIS — I482 Chronic atrial fibrillation: Secondary | ICD-10-CM

## 2015-07-10 DIAGNOSIS — D4620 Refractory anemia with excess of blasts, unspecified: Secondary | ICD-10-CM

## 2015-07-10 DIAGNOSIS — N183 Chronic kidney disease, stage 3 unspecified: Secondary | ICD-10-CM

## 2015-07-10 DIAGNOSIS — G452 Multiple and bilateral precerebral artery syndromes: Secondary | ICD-10-CM

## 2015-07-10 DIAGNOSIS — G454 Transient global amnesia: Secondary | ICD-10-CM

## 2015-07-10 LAB — MANUAL DIFFERENTIAL (CHCC SATELLITE)
ALC: 2 10*3/uL (ref 0.9–3.3)
ANC (CHCC HP manual diff): 2.3 10*3/uL (ref 1.5–6.5)
LYMPH: 38 % (ref 14–48)
MONO: 19 % — ABNORMAL HIGH (ref 0–13)
PLT EST ~~LOC~~: DECREASED
SEG: 43 % (ref 40–75)

## 2015-07-10 LAB — COMPREHENSIVE METABOLIC PANEL
ALBUMIN: 3.8 g/dL (ref 3.5–5.0)
ALK PHOS: 74 U/L (ref 40–150)
ALT: 21 U/L (ref 0–55)
ANION GAP: 8 meq/L (ref 3–11)
AST: 20 U/L (ref 5–34)
BILIRUBIN TOTAL: 1.12 mg/dL (ref 0.20–1.20)
BUN: 23.6 mg/dL (ref 7.0–26.0)
CO2: 28 mEq/L (ref 22–29)
Calcium: 9.3 mg/dL (ref 8.4–10.4)
Chloride: 107 mEq/L (ref 98–109)
Creatinine: 1.8 mg/dL — ABNORMAL HIGH (ref 0.7–1.3)
EGFR: 34 mL/min/{1.73_m2} — AB (ref >90)
Glucose: 103 mg/dl (ref 70–140)
Potassium: 4 mEq/L (ref 3.5–5.1)
Sodium: 143 mEq/L (ref 136–145)
TOTAL PROTEIN: 7.4 g/dL (ref 6.4–8.3)

## 2015-07-10 LAB — CBC WITH DIFFERENTIAL (CANCER CENTER ONLY)
HCT: 34.1 % — ABNORMAL LOW (ref 38.7–49.9)
HEMOGLOBIN: 11.1 g/dL — AB (ref 13.0–17.1)
MCH: 31 pg (ref 28.0–33.4)
MCHC: 32.6 g/dL (ref 32.0–35.9)
MCV: 95 fL (ref 82–98)
Platelets: 91 10*3/uL — ABNORMAL LOW (ref 145–400)
RBC: 3.58 10*6/uL — ABNORMAL LOW (ref 4.20–5.70)
RDW: 14.7 % (ref 11.1–15.7)
WBC: 5.4 10*3/uL (ref 4.0–10.0)

## 2015-07-10 LAB — IRON AND TIBC
%SAT: 49 % (ref 20–55)
IRON: 90 ug/dL (ref 42–163)
TIBC: 184 ug/dL — AB (ref 202–409)
UIBC: 94 ug/dL — ABNORMAL LOW (ref 117–376)

## 2015-07-10 LAB — FERRITIN

## 2015-07-10 NOTE — Telephone Encounter (Signed)
Please call patient and schedule a nurse visit---Consult: to discuss how to properly administer Lovenox injections.  Thanks.

## 2015-07-10 NOTE — Patient Instructions (Addendum)
[name]  07/10/2015   Your procedure is scheduled on: 07-16-15  Report to [facility]  Entrance take [facility]  elevators to 3rd floor to  [place] at  800 AM.  Call this number if you have problems the morning of surgery [phone]   Remember: ONLY 1 PERSON MAY GO WITH YOU TO SHORT STAY TO GET  READY MORNING OF [place].  Do not eat food or drink liquids :After Midnight.     Take these medicines the morning of surgery with A SIP OF WATER: CARVEDILOL (COREG), FAMOTIDINE (PEPCID)             You may not have any metal on your body including hair pins and              piercings  Do not wear jewelry, make-up, lotions, powders or perfumes, deodorant             Do not wear nail polish.  Do not shave  48 hours prior to surgery.              Men may shave face and neck.   Do not bring valuables to the hospital. [place].  Contacts, dentures or bridgework may not be worn into surgery.  Leave suitcase in the car. After surgery it may be brought to your room.                 Please read over the following fact sheets you were given: _____________________________________________________________________             [facility] - Preparing for Surgery Before surgery, you can play an important role.  Because skin is not sterile, your skin needs to be as free of germs as possible.  You can reduce the number of germs on your skin by washing with CHG (chlorahexidine gluconate) soap before surgery.  CHG is an antiseptic cleaner which kills germs and bonds with the skin to continue killing germs even after washing. Please DO NOT use if you have an allergy to CHG or antibacterial soaps.  If your skin becomes reddened/irritated stop using the CHG and inform your nurse when you arrive at Short Stay. Do not shave (including legs and underarms) for at least 48 hours prior to the first CHG shower.  You may shave  your face/neck. Please follow these instructions carefully:  1.  Shower with CHG Soap the night before surgery and the  morning of Surgery.  2.  If you choose to wash your hair, wash your hair first as usual with your  normal  shampoo.  3.  After you shampoo, rinse your hair and body thoroughly to remove the  shampoo.                           4.  Use CHG as you would any other liquid soap.  You can apply chg directly  to the skin and wash                       Gently with a scrungie or clean washcloth.  5.  Apply the CHG Soap to your body ONLY FROM THE NECK DOWN.   Do not use on face/ open  Wound or open sores. Avoid contact with eyes, ears mouth and genitals (private parts).                       Wash face,  Genitals (private parts) with your normal soap.             6.  Wash thoroughly, paying special attention to the area where your surgery  will be performed.  7.  Thoroughly rinse your body with warm water from the neck down.  8.  DO NOT shower/wash with your normal soap after using and rinsing off  the CHG Soap.                9.  Pat yourself dry with a clean towel.            10.  Wear clean pajamas.            11.  Place clean sheets on your bed the night of your first shower and do not  sleep with pets. Day of Surgery : Do not apply any lotions/deodorants the morning of surgery.  Please wear clean clothes to the hospital/surgery center.  FAILURE TO FOLLOW THESE INSTRUCTIONS MAY RESULT IN THE CANCELLATION OF YOUR SURGERY PATIENT SIGNATURE_________________________________  NURSE SIGNATURE__________________________________  ________________________________________________________________________

## 2015-07-10 NOTE — Progress Notes (Signed)
He is  Hematology and Oncology Follow Up Visit  [name] [id number] 07-08-1927 82 y.o. 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  Marks is back for followup.  He is feeling quite good.   Unfortunately, he is going to have his gallbladder removed on Tuesday. He is off Coumadin right now. I don't see any problems with him having the gallbladder removed. His platelet count has been a little on the lower side but I don't think this is low enough that issue causing issues.  We are monitoring his iron studies. Back in March, his ferritin was 1463. His iron saturation was 56%.   He has had 3 gallbladder attacks. Currently, he feels okay. There is no abdominal pain. He's had no cough or shortness of breath. He has had no rashes.  Overall, his performance status is ECOG 1.  Medications:  Current outpatient prescriptions:  .  allopurinol (ZYLOPRIM) 300 MG tablet, Take 1 tablet (300 mg total) by mouth at bedtime., Disp: 90 tablet, Rfl: 1 .  carvedilol (COREG) 6.25 MG tablet, Take 1 tablet (6.25 mg total) by mouth 2 (two) times daily with a meal., Disp: 180 tablet, Rfl: 3 .  doxazosin (CARDURA) 4 MG tablet, Take 4 mg by mouth at bedtime. , Disp: , Rfl:  .  enoxaparin (LOVENOX) 40 MG/0.4ML injection, Inject 0.4 mLs (40 mg total) into the skin every 12 (twelve) hours., Disp: 16 Syringe, Rfl: 0 .  famotidine (PEPCID) 40 MG tablet, Take 80 mg by mouth daily. , Disp: , Rfl:  .  finasteride (PROSCAR) 5 MG tablet, Take 5 mg by mouth every evening. , Disp: , Rfl:  .  furosemide (LASIX) 40 MG tablet, Take 1 tablet (40 mg total) by mouth daily., Disp: 90 tablet, Rfl: 1 .  Multiple Vitamin (MULTIVITAMIN WITH MINERALS) TABS tablet, Take 1 tablet by mouth  daily., Disp: , Rfl:  .  pyridoxine (B-6) 100 MG tablet, Take 200 mg by mouth daily. , Disp: , Rfl:  .  warfarin (COUMADIN) 5 MG tablet, TAKE ONE TABLET BY MOUTH ONCE DAILY AT  SUPPER  EXCEPT  ON  SUNDAY, Disp: 90 tablet, Rfl: 0  Allergies:  Allergies  Allergen Reactions  . Methylprednisolone Other (See Comments)    Dizziness, passed out twice  . Moxifloxacin Hcl In Nacl Other (See Comments) and Itching    Pt c/o being "out of his head" & itching  . Ramipril Other (See Comments)    Renal function worsened on ACEi.    Past Medical History, Surgical history, Social history, and Family History were reviewed and updated.  Review of Systems: As above  Physical Exam:  height is 6\' 3"  (1.905 m) and weight is 206 lb (93.441 kg). His oral temperature is 97.6 F (36.4 C). His blood pressure is 118/67 and his pulse is 86. His respiration is 16.   Well-developed and well-nourished white woman. Head and neck exam shows no ocular or oral lesion.  He has no palpable cervical or supraclavicular lymph nodes. Lungs are clear. He does arouse, wheezes or rhonchi. Cardiac exam is regular rate and rhythm. He has an occasional extra beat. His he has a 1/6 systolic murmur. Abdomen is soft. He has good bowel sounds. There is no fluid wave.  There is no palpable liver or spleen tip. Extremities shows no clubbing, cyanosis or edema. His compression stockings on his legs. Skin exam shows no rashes, ecchymosis or petechia. Neurological exam shows no focal neurological deficits.  Lab Results  Component Value Date   WBC 5.4 07/10/2015   HGB 11.1* 07/10/2015   HCT 34.1* 07/10/2015   MCV 95 07/10/2015   PLT 91* 07/10/2015     Chemistry      Component Value Date/Time   NA 138 03/12/2015 1045   NA 146* 05/07/2014 1020   K 4.1 03/12/2015 1045   K 4.0 05/07/2014 1020   CL 103 03/12/2015 1045   CL 106 05/07/2014 1020   CO2 28 03/12/2015 1045   CO2 29 05/07/2014 1020   BUN 30* 03/12/2015 1045   BUN 21  05/07/2014 1020   CREATININE 1.84* 03/12/2015 1045   CREATININE 1.7* 05/07/2014 1020      Component Value Date/Time   CALCIUM 9.5 03/12/2015 1045   CALCIUM 9.4 05/07/2014 1020   ALKPHOS 80 03/12/2015 1045   ALKPHOS 76 05/07/2014 1020   AST 23 03/12/2015 1045   AST 27 05/07/2014 1020   ALT 23 03/12/2015 1045   ALT 19 05/07/2014 1020   BILITOT 1.1 03/12/2015 1045   BILITOT 1.40 05/07/2014 1020         Impression and Plan: [name] is a 82 year old gentleman. He has refractory anemia. We do not have to give him any Aranesp. He's done incredibly well with this.  On his blood smear, I do not see any evidence of transition over to leukemia.  I think that he is okay to have the cholecystectomy. His plantar count is a little bit on the lower side but I will think this is low enough that she caused him any problems. He is off the Coumadin and on Lovenox. The cardiologist is managing this.   We will have another appointment with him in 3 months.   We are watching his iron studies.I do not see that we have had to put him on any iron daily therapy right now. His iron saturation is not that bad.   [name], MD 6/21/201710:08 AM

## 2015-07-10 NOTE — Progress Notes (Signed)
CARDIAC CLEARANCE NOTE AMBER SEILER NP 06-27-15 EPIC LAST DEVICE CHECK NOTE 06-27-15 EPIC  EKG 06-27-15 EPIC

## 2015-07-11 ENCOUNTER — Ambulatory Visit (INDEPENDENT_AMBULATORY_CARE_PROVIDER_SITE_OTHER): Payer: Medicare Other | Admitting: Family Medicine

## 2015-07-11 ENCOUNTER — Encounter (HOSPITAL_COMMUNITY)
Admission: RE | Admit: 2015-07-11 | Discharge: 2015-07-11 | Disposition: A | Discharge status: Home / Self Care | Payer: Medicare Other | Source: Ambulatory Visit | Attending: General Surgery | Admitting: General Surgery

## 2015-07-11 ENCOUNTER — Encounter (HOSPITAL_COMMUNITY): Payer: Self-pay

## 2015-07-11 DIAGNOSIS — Z7189 Other specified counseling: Secondary | ICD-10-CM

## 2015-07-11 DIAGNOSIS — K811 Chronic cholecystitis: Secondary | ICD-10-CM | POA: Insufficient documentation

## 2015-07-11 DIAGNOSIS — Z01812 Encounter for preprocedural laboratory examination: Secondary | ICD-10-CM | POA: Diagnosis present

## 2015-07-11 HISTORY — DX: Personal history of other medical treatment: Z92.89

## 2015-07-11 HISTORY — DX: Anemia, unspecified: D64.9

## 2015-07-11 LAB — RETICULOCYTES: Reticulocyte Count: 0.4 % — ABNORMAL LOW (ref 0.6–2.6)

## 2015-07-11 LAB — APTT: aPTT: 55 seconds — ABNORMAL HIGH (ref 24–37)

## 2015-07-11 LAB — PROTIME-INR
INR: 2.13 — ABNORMAL HIGH (ref 0.00–1.49)
Prothrombin Time: 23.7 seconds — ABNORMAL HIGH (ref 11.6–15.2)

## 2015-07-11 NOTE — Progress Notes (Signed)
Pre visit review using our clinic review tool, if applicable. No additional management support is needed unless otherwise documented below in the visit note.  Pt here for teaching on Lovenox injections per 06/27/15 phone note.   Per Dr. Charlett Blake, pt is to take Lovenox 40 mg SQ BID starting 5 days before surgery. Surgery is scheduled for 07/16/15. Pt states his greatest concern is "sticking himself." Teaching provided to pt w/ teach back and demonstration of injection in office. Pt demonstrated proper technique of SQ injection, including locating injection site, proper cleansing of skin prior to injection, injection technique, use of pre-filled syringe, activation of safety post-injection, and sharps disposal. Educated pt that some bruising may occur at injection site. He verbalized understanding and repeated back dosing instructions, including when his next dose is due. He had no further questions or concerns prior to leaving the office. He will call to schedule INR check as soon as he is discharged from hospital.  Dorrene German, RN   RN note reviewed. Agree with documention and plan.

## 2015-07-11 NOTE — Progress Notes (Signed)
CBC/DIFF/CMP done 07/10/15 sent via EPIC to Dr Greer Pickerel.

## 2015-07-11 NOTE — Progress Notes (Addendum)
Patient came to preop appt .  At preop appointment patient stated he had been given no instructions regarding Lovenox Injections and has never received Lovenox before.  Patient had single dose of Lovenox with him and stated he was tole we would instruct him regarding the Lovenox.  Patient stated that office of Dr Randel Pigg had prescribed the Lovenox.  Patient stated he had instructions at home.  No instructions noted in EPIC regarding Lovenox.  I informed patient that since Dr Randel Pigg office had prescribed the Lovenox that they would need to instruct him.  I instructed patient to go directly to office of Dr Randel Pigg and that I would call them and inform the office that patient would be coming over to be instructed on Lovenox injections.  Patient voiced understanding. Spoke with Hoyle Sauer ( nurse) at office and she is aware patient is on his way to office to get instructions regarding Lovenox.  Hoyle Sauer stated that patient was supposed to make a nurse appointment at office to receive instructions regarding Lovenox and patient was instructed this.  I told her patient was on way to office at this time.  Hoyle Sauer ( nurse ) voiced understanding.

## 2015-07-11 NOTE — Progress Notes (Signed)
RN note reviewed. Agree with documention and plan. 

## 2015-07-11 NOTE — Telephone Encounter (Signed)
Pt presented to [place] today and requested Lovenox teaching. I received call from Sherlene Shams, RN at [facility]. She instructed pt to come straight to our office for Lovenox teaching. Pt placed on nurse visit schedule today at 2pm. However, we will provide teaching to pt whenever he arrives.

## 2015-07-11 NOTE — Progress Notes (Signed)
Device check- 06/27/15- EPIC  LOV- Chanetta Marshall, NP- 06/27/15- EPIC  EKG- 06/27/15- EPIC

## 2015-07-12 ENCOUNTER — Ambulatory Visit: Payer: Self-pay | Admitting: General Surgery

## 2015-07-12 ENCOUNTER — Other Ambulatory Visit: Payer: Self-pay | Admitting: General Surgery

## 2015-07-15 ENCOUNTER — Telehealth: Payer: Self-pay | Admitting: Family Medicine

## 2015-07-15 NOTE — Telephone Encounter (Signed)
°  Relationship to patient: Self  Can be reached: [phone] Pharmacy:  Reason for call: Patient would like call back to find out when he should stop his Levonox injections for his surgery tomorrow. States he will take another injection at 9 a.m. This morning

## 2015-07-15 NOTE — Telephone Encounter (Signed)
He should take his doses today and not tomorrow, surgery will guide his anticoagulation during surgical period.

## 2015-07-15 NOTE — Telephone Encounter (Signed)
Patient informed of lovenox instructions per PCP instructions. The patient did verbalize understanding.

## 2015-07-16 ENCOUNTER — Ambulatory Visit (HOSPITAL_COMMUNITY): Payer: Medicare Other

## 2015-07-16 ENCOUNTER — Encounter (HOSPITAL_COMMUNITY): Payer: Self-pay

## 2015-07-16 ENCOUNTER — Observation Stay (HOSPITAL_COMMUNITY)
Admission: RE | Admit: 2015-07-16 | Discharge: 2015-07-17 | Disposition: A | Discharge status: Home / Self Care | Payer: Medicare Other | Source: Ambulatory Visit | Attending: General Surgery | Admitting: General Surgery

## 2015-07-16 ENCOUNTER — Ambulatory Visit (HOSPITAL_COMMUNITY): Payer: Medicare Other | Admitting: Anesthesiology

## 2015-07-16 ENCOUNTER — Encounter (HOSPITAL_COMMUNITY)
Admission: RE | Disposition: A | Discharge status: Home / Self Care | Payer: Self-pay | Source: Ambulatory Visit | Attending: General Surgery

## 2015-07-16 DIAGNOSIS — I482 Chronic atrial fibrillation: Secondary | ICD-10-CM | POA: Diagnosis not present

## 2015-07-16 DIAGNOSIS — K8010 Calculus of gallbladder with chronic cholecystitis without obstruction: Principal | ICD-10-CM | POA: Insufficient documentation

## 2015-07-16 DIAGNOSIS — Z87891 Personal history of nicotine dependence: Secondary | ICD-10-CM | POA: Insufficient documentation

## 2015-07-16 DIAGNOSIS — Z7901 Long term (current) use of anticoagulants: Secondary | ICD-10-CM | POA: Diagnosis not present

## 2015-07-16 DIAGNOSIS — K829 Disease of gallbladder, unspecified: Secondary | ICD-10-CM

## 2015-07-16 DIAGNOSIS — M1990 Unspecified osteoarthritis, unspecified site: Secondary | ICD-10-CM | POA: Insufficient documentation

## 2015-07-16 DIAGNOSIS — K8020 Calculus of gallbladder without cholecystitis without obstruction: Secondary | ICD-10-CM | POA: Diagnosis present

## 2015-07-16 DIAGNOSIS — D649 Anemia, unspecified: Secondary | ICD-10-CM | POA: Diagnosis not present

## 2015-07-16 DIAGNOSIS — Z79899 Other long term (current) drug therapy: Secondary | ICD-10-CM | POA: Insufficient documentation

## 2015-07-16 DIAGNOSIS — N189 Chronic kidney disease, unspecified: Secondary | ICD-10-CM | POA: Insufficient documentation

## 2015-07-16 DIAGNOSIS — Z950 Presence of cardiac pacemaker: Secondary | ICD-10-CM | POA: Diagnosis not present

## 2015-07-16 DIAGNOSIS — K219 Gastro-esophageal reflux disease without esophagitis: Secondary | ICD-10-CM | POA: Diagnosis not present

## 2015-07-16 DIAGNOSIS — I5022 Chronic systolic (congestive) heart failure: Secondary | ICD-10-CM | POA: Insufficient documentation

## 2015-07-16 DIAGNOSIS — Z9049 Acquired absence of other specified parts of digestive tract: Secondary | ICD-10-CM

## 2015-07-16 HISTORY — PX: CHOLECYSTECTOMY: SHX55

## 2015-07-16 LAB — PROTIME-INR
INR: 1.25 (ref 0.00–1.49)
Prothrombin Time: 15.9 seconds — ABNORMAL HIGH (ref 11.6–15.2)

## 2015-07-16 SURGERY — LAPAROSCOPIC CHOLECYSTECTOMY WITH INTRAOPERATIVE CHOLANGIOGRAM
Anesthesia: General

## 2015-07-16 MED ORDER — IOHEXOL 300 MG/ML  SOLN
INTRAMUSCULAR | Status: DC | PRN
  Administered 2015-07-16: 5 mL

## 2015-07-16 MED ORDER — FINASTERIDE 5 MG PO TABS
5.0000 mg | ORAL_TABLET | Freq: Every evening | ORAL | Status: DC
  Administered 2015-07-16: 5 mg via ORAL
  Filled 2015-07-16: qty 1

## 2015-07-16 MED ORDER — ONDANSETRON 4 MG PO TBDP: 4.0000 mg | ORAL_TABLET | Freq: Four times a day (QID) | ORAL | Status: DC | PRN

## 2015-07-16 MED ORDER — FENTANYL CITRATE (PF) 100 MCG/2ML IJ SOLN
25.0000 ug | INTRAMUSCULAR | Status: DC | PRN
  Administered 2015-07-16 (×2): 50 ug via INTRAVENOUS

## 2015-07-16 MED ORDER — FUROSEMIDE 40 MG PO TABS
40.0000 mg | ORAL_TABLET | Freq: Every day | ORAL | Status: DC
  Administered 2015-07-16 – 2015-07-17 (×2): 40 mg via ORAL
  Filled 2015-07-16 (×2): qty 1

## 2015-07-16 MED ORDER — LACTATED RINGERS IV SOLN
INTRAVENOUS | Status: DC
  Administered 2015-07-16: 1000 mL via INTRAVENOUS

## 2015-07-16 MED ORDER — ACETAMINOPHEN 325 MG PO TABS
650.0000 mg | ORAL_TABLET | Freq: Four times a day (QID) | ORAL | Status: DC | PRN
  Administered 2015-07-16: 650 mg via ORAL
  Filled 2015-07-16: qty 2

## 2015-07-16 MED ORDER — METHOCARBAMOL 500 MG PO TABS: 500.0000 mg | ORAL_TABLET | Freq: Four times a day (QID) | ORAL | Status: DC | PRN

## 2015-07-16 MED ORDER — ENOXAPARIN SODIUM 40 MG/0.4ML ~~LOC~~ SOLN
40.0000 mg | SUBCUTANEOUS | Status: DC
  Administered 2015-07-17: 40 mg via SUBCUTANEOUS
  Filled 2015-07-16: qty 0.4

## 2015-07-16 MED ORDER — CEFOTETAN DISODIUM-DEXTROSE 2-2.08 GM-% IV SOLR
2.0000 g | INTRAVENOUS | Status: AC
  Administered 2015-07-16: 2 g via INTRAVENOUS

## 2015-07-16 MED ORDER — BUPIVACAINE-EPINEPHRINE 0.25% -1:200000 IJ SOLN
INTRAMUSCULAR | Status: AC
Start: 2015-07-16 — End: 2015-07-16
  Filled 2015-07-16: qty 1

## 2015-07-16 MED ORDER — DIPHENHYDRAMINE HCL 50 MG/ML IJ SOLN: 12.5000 mg | Freq: Four times a day (QID) | INTRAMUSCULAR | Status: DC | PRN

## 2015-07-16 MED ORDER — ACETAMINOPHEN 650 MG RE SUPP: 650.0000 mg | Freq: Four times a day (QID) | RECTAL | Status: DC | PRN

## 2015-07-16 MED ORDER — FENTANYL CITRATE (PF) 100 MCG/2ML IJ SOLN
INTRAMUSCULAR | Status: DC | PRN
  Administered 2015-07-16 (×4): 25 ug via INTRAVENOUS

## 2015-07-16 MED ORDER — LACTATED RINGERS IV SOLN
INTRAVENOUS | Status: DC | PRN
  Administered 2015-07-16: 1 via INTRAVENOUS

## 2015-07-16 MED ORDER — BUPIVACAINE-EPINEPHRINE 0.25% -1:200000 IJ SOLN
INTRAMUSCULAR | Status: DC | PRN
  Administered 2015-07-16: 20 mL

## 2015-07-16 MED ORDER — FENTANYL CITRATE (PF) 100 MCG/2ML IJ SOLN
INTRAMUSCULAR | Status: AC
  Filled 2015-07-16: qty 2

## 2015-07-16 MED ORDER — IOPAMIDOL (ISOVUE-300) INJECTION 61%
INTRAVENOUS | Status: AC
  Filled 2015-07-16: qty 50

## 2015-07-16 MED ORDER — DEXAMETHASONE SODIUM PHOSPHATE 10 MG/ML IJ SOLN
INTRAMUSCULAR | Status: AC
  Filled 2015-07-16: qty 1

## 2015-07-16 MED ORDER — PHENYLEPHRINE 40 MCG/ML (10ML) SYRINGE FOR IV PUSH (FOR BLOOD PRESSURE SUPPORT)
PREFILLED_SYRINGE | INTRAVENOUS | Status: AC
  Filled 2015-07-16: qty 10

## 2015-07-16 MED ORDER — CEFOTETAN DISODIUM-DEXTROSE 2-2.08 GM-% IV SOLR
INTRAVENOUS | Status: AC
  Filled 2015-07-16: qty 50

## 2015-07-16 MED ORDER — SUGAMMADEX SODIUM 200 MG/2ML IV SOLN
INTRAVENOUS | Status: AC
  Filled 2015-07-16: qty 2

## 2015-07-16 MED ORDER — ONDANSETRON HCL 4 MG/2ML IJ SOLN
INTRAMUSCULAR | Status: DC | PRN
  Administered 2015-07-16: 4 mg via INTRAVENOUS

## 2015-07-16 MED ORDER — POTASSIUM CHLORIDE IN NACL 20-0.9 MEQ/L-% IV SOLN
INTRAVENOUS | Status: DC
  Administered 2015-07-16: 13:00:00 via INTRAVENOUS
  Filled 2015-07-16 (×2): qty 1000

## 2015-07-16 MED ORDER — PANTOPRAZOLE SODIUM 40 MG PO TBEC
40.0000 mg | DELAYED_RELEASE_TABLET | Freq: Every day | ORAL | Status: DC
  Administered 2015-07-16 – 2015-07-17 (×2): 40 mg via ORAL
  Filled 2015-07-16 (×2): qty 1

## 2015-07-16 MED ORDER — MORPHINE SULFATE (PF) 2 MG/ML IV SOLN: 1.0000 mg | INTRAVENOUS | Status: DC | PRN

## 2015-07-16 MED ORDER — LIDOCAINE HCL (CARDIAC) 20 MG/ML IV SOLN
INTRAVENOUS | Status: AC
  Filled 2015-07-16: qty 5

## 2015-07-16 MED ORDER — OXYCODONE HCL 5 MG PO TABS
2.5000 mg | ORAL_TABLET | ORAL | Status: DC | PRN
  Administered 2015-07-16 – 2015-07-17 (×2): 5 mg via ORAL
  Filled 2015-07-16 (×2): qty 1

## 2015-07-16 MED ORDER — SUGAMMADEX SODIUM 200 MG/2ML IV SOLN
INTRAVENOUS | Status: DC | PRN
Start: 2015-07-16 — End: 2015-07-16
  Administered 2015-07-16: 200 mg via INTRAVENOUS

## 2015-07-16 MED ORDER — PROPOFOL 10 MG/ML IV BOLUS
INTRAVENOUS | Status: AC
  Filled 2015-07-16: qty 20

## 2015-07-16 MED ORDER — DOCUSATE SODIUM 100 MG PO CAPS
100.0000 mg | ORAL_CAPSULE | Freq: Two times a day (BID) | ORAL | Status: DC
  Administered 2015-07-17 (×2): 100 mg via ORAL
  Filled 2015-07-16 (×2): qty 1

## 2015-07-16 MED ORDER — DIPHENHYDRAMINE HCL 12.5 MG/5ML PO ELIX: 12.5000 mg | ORAL_SOLUTION | Freq: Four times a day (QID) | ORAL | Status: DC | PRN

## 2015-07-16 MED ORDER — ONDANSETRON HCL 4 MG/2ML IJ SOLN
4.0000 mg | Freq: Four times a day (QID) | INTRAMUSCULAR | Status: DC | PRN
  Administered 2015-07-17: 4 mg via INTRAVENOUS
  Filled 2015-07-16: qty 2

## 2015-07-16 MED ORDER — CARVEDILOL 6.25 MG PO TABS
6.2500 mg | ORAL_TABLET | Freq: Two times a day (BID) | ORAL | Status: DC
  Administered 2015-07-16 – 2015-07-17 (×2): 6.25 mg via ORAL
  Filled 2015-07-16 (×2): qty 1

## 2015-07-16 MED ORDER — BUPIVACAINE-EPINEPHRINE 0.25% -1:200000 IJ SOLN
INTRAMUSCULAR | Status: AC
  Filled 2015-07-16: qty 1

## 2015-07-16 MED ORDER — PROPOFOL 10 MG/ML IV BOLUS
INTRAVENOUS | Status: DC | PRN
  Administered 2015-07-16: 150 mg via INTRAVENOUS

## 2015-07-16 MED ORDER — ROCURONIUM BROMIDE 100 MG/10ML IV SOLN
INTRAVENOUS | Status: AC
  Filled 2015-07-16: qty 1

## 2015-07-16 MED ORDER — ONDANSETRON HCL 4 MG/2ML IJ SOLN
INTRAMUSCULAR | Status: AC
  Filled 2015-07-16: qty 2

## 2015-07-16 MED ORDER — CETYLPYRIDINIUM CHLORIDE 0.05 % MT LIQD
7.0000 mL | Freq: Two times a day (BID) | OROMUCOSAL | Status: DC
  Administered 2015-07-16 (×2): 7 mL via OROMUCOSAL

## 2015-07-16 MED ORDER — LIDOCAINE HCL (CARDIAC) 20 MG/ML IV SOLN
INTRAVENOUS | Status: DC | PRN
  Administered 2015-07-16: 80 mg via INTRATRACHEAL

## 2015-07-16 MED ORDER — PHENYLEPHRINE HCL 10 MG/ML IJ SOLN
INTRAMUSCULAR | Status: DC | PRN
  Administered 2015-07-16 (×2): 80 ug via INTRAVENOUS

## 2015-07-16 MED ORDER — DOXAZOSIN MESYLATE 4 MG PO TABS
4.0000 mg | ORAL_TABLET | Freq: Every day | ORAL | Status: DC
  Administered 2015-07-16: 4 mg via ORAL
  Filled 2015-07-16: qty 1

## 2015-07-16 MED ORDER — ROCURONIUM BROMIDE 100 MG/10ML IV SOLN
INTRAVENOUS | Status: DC | PRN
  Administered 2015-07-16: 10 mg via INTRAVENOUS
  Administered 2015-07-16: 40 mg via INTRAVENOUS
  Administered 2015-07-16: 5 mg via INTRAVENOUS

## 2015-07-16 SURGICAL SUPPLY — 51 items
ADH SKN CLS APL DERMABOND .7 (GAUZE/BANDAGES/DRESSINGS) ×1
APL SRG 38 LTWT LNG FL B (MISCELLANEOUS) ×1
APPLICATOR ARISTA FLEXITIP XL (MISCELLANEOUS) ×2 IMPLANT
APPLIER CLIP 5 13 M/L LIGAMAX5 (MISCELLANEOUS) ×3
APPLIER CLIP ROT 10 11.4 M/L (STAPLE)
APR CLP MED LRG 11.4X10 (STAPLE)
APR CLP MED LRG 5 ANG JAW (MISCELLANEOUS) ×1
BAG SPEC RTRVL 10 TROC 200 (ENDOMECHANICALS) ×1
CABLE HIGH FREQUENCY MONO STRZ (ELECTRODE) ×3 IMPLANT
CHLORAPREP W/TINT 26ML (MISCELLANEOUS) ×3 IMPLANT
CLIP APPLIE 5 13 M/L LIGAMAX5 (MISCELLANEOUS) ×1 IMPLANT
CLIP APPLIE ROT 10 11.4 M/L (STAPLE) IMPLANT
CLOSURE STERI-STRIP 1/4X4 (GAUZE/BANDAGES/DRESSINGS) ×2 IMPLANT
COVER MAYO STAND STRL (DRAPES) IMPLANT
COVER SURGICAL LIGHT HANDLE (MISCELLANEOUS) ×3 IMPLANT
DECANTER SPIKE VIAL GLASS SM (MISCELLANEOUS) ×3 IMPLANT
DERMABOND ADVANCED (GAUZE/BANDAGES/DRESSINGS) ×2
DERMABOND ADVANCED .7 DNX12 (GAUZE/BANDAGES/DRESSINGS) ×1 IMPLANT
DEVICE TROCAR PUNCTURE CLOSURE (ENDOMECHANICALS) ×2 IMPLANT
DRAPE C-ARM 42X120 X-RAY (DRAPES) IMPLANT
DRAPE LAPAROSCOPIC ABDOMINAL (DRAPES) IMPLANT
DRSG TEGADERM 4X4.75 (GAUZE/BANDAGES/DRESSINGS) ×2 IMPLANT
ELECT PENCIL ROCKER SW 15FT (MISCELLANEOUS) ×3 IMPLANT
ELECT REM PT RETURN 9FT ADLT (ELECTROSURGICAL) ×3
ELECTRODE REM PT RTRN 9FT ADLT (ELECTROSURGICAL) ×1 IMPLANT
GAUZE SPONGE 4X4 12PLY STRL (GAUZE/BANDAGES/DRESSINGS) ×2 IMPLANT
GLOVE BIO SURGEON STRL SZ7.5 (GLOVE) ×3 IMPLANT
GLOVE INDICATOR 8.0 STRL GRN (GLOVE) ×3 IMPLANT
GOWN STRL REUS W/TWL XL LVL3 (GOWN DISPOSABLE) ×9 IMPLANT
HEMOSTAT ARISTA ABSORB 3G PWDR (MISCELLANEOUS) ×2 IMPLANT
HEMOSTAT SNOW SURGICEL 2X4 (HEMOSTASIS) IMPLANT
KIT BASIN OR (CUSTOM PROCEDURE TRAY) ×3 IMPLANT
L-HOOK LAP DISP 36CM (ELECTROSURGICAL) ×3
LHOOK LAP DISP 36CM (ELECTROSURGICAL) ×1 IMPLANT
POSITIONER SURGICAL ARM (MISCELLANEOUS) IMPLANT
POUCH RETRIEVAL ECOSAC 10 (ENDOMECHANICALS) ×1 IMPLANT
POUCH RETRIEVAL ECOSAC 10MM (ENDOMECHANICALS) ×2
SCISSORS LAP 5X35 DISP (ENDOMECHANICALS) ×3 IMPLANT
SET CHOLANGIOGRAPH MIX (MISCELLANEOUS) IMPLANT
SET IRRIG TUBING LAPAROSCOPIC (IRRIGATION / IRRIGATOR) ×3 IMPLANT
SLEEVE XCEL OPT CAN 5 100 (ENDOMECHANICALS) ×6 IMPLANT
SUT MNCRL AB 4-0 PS2 18 (SUTURE) ×3 IMPLANT
SUT VICRYL 0 UR6 27IN ABS (SUTURE) IMPLANT
TAPE CLOTH 4X10 WHT NS (GAUZE/BANDAGES/DRESSINGS) IMPLANT
TOWEL OR 17X26 10 PK STRL BLUE (TOWEL DISPOSABLE) ×3 IMPLANT
TOWEL OR NON WOVEN STRL DISP B (DISPOSABLE) ×3 IMPLANT
TRAY LAPAROSCOPIC (CUSTOM PROCEDURE TRAY) ×3 IMPLANT
TROCAR BLADELESS OPT 5 100 (ENDOMECHANICALS) ×3 IMPLANT
TROCAR XCEL BLUNT TIP 100MML (ENDOMECHANICALS) ×3 IMPLANT
TROCAR XCEL NON-BLD 11X100MML (ENDOMECHANICALS) IMPLANT
TUBING INSUF HEATED (TUBING) ×3 IMPLANT

## 2015-07-16 NOTE — Interval H&P Note (Signed)
History and Physical Interval Note:  07/16/2015 9:29 AM  [name]  has presented today for surgery, with the diagnosis of chronic cholecystitis  The various methods of treatment have been discussed with the patient and family. After consideration of risks, benefits and other options for treatment, the patient has consented to  Procedure(s): LAPAROSCOPIC CHOLECYSTECTOMY WITH INTRAOPERATIVE CHOLANGIOGRAM (N/A) as a surgical intervention .  The patient's history has been reviewed, patient examined, no change in status, stable for surgery.  I have reviewed the patient's chart and labs.  Questions were answered to the patient's satisfaction.     [name]. [name], MD, [place], Bariatric, & Minimally Invasive Surgery [facility] Surgery, [place]  [facility]

## 2015-07-16 NOTE — H&P (Signed)
[name] 06/14/2015 4:30 PM Location: [facility] Patient #: [id number] DOB: 10-31-1927 Married / Language: [name] / Race: White Male   History of Present Illness [name]; 06/16/2015 5:08 PM) The patient is a 82 year old male who presents for evaluation of gall stones. He is referred by [name] for evaluation for cholecystectomy. He presented to the hospital back in early November 2016 with upper abdominal pain, right-sided abdominal pain and was found to have elevated liver function tests. He was found to have a common bile duct stone. He underwent ERCP with sphincterotomy on November 8 by [name]. The few common bile duct stones were removed. He states that he did well for a while. However he relates at least 2 distinctive additional episodes one in April and one in May that he describes as bad attacks. They are in the upper abdomen day or so she was nausea and and sometimes emesis. He denies any fevers or chills. He denies any unplanned weight loss. He denies any jaundice. He has a pacemaker. He also is on Coumadin. He denies any chest pain, chest pressure, shortness of breath, orthopnea, paroxysmal nocturnal dyspnea, TIAs or amaurosis fugax. He denies any significant peripheral edema. He had a lower midline incision in 1985 for kidney stones   Problem List/Past Medical [name]; 06/16/2015 5:11 PM) ANTICOAGULATED ON COUMADIN (Z51.81) SYMPTOMATIC CHOLELITHIASIS (K80.20) PACEMAKER (Z95.0) ATRIAL FIBRILLATION, CHRONIC (I48.2)  Other Problems [name]; 06/16/2015 5:11 PM) Arthritis Congestive Heart Failure Enlarged Prostate Gastroesophageal Reflux Disease Transfusion history  Past Surgical History [name], [name]; 06/14/2015 4:32 PM) Knee Surgery Left. Oral Surgery  Diagnostic Studies History [name], [name]; 06/14/2015 4:32 PM) Colonoscopy 5-10 years ago  Allergies [name], [name]; 06/14/2015 4:33  PM) MethylPREDNISolone *CORTICOSTEROIDS* Dizziness. Moxifloxacin HCl *CHEMICALS* Ramipril *ANTIHYPERTENSIVES*  Medication History [name], [name]; 06/14/2015 4:36 PM) Allopurinol (300MG  Tablet, Oral) Active. Carvedilol (6.25MG  Tablet, Oral) Active. Doxazosin Mesylate (4MG  Tablet, Oral) Active. Famotidine (40MG  Tablet, Oral) Active. Finasteride (5MG  Tablet, Oral) Active. Furosemide (40MG  Tablet, Oral) Active. Multiple Vitamins/Minerals (Oral) Active. Pyridoxine HCl (100MG  Tablet ER, Oral) Active. Warfarin Sodium (5MG  Tablet, Oral) Active. Medications Reconciled  Social History [name], [name]; 06/14/2015 4:32 PM) Caffeine use Carbonated beverages. No drug use Tobacco use Former smoker.  Family History [name], [name]; 06/14/2015 4:32 PM) Colon Cancer Mother. Kidney Disease Mother.    Review of Systems [name] [name]; 06/14/2015 4:32 PM) General Not Present- Appetite Loss, Chills, Fatigue, Fever, Night Sweats, Weight Gain and Weight Loss. Skin Not Present- Change in Wart/Mole, Dryness, Hives, Jaundice, New Lesions, Non-Healing Wounds, Rash and Ulcer. HEENT Present- Hearing Loss, Ringing in the Ears, Wears glasses/contact lenses and Yellow Eyes. Not Present- Earache, Hoarseness, Nose Bleed, Oral Ulcers, Seasonal Allergies, Sinus Pain, Sore Throat and Visual Disturbances. Respiratory Not Present- Bloody sputum, Chronic Cough, Difficulty Breathing, Snoring and Wheezing. Breast Not Present- Breast Mass, Breast Pain, Nipple Discharge and Skin Changes. Cardiovascular Not Present- Chest Pain, Difficulty Breathing Lying Down, Leg Cramps, Palpitations, Rapid Heart Rate, Shortness of Breath and Swelling of Extremities. Gastrointestinal Not Present- Abdominal Pain, Bloating, Bloody Stool, Change in Bowel Habits, Chronic diarrhea, Constipation, Difficulty Swallowing, Excessive gas, Gets full quickly at meals, Hemorrhoids, Indigestion, Nausea, Rectal Pain and Vomiting. Male  Genitourinary Present- Impotence. Not Present- Blood in Urine, Change in Urinary Stream, Frequency, Nocturia, Painful Urination, Urgency and Urine Leakage. Musculoskeletal Present- Joint Pain. Not Present- Back Pain, Joint Stiffness, Muscle Pain, Muscle Weakness and Swelling of  Extremities. Neurological Not Present- Decreased Memory, Fainting, Headaches, Numbness, Seizures, Tingling, Tremor, Trouble walking and Weakness. Psychiatric Not Present- Anxiety, Bipolar, Change in Sleep Pattern, Depression, Fearful and Frequent crying. Endocrine Not Present- Cold Intolerance, Excessive Hunger, Hair Changes, Heat Intolerance, Hot flashes and New Diabetes. Hematology Present- Easy Bruising. Not Present- Excessive bleeding, Gland problems, HIV and Persistent Infections.  Vitals [name] [name]; 06/14/2015 4:36 PM) 06/14/2015 4:36 PM Weight: 207 lb Height: 75in Body Surface Area: 2.23 m Body Mass Index: 25.87 kg/m  Temp.: 97.48F  Pulse: 85 (Regular)  BP: 126/72 (Sitting, Left Arm, Standard)       Physical Exam [name]; 06/16/2015 5:09 PM) General Mental Status-Alert. General Appearance-Consistent with stated age. Hydration-Well hydrated. Voice-Normal.  Head and Neck Head-normocephalic, atraumatic with no lesions or palpable masses. Trachea-midline. Thyroid Gland Characteristics - normal size and consistency.  Eye Eyeball - Bilateral-Extraocular movements intact. Sclera/Conjunctiva - Bilateral-No scleral icterus.  Chest and Lung Exam Chest and lung exam reveals -quiet, even and easy respiratory effort with no use of accessory muscles and on auscultation, normal breath sounds, no adventitious sounds and normal vocal resonance. Inspection Chest Wall - Normal. Back - normal.  Breast - Did not examine.  Cardiovascular Cardiovascular examination reveals -normal heart sounds, regular rate and rhythm with no murmurs and normal pedal pulses  bilaterally. Note: pacemaker   Abdomen Inspection Inspection of the abdomen reveals - No Hernias. Skin - Scar - Note: lower midline. Palpation/Percussion Palpation and Percussion of the abdomen reveal - Soft, Non Tender, No Rebound tenderness, No Rigidity (guarding) and No hepatosplenomegaly. Auscultation Auscultation of the abdomen reveals - Bowel sounds normal.  Peripheral Vascular Upper Extremity Palpation - Pulses bilaterally normal.  Neurologic Neurologic evaluation reveals -alert and oriented x 3 with no impairment of recent or remote memory. Mental Status-Normal.  Neuropsychiatric The patient's mood and affect are described as -normal. Judgment and Insight-insight is appropriate concerning matters relevant to self.  Musculoskeletal Normal Exam - Left-Upper Extremity Strength Normal and Lower Extremity Strength Normal. Normal Exam - Right-Upper Extremity Strength Normal and Lower Extremity Strength Normal.  Lymphatic Head & Neck  General Head & Neck Lymphatics: Bilateral - Description - Normal. Axillary - Did not examine. Femoral & Inguinal - Did not examine.    Assessment & Plan Henry Hiss M. Trayshawn Durkin Marks; 06/16/2015 5:11 PM) SYMPTOMATIC CHOLELITHIASIS (K80.20) Impression: I believe the patient's symptoms are consistent with gallbladder disease.  We discussed gallbladder disease. The patient was given Neurosurgeon. We discussed non-operative and operative management. We discussed the signs & symptoms of acute cholecystitis  Since he has had what sounds like ongoing episodes of biliary colic, I have recommended a cholecystectomy  I discussed laparoscopic cholecystectomy with IOC in detail. The patient was given educational material as well as diagrams detailing the procedure. We discussed the risks and benefits of a laparoscopic cholecystectomy including, but not limited to bleeding, infection, injury to surrounding structures such as the intestine or  liver, bile leak, retained gallstones, need to convert to an open procedure, prolonged diarrhea, blood clots such as DVT, common bile duct injury, anesthesia risks, and possible need for additional procedures. We discussed the typical post-operative recovery course. I explained that the likelihood of improvement of their symptoms is good.  We did discuss that he was at slightly higher risk for bleeding and hematoma and blood clot given his chronic medical conditions  The patient has elected to proceed with surgery. We will get cardiac clearance from his cardiologist as well as permission  from his PCP to hold his Coumadin Current Plans Pt Education - Pamphlet Given - Laparoscopic Gallbladder Surgery: discussed with patient and provided information. I recommended obtaining preoperative cardiac clearance. I am concerned about the health of the patient and the ability to tolerate the operation. Therefore, we will request clearance by cardiology to better assess operative risk & see if a reevaluation, further workup, etc is needed. Also recommendations on how medications such as for anticoagulation and blood pressure should be managed/held/restarted after surgery. ANTICOAGULATED ON COUMADIN (Z51.81) PACEMAKER (Z95.0) ATRIAL FIBRILLATION, CHRONIC (I48.2)  [name]. [name], Marks, FACS General, Bariatric, & Minimally Invasive Surgery [facility], [place]

## 2015-07-16 NOTE — Op Note (Signed)
[name] [id number] 1927-03-27 07/16/2015  Laparoscopic Cholecystectomy with IOC Procedure Note  Indications: This patient presents with symptomatic gallbladder disease and will undergo laparoscopic cholecystectomy. Please see H&P for addl details. We received clearance from PCP and cardiologist. He had stopped his coumadin and was on a lovenox bridge  Pre-operative Diagnosis: symptomatic cholelithiasis  Post-operative Diagnosis: chronic calculous cholecystitis  Surgeon: [name]   Assistants: Henry Hausen MD   Anesthesia: General endotracheal anesthesia  Procedure Details  The patient was seen again in the Holding Room. The risks, benefits, complications, treatment options, and expected outcomes were discussed with the patient. The possibilities of reaction to medication, pulmonary aspiration, perforation of viscus, bleeding, recurrent infection, finding a normal gallbladder, the need for additional procedures, failure to diagnose a condition, the possible need to convert to an open procedure, and creating a complication requiring transfusion or operation were discussed with the patient. The likelihood of improving the patient's symptoms with return to their baseline status is good.  The patient and/or family concurred with the proposed plan, giving informed consent. The site of surgery properly noted. The patient was taken to Operating Room, identified as [name] and the procedure verified as Laparoscopic Cholecystectomy with Intraoperative Cholangiogram. A Time Out was held and the above information confirmed. Antibiotic prophylaxis was administered.   Prior to the induction of general anesthesia, antibiotic prophylaxis was administered. General endotracheal anesthesia was then administered and tolerated well. After the induction, the abdomen was prepped with Chloraprep and draped in the sterile fashion. The patient was positioned in the supine position.  Local  anesthetic agent was injected into the skin near the umbilicus and an incision made. We dissected down to the abdominal fascia with blunt dissection.  The fascia was incised vertically and we entered the peritoneal cavity bluntly.  A pursestring suture of 0-Vicryl was placed around the fascial opening.  The Hasson cannula was inserted and secured with the stay suture.  Pneumoperitoneum was then created with CO2 and tolerated well without any adverse changes in the patient's vital signs. An 5-mm port was placed in the subxiphoid position.  Two 5-mm ports were placed in the right upper quadrant. All skin incisions were infiltrated with a local anesthetic agent before making the incision and placing the trocars.   We positioned the patient in reverse Trendelenburg, tilted slightly to the patient's left.  The gallbladder was identified, the fundus grasped and retracted cephalad. Adhesions were lysed bluntly and with the electrocautery where indicated, taking care not to injure any adjacent organs or viscus. The infundibulum was grasped and retracted laterally, exposing the peritoneum overlying the triangle of Calot. This was then divided and exposed in a blunt fashion. A critical view of the cystic duct and cystic artery was obtained.  The cystic duct was clearly identified and bluntly dissected circumferentially. The cystic duct was ligated with a clip distally.   An incision was made in the cystic duct and the [facility] cholangiogram catheter introduced. The catheter was secured using a clip. A cholangiogram was then obtained which showed good visualization of the distal and proximal biliary tree with no sign of filling defects or obstruction.  Contrast flowed easily into the duodenum. The catheter was then removed.   The cystic duct was then ligated with clips and divided. The cystic artery which had been identified & dissected free was ligated with clips and divided as well.   The gallbladder was dissected from the  liver bed in retrograde fashion with the electrocautery.  The gallbladder was removed and placed in an Ecco sac.  The gallbladder and Ecco sac were then removed through the umbilical port site. The liver bed was irrigated and inspected. Hemostasis was achieved with the electrocautery. Because he is on blood thinners, I did place Arista into the gallbladder fossa. Copious irrigation was utilized and was repeatedly aspirated until clear.  The pursestring suture was used to close the umbilical fascia.  There was an air leak so 2 additional interrupted 0 vicryl sutures were placed at the umbilical fascia using the endoclose device using laparoscopic guidance.   We again inspected the right upper quadrant for hemostasis.  The umbilical closure was inspected and there was no air leak and nothing trapped within the closure. Pneumoperitoneum was released as we removed the trocars.  4-0 Monocryl was used to close the skin.   Benzoin, steri-strips, and clean dressings were applied. The patient was then extubated and brought to the recovery room in stable condition. Instrument, sponge, and needle counts were correct at closure and at the conclusion of the case.   Findings: Chronic Cholecystitis with Cholelithiasis  Estimated Blood Loss: Minimal         Drains: none         Specimens: Gallbladder           Complications: None; patient tolerated the procedure well.         Disposition: PACU - hemodynamically stable.         Condition: stable  [name]. Henry Pulling, MD, FACS General, Bariatric, & Minimally Invasive Surgery [facility], [place]

## 2015-07-16 NOTE — Anesthesia Postprocedure Evaluation (Signed)
Anesthesia Post Note  Patient: [name]  Procedure(s) Performed: Procedure(s) (LRB): LAPAROSCOPIC CHOLECYSTECTOMY WITH INTRAOPERATIVE CHOLANGIOGRAM (N/A)  Patient location during evaluation: PACU Anesthesia Type: General Level of consciousness: sedated Pain management: satisfactory to patient Vital Signs Assessment: post-procedure vital signs reviewed and stable Respiratory status: spontaneous breathing Cardiovascular status: stable Anesthetic complications: no    Last Vitals:  Filed Vitals:   07/16/15 1312 07/16/15 1433  BP: 134/73 141/77  Pulse: 70 73  Temp: 36.5 C 36.6 C  Resp: 16 16    Last Pain:  Filed Vitals:   07/16/15 1435  PainSc: 2                  [name]

## 2015-07-16 NOTE — Transfer of Care (Signed)
Immediate Anesthesia Transfer of Care Note  Patient: [name]  Procedure(s) Performed: Procedure(s): LAPAROSCOPIC CHOLECYSTECTOMY WITH INTRAOPERATIVE CHOLANGIOGRAM (N/A)  Patient Location: PACU  Anesthesia Type:General  Level of Consciousness: awake, alert , oriented and patient cooperative  Airway & Oxygen Therapy: Patient Spontanous Breathing and Patient connected to face mask oxygen  Post-op Assessment: Report given to RN and Post -op Vital signs reviewed and stable  Post vital signs: Reviewed and stable  Last Vitals:  Filed Vitals:   07/16/15 0748  BP: 119/70  Pulse: 78  Temp: 36.7 C  Resp: 18    Last Pain:  Filed Vitals:   07/16/15 0817  PainSc: 0-No pain         Complications: No apparent anesthesia complications

## 2015-07-16 NOTE — Anesthesia Procedure Notes (Signed)
Procedure Name: Intubation Date/Time: 07/16/2015 9:46 AM Performed by: Dione Booze Pre-anesthesia Checklist: Patient identified, Emergency Drugs available, Suction available and Patient being monitored Patient Re-evaluated:Patient Re-evaluated prior to inductionOxygen Delivery Method: Circle system utilized Preoxygenation: Pre-oxygenation with 100% oxygen Intubation Type: IV induction Ventilation: Mask ventilation without difficulty Laryngoscope Size: Mac and 4 Grade View: Grade I Tube size: 7.5 mm Number of attempts: 1 Airway Equipment and Method: Stylet Placement Confirmation: ETT inserted through vocal cords under direct vision and positive ETCO2 Secured at: 22 cm Tube secured with: Tape Dental Injury: Teeth and Oropharynx as per pre-operative assessment

## 2015-07-16 NOTE — Anesthesia Preprocedure Evaluation (Signed)
Anesthesia Evaluation  Patient identified by MRN, date of birth, ID band Patient awake    Reviewed: Allergy & Precautions, H&P , NPO status , Patient's Chart, lab work & pertinent test results, reviewed documented beta blocker date and time   Airway Mallampati: I  TM Distance: >3 FB Neck ROM: Full    Dental no notable dental hx.    Pulmonary former smoker,    Pulmonary exam normal breath sounds clear to auscultation       Cardiovascular Normal cardiovascular exam+ dysrhythmias + pacemaker  Rhythm:regular Rate:Normal     Neuro/Psych    GI/Hepatic GERD  Medicated and Controlled,  Endo/Other    Renal/GU CRFRenal disease     Musculoskeletal   Abdominal   Peds  Hematology  (+) anemia ,   Anesthesia Other Findings Bursitis  OF THE RIGHT SHOULDER Tinnitus     atrial fibrillation (HCC)   GERD (gastroesophageal reflux disease)    MDS (myelodysplastic syndrome), low grade; current platelet count 91( 07-16-15)    Iron overload, transfusional    Complete heart block s/p AV nodal ablation and BIV pacemaker implant at [facility] by Dr Rosita Fire   CHF  Chronic kidney disease    Reproductive/Obstetrics                             Anesthesia Physical  Anesthesia Plan  ASA: III  Anesthesia Plan: General   Post-op Pain Management:    Induction: Intravenous  Airway Management Planned: Oral ETT  Additional Equipment:   Intra-op Plan:   Post-operative Plan: Extubation in OR  Informed Consent: I have reviewed the patients History and Physical, chart, labs and discussed the procedure including the risks, benefits and alternatives for the proposed anesthesia with the patient or authorized representative who has indicated his/her understanding and acceptance.   Dental Advisory Given  Plan Discussed with: CRNA and Surgeon  Anesthesia Plan Comments:         Anesthesia Quick  Evaluation                                  Anesthesia Evaluation  Patient identified by MRN, date of birth, ID band Patient awake    Reviewed: Allergy & Precautions, NPO status , Patient's Chart, lab work & pertinent test results  Airway Mallampati: I  TM Distance: >3 FB Neck ROM: Full    Dental   Pulmonary former smoker,    Pulmonary exam normal        Cardiovascular Normal cardiovascular exam+ dysrhythmias + pacemaker      Neuro/Psych    GI/Hepatic GERD  Medicated and Controlled,  Endo/Other    Renal/GU CRFRenal disease     Musculoskeletal   Abdominal   Peds  Hematology  (+) anemia ,   Anesthesia Other Findings   Reproductive/Obstetrics                             Anesthesia Physical Anesthesia Plan  ASA: III  Anesthesia Plan: General   Post-op Pain Management:    Induction: Intravenous  Airway Management Planned: Oral ETT  Additional Equipment:   Intra-op Plan:   Post-operative Plan: Extubation in OR  Informed Consent: I have reviewed the patients History and Physical, chart, labs and discussed the procedure including the risks, benefits and alternatives for the proposed anesthesia with the  patient or authorized representative who has indicated his/her understanding and acceptance.     Plan Discussed with: CRNA and Surgeon  Anesthesia Plan Comments:         Anesthesia Quick Evaluation

## 2015-07-17 DIAGNOSIS — K8010 Calculus of gallbladder with chronic cholecystitis without obstruction: Secondary | ICD-10-CM | POA: Diagnosis not present

## 2015-07-17 LAB — CBC
HCT: 30 % — ABNORMAL LOW (ref 39.0–52.0)
HEMATOCRIT: 30.5 % — AB (ref 39.0–52.0)
HEMOGLOBIN: 10 g/dL — AB (ref 13.0–17.0)
HEMOGLOBIN: 9.8 g/dL — AB (ref 13.0–17.0)
MCH: 30.2 pg (ref 26.0–34.0)
MCH: 30.8 pg (ref 26.0–34.0)
MCHC: 32.8 g/dL (ref 30.0–36.0)
MCHC: 32.7 g/dL (ref 30.0–36.0)
MCV: 92.1 fL (ref 78.0–100.0)
MCV: 94.3 fL (ref 78.0–100.0)
PLATELETS: 73 10*3/uL — AB (ref 150–400)
Platelets: 70 10*3/uL — ABNORMAL LOW (ref 150–400)
RBC: 3.31 MIL/uL — ABNORMAL LOW (ref 4.22–5.81)
RBC: 3.18 MIL/uL — AB (ref 4.22–5.81)
RDW: 14.9 % (ref 11.5–15.5)
RDW: 15.1 % (ref 11.5–15.5)
WBC: 25.1 10*3/uL — ABNORMAL HIGH (ref 4.0–10.5)
WBC: 24.5 10*3/uL — ABNORMAL HIGH (ref 4.0–10.5)

## 2015-07-17 LAB — BASIC METABOLIC PANEL
ANION GAP: 7 (ref 5–15)
BUN: 24 mg/dL — AB (ref 6–20)
CHLORIDE: 107 mmol/L (ref 101–111)
CO2: 25 mmol/L (ref 22–32)
Calcium: 8.4 mg/dL — ABNORMAL LOW (ref 8.9–10.3)
Creatinine, Ser: 1.91 mg/dL — ABNORMAL HIGH (ref 0.61–1.24)
GFR calc Af Amer: 35 mL/min — ABNORMAL LOW (ref >60)
GFR, EST NON AFRICAN AMERICAN: 30 mL/min — AB (ref >60)
GLUCOSE: 114 mg/dL — AB (ref 65–99)
POTASSIUM: 4.3 mmol/L (ref 3.5–5.1)
Sodium: 139 mmol/L (ref 135–145)

## 2015-07-17 MED ORDER — OXYCODONE HCL 5 MG PO TABS: 2.5000 mg | ORAL_TABLET | ORAL | Status: DC | PRN

## 2015-07-17 NOTE — Care Management Obs Status (Signed)
[place] NOTIFICATION   Patient Details  Name: [name] MRN: [id number] Date of Birth: 08-10-1927   Medicare Observation Status Notification Given:  Yes MOON GIVEN AND SIGNED BY PT; COPY TO PT AND ORIGINAL GIVEN TO [name] TO BE SCANNED INTO MEDICAL RECORD. Dellie Catholic, RN 07/17/2015, 12:44 PM

## 2015-07-17 NOTE — Progress Notes (Signed)
Patient alert and oriented with pain controlled. Spouse at bedside. Patient given discharge instructions and prescriptions. All questions and concerns answered. Patient verbalized understanding of discharge instructions.

## 2015-07-17 NOTE — Discharge Summary (Signed)
Physician Discharge Summary  [name] [id number] DOB: 08-Mar-1927 DOA: 07/16/2015  PCP: Penni Homans, MD  Admit date: 07/16/2015 Discharge date: 07/17/2015  Recommendations for Outpatient Follow-up:   Follow-up Information    Follow up with [name], MD On 08/07/2015.   Specialty:  General Surgery   Why:  8:45 AM (PLEASE ARRIVE AT 0830)   Contact information:   [address] [place] [address] [phone]       Follow up with Penni Homans, MD. Schedule an appointment as soon as possible for a visit in 5 days.   Specialty:  Family Medicine   Why:  for INR check   Contact information:   [place] [address] [phone]      Discharge Diagnoses:  1. Chronic cholecystitis with calculous 2. CKD 3. Chronic systolic heart failure 4. A fib 5. Chronic anticoagulation  Surgical Procedure: laparoscopic cholecystectomy with ioc  Discharge Condition: good Disposition: home  Diet recommendation: cardiac  Filed Weights   07/16/15 1218  Weight: 92.08 kg (203 lb)    Hospital Course:  He was brought in for planned laparoscopic cholecystectomy.  His Coumadin had been held and he was placed on a Lovenox bridge.  Please see operative note for further details.  He was kept overnight for observation given his comorbidities.  On postoperative day 1 he was doing well.  He was tolerating a diet.  He had had some nausea on the evening of postoperative day 0.  His blood pressure was a little bit low however he appeared asymptomatic.  He was ambulating without lightheadedness or dizziness. A repeat blood count was performed and his hemoglobin was stable. He was deemed stable for discharge.  We have discussed discharge instructions.  He received 1 Lovenox injections the morning on postoperative day 1.  I asked him to resume his Lovenox and Coumadin on Thursday.  We also asked for him to have his Coumadin level checked early next  week.  BP 95/46 mmHg  Pulse 70  Temp(Src) 98 F (36.7 C) (Oral)  Resp 16  Ht 6\' 2"  (1.88 m)  Wt 92.08 kg (203 lb)  BMI 26.05 kg/m2  SpO2 95%  Gen: alert, NAD, non-toxic appearing Pupils: equal, no scleral icterus Pulm: Lungs clear to auscultation, symmetric chest rise CV: regular Abd: soft, nontender, nondistended. Some bruising from injection site.  No cellulitis. No incisional hernia Ext: no edema, no calf tenderness Skin: no rash, no jaundice    Discharge Instructions  Discharge Instructions    Call MD for:  hives    Complete by:  As directed      Call MD for:  persistant dizziness or light-headedness    Complete by:  As directed      Call MD for:  persistant nausea and vomiting    Complete by:  As directed      Call MD for:  redness, tenderness, or signs of infection (pain, swelling, redness, odor or green/yellow discharge around incision site)    Complete by:  As directed      Call MD for:  severe uncontrolled pain    Complete by:  As directed      Call MD for:    Complete by:  As directed   Temperature >101     Diet - low sodium heart healthy    Complete by:  As directed      Discharge instructions    Complete by:  As directed  See [facility] discharge instructions RESUME COUMADIN AND LOVENOX INJECTIONS ON Thursday 6/29 FOLLOW UP WITH DR BLYTH'S OFFICE EARLY NEXT WEEK FOR INR/COUMADIN LEVEL CHECK     Increase activity slowly    Complete by:  As directed             Medication List    TAKE these medications        allopurinol 300 MG tablet  Commonly known as:  ZYLOPRIM  Take 1 tablet (300 mg total) by mouth at bedtime.     carvedilol 6.25 MG tablet  Commonly known as:  COREG  Take 1 tablet (6.25 mg total) by mouth 2 (two) times daily with a meal.     doxazosin 4 MG tablet  Commonly known as:  CARDURA  Take 4 mg by mouth at bedtime.     enoxaparin 40 MG/0.4ML injection  Commonly known as:  LOVENOX  Inject 0.4 mLs (40 mg total) into the skin every 12  (twelve) hours.     famotidine 40 MG tablet  Commonly known as:  PEPCID  Take 80 mg by mouth daily.     finasteride 5 MG tablet  Commonly known as:  PROSCAR  Take 5 mg by mouth every evening.     furosemide 40 MG tablet  Commonly known as:  LASIX  Take 1 tablet (40 mg total) by mouth daily.     multivitamin with minerals Tabs tablet  Take 1 tablet by mouth daily.     oxyCODONE 5 MG immediate release tablet  Commonly known as:  Oxy IR/ROXICODONE  Take 0.5-1 tablets (2.5-5 mg total) by mouth every 4 (four) hours as needed for moderate pain.     pyridoxine 100 MG tablet  Commonly known as:  B-6  Take 200 mg by mouth daily.     warfarin 5 MG tablet  Commonly known as:  COUMADIN  Take 2.5-5 mg by mouth daily. Take 1 tablet (5mg ) all days except on Mon, Wed, Fri take 1/2 tablet (2.5mg )     warfarin 5 MG tablet  Commonly known as:  COUMADIN  TAKE ONE TABLET BY MOUTH ONCE DAILY AT  SUPPER  EXCEPT  ON  SUNDAY           Follow-up Information    Follow up with [name], MD On 08/07/2015.   Specialty:  General Surgery   Why:  8:45 AM (PLEASE ARRIVE AT 0830)   Contact information:   [address] [place] [address] [phone]       Follow up with Penni Homans, MD. Schedule an appointment as soon as possible for a visit in 5 days.   Specialty:  Family Medicine   Why:  for INR check   Contact information:   [place] [address] [phone]        The results of significant diagnostics from this hospitalization (including imaging, microbiology, ancillary and laboratory) are listed below for reference.    Significant Diagnostic Studies: Dg Cholangiogram Operative  07/16/2015  CLINICAL DATA:  History of chronic cholecystitis. EXAM: INTRAOPERATIVE CHOLANGIOGRAM TECHNIQUE: Cholangiographic images from the C-arm fluoroscopic device were submitted for interpretation post-operatively. Please see the procedural report for the  amount of contrast and the fluoroscopy time utilized. COMPARISON:  ERCP 11/27/2014 FINDINGS: Common bile duct is patent and contrast drains into the duodenum. There is reflux into the common hepatic duct and central intrahepatic bile ducts. No large filling defects or stones. IMPRESSION: Patent biliary  system.  No large filling defects or stones. Electronically Signed   By: Markus Daft M.D.   On: 07/16/2015 10:57    Microbiology: No results found for this or any previous visit (from the past 240 hour(s)).   Labs: Basic Metabolic Panel:  Recent Labs Lab 07/17/15 0441  NA 139  K 4.3  CL 107  CO2 25  GLUCOSE 114*  BUN 24*  CREATININE 1.91*  CALCIUM 8.4*   Liver Function Tests: No results for input(s): AST, ALT, ALKPHOS, BILITOT, PROT, ALBUMIN in the last 168 hours. No results for input(s): LIPASE, AMYLASE in the last 168 hours. No results for input(s): AMMONIA in the last 168 hours. CBC:  Recent Labs Lab 07/17/15 0441 07/17/15 1024  WBC 25.1* 24.5*  HGB 10.0* 9.8*  HCT 30.5* 30.0*  MCV 92.1 94.3  PLT 70* 73*   Cardiac Enzymes: No results for input(s): CKTOTAL, CKMB, CKMBINDEX, TROPONINI in the last 168 hours. BNP: BNP (last 3 results) No results for input(s): BNP in the last 8760 hours.  ProBNP (last 3 results) No results for input(s): PROBNP in the last 8760 hours.  CBG: No results for input(s): GLUCAP in the last 168 hours.  Active Problems:   S/P laparoscopic cholecystectomy   Time coordinating discharge: 20 minutes  Signed:  [name], MD [facility], [place] [phone] 07/17/2015, 12:54 PM

## 2015-07-17 NOTE — Discharge Instructions (Signed)
RESUME COUMADIN AND LOVENOX INJECTIONS ON Thursday 6/29  [facility] LAPAROSCOPIC SURGERY: POST OP INSTRUCTIONS Always review your discharge instruction sheet given to you by the facility where your surgery was performed. IF YOU HAVE DISABILITY OR FAMILY LEAVE FORMS, YOU MUST BRING THEM TO THE OFFICE FOR PROCESSING.   DO NOT GIVE THEM TO YOUR DOCTOR.  1. A prescription for pain medication may be given to you upon discharge.  Take your pain medication as prescribed, if needed.  If narcotic pain medicine is not needed, then you may take acetaminophen (Tylenol) or ibuprofen (Advil) as needed. 2. Take your usually prescribed medications unless otherwise directed. 3. If you need a refill on your pain medication, please contact your pharmacy.  They will contact our office to request authorization. Prescriptions will not be filled after 5pm or on week-ends. 4. You should follow a light diet the first few days after arrival home, such as soup and crackers, etc.  Be sure to include lots of fluids daily. 5. Most patients will experience some swelling and bruising in the area of the incisions.  Ice packs will help.  Swelling and bruising can take several days to resolve.  6. It is common to experience some constipation if taking pain medication after surgery.  Increasing fluid intake and taking a stool softener (such as Colace) will usually help or prevent this problem from occurring.  A mild laxative (Milk of Magnesia or Miralax) should be taken according to package instructions if there are no bowel movements after 48 hours. 7. Unless discharge instructions indicate otherwise, you may remove your bandages 24-48 hours after surgery, and you may shower at that time.  You may have steri-strips (small skin tapes) in place directly over the incision.  These strips should be left on the skin for 7-10 days.  If your surgeon used skin glue on the incision, you may shower in 24 hours.  The glue  will flake off over the next 2-3 weeks.  Any sutures or staples will be removed at the office during your follow-up visit. 8. ACTIVITIES:  You may resume regular (light) daily activities beginning the next day--such as daily self-care, walking, climbing stairs--gradually increasing activities as tolerated.  You may have sexual intercourse when it is comfortable.  Refrain from any heavy lifting or straining until approved by your doctor. a. You may drive when you are no longer taking prescription pain medication, you can comfortably wear a seatbelt, and you can safely maneuver your car and apply brakes. 9. You should see your doctor in the office for a follow-up appointment approximately 2-3 weeks after your surgery.  Make sure that you call for this appointment within a day or two after you arrive home to insure a convenient appointment time. 10. OTHER INSTRUCTIONS:  WHEN TO CALL YOUR DOCTOR: 1. Fever over 101.0 2. Inability to urinate 3. Continued bleeding from incision. 4. Increased pain, redness, or drainage from the incision. 5. Increasing abdominal pain  The clinic staff is available to answer your questions during regular business hours.  Please dont hesitate to call and ask to speak to one of the nurses for clinical concerns.  If you have a medical emergency, go to the nearest emergency room or call 911.  A surgeon from [facility] is always on call at the hospital. [address], [place], [place], [place]  [address] ? [address], [place], [place]   [phone] ? [phone] ? FAX [phone]  Web site: [email or web address]

## 2015-07-18 ENCOUNTER — Telehealth: Payer: Self-pay | Admitting: Family Medicine

## 2015-07-18 NOTE — Telephone Encounter (Signed)
°  Relationship to patient: Self Can be reached:[phone]   Reason for call: Patient wants call back to discuss his Levonox injections. States he needs to know how long to take them following his Hospital stay

## 2015-07-18 NOTE — Telephone Encounter (Signed)
Patient informed of PCP instructions. Scheduled nurse visit appt. 07/19/15 at 10:15 to check PT/INR.

## 2015-07-18 NOTE — Telephone Encounter (Signed)
Please reach out and help him get INR check on 6/30. Stay on Lovenox til INR is therapeutic

## 2015-07-19 ENCOUNTER — Ambulatory Visit (INDEPENDENT_AMBULATORY_CARE_PROVIDER_SITE_OTHER): Payer: Medicare Other | Admitting: *Deleted

## 2015-07-19 DIAGNOSIS — I482 Chronic atrial fibrillation: Secondary | ICD-10-CM

## 2015-07-19 DIAGNOSIS — I4821 Permanent atrial fibrillation: Secondary | ICD-10-CM

## 2015-07-19 LAB — POCT INR: INR: 1.2

## 2015-07-19 MED ORDER — ENOXAPARIN SODIUM 40 MG/0.4ML ~~LOC~~ SOLN: 40.0000 mg | Freq: Two times a day (BID) | SUBCUTANEOUS | Status: DC

## 2015-07-19 NOTE — Progress Notes (Signed)
Pre visit review using our clinic review tool, if applicable. No additional management support is needed unless otherwise documented below in the visit note.  INR today 1.2. Pt had lap cholecystectomy 07/16/15. Has been bridging w/ Lovenox 40 mg BID, restarted coumadin 5 mg last night.  Per Dr. Charlett Blake: Continue Lovenox as directed. Take coumadin 5 mg daily today, Saturday, and Sunday. Return for INR check on Monday 07/22/15.  Called pt, as he preferred not to wait in office, and notified him of instructions. He verbalized understanding. Additional lovenox syringes filled to pharmacy.   Next appointment: 07/22/15  Dorrene German, RN

## 2015-07-19 NOTE — Progress Notes (Signed)
RN note reviewed. Agree with documention and plan. 

## 2015-07-22 ENCOUNTER — Ambulatory Visit (INDEPENDENT_AMBULATORY_CARE_PROVIDER_SITE_OTHER): Payer: Medicare Other | Admitting: Behavioral Health

## 2015-07-22 DIAGNOSIS — I4821 Permanent atrial fibrillation: Secondary | ICD-10-CM

## 2015-07-22 DIAGNOSIS — I482 Chronic atrial fibrillation: Secondary | ICD-10-CM

## 2015-07-22 LAB — POCT INR: INR: 1.5

## 2015-07-22 NOTE — Progress Notes (Signed)
Pre visit review using our clinic review tool, if applicable. No additional management support is needed unless otherwise documented below in the visit note.  Patient presents in clinic for INR check. Reviewed medications with the patient. Today's reading was 1.5. Currently the patient is taking Coumadin 5 mg daily after restarting medication last Thursday evening; he recently had a laparoscopic cholecystectomy and has been on a Lovenox bridge. Patient reports that he has four more doses to complete, but does not desire to finish those that are left to take. He stated, "last night would have been my last injection because I'm not going to take anymore of the medication." PCP made aware.   Per Dr. Charlett Blake, she is okay with the patient's decision to not take the remaining amounts of Lovenox and advised that he continues taking Coumadin 5 mg daily and return for INR check on Friday 07/26/15. Informed patient of the provider's recommendations. Also, reiterated to the patient that with a lower INR, the blood is thicker and puts you at risk for blood clots, so it's important to be aware of the signs/symptoms, such as sudden, severe headache, vision changes, confusion, shortness of breath, chest pain, swelling, redness, tenderness and/or warmth in one leg or a specific area of the leg and to call the office if he notices any symptoms. He verbalized understanding and did not have any further concerns.  Next appointment scheduled for 07/26/15 at 9:30 AM.

## 2015-07-22 NOTE — Patient Instructions (Signed)
Per Dr. Charlett Blake: Continue taking Coumadin 5 mg daily and return for INR check on Friday 07/26/15.

## 2015-07-22 NOTE — Progress Notes (Signed)
RN note reviewed. Agree with documention and plan. 

## 2015-07-26 ENCOUNTER — Ambulatory Visit (INDEPENDENT_AMBULATORY_CARE_PROVIDER_SITE_OTHER): Payer: Medicare Other | Admitting: *Deleted

## 2015-07-26 DIAGNOSIS — I482 Chronic atrial fibrillation: Secondary | ICD-10-CM | POA: Diagnosis not present

## 2015-07-26 DIAGNOSIS — I4821 Permanent atrial fibrillation: Secondary | ICD-10-CM

## 2015-07-26 LAB — POCT INR: INR: 2.1

## 2015-07-26 NOTE — Progress Notes (Signed)
Pre visit review using our clinic review tool, if applicable. No additional management support is needed unless otherwise documented below in the visit note.  INR today 2.1. Pt findings negative.  Per Dr. Charlett Blake: Take coumadin 5 mg daily, except take 2.5 mg on Saturdays. Return for INR check towards the middle of next week.  Called pt w/ results and he preferred not to wait in office. Pt's wife notified of instructions and verbalized understanding.   Next appointment: 08/01/15  Dorrene German, RN

## 2015-07-27 NOTE — Progress Notes (Signed)
RN note reviewed. Agree with documention and plan. 

## 2015-07-29 ENCOUNTER — Encounter: Payer: Medicare Other | Admitting: Internal Medicine

## 2015-08-01 ENCOUNTER — Ambulatory Visit (INDEPENDENT_AMBULATORY_CARE_PROVIDER_SITE_OTHER): Payer: Medicare Other | Admitting: *Deleted

## 2015-08-01 DIAGNOSIS — I482 Chronic atrial fibrillation: Secondary | ICD-10-CM

## 2015-08-01 DIAGNOSIS — I4821 Permanent atrial fibrillation: Secondary | ICD-10-CM

## 2015-08-01 LAB — POCT INR: INR: 2.3

## 2015-08-01 NOTE — Patient Instructions (Signed)
Per Dr. Charlett Blake: Continue taking coumadin 5 mg daily, except take 2.5 mg on Saturdays. Return for INR check in 4 weeks.

## 2015-08-01 NOTE — Progress Notes (Signed)
RN note reviewed. Agree with documention and plan. 

## 2015-08-01 NOTE — Progress Notes (Signed)
Pre visit review using our clinic review tool, if applicable. No additional management support is needed unless otherwise documented below in the visit note.  INR today 2.3. Pt findings negative.   Per Dr. Charlett Blake: Continue taking coumadin 5 mg daily, except take 2.5 mg on Saturdays. Return for INR check in 4 weeks.  Called pt and left message w/ detailed instructions per pt request (he prefers not to wait in clinic). AVS mailed to pt.   Dorrene German, RN

## 2015-08-08 ENCOUNTER — Ambulatory Visit (INDEPENDENT_AMBULATORY_CARE_PROVIDER_SITE_OTHER): Payer: Medicare Other

## 2015-08-08 DIAGNOSIS — I5022 Chronic systolic (congestive) heart failure: Secondary | ICD-10-CM

## 2015-08-08 DIAGNOSIS — Z95810 Presence of automatic (implantable) cardiac defibrillator: Secondary | ICD-10-CM | POA: Diagnosis not present

## 2015-08-08 NOTE — Progress Notes (Addendum)
EPIC Encounter for ICM Monitoring  Patient Name: [name] is a 81 y.o. male Date: 08/08/2015 Primary Care Physican: Penni Homans, MD Primary Cardiologist: Mare Ferrari Electrophysiologist: Allred Dry Weight: 200 lb  Bi-V Pacing:  95.6%       Heart Failure questions reviewed, pt asymptomatic.  Patient had gallbladder surgery in June.  Thoracic impedence abnormal since 07/28/2015 suggesting fluid accumulation.  LABS:  Creatinine has ranged from 1.41 to 2.0 since 2012 07/17/2015 Creatinine 1.91,   BUN 24,      Potassium 4.3,  Sodium 139 07/10/2015 Creatinine 1.8,   BUN 23.6, Potassium 4.0, Sodium 143 03/12/2015 Creatinine 1.84, BUN 30,    Potassium 4.1, Sodium 138 11/21/2014 Creatinine 1.50, BUN 19,    Potassium 4.2, Sodium 139 11/19/2014 Creatinine 1.63, BUN 21,    Potassium 3.8, Sodium 141  Recommendations:  Reviewed with DOD, Dr Tamala Julian and advised no changes since patient is asymptomatic.  He advised patient limit sodium intake.   Repeat transmission 08/16/2015.   Call back to patient and advice of Dr Thompson Caul recommendation to limit salt and fluid intake. Advised to call if he experiences any fluid symptoms to call back.  No changes in medications today.    ICM trend: 08/08/2015    Follow-up plan: ICM clinic phone appointment on 08/16/2015.  Copy of ICM check sent to device physician.   Rosalene Billings, RN 08/08/2015 9:12 AM

## 2015-08-14 ENCOUNTER — Telehealth: Payer: Self-pay

## 2015-08-14 NOTE — Telephone Encounter (Signed)
Request from [employer] received. Forwarded to Dr Charlett Blake for completion.

## 2015-08-15 ENCOUNTER — Telehealth: Payer: Self-pay | Admitting: Cardiology

## 2015-08-15 ENCOUNTER — Ambulatory Visit (INDEPENDENT_AMBULATORY_CARE_PROVIDER_SITE_OTHER): Payer: Medicare Other

## 2015-08-15 DIAGNOSIS — Z95810 Presence of automatic (implantable) cardiac defibrillator: Secondary | ICD-10-CM

## 2015-08-15 DIAGNOSIS — I5022 Chronic systolic (congestive) heart failure: Secondary | ICD-10-CM

## 2015-08-15 NOTE — Telephone Encounter (Signed)
LMOVM reminding pt to send remote transmission.   

## 2015-08-16 NOTE — Progress Notes (Signed)
Henry Grayer, MD  Rosalene Billings, RN        Ok to use protocol.    Call to patient.  Furosemide 40 mg bid x 3 days and then return to normal dosage after 3rd day.  Repeat transmission on 08/21/2015.

## 2015-08-16 NOTE — Progress Notes (Signed)
EPIC Encounter for ICM Monitoring  Patient Name: [name] is a 82 y.o. male Date: 08/16/2015 Primary Care Physican: Penni Homans, MD Primary Cardiologist: Mare Ferrari Electrophysiologist: Allred Dry Weight: 200 lb  Bi-V Pacing:  94.3%       Heart Failure questions reviewed, pt asymptomatic   Thoracic impedance abnormal since 07/28/2015 suggesting fluid accumulation.  Patient reported feeling fine.     LABS:  Creatinine has ranged from 1.41 to 2.0 since 2012 07/17/2015 Creatinine 1.91, BUN 24,    Potassium 4.3,  Sodium 139 07/10/2015 Creatinine 1.8,   BUN 23.6, Potassium 4.0, Sodium 143 03/12/2015 Creatinine 1.84, BUN 30,    Potassium 4.1, Sodium 138 11/21/2014 Creatinine 1.50, BUN 19,    Potassium 4.2, Sodium 139 11/19/2014 Creatinine 1.63, BUN 21,    Potassium 3.8, Sodium 141   Recommendations: Will send copy of note to Dr Rayann Heman for review will notify patient if any recommendations.  ICM trend: 08/15/2015    Follow-up plan: ICM clinic phone appointment on 08/21/2015.  Copy of ICM check sent to device physician.   Rosalene Billings, RN 08/16/2015 9:23 AM

## 2015-08-20 ENCOUNTER — Other Ambulatory Visit: Payer: Self-pay | Admitting: Family Medicine

## 2015-08-21 ENCOUNTER — Ambulatory Visit (INDEPENDENT_AMBULATORY_CARE_PROVIDER_SITE_OTHER): Payer: Medicare Other

## 2015-08-21 DIAGNOSIS — Z95810 Presence of automatic (implantable) cardiac defibrillator: Secondary | ICD-10-CM

## 2015-08-21 DIAGNOSIS — I5022 Chronic systolic (congestive) heart failure: Secondary | ICD-10-CM

## 2015-08-21 NOTE — Progress Notes (Signed)
EPIC Encounter for ICM Monitoring  Patient Name: [name] is a 82 y.o. male Date: 08/21/2015 Primary Care Physican: Penni Homans, MD Primary Cardiologist: Allred Electrophysiologist: Allred Dry Weight: unknown Bi-V Pacing:  98%       Heart Failure questions reviewed, pt asymptomatic   Follow up after 3 days of increased Furosemide, thoracic impedance returned to normal.  LABS: Creatinine has ranged from 1.41 to 2.0 since 2012 07/17/2015 Creatinine 1.91, BUN 24,   Potassium 4.3, Sodium 139 07/10/2015 Creatinine 1.8, BUN 23.6, Potassium 4.0, Sodium 143 03/12/2015 Creatinine 1.84, BUN 30, Potassium 4.1, Sodium 138 11/21/2014 Creatinine 1.50, BUN 19, Potassium 4.2, Sodium 139 11/19/2014 Creatinine 1.63, BUN 21, Potassium 3.8, Sodium 141  Recommendations: No changes.  Low sodium diet education provided   ICM trend: 08/21/2015     Follow-up plan: ICM clinic phone appointment on 09/26/2015.  Copy of ICM check sent to device physician.  Rosalene Billings, RN 08/21/2015 9:48 AM

## 2015-09-02 ENCOUNTER — Ambulatory Visit (INDEPENDENT_AMBULATORY_CARE_PROVIDER_SITE_OTHER): Payer: Medicare Other | Admitting: Behavioral Health

## 2015-09-02 ENCOUNTER — Other Ambulatory Visit: Payer: Medicare Other

## 2015-09-02 DIAGNOSIS — I482 Chronic atrial fibrillation: Secondary | ICD-10-CM | POA: Diagnosis not present

## 2015-09-02 DIAGNOSIS — I4821 Permanent atrial fibrillation: Secondary | ICD-10-CM

## 2015-09-02 LAB — POCT INR: INR: 2.6

## 2015-09-02 NOTE — Progress Notes (Signed)
Pre visit review using our clinic review tool, if applicable. No additional management support is needed unless otherwise documented below in the visit note.  Patient in office for INR check. Today's reading was 2.6. Reviewed medications with the patient. He did not report any changes with medications, diet or abnormal bleeding.  Informed patient to continue taking coumadin 5 mg daily, except take 2.5 mg on Saturdays. Return for INR check in 4 weeks. He verbalized understanding and did not have any concerns before leaving the nurse visit.  Next appointment scheduled for 10/01/15 at 9:15 AM.

## 2015-09-02 NOTE — Patient Instructions (Signed)
Continue taking coumadin 5 mg daily, except take 2.5 mg on Saturdays. Return for INR check in 4 weeks.

## 2015-09-09 ENCOUNTER — Other Ambulatory Visit (HOSPITAL_BASED_OUTPATIENT_CLINIC_OR_DEPARTMENT_OTHER): Payer: Medicare Other

## 2015-09-09 ENCOUNTER — Encounter: Payer: Self-pay | Admitting: Hematology & Oncology

## 2015-09-09 ENCOUNTER — Encounter: Payer: Self-pay | Admitting: Family Medicine

## 2015-09-09 ENCOUNTER — Ambulatory Visit (INDEPENDENT_AMBULATORY_CARE_PROVIDER_SITE_OTHER): Payer: Medicare Other | Admitting: Family Medicine

## 2015-09-09 ENCOUNTER — Ambulatory Visit (HOSPITAL_BASED_OUTPATIENT_CLINIC_OR_DEPARTMENT_OTHER): Payer: Medicare Other | Admitting: Hematology & Oncology

## 2015-09-09 VITALS — BP 104/58 | HR 81 | Temp 98.1°F | Ht 74.0 in | Wt 207.4 lb

## 2015-09-09 VITALS — BP 114/56 | HR 80 | Temp 97.3°F | Resp 16 | Ht 74.0 in | Wt 206.0 lb

## 2015-09-09 DIAGNOSIS — I482 Chronic atrial fibrillation: Secondary | ICD-10-CM | POA: Diagnosis not present

## 2015-09-09 DIAGNOSIS — G454 Transient global amnesia: Secondary | ICD-10-CM

## 2015-09-09 DIAGNOSIS — E785 Hyperlipidemia, unspecified: Secondary | ICD-10-CM | POA: Diagnosis not present

## 2015-09-09 DIAGNOSIS — D508 Other iron deficiency anemias: Secondary | ICD-10-CM

## 2015-09-09 DIAGNOSIS — N183 Chronic kidney disease, stage 3 unspecified: Secondary | ICD-10-CM

## 2015-09-09 DIAGNOSIS — I5022 Chronic systolic (congestive) heart failure: Secondary | ICD-10-CM

## 2015-09-09 DIAGNOSIS — D4620 Refractory anemia with excess of blasts, unspecified: Secondary | ICD-10-CM

## 2015-09-09 DIAGNOSIS — D46Z Other myelodysplastic syndromes: Secondary | ICD-10-CM

## 2015-09-09 DIAGNOSIS — R945 Abnormal results of liver function studies: Secondary | ICD-10-CM

## 2015-09-09 DIAGNOSIS — D464 Refractory anemia, unspecified: Secondary | ICD-10-CM | POA: Diagnosis not present

## 2015-09-09 DIAGNOSIS — M109 Gout, unspecified: Secondary | ICD-10-CM

## 2015-09-09 DIAGNOSIS — I4821 Permanent atrial fibrillation: Secondary | ICD-10-CM

## 2015-09-09 DIAGNOSIS — R7989 Other specified abnormal findings of blood chemistry: Secondary | ICD-10-CM

## 2015-09-09 LAB — COMPREHENSIVE METABOLIC PANEL
ALBUMIN: 4.1 g/dL (ref 3.5–5.2)
ALT: 19 U/L (ref 0–53)
AST: 21 U/L (ref 0–37)
Alkaline Phosphatase: 72 U/L (ref 39–117)
BILIRUBIN TOTAL: 1.3 mg/dL — AB (ref 0.2–1.2)
BUN: 19 mg/dL (ref 6–23)
CALCIUM: 9 mg/dL (ref 8.4–10.5)
CO2: 28 meq/L (ref 19–32)
CREATININE: 1.58 mg/dL — AB (ref 0.40–1.50)
Chloride: 105 mEq/L (ref 96–112)
GFR: 44.39 mL/min — ABNORMAL LOW (ref >60.00)
Glucose, Bld: 94 mg/dL (ref 70–99)
Potassium: 4 mEq/L (ref 3.5–5.1)
SODIUM: 139 meq/L (ref 135–145)
Total Protein: 7.1 g/dL (ref 6.0–8.3)

## 2015-09-09 LAB — IRON AND TIBC
%SAT: 58 % — AB (ref 20–55)
Iron: 118 ug/dL (ref 42–163)
TIBC: 204 ug/dL (ref 202–409)
UIBC: 86 ug/dL — ABNORMAL LOW (ref 117–376)

## 2015-09-09 LAB — CMP (CANCER CENTER ONLY)
ALBUMIN: 3.9 g/dL (ref 3.3–5.5)
ALT: 25 U/L (ref 10–47)
AST: 27 U/L (ref 11–38)
Alkaline Phosphatase: 77 U/L (ref 26–84)
BILIRUBIN TOTAL: 1.7 mg/dL — AB (ref 0.20–1.60)
BUN: 18 mg/dL (ref 7–22)
CO2: 29 mEq/L (ref 18–33)
CREATININE: 1.7 mg/dL — AB (ref 0.6–1.2)
Calcium: 8.9 mg/dL (ref 8.0–10.3)
Chloride: 104 mEq/L (ref 98–108)
Glucose, Bld: 94 mg/dL (ref 73–118)
Potassium: 4.2 mEq/L (ref 3.3–4.7)
SODIUM: 137 meq/L (ref 128–145)
TOTAL PROTEIN: 7.3 g/dL (ref 6.4–8.1)

## 2015-09-09 LAB — CBC WITH DIFFERENTIAL (CANCER CENTER ONLY)
HCT: 33.2 % — ABNORMAL LOW (ref 38.7–49.9)
HEMOGLOBIN: 10.9 g/dL — AB (ref 13.0–17.1)
MCH: 30.9 pg (ref 28.0–33.4)
MCHC: 32.8 g/dL (ref 32.0–35.9)
MCV: 94 fL (ref 82–98)
Platelets: 99 10*3/uL — ABNORMAL LOW (ref 145–400)
RBC: 3.53 10*6/uL — AB (ref 4.20–5.70)
RDW: 14.9 % (ref 11.1–15.7)
WBC: 6.4 10*3/uL (ref 4.0–10.0)

## 2015-09-09 LAB — MANUAL DIFFERENTIAL (CHCC SATELLITE)
ALC: 2.5 10*3/uL (ref 0.9–3.3)
ANC (CHCC MAN DIFF): 2.6 10*3/uL (ref 1.5–6.5)
Band Neutrophils: 1 % (ref 0–10)
EOS: 1 % (ref 0–7)
LYMPH: 39 % (ref 14–48)
MONO: 19 % — AB (ref 0–13)
MYELOCYTES: 2 % — AB (ref 0–0)
Metamyelocytes: 2 % — ABNORMAL HIGH (ref 0–0)
PLT EST ~~LOC~~: DECREASED
SEG: 36 % — AB (ref 40–75)

## 2015-09-09 LAB — LIPID PANEL
CHOL/HDL RATIO: 3
CHOLESTEROL: 129 mg/dL (ref 0–200)
HDL: 48.6 mg/dL (ref >39.00)
LDL Cholesterol: 66 mg/dL (ref 0–99)
NonHDL: 80.47
TRIGLYCERIDES: 74 mg/dL (ref 0.0–149.0)
VLDL: 14.8 mg/dL (ref 0.0–40.0)

## 2015-09-09 LAB — FERRITIN: Ferritin: 1546 ng/ml — ABNORMAL HIGH (ref 22–316)

## 2015-09-09 LAB — PROTIME-INR (CHCC SATELLITE)
INR: 2.6 (ref 2.0–3.5)
PROTIME: 31.2 s — AB (ref 10.6–13.4)

## 2015-09-09 LAB — URIC ACID: Uric Acid, Serum: 5.6 mg/dL (ref 4.0–7.8)

## 2015-09-09 MED ORDER — ALLOPURINOL 300 MG PO TABS: 300.0000 mg | ORAL_TABLET | Freq: Every day | ORAL | 1 refills | Status: AC

## 2015-09-09 NOTE — Patient Instructions (Signed)
Anemia, Nonspecific Anemia is a condition in which the concentration of red blood cells or hemoglobin in the blood is below normal. Hemoglobin is a substance in red blood cells that carries oxygen to the tissues of the body. Anemia results in not enough oxygen reaching these tissues.  CAUSES  Common causes of anemia include:   Excessive bleeding. Bleeding may be internal or external. This includes excessive bleeding from periods (in women) or from the intestine.   Poor nutrition.   Chronic kidney, thyroid, and liver disease.  Bone marrow disorders that decrease red blood cell production.  Cancer and treatments for cancer.  HIV, AIDS, and their treatments.  Spleen problems that increase red blood cell destruction.  Blood disorders.  Excess destruction of red blood cells due to infection, medicines, and autoimmune disorders. SIGNS AND SYMPTOMS   Minor weakness.   Dizziness.   Headache.  Palpitations.   Shortness of breath, especially with exercise.   Paleness.  Cold sensitivity.  Indigestion.  Nausea.  Difficulty sleeping.  Difficulty concentrating. Symptoms may occur suddenly or they may develop slowly.  DIAGNOSIS  Additional blood tests are often needed. These help your health care provider determine the best treatment. Your health care provider will check your stool for blood and look for other causes of blood loss.  TREATMENT  Treatment varies depending on the cause of the anemia. Treatment can include:   Supplements of iron, vitamin B12, or folic acid.   Hormone medicines.   A blood transfusion. This may be needed if blood loss is severe.   Hospitalization. This may be needed if there is significant continual blood loss.   Dietary changes.  Spleen removal. HOME CARE INSTRUCTIONS Keep all follow-up appointments. It often takes many weeks to correct anemia, and having your health care provider check on your condition and your response to  treatment is very important. SEEK IMMEDIATE MEDICAL CARE IF:   You develop extreme weakness, shortness of breath, or chest pain.   You become dizzy or have trouble concentrating.  You develop heavy vaginal bleeding.   You develop a rash.   You have bloody or black, tarry stools.   You faint.   You vomit up blood.   You vomit repeatedly.   You have abdominal pain.  You have a fever or persistent symptoms for more than 2-3 days.   You have a fever and your symptoms suddenly get worse.   You are dehydrated.  MAKE SURE YOU:  Understand these instructions.  Will watch your condition.  Will get help right away if you are not doing well or get worse.   This information is not intended to replace advice given to you by your health care provider. Make sure you discuss any questions you have with your health care provider.   Document Released: 02/13/2004 Document Revised: 09/07/2012 Document Reviewed: 07/01/2012 Elsevier Interactive Patient Education 2016 [employer]  

## 2015-09-09 NOTE — Progress Notes (Signed)
He is  Hematology and Oncology Follow Up Visit  [name] [id number] February 21, 1927 82 y.o. 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  He is feeling quite good.   He did have his gallbladder taken out on June 28. This was an overnight stay in the hospital. The operative note looked pretty unremarkable. Everything went pretty well. There is no bleeding. He was off Coumadin and on Lovenox. He did not like being on Lovenox. He now is back on Coumadin. His INR is 2.6.  His last iron studies done back in June showed a ferritin of1426. His iron saturation was 49%.   He's had no fever. He has had no cough. He has had no obvious change in bowel or bladder habits. He has had no leg swelling. He has had no joint problems.   Overall, his performance status is ECOG 1.  Medications:  Current Outpatient Prescriptions:  .  carvedilol (COREG) 6.25 MG tablet, Take 1 tablet (6.25 mg total) by mouth 2 (two) times daily with a meal., Disp: 180 tablet, Rfl: 3 .  doxazosin (CARDURA) 4 MG tablet, Take 4 mg by mouth at bedtime. , Disp: , Rfl:  .  famotidine (PEPCID) 40 MG tablet, Take 80 mg by mouth daily. , Disp: , Rfl:  .  finasteride (PROSCAR) 5 MG tablet, Take 5 mg by mouth every evening. , Disp: , Rfl:  .  furosemide (LASIX) 40 MG tablet, Take 1 tablet (40 mg total) by mouth daily., Disp: 90 tablet, Rfl: 1 .  Multiple Vitamin (MULTIVITAMIN WITH MINERALS) TABS tablet, Take 1 tablet by mouth daily., Disp: , Rfl:  .  pyridoxine (B-6) 100 MG tablet, Take 200 mg by mouth daily. , Disp: , Rfl:  .  warfarin (COUMADIN) 5 MG tablet, TAKE ONE TABLET BY MOUTH ONCE DAILY AT SUPPER EXCEPT ON SUNDAY, Disp: 90 tablet, Rfl: 0 .  allopurinol  (ZYLOPRIM) 300 MG tablet, Take 1 tablet (300 mg total) by mouth at bedtime., Disp: 90 tablet, Rfl: 1  Allergies:  Allergies  Allergen Reactions  . Methylprednisolone Other (See Comments)    Dizziness, passed out twice  . Moxifloxacin Hcl In Nacl Other (See Comments) and Itching    Pt c/o being "out of his head" & itching  . Ramipril Other (See Comments)    Renal function worsened on ACEi.    Past Medical History, Surgical history, Social history, and Family History were reviewed and updated.  Review of Systems: As above  Physical Exam:  height is 6\' 2"  (1.88 m) and weight is 206 lb (93.4 kg). His oral temperature is 97.3 F (36.3 C). His blood pressure is 114/56 (abnormal) and his pulse is 80. His respiration is 16.   Well-developed and well-nourished white woman. Head and neck exam shows no ocular or oral lesion.  He has no palpable cervical or supraclavicular lymph nodes. Lungs are clear. He does arouse, wheezes or rhonchi. Cardiac exam is regular rate and rhythm. He has an occasional extra beat. His he has a 1/6 systolic murmur. Abdomen is soft.He has healing laparoscopy scars. These do not look inflamed or erythematous.He has good bowel sounds. There is no fluid wave. There is no palpable liver or spleen tip. Extremities  shows no clubbing, cyanosis or edema.He has  compression stockings on his legs. Skin exam shows no rashes, ecchymosis or petechia. Neurological exam shows no focal neurological deficits.  Lab Results  Component Value Date   WBC 6.4 09/09/2015   HGB 10.9 (L) 09/09/2015   HCT 33.2 (L) 09/09/2015   MCV 94 09/09/2015   PLT 99 (L) 09/09/2015     Chemistry      Component Value Date/Time   NA 139 07/17/2015 0441   NA 143 07/10/2015 0925   K 4.3 07/17/2015 0441   K 4.0 07/10/2015 0925   CL 107 07/17/2015 0441   CL 106 05/07/2014 1020   CO2 25 07/17/2015 0441   CO2 28 07/10/2015 0925   BUN 24 (H) 07/17/2015 0441   BUN 23.6 07/10/2015 0925   CREATININE 1.91 (H)  07/17/2015 0441   CREATININE 1.8 (H) 07/10/2015 0925      Component Value Date/Time   CALCIUM 8.4 (L) 07/17/2015 0441   CALCIUM 9.3 07/10/2015 0925   ALKPHOS 74 07/10/2015 0925   AST 20 07/10/2015 0925   ALT 21 07/10/2015 0925   BILITOT 1.12 07/10/2015 0925         Impression and Plan: [name] is a 82 year old gentleman. He has refractory anemia. We do not have to give him any Aranesp. He's done incredibly well with this.  On his blood smear, I do not see any evidence of transition over to leukemia.  For now, I think we will get him back in 2 months. I told him that I would be at the [facility]. He will follow me there. It probably is a little bit closer for him.    I do not see any problems with respect to him having iron overload.   I'm glad that his wife is doing a little bit better.   [name], MD 8/21/201711:49 AM

## 2015-09-09 NOTE — Progress Notes (Signed)
Pre visit review using our clinic review tool, if applicable. No additional management support is needed unless otherwise documented below in the visit note. 

## 2015-09-10 LAB — TSH: TSH: 3.77 u[IU]/mL (ref 0.35–4.50)

## 2015-09-11 ENCOUNTER — Other Ambulatory Visit: Payer: Medicare Other

## 2015-09-11 ENCOUNTER — Ambulatory Visit: Payer: Medicare Other | Admitting: Hematology & Oncology

## 2015-09-16 ENCOUNTER — Other Ambulatory Visit: Payer: Medicare Other

## 2015-09-16 ENCOUNTER — Ambulatory Visit (HOSPITAL_BASED_OUTPATIENT_CLINIC_OR_DEPARTMENT_OTHER)
Admission: RE | Admit: 2015-09-16 | Discharge: 2015-09-16 | Disposition: A | Discharge status: Home / Self Care | Payer: Medicare Other | Source: Ambulatory Visit | Attending: Family Medicine | Admitting: Family Medicine

## 2015-09-16 ENCOUNTER — Ambulatory Visit (INDEPENDENT_AMBULATORY_CARE_PROVIDER_SITE_OTHER): Payer: Medicare Other | Admitting: Family Medicine

## 2015-09-16 ENCOUNTER — Encounter: Payer: Self-pay | Admitting: Family Medicine

## 2015-09-16 VITALS — BP 112/64 | HR 91 | Temp 98.1°F | Ht 74.0 in | Wt 211.2 lb

## 2015-09-16 DIAGNOSIS — R05 Cough: Secondary | ICD-10-CM | POA: Diagnosis not present

## 2015-09-16 DIAGNOSIS — R509 Fever, unspecified: Secondary | ICD-10-CM | POA: Diagnosis not present

## 2015-09-16 DIAGNOSIS — D729 Disorder of white blood cells, unspecified: Secondary | ICD-10-CM

## 2015-09-16 LAB — COMPREHENSIVE METABOLIC PANEL
ALBUMIN: 3.8 g/dL (ref 3.5–5.2)
ALK PHOS: 142 U/L — AB (ref 39–117)
ALT: 26 U/L (ref 0–53)
AST: 22 U/L (ref 0–37)
BILIRUBIN TOTAL: 1.9 mg/dL — AB (ref 0.2–1.2)
BUN: 19 mg/dL (ref 6–23)
CO2: 26 mEq/L (ref 19–32)
CREATININE: 1.66 mg/dL — AB (ref 0.40–1.50)
Calcium: 8.9 mg/dL (ref 8.4–10.5)
Chloride: 105 mEq/L (ref 96–112)
GFR: 41.93 mL/min — ABNORMAL LOW (ref >60.00)
GLUCOSE: 96 mg/dL (ref 70–99)
Potassium: 3.9 mEq/L (ref 3.5–5.1)
SODIUM: 138 meq/L (ref 135–145)
TOTAL PROTEIN: 7.3 g/dL (ref 6.0–8.3)

## 2015-09-16 LAB — POC URINALSYSI DIPSTICK (AUTOMATED)
Bilirubin, UA: NEGATIVE
GLUCOSE UA: NEGATIVE
Ketones, UA: NEGATIVE
Leukocytes, UA: NEGATIVE
NITRITE UA: NEGATIVE
PROTEIN UA: NEGATIVE
RBC UA: NEGATIVE
SPEC GRAV UA: 1.015
UROBILINOGEN UA: NEGATIVE
pH, UA: 6

## 2015-09-16 LAB — SEDIMENTATION RATE: Sed Rate: 60 mm/hr — ABNORMAL HIGH (ref 0–20)

## 2015-09-16 LAB — CBC WITH DIFFERENTIAL/PLATELET
HCT: 28.4 % — ABNORMAL LOW (ref 39.0–52.0)
Hemoglobin: 9.3 g/dL — ABNORMAL LOW (ref 13.0–17.0)
MCHC: 32.9 g/dL (ref 30.0–36.0)
MCV: 91.8 fl (ref 78.0–100.0)
PLATELETS: 95 10*3/uL — AB (ref 150.0–400.0)
RBC: 3.09 Mil/uL — AB (ref 4.22–5.81)
RDW: 16.3 % — ABNORMAL HIGH (ref 11.5–15.5)

## 2015-09-16 MED ORDER — DOXYCYCLINE HYCLATE 100 MG PO TABS: 100.0000 mg | ORAL_TABLET | Freq: Two times a day (BID) | ORAL | 0 refills | Status: AC

## 2015-09-16 NOTE — Progress Notes (Signed)
Pre visit review using our clinic review tool, if applicable. No additional management support is needed unless otherwise documented below in the visit note. 

## 2015-09-16 NOTE — Patient Instructions (Addendum)
We are going to try and find any bacterial cause of your fevers by doing a urine and blood culture, and a chest x-ray.   We will also check other labs for any clues Because of the risk of tick borne illness we will start you on an antibiotic- doxycycline.  I will be in touch with your labs and other results asap If you have any more fevers or other symptoms please let me know right away. We will schedule a visit for you this week to check your INR- please see me on Thursday, appt at 11.  If you have any bleeding in the meantime please call me!

## 2015-09-16 NOTE — Progress Notes (Addendum)
[place] at [facility] [address] [place], [place] [address] [phone]  Date:  09/16/2015   Name:  [name]   DOB:  08/01/1927   MRN:  [id number]  PCP:  Penni Homans, MD    Chief Complaint: Fever (c/o fever that started Thurs 09/12/15 temp was 104, on Fri 101, Sun 99 and this morning 98.4. Pt states that his normal temp is usually in the 97 range. )   History of Present Illness:  [name] is a 82 y.o. very pleasant male patient who presents with the following: Here today with concern about fevers over the last several days Today is Monday.  This past Thursday his temp was 104.  He states that he just felt hot- did not have any chills or other specific symptoms He has not noted any chills, body aches, urinary sx, belly pain, rash, ST.  He has noted some cough but nothing out of the ordinary for him.  He hast noted mild headaches only  He has noted any tick bites, but he is outdoors a fair amount.  It is possible that a tick bit him and he did not notice  He has been taking tylenol for his fever- last dose was yesterday at 9pm.  We do manage his coumadin which he takes for permanent a fib.  He also has a history of MDD- see most recent labs, mild anemia and thrombocytopenia but normal wbc count No joint pains except for chronic left shoulder pain due to bursitis/ arthritis  He has not noted any CP, SOB, loss of appetite  Lab Results  Component Value Date   INR 2.6 09/09/2015   INR 2.6 09/02/2015   INR 2.3 08/01/2015   PROTIME 31.2 (H) 09/09/2015   PROTIME 39.6 (H) 10/02/2014   PROTIME 40.8 (H) 07/02/2014      Patient Active Problem List   Diagnosis Date Noted  . S/P laparoscopic cholecystectomy 07/16/2015  . Medicare annual wellness visit, subsequent 03/24/2015  . Acute upper respiratory infection 03/24/2015  . Chronic systolic heart failure ([place]) 11/11/2014  . Elevated LFTs 11/11/2014  . Abdominal  pain, epigastric 11/11/2014  . Preventative health care 09/09/2014  . Gout 08/27/2014  . Pleural effusion   . Permanent atrial fibrillation ([place]) 03/13/2013  . Stage III chronic kidney disease 02/06/2013  . Anemia 02/06/2013  . TIA (transient ischemic attack) 02/05/2013  . Complete heart block ([place]) 09/12/2011  . MDS (myelodysplastic syndrome), low grade ([place]) 02/10/2011  . Shoulder impingement syndrome 12/23/2010  . Chronic systolic congestive heart failure ([place]) 09/15/2010    Past Medical History:  Diagnosis Date  . Anemia   . Arthritis   . Bursitis    OF THE RIGHT SHOULDER  . CHF (congestive heart failure) ([place])   . Chronic kidney disease (CKD)    "they work at ~ 30%" (04/11/2014). Dr. Marcelline Deist released pt from office visit last week.  . Complete heart block ([place]) 12/28/06   s/p AV nodal ablation and BIV pacemaker implant at [facility] by Dr Rosita Fire  . GERD (gastroesophageal reflux disease)   . Gout   . H/O hiatal hernia   . History of blood transfusion   . Iron overload, transfusional 02/10/2011  . Kidney stones 1985  . MDS (myelodysplastic syndrome), low grade ([place]) 02/10/2011  . Permanent atrial fibrillation ([facility])    s/p AV nodal ablation  . Pneumonia    hx of   .  Presence of permanent cardiac pacemaker   . Preventative health care 09/09/2014   Dr Dorina Hoyer, urology, annually Dr Clover Mealy Dr Sharol Given Dr Ninfa Linden Dr Gershon Crane, opthamology Dr Rayann Heman cardiology Dr Marin Olp   . Tinnitus     Past Surgical History:  Procedure Laterality Date  . AV NODE ABLATION  12/08   at [facility]  . BUNIONECTOMY Bilateral 11/2012- 01/2013   "left-right; great toes; gout and bunion OR" (02/06/2013)  . CATARACT EXTRACTION W/ INTRAOCULAR LENS  IMPLANT, BILATERAL Bilateral   . CHOLECYSTECTOMY N/A 07/16/2015   Procedure: LAPAROSCOPIC CHOLECYSTECTOMY WITH INTRAOPERATIVE CHOLANGIOGRAM;  Surgeon: Greer Pickerel, MD;  Location: [facility] ORS;  Service: General;  Laterality: N/A;  . ERCP  N/A 11/27/2014   Procedure: ENDOSCOPIC RETROGRADE CHOLANGIOPANCREATOGRAPHY (ERCP);  Surgeon: Clarene Essex, MD;  Location: [facility];  Service: Endoscopy;  Laterality: N/A;  . EUS N/A 11/21/2014   Procedure: UPPER ENDOSCOPIC ULTRASOUND (EUS) RADIAL;  Surgeon: Arta Silence, MD;  Location: [facility] ENDOSCOPY;  Service: Endoscopy;  Laterality: N/A;  . INGUINAL HERNIA REPAIR Right   . INSERT / REPLACE / REMOVE PACEMAKER  12/28/2006;    MDT CRTP implanted 2008 by Dr Rosita Fire at [facility]; gen change 03/2013 by Dr Lovena Le  . KIDNEY STONE SURGERY  after 1985 kidney stone OR   "put a tube in my back to collect stones"  . LEAD REVISION N/A 04/11/2014   Procedure: LEAD REVISION;  Surgeon: [name], MD;  Location: [facility] CATH LAB;  Service: Cardiovascular;  Laterality: N/A;  . LITHOTRIPSY  1985  . PERMANENT PACEMAKER GENERATOR CHANGE N/A 04/11/2013   Procedure: PERMANENT PACEMAKER GENERATOR CHANGE;  Surgeon: Evans Lance, MD;  Location: [facility] CATH LAB;  Service: Cardiovascular;  Laterality: N/A;  . PILONIDAL CYST EXCISION  1970's  . TOTAL KNEE ARTHROPLASTY Bilateral    right Dec.2011,left June 2012    Social History  Substance Use Topics  . Smoking status: Former Smoker    Packs/day: 1.00    Years: 25.00    Types: Cigarettes    Start date: 02/23/1944    Quit date: 02/10/1968  . Smokeless tobacco: Never Used  . Alcohol use 0.0 oz/week     Comment: 04/11/2014 "drank a little bit from age [age]". Denies use now    Family History  Problem Relation Age of Onset  . Cancer Mother   . Melanoma Mother   . COPD Father   . Kidney disease Brother   . Heart disease Maternal Grandfather   . Heart attack Neg Hx   . Hypertension Neg Hx   . Stroke Neg Hx     Allergies  Allergen Reactions  . Methylprednisolone Other (See Comments)    Dizziness, passed out twice  . Moxifloxacin Hcl In Nacl Other (See Comments) and Itching    Pt c/o being "out of his head" & itching  . Ramipril Other (See Comments)    Renal  function worsened on ACEi.    Medication list has been reviewed and updated.  Current Outpatient Prescriptions on File Prior to Visit  Medication Sig Dispense Refill  . allopurinol (ZYLOPRIM) 300 MG tablet Take 1 tablet (300 mg total) by mouth at bedtime. 90 tablet 1  . carvedilol (COREG) 6.25 MG tablet Take 1 tablet (6.25 mg total) by mouth 2 (two) times daily with a meal. 180 tablet 3  . doxazosin (CARDURA) 4 MG tablet Take 4 mg by mouth at bedtime.     . famotidine (PEPCID) 40 MG tablet Take 80 mg by mouth daily.     Marland Kitchen  finasteride (PROSCAR) 5 MG tablet Take 5 mg by mouth every evening.     . furosemide (LASIX) 40 MG tablet Take 1 tablet (40 mg total) by mouth daily. 90 tablet 1  . Multiple Vitamin (MULTIVITAMIN WITH MINERALS) TABS tablet Take 1 tablet by mouth daily.    Marland Kitchen pyridoxine (B-6) 100 MG tablet Take 200 mg by mouth daily.     Marland Kitchen warfarin (COUMADIN) 5 MG tablet TAKE ONE TABLET BY MOUTH ONCE DAILY AT SUPPER EXCEPT ON SUNDAY 90 tablet 0   No current facility-administered medications on file prior to visit.     Review of Systems:  As per HPI- otherwise negative.   Physical Examination: Vitals:   09/16/15 1128  BP: 112/64  Pulse: 91  Temp: 98.1 F (36.7 C)   Vitals:   09/16/15 1128  Weight: 211 lb 3.2 oz (95.8 kg)  Height: '6\' 2"'$  (1.88 m)   Body mass index is 27.12 kg/m. Ideal Body Weight: Weight in (lb) to have BMI = 25: 194.3  GEN: WDWN, NAD, Non-toxic, A & O x 3, tall build. Looks very well.  Here today with his wife HEENT: Atraumatic, Normocephalic. Neck supple. No masses, No LAD.  Bilateral TM wnl, oropharynx normal.  PEERL,EOMI.   No meningismus  Ears and Nose: No external deformity. CV: RRR, No M/G/R. No JVD. No thrill. No extra heart sounds.  Pacer in place PULM: CTA B, no wheezes, crackles, rhonchi. No retractions. No resp. distress. No accessory muscle use. ABD: S, NT, ND, +BS. No rebound. No HSM.  Benign belly.  Hard scar at umbilicus from his lap chole  but this is stable and non- tender pr pt EXTR: No c/c/e. No rash noted  NEURO Normal gait.  PSYCH: Normally interactive. Conversant. Not depressed or anxious appearing.  Calm demeanor.    Assessment and Plan: Fever, unspecified - Plan: POCT Urinalysis Dipstick (Automated), Urine culture, CBC with Differential/Platelet, Comprehensive metabolic panel, DG Chest 2 View, Blood culture (routine single), doxycycline (VIBRA-TABS) 100 MG tablet, Sed Rate (ESR)  Here today with fevers of unknown origin. Will start evaluation as above. We live in a high risk area for RMSF so will start doxycycline while we await more information.  Will have him come in on Thursday to check his INR as he is on coumadin.  Explained to him that the combination of doxycycline and coumadin can increase risk of INR increase and bleeding.  He will report any abnormal bleeding or bruising  See patient instructions for more details.    Dg Chest 2 View  Result Date: 09/16/2015 CLINICAL DATA:  Cough and fever for 4 days. EXAM: CHEST  2 VIEW COMPARISON:  11/16/2013 FINDINGS: Two right ventricular pacer wires noted along with a coronary spinous lead. The heart is upper limits of normal in size and unchanged. Stable tortuosity and calcification of the thoracic aorta. No acute pulmonary findings. Tiny pleural effusions are possible. The bony thorax is intact. IMPRESSION: No acute pulmonary infiltrates. Possible tiny bilateral pleural effusions. Electronically Signed   By: Marijo Sanes M.D.   On: 09/16/2015 12:41   Results for orders placed or performed in visit on 09/16/15  CBC with Differential/Platelet  Result Value Ref Range   WBC  4.0 - 10.5 K/uL    6.4 abnormal cells  noted on smear referred for pathology review.   RBC 3.09 (L) 4.22 - 5.81 Mil/uL   Hemoglobin 9.3 (L) 13.0 - 17.0 g/dL   HCT 28.4 (L) 39.0 - 52.0 %  MCV 91.8 78.0 - 100.0 fl   MCHC 32.9 30.0 - 36.0 g/dL   RDW 16.3 (H) 11.5 - 15.5 %   Platelets 95.0 (L) 150.0 -  400.0 K/uL  Comprehensive metabolic panel  Result Value Ref Range   Sodium 138 135 - 145 mEq/L   Potassium 3.9 3.5 - 5.1 mEq/L   Chloride 105 96 - 112 mEq/L   CO2 26 19 - 32 mEq/L   Glucose, Bld 96 70 - 99 mg/dL   BUN 19 6 - 23 mg/dL   Creatinine, Ser 1.66 (H) 0.40 - 1.50 mg/dL   Total Bilirubin 1.9 (H) 0.2 - 1.2 mg/dL   Alkaline Phosphatase 142 (H) 39 - 117 U/L   AST 22 0 - 37 U/L   ALT 26 0 - 53 U/L   Total Protein 7.3 6.0 - 8.3 g/dL   Albumin 3.8 3.5 - 5.2 g/dL   Calcium 8.9 8.4 - 10.5 mg/dL   GFR 41.93 (L) >60.00 mL/min  Sed Rate (ESR)  Result Value Ref Range   Sed Rate 60 (H) 0 - 20 mm/hr  POCT Urinalysis Dipstick (Automated)  Result Value Ref Range   Color, UA Yellow    Clarity, UA Clear    Glucose, UA negative    Bilirubin, UA negative    Ketones, UA negative    Spec Grav, UA 1.015    Blood, UA negative    pH, UA 6.0    Protein, UA negative    Urobilinogen, UA negative    Nitrite, UA negative    Leukocytes, UA Negative Negative   Signed Lamar Blinks, MD Spoke with pt on 8/29- he reports that he feels well, no fevers since our visit yesterday.  Await the rest of his labs, he will let me know if fevers return  Called on 09/30/15 to speak to his wife Judeth Porch.  I had received a call from [facility] on 8/30 with a positive gram stain on his blood culture (eventually did come back as coag negative staph, most likely contaminant).  However when I went into his chart to call him we found that he suffered a cardiac arrest at home that very morning.  Judeth Porch reports that they were getting ready to go out in the morning, Jaelyn was taking longer than usual in the bathroom so she checked on him and found him down.  EMS came but they were not able to revive him.  Judeth Porch is coping well, states appreciation for our care and for the call. I asked her to please let me know if I can do anything to help

## 2015-09-17 LAB — URINE CULTURE: ORGANISM ID, BACTERIA: NO GROWTH

## 2015-09-17 LAB — PATHOLOGIST SMEAR REVIEW

## 2015-09-18 ENCOUNTER — Other Ambulatory Visit: Payer: Self-pay

## 2015-09-18 ENCOUNTER — Emergency Department (HOSPITAL_COMMUNITY)
Admission: EM | Admit: 2015-09-18 | Discharge: 2015-09-20 | Disposition: E | Payer: Medicare Other | Attending: Emergency Medicine | Admitting: Emergency Medicine

## 2015-09-18 ENCOUNTER — Emergency Department (HOSPITAL_COMMUNITY): Payer: Medicare Other

## 2015-09-18 ENCOUNTER — Telehealth: Payer: Self-pay | Admitting: *Deleted

## 2015-09-18 ENCOUNTER — Encounter (HOSPITAL_COMMUNITY): Payer: Self-pay | Admitting: Neurology

## 2015-09-18 DIAGNOSIS — I469 Cardiac arrest, cause unspecified: Secondary | ICD-10-CM

## 2015-09-18 DIAGNOSIS — Z8673 Personal history of transient ischemic attack (TIA), and cerebral infarction without residual deficits: Secondary | ICD-10-CM | POA: Insufficient documentation

## 2015-09-18 DIAGNOSIS — Z87891 Personal history of nicotine dependence: Secondary | ICD-10-CM | POA: Insufficient documentation

## 2015-09-18 DIAGNOSIS — R570 Cardiogenic shock: Secondary | ICD-10-CM

## 2015-09-18 DIAGNOSIS — Z7901 Long term (current) use of anticoagulants: Secondary | ICD-10-CM | POA: Insufficient documentation

## 2015-09-18 DIAGNOSIS — Z950 Presence of cardiac pacemaker: Secondary | ICD-10-CM | POA: Diagnosis not present

## 2015-09-18 DIAGNOSIS — N183 Chronic kidney disease, stage 3 (moderate): Secondary | ICD-10-CM | POA: Insufficient documentation

## 2015-09-18 DIAGNOSIS — Z96653 Presence of artificial knee joint, bilateral: Secondary | ICD-10-CM | POA: Diagnosis not present

## 2015-09-18 DIAGNOSIS — J9601 Acute respiratory failure with hypoxia: Secondary | ICD-10-CM

## 2015-09-18 DIAGNOSIS — G931 Anoxic brain damage, not elsewhere classified: Secondary | ICD-10-CM

## 2015-09-18 LAB — CBC
HEMATOCRIT: 27.8 % — AB (ref 39.0–52.0)
HEMOGLOBIN: 8.6 g/dL — AB (ref 13.0–17.0)
MCH: 29.8 pg (ref 26.0–34.0)
MCHC: 30.9 g/dL (ref 30.0–36.0)
MCV: 96.2 fL (ref 78.0–100.0)
Platelets: 134 10*3/uL — ABNORMAL LOW (ref 150–400)
RBC: 2.89 MIL/uL — AB (ref 4.22–5.81)
RDW: 15.5 % (ref 11.5–15.5)
WBC: 41.5 10*3/uL — AB (ref 4.0–10.5)

## 2015-09-18 LAB — I-STAT TROPONIN, ED
TROPONIN I, POC: 0.08 ng/mL (ref 0.00–0.08)
Troponin i, poc: 0.08 ng/mL (ref 0.00–0.08)

## 2015-09-18 LAB — I-STAT CHEM 8, ED
BUN: 24 mg/dL — ABNORMAL HIGH (ref 6–20)
CHLORIDE: 107 mmol/L (ref 101–111)
Calcium, Ion: 1.15 mmol/L (ref 1.15–1.40)
Creatinine, Ser: 1.8 mg/dL — ABNORMAL HIGH (ref 0.61–1.24)
Glucose, Bld: 153 mg/dL — ABNORMAL HIGH (ref 65–99)
HEMATOCRIT: 29 % — AB (ref 39.0–52.0)
HEMOGLOBIN: 9.9 g/dL — AB (ref 13.0–17.0)
POTASSIUM: 3.8 mmol/L (ref 3.5–5.1)
SODIUM: 142 mmol/L (ref 135–145)
TCO2: 23 mmol/L (ref 0–100)

## 2015-09-18 LAB — PROTIME-INR
INR: 5.68 — AB
Prothrombin Time: 52.9 seconds — ABNORMAL HIGH (ref 11.4–15.2)

## 2015-09-18 MED ORDER — SODIUM CHLORIDE 0.9 % IV BOLUS (SEPSIS)
1000.0000 mL | Freq: Once | INTRAVENOUS | Status: AC
  Administered 2015-09-18: 1000 mL via INTRAVENOUS

## 2015-09-18 MED ORDER — MORPHINE SULFATE (PF) 4 MG/ML IV SOLN: 5.0000 mg | Freq: Once | INTRAVENOUS | Status: DC

## 2015-09-18 MED ORDER — SODIUM CHLORIDE 0.9 % IV SOLN: INTRAVENOUS | Status: DC

## 2015-09-18 MED ORDER — MORPHINE SULFATE (PF) 2 MG/ML IV SOLN
INTRAVENOUS | Status: AC
  Filled 2015-09-18: qty 5

## 2015-09-18 MED ORDER — NOREPINEPHRINE BITARTRATE 1 MG/ML IV SOLN: 0.0000 ug/min | Freq: Once | INTRAVENOUS | Status: DC

## 2015-09-18 MED ORDER — MORPHINE SULFATE (PF) 4 MG/ML IV SOLN: 5.0000 mg | INTRAVENOUS | Status: DC | PRN

## 2015-09-18 MED ORDER — NOREPINEPHRINE BITARTRATE 1 MG/ML IV SOLN
0.0000 ug/min | INTRAVENOUS | Status: DC
  Filled 2015-09-18: qty 4

## 2015-09-19 ENCOUNTER — Ambulatory Visit: Payer: Medicare Other | Admitting: Family Medicine

## 2015-09-20 NOTE — Assessment & Plan Note (Signed)
Stable. No bleeding complaints.

## 2015-09-20 NOTE — Assessment & Plan Note (Signed)
No recent flares 

## 2015-09-20 NOTE — Telephone Encounter (Signed)
Caller: Dr. Gilford Raid, [facility] ED  Reason for call: Per Dr. Gilford Raid, pt presented to [facility] ED today after a V-fib arrest at home. Reports they were able to get him back for a short time but ultimately withdrew support and pt died in ED. Dr. Gilford Raid needs to know if Dr. Charlett Blake will sign the death certificate. ED can sign, but if they do they will have to involve the ME, so would prefer PCP to sign. Informed her that Dr. Charlett Blake is out of the office but that we will try to contact her and call back. Durward Fortes, RN texted Dr. Charlett Blake, awaiting response.    CB #: [phone]

## 2015-09-20 NOTE — ED Notes (Signed)
Dr. Nelda Marseille at bedside, going to remove ET tube. Will give morphine for comfort.

## 2015-09-20 NOTE — ED Notes (Signed)
Pt family at bedside with patient. Monitors turned off. Chaplain at bedside.

## 2015-09-20 NOTE — Progress Notes (Signed)
Patient ID: [name], male   DOB: [birth year], 82 y.o.   MRN: [id number]   Subjective:    Patient ID: [name], male    DOB: 14-Sep-1927, 82 y.o.   MRN: [id number]  Chief Complaint  Patient presents with  . Follow-up    HPI Patient is in today for follow up accompanied by his wife. No recent illness or acute concerns. Is tolerating meds and eating well. Denies CP/palp/SOB/HA/congestion/fevers/GI or GU c/o. Taking meds as prescribed  Past Medical History:  Diagnosis Date  . Anemia   . Arthritis   . Bursitis    OF THE RIGHT SHOULDER  . CHF (congestive heart failure) ([place])   . Chronic kidney disease (CKD)    "they work at ~ 30%" (04/11/2014). Dr. Marcelline Deist released pt from office visit last week.  . Complete heart block ([place]) 12/28/06   s/p AV nodal ablation and BIV pacemaker implant at [facility] by Dr Rosita Fire  . GERD (gastroesophageal reflux disease)   . Gout   . H/O hiatal hernia   . History of blood transfusion   . Iron overload, transfusional 02/10/2011  . Kidney stones 1985  . MDS (myelodysplastic syndrome), low grade ([place]) 02/10/2011  . Permanent atrial fibrillation (HCC)    s/p AV nodal ablation  . Pneumonia    hx of   . Presence of permanent cardiac pacemaker   . Preventative health care 09/09/2014   Dr Dorina Hoyer, urology, annually Dr Clover Mealy Dr Sharol Given Dr Ninfa Linden Dr Gershon Crane, opthamology Dr Rayann Heman cardiology Dr Marin Olp   . Tinnitus     Past Surgical History:  Procedure Laterality Date  . AV NODE ABLATION  12/08   at [facility]  . BUNIONECTOMY Bilateral 11/2012- 01/2013   "left-right; great toes; gout and bunion OR" (02/06/2013)  . CATARACT EXTRACTION W/ INTRAOCULAR LENS  IMPLANT, BILATERAL Bilateral   . CHOLECYSTECTOMY N/A 07/16/2015   Procedure: LAPAROSCOPIC CHOLECYSTECTOMY WITH INTRAOPERATIVE CHOLANGIOGRAM;  Surgeon: Greer Pickerel, MD;  Location: [facility] ORS;  Service: General;  Laterality: N/A;  . ERCP N/A 11/27/2014   Procedure: ENDOSCOPIC  RETROGRADE CHOLANGIOPANCREATOGRAPHY (ERCP);  Surgeon: Clarene Essex, MD;  Location: [facility];  Service: Endoscopy;  Laterality: N/A;  . EUS N/A 11/21/2014   Procedure: UPPER ENDOSCOPIC ULTRASOUND (EUS) RADIAL;  Surgeon: Arta Silence, MD;  [facility];  Service: Endoscopy;  Laterality: N/A;  . INGUINAL HERNIA REPAIR Right   . INSERT / REPLACE / REMOVE PACEMAKER  12/28/2006;    MDT CRTP implanted 2008 by Dr Rosita Fire at [facility]; gen change 03/2013 by Dr Lovena Le  . KIDNEY STONE SURGERY  after 1985 kidney stone OR   "put a tube in my back to collect stones"  . LEAD REVISION N/A 04/11/2014   Procedure: LEAD REVISION;  Surgeon: [name], MD;  Location: [facility] CATH LAB;  Service: Cardiovascular;  Laterality: N/A;  . LITHOTRIPSY  1985  . PERMANENT PACEMAKER GENERATOR CHANGE N/A 04/11/2013   Procedure: PERMANENT PACEMAKER GENERATOR CHANGE;  Surgeon: Evans Lance, MD;  Location: [facility] CATH LAB;  Service: Cardiovascular;  Laterality: N/A;  . PILONIDAL CYST EXCISION  1970's  . TOTAL KNEE ARTHROPLASTY Bilateral    right Dec.2011,left June 2012    Family History  Problem Relation Age of Onset  . Cancer Mother   . Melanoma Mother   . COPD Father   . Kidney disease Brother   . Heart disease Maternal Grandfather   . Heart attack Neg Hx   . Hypertension Neg Hx   .  Stroke Neg Hx     Social History   Social History  . Marital status: Married    Spouse name: N/A  . Number of children: N/A  . Years of education: N/A   Occupational History  . Not on file.   Social History Main Topics  . Smoking status: Former Smoker    Packs/day: 1.00    Years: 25.00    Types: Cigarettes    Start date: 02/23/1944    Quit date: 02/10/1968  . Smokeless tobacco: Never Used  . Alcohol use 0.0 oz/week     Comment: 04/11/2014 "drank a little bit from age [age]". Denies use now  . Drug use: No  . Sexual activity: No     Comment: lives with wife, retired as a Scientist, research (life sciences), no dietary restrictions    Other Topics Concern  . Not on file   Social History Narrative  . No narrative on file    Outpatient Medications Prior to Visit  Medication Sig Dispense Refill  . carvedilol (COREG) 6.25 MG tablet Take 1 tablet (6.25 mg total) by mouth 2 (two) times daily with a meal. 180 tablet 3  . doxazosin (CARDURA) 4 MG tablet Take 4 mg by mouth at bedtime.     . famotidine (PEPCID) 40 MG tablet Take 80 mg by mouth daily.     . finasteride (PROSCAR) 5 MG tablet Take 5 mg by mouth every evening.     . furosemide (LASIX) 40 MG tablet Take 1 tablet (40 mg total) by mouth daily. 90 tablet 1  . Multiple Vitamin (MULTIVITAMIN WITH MINERALS) TABS tablet Take 1 tablet by mouth daily.    Marland Kitchen pyridoxine (B-6) 100 MG tablet Take 200 mg by mouth daily.     Marland Kitchen warfarin (COUMADIN) 5 MG tablet TAKE ONE TABLET BY MOUTH ONCE DAILY AT SUPPER EXCEPT ON SUNDAY 90 tablet 0  . allopurinol (ZYLOPRIM) 300 MG tablet Take 1 tablet (300 mg total) by mouth at bedtime. 90 tablet 1  . enoxaparin (LOVENOX) 40 MG/0.4ML injection Inject 0.4 mLs (40 mg total) into the skin every 12 (twelve) hours. 6 Syringe 0  . oxyCODONE (OXY IR/ROXICODONE) 5 MG immediate release tablet Take 0.5-1 tablets (2.5-5 mg total) by mouth every 4 (four) hours as needed for moderate pain. 30 tablet 0  . warfarin (COUMADIN) 5 MG tablet Take 2.5-5 mg by mouth daily. Take 1 tablet (19m) all days except on Mon, Wed, Fri take 1/2 tablet (2.517m     No facility-administered medications prior to visit.     Allergies  Allergen Reactions  . Methylprednisolone Other (See Comments)    Dizziness, passed out twice  . Moxifloxacin Hcl In Nacl Other (See Comments) and Itching    Pt c/o being "out of his head" & itching  . Ramipril Other (See Comments)    Renal function worsened on ACEi.    Review of Systems  Constitutional: Positive for malaise/fatigue. Negative for fever.  HENT: Negative for congestion.   Eyes: Negative for blurred vision.  Respiratory:  Negative for shortness of breath.   Cardiovascular: Negative for chest pain, palpitations and leg swelling.  Gastrointestinal: Negative for abdominal pain, blood in stool and nausea.  Genitourinary: Negative for dysuria and frequency.  Musculoskeletal: Negative for falls.  Skin: Negative for rash.  Neurological: Negative for dizziness, loss of consciousness and headaches.  Endo/Heme/Allergies: Negative for environmental allergies.  Psychiatric/Behavioral: Negative for depression. The patient is not nervous/anxious.        Objective:  Physical Exam  Constitutional: He is oriented to person, place, and time. He appears well-developed and well-nourished. No distress.  HENT:  Head: Normocephalic and atraumatic.  Nose: Nose normal.  Eyes: Right eye exhibits no discharge. Left eye exhibits no discharge.  Neck: Normal range of motion. Neck supple.  Cardiovascular: Normal rate.   Pulmonary/Chest: Effort normal and breath sounds normal.  Abdominal: Soft. Bowel sounds are normal. There is no tenderness.  Musculoskeletal: He exhibits no edema.  Neurological: He is alert and oriented to person, place, and time.  Skin: Skin is warm and dry.  Psychiatric: He has a normal mood and affect.  Nursing note and vitals reviewed.   BP (!) 104/58 (BP Location: Right Arm, Patient Position: Sitting, Cuff Size: Large)   Pulse 81   Temp 98.1 F (36.7 C) (Oral)   Ht _0  (1.88 m)   Wt 207 lb 6 oz (94.1 kg)   SpO2 97%   BMI 26.63 kg/m  Wt Readings from Last 3 Encounters:  October 03, 2015 211 lb (95.7 kg)  09/16/15 211 lb 3.2 oz (95.8 kg)  09/09/15 206 lb (93.4 kg)     Lab Results  Component Value Date   WBC 41.5 (H) 10/03/2015   HGB 9.9 (L) 10-03-2015   HCT 29.0 (L) 10/03/2015   PLT 134 (L) October 03, 2015   GLUCOSE 153 (H) 10/03/15   CHOL 129 09/09/2015   TRIG 74.0 09/09/2015   HDL 48.60 09/09/2015   LDLCALC 66 09/09/2015   ALT 26 09/16/2015   AST 22 09/16/2015   NA 142 10-03-15   K 3.8  03-Oct-2015   CL 107 10/03/2015   CREATININE 1.80 (H) 10/03/2015   BUN 24 (H) 10/03/15   CO2 26 09/16/2015   TSH 3.77 09/09/2015   INR 5.68 (HH) 03-Oct-2015   HGBA1C 5.0 02/06/2013    Lab Results  Component Value Date   TSH 3.77 09/09/2015   Lab Results  Component Value Date   WBC 41.5 (H) 10/03/2015   HGB 9.9 (L) 2015-10-03   HCT 29.0 (L) October 03, 2015   MCV 96.2 October 03, 2015   PLT 134 (L) 2015-10-03   Lab Results  Component Value Date   NA 142 Oct 03, 2015   K 3.8 10/03/2015   CHLORIDE 107 07/10/2015   CO2 26 09/16/2015   GLUCOSE 153 (H) 10-03-2015   BUN 24 (H) 10-03-15   CREATININE 1.80 (H) Oct 03, 2015   BILITOT 1.9 (H) 09/16/2015   ALKPHOS 142 (H) 09/16/2015   AST 22 09/16/2015   ALT 26 09/16/2015   PROT 7.3 09/16/2015   ALBUMIN 3.8 09/16/2015   CALCIUM 8.9 09/16/2015   ANIONGAP 7 07/17/2015   EGFR 34 (L) 07/10/2015   GFR 41.93 (L) 09/16/2015   Lab Results  Component Value Date   CHOL 129 09/09/2015   Lab Results  Component Value Date   HDL 48.60 09/09/2015   Lab Results  Component Value Date   LDLCALC 66 09/09/2015   Lab Results  Component Value Date   TRIG 74.0 09/09/2015   Lab Results  Component Value Date   CHOLHDL 3 09/09/2015   Lab Results  Component Value Date   HGBA1C 5.0 02/06/2013       Assessment & Plan:   Problem List Items Addressed This Visit    Chronic systolic congestive heart failure ([place])   Relevant Orders   TSH (Completed)   Stage III chronic kidney disease    Stable disease      Anemia    Stable. No bleeding complaints.  Permanent atrial fibrillation (HCC)    Asymptomatic, tolerating medications      Gout - Primary    No recent flares      Relevant Orders   Uric acid (Completed)   Elevated LFTs   Relevant Orders   Comprehensive metabolic panel (Completed)    Other Visit Diagnoses    Hyperlipidemia, mild       Relevant Orders   Lipid panel (Completed)      I have discontinued Mr. Surette's  oxyCODONE and enoxaparin. I am also having him maintain his pyridoxine, finasteride, multivitamin with minerals, doxazosin, famotidine, carvedilol, furosemide, warfarin, and allopurinol.  Meds ordered this encounter  Medications  . allopurinol (ZYLOPRIM) 300 MG tablet    Sig: Take 1 tablet (300 mg total) by mouth at bedtime.    Dispense:  90 tablet    Refill:  1     Penni Homans, MD

## 2015-09-20 NOTE — Assessment & Plan Note (Signed)
Stable disease

## 2015-09-20 NOTE — Progress Notes (Signed)
Called by EDP to evaluate patient post cardiac arrest.  Patient is 82 year old and received CPR for over an hour.  The patient is now hypotensive in the 50's and exhibiting myoclonic jerks.  No central line has been placed for pressors.  Chances of any meaningful recovery here is negligible given downtime.  I spoke with wife and two sons, they clearly expressed that patient would have never wanted this, he has stated that clearly.  Given that statistically, chances at complete recovery is negligible so will proceed with giving patient 5 mg of IV morphine then extubate when family is ready.  Will be available PRN, PCCM will sign off.  The patient is critically ill with multiple organ systems failure and requires high complexity decision making for assessment and support, frequent evaluation and titration of therapies, application of advanced monitoring technologies and extensive interpretation of multiple databases.   Critical Care Time devoted to patient care services described in this note is  35  Minutes. This time reflects time of care of this signee Dr Jennet Maduro. This critical care time does not reflect procedure time, or teaching time or supervisory time of PA/NP/Med student/Med Resident etc but could involve care discussion time.  Rush Farmer, M.D. [facility] Pulmonary/Critical Care Medicine. Pager: [phone]. After hours pager: [phone].

## 2015-09-20 NOTE — ED Provider Notes (Signed)
[facility] DEPT Provider Note   CSN: [id number] Arrival date & time: 09-28-15  1130     History   Chief Complaint Chief Complaint  Patient presents with  . Cardiac Arrest    HPI [name] is a 82 y.o. male.  Pt presents to the ED today with cardiac arrest.  He was found in the bathroom this morning by his wife.  She called EMS who started CPR and shocked him 5 times.  EMS gave pt a total of 12 epi, 450 of amiodarone.  They shocked him an additional 3 times.  The pt did have ROSC at 1110.  Pt is orally intubated and is unresponsive upon arrival here.      Past Medical History:  Diagnosis Date  . Anemia   . Arthritis   . Bursitis    OF THE RIGHT SHOULDER  . CHF (congestive heart failure) ([place])   . Chronic kidney disease (CKD)    "they work at ~ 30%" (04/11/2014). Dr. Marcelline Deist released pt from office visit last week.  . Complete heart block ([place]) 12/28/06   s/p AV nodal ablation and BIV pacemaker implant at [facility] by Dr Rosita Fire  . GERD (gastroesophageal reflux disease)   . Gout   . H/O hiatal hernia   . History of blood transfusion   . Iron overload, transfusional 02/10/2011  . Kidney stones 1985  . MDS (myelodysplastic syndrome), low grade ([place]) 02/10/2011  . Permanent atrial fibrillation (HCC)    s/p AV nodal ablation  . Pneumonia    hx of   . Presence of permanent cardiac pacemaker   . Preventative health care 09/09/2014   Dr Dorina Hoyer, urology, annually Dr Clover Mealy Dr Sharol Given Dr Ninfa Linden Dr Gershon Crane, opthamology Dr Rayann Heman cardiology Dr Marin Olp   . Tinnitus     Patient Active Problem List   Diagnosis Date Noted  . S/P laparoscopic cholecystectomy 07/16/2015  . Medicare annual wellness visit, subsequent 03/24/2015  . Acute upper respiratory infection 03/24/2015  . Chronic systolic heart failure ([place]) 11/11/2014  . Elevated LFTs 11/11/2014  . Abdominal pain, epigastric 11/11/2014  . Preventative health care 09/09/2014  . Gout 08/27/2014  .  Pleural effusion   . Permanent atrial fibrillation ([place]) 03/13/2013  . Stage III chronic kidney disease 02/06/2013  . Anemia 02/06/2013  . TIA (transient ischemic attack) 02/05/2013  . Complete heart block ([place]) 09/12/2011  . MDS (myelodysplastic syndrome), low grade ([place]) 02/10/2011  . Shoulder impingement syndrome 12/23/2010  . Chronic systolic congestive heart failure ([place]) 09/15/2010    Past Surgical History:  Procedure Laterality Date  . AV NODE ABLATION  12/08   at [facility]  . BUNIONECTOMY Bilateral 11/2012- 01/2013   "left-right; great toes; gout and bunion OR" (02/06/2013)  . CATARACT EXTRACTION W/ INTRAOCULAR LENS  IMPLANT, BILATERAL Bilateral   . CHOLECYSTECTOMY N/A 07/16/2015   Procedure: LAPAROSCOPIC CHOLECYSTECTOMY WITH INTRAOPERATIVE CHOLANGIOGRAM;  Surgeon: Greer Pickerel, MD;  [facility] ORS;  Service: General;  Laterality: N/A;  . ERCP N/A 11/27/2014   Procedure: ENDOSCOPIC RETROGRADE CHOLANGIOPANCREATOGRAPHY (ERCP);  Surgeon: Clarene Essex, MD;  Location: [facility];  Service: Endoscopy;  Laterality: N/A;  . EUS N/A 11/21/2014   Procedure: UPPER ENDOSCOPIC ULTRASOUND (EUS) RADIAL;  Surgeon: Arta Silence, MD;  Location: [facility] ENDOS[facility];  Service: Endoscopy;  Laterality: N/A;  . INGUINAL HERNIA REPAIR Right   . INSERT / REPLACE / REMOVE PACEMAKER  12/28/2006;    MDT CRTP implanted 2008 by Dr Rosita Fire at [facility]; gen change 03/2013  by Dr Lovena Le  . KIDNEY STONE SURGERY  after 1985 kidney stone OR   "put a tube in my back to collect stones"  . LEAD REVISION N/A 04/11/2014   Procedure: LEAD REVISION;  Surgeon: [name], MD;  Location: [facility] CATH LAB;  Service: Cardiovascular;  Laterality: N/A;  . LITHOTRIPSY  1985  . PERMANENT PACEMAKER GENERATOR CHANGE N/A 04/11/2013   Procedure: PERMANENT PACEMAKER GENERATOR CHANGE;  Surgeon: Evans Lance, MD;  Location: [facility] CATH LAB;  Service: Cardiovascular;  Laterality: N/A;  . PILONIDAL CYST EXCISION  1970's  . TOTAL KNEE  ARTHROPLASTY Bilateral    right Dec.2011,left June 2012       Home Medications    Prior to Admission medications   Medication Sig Start Date End Date Taking? Authorizing Provider  allopurinol (ZYLOPRIM) 300 MG tablet Take 1 tablet (300 mg total) by mouth at bedtime. 09/09/15   Mosie Lukes, MD  carvedilol (COREG) 6.25 MG tablet Take 1 tablet (6.25 mg total) by mouth 2 (two) times daily with a meal. 06/27/15   [name], MD  doxazosin (CARDURA) 4 MG tablet Take 4 mg by mouth at bedtime.     Historical Provider, MD  doxycycline (VIBRA-TABS) 100 MG tablet Take 1 tablet (100 mg total) by mouth 2 (two) times daily. 09/16/15   Gay Filler Copland, MD  famotidine (PEPCID) 40 MG tablet Take 80 mg by mouth daily.     Historical Provider, MD  finasteride (PROSCAR) 5 MG tablet Take 5 mg by mouth every evening.     Historical Provider, MD  furosemide (LASIX) 40 MG tablet Take 1 tablet (40 mg total) by mouth daily. 07/09/15   [name], MD  Multiple Vitamin (MULTIVITAMIN WITH MINERALS) TABS tablet Take 1 tablet by mouth daily.    Historical Provider, MD  pyridoxine (B-6) 100 MG tablet Take 200 mg by mouth daily.     Historical Provider, MD  warfarin (COUMADIN) 5 MG tablet TAKE ONE TABLET BY MOUTH ONCE DAILY AT SUPPER EXCEPT ON SUNDAY 08/20/15   Mosie Lukes, MD    Family History Family History  Problem Relation Age of Onset  . Cancer Mother   . Melanoma Mother   . COPD Father   . Kidney disease Brother   . Heart disease Maternal Grandfather   . Heart attack Neg Hx   . Hypertension Neg Hx   . Stroke Neg Hx     Social History Social History  Substance Use Topics  . Smoking status: Former Smoker    Packs/day: 1.00    Years: 25.00    Types: Cigarettes    Start date: 02/23/1944    Quit date: 02/10/1968  . Smokeless tobacco: Never Used  . Alcohol use 0.0 oz/week     Comment: 04/11/2014 "drank a little bit from age [age]". Denies use now     Allergies   Methylprednisolone; Moxifloxacin  hcl in nacl; and Ramipril   Review of Systems Review of Systems  Unable to perform ROS: Patient unresponsive     Physical Exam Updated Vital Signs BP (!) 0/0   Pulse (!) 0   Wt 211 lb (95.7 kg)   SpO2 (!) 84%   BMI 27.09 kg/m   Physical Exam  Constitutional: He appears distressed.  HENT:  Head: Normocephalic and atraumatic.  Right Ear: External ear normal.  Left Ear: External ear normal.  Nose: Nose normal.  Oral endotracheal tube in place  Eyes: Pupils are equal, round, and reactive to light.  Cardiovascular: Normal rate, regular rhythm and normal heart sounds.   Pt does have a pulse at the carotid, but they are very faint in the femoral area.  Pulmonary/Chest:  Some spontaneous breathing.  BS bilateral, but with rhonchi.  Abdominal: Soft. Bowel sounds are normal.  Neurological:  Pt unresponsive  Nursing note and vitals reviewed.    ED Treatments / Results  Labs (all labs ordered are listed, but only abnormal results are displayed) Labs Reviewed  CBC - Abnormal; Notable for the following:       Result Value   WBC 41.5 (*)    RBC 2.89 (*)    Hemoglobin 8.6 (*)    HCT 27.8 (*)    Platelets 134 (*)    All other components within normal limits  PROTIME-INR - Abnormal; Notable for the following:    Prothrombin Time 52.9 (*)    INR 5.68 (*)    All other components within normal limits  I-STAT CHEM 8, ED - Abnormal; Notable for the following:    BUN 24 (*)    Creatinine, Ser 1.80 (*)    Glucose, Bld 153 (*)    Hemoglobin 9.9 (*)    HCT 29.0 (*)    All other components within normal limits  I-STAT TROPOININ, ED  I-STAT TROPOININ, ED  I-STAT TROPOININ, ED    EKG  EKG Interpretation None      Ekg:  Ventricular paced rhythm rate of 78.  Radiology Dg Chest Portable 1 View  Result Date: 2015-10-16 CLINICAL DATA:  Intubation, respiratory distress, history CHF, atrial fibrillation, heart block, kidney disease, pneumonia EXAM: PORTABLE CHEST 1 VIEW  COMPARISON:  Portable exam 1148 hours compared 09/16/2015 FINDINGS: Tip of endotracheal tube projects approximately 6.8 cm above carina. LEFT subclavian pacemaker leads project over RIGHT ventricle and coronary sinus. Numerous EKG leads project over chest as well as external pacing leads. Enlargement of cardiac silhouette. Diffuse BILATERAL airspace infiltrates new since previous exam question pulmonary edema versus pneumonia. No pleural effusion or pneumothorax. Diffuse osseous demineralization. IMPRESSION: Endotracheal tube tip projects 6.8 cm above carina. Enlargement of cardiac silhouette post pacemaker with diffuse BILATERAL airspace infiltrates which could represent pulmonary edema or multifocal pneumonia. Electronically Signed   By: Lavonia Dana M.D.   On: 2015/10/16 12:01    Procedures .Critical Care Performed by: Isla Pence Authorized by: Isla Pence   Critical care provider statement:    Critical care time (minutes):  30   Critical care time was exclusive of:  Separately billable procedures and treating other patients   Critical care was necessary to treat or prevent imminent or life-threatening deterioration of the following conditions:  Cardiac failure and circulatory failure   Critical care was time spent personally by me on the following activities:  Development of treatment plan with patient or surrogate, discussions with consultants, discussions with primary provider, evaluation of patient's response to treatment, examination of patient, ordering and performing treatments and interventions, ordering and review of laboratory studies, ordering and review of radiographic studies, pulse oximetry and re-evaluation of patient's condition    (including critical care time)  Medications Ordered in ED Medications  sodium chloride 0.9 % bolus 1,000 mL (0 mLs Intravenous Stopped 10-16-15 1258)    And  0.9 %  sodium chloride infusion (not administered)  morphine 4 MG/ML injection 5 mg  (not administered)  morphine 2 MG/ML injection (not administered)  morphine 4 MG/ML injection 5 mg (not administered)     Initial Impression / Assessment and Plan /  ED Course  I have reviewed the triage vital signs and the nursing notes.  Pertinent labs & imaging results that were available during my care of the patient were reviewed by me and considered in my medical decision making (see chart for details).  Clinical Course   Pt's manual bp 60/40 and he is not perfusing well.  He has a GCS of 3.  I spoke with Dr. Nelda Marseille (critical care) who came to see patient.  He does not think this is a survivable event.  He spoke to the family and they agreed to withdrawal support.    Support was withdrawn and TOD at 1235.  Pt's PCP will be notified.  Dr. Charlett Blake notified and will sign the death certificate.  Final Clinical Impressions(s) / ED Diagnoses   Final diagnoses:  Cardiac arrest [facility])    New Prescriptions Discharge Medication List as of 10/12/2015  1:45 PM       Isla Pence, MD 10/12/2015 [phone]

## 2015-09-20 NOTE — Telephone Encounter (Signed)
Dr. Charlett Blake called back and said she would sign death certificate.  Called ED and spoke to [facility] to make ED aware.

## 2015-09-20 NOTE — ED Notes (Signed)
Pt will be transported to the morgue. Only belongings, is pair of white underwear.

## 2015-09-20 NOTE — Assessment & Plan Note (Signed)
Asymptomatic, tolerating medications

## 2015-09-20 NOTE — ED Triage Notes (Signed)
Per ems- Pt found on the bathroom floor by his wife, was LSN at 0930. EMS arrived at 1030, fire dept had already started CPR and defib x 5. Given total 12 epi, 450 amnio. EMS defib x 3. ROSC at 1110. Estimated 45 mins CPR. 2 L cold saline infusing.

## 2015-09-20 NOTE — ED Notes (Signed)
Pt family arrived. Dr. Gilford Raid to speak with family. Critical Care NP arrived at bedside.

## 2015-09-20 NOTE — ED Notes (Addendum)
TOD 1235 pronounced by Dr. Gilford Raid

## 2015-09-20 DEATH — deceased

## 2015-09-21 LAB — CULTURE, BLOOD (SINGLE)

## 2015-10-01 ENCOUNTER — Ambulatory Visit: Payer: Medicare Other

## 2015-10-02 ENCOUNTER — Ambulatory Visit: Payer: Medicare Other

## 2015-10-08 ENCOUNTER — Telehealth: Payer: Self-pay | Admitting: Family Medicine

## 2015-10-09 ENCOUNTER — Telehealth: Payer: Self-pay | Admitting: Family Medicine

## 2015-10-09 NOTE — Telephone Encounter (Signed)
Patients wife and [facility] called to follow up on the Death Certificate. Wanted to know if it had been signed and mailed. Plse adv

## 2015-10-10 NOTE — Telephone Encounter (Signed)
Spoke to the wife and they have waited 3 weeks for this.  PCP stated she did sign it last week.  Will check with ashlee as to the location of it.

## 2015-10-10 NOTE — Telephone Encounter (Signed)
Patient informed and they have received it.

## 2015-10-10 NOTE — Telephone Encounter (Signed)
I did it last week some time, did not get to me for quite some time. Please make sure it gets to them, no sure who would have sent it back out

## 2015-10-10 NOTE — Telephone Encounter (Signed)
Called left msg to call back. 

## 2015-10-11 NOTE — Telephone Encounter (Signed)
v

## 2015-10-13 IMAGING — CT CT CHEST W/O CM
2 of 4 series · 14 of 36 positions shown, 17 images · non-contrast
Comparison: Chest CT 10/19/2013.

CLINICAL DATA: Subsequent evaluation of 86-year-old male with
pneumonia and worsening hypoxemia. Evaluate for worsening pleural
effusion, potential empyema. Cough and hemoptysis with shortness of
breath and low-grade fever. History of myelodysplastic syndrome.

EXAM:
CT CHEST WITHOUT CONTRAST
TECHNIQUE: Multidetector CT imaging of the chest was performed following the
standard protocol without IV contrast..

[Series 2: rtn chest without st · axial · non-contrast · 0.80mm/px · z∈[-287,+13]mm · 11 of 72 slices shown, 14 images]
[im 6/72  mediastinal]
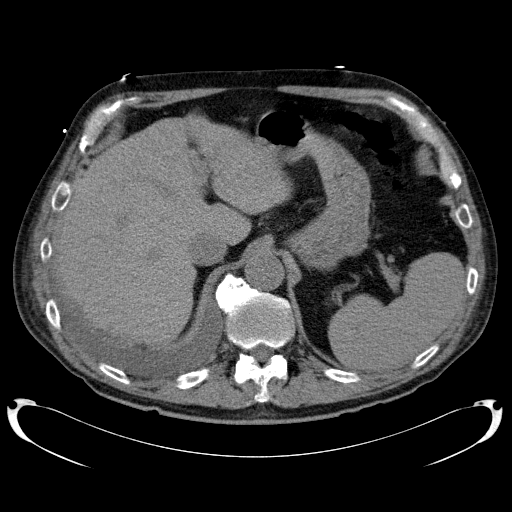
[im 6/72  lung]
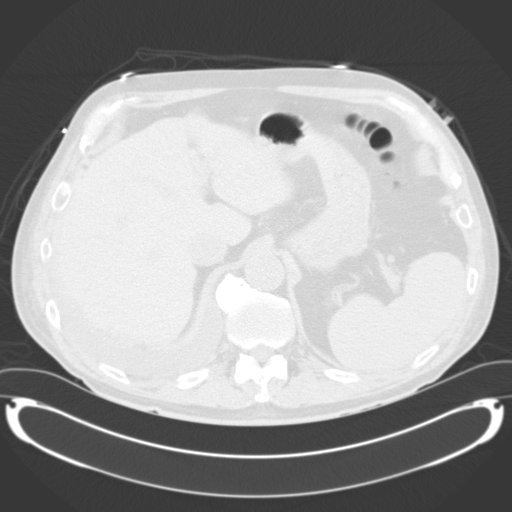
[im 11/72  lung]
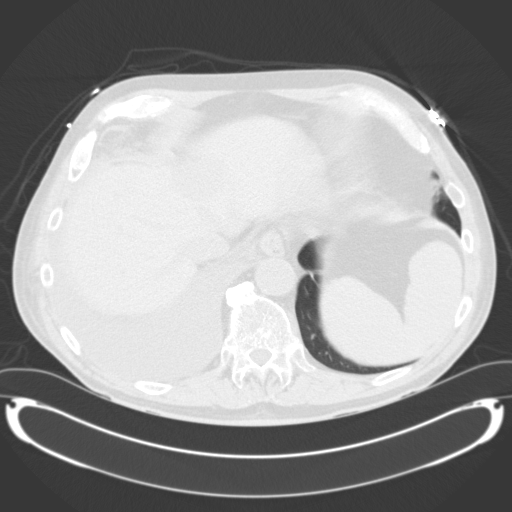
[im 17/72  lung]
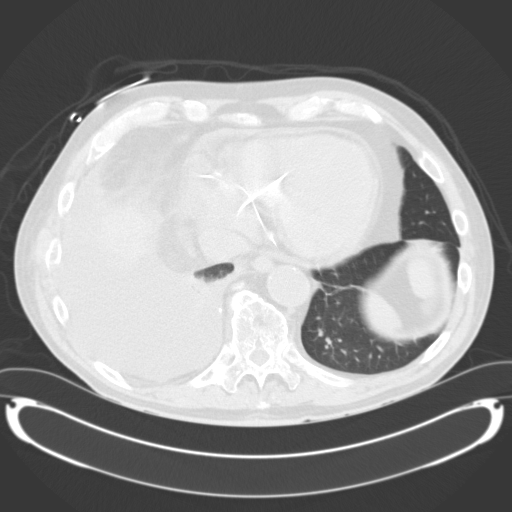
[im 22/72  lung]
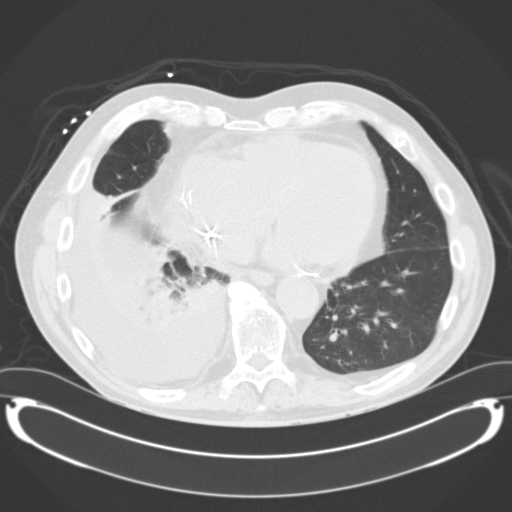
[im 28/72  mediastinal]
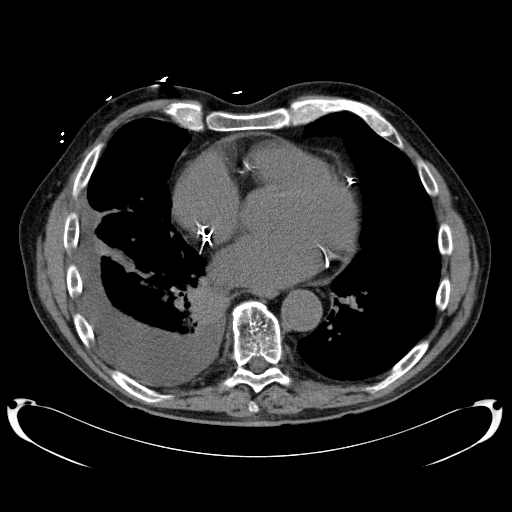
[im 28/72  lung]
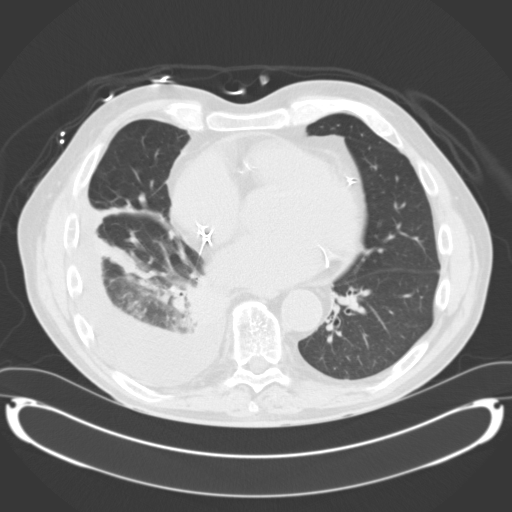
[im 39/72  lung]
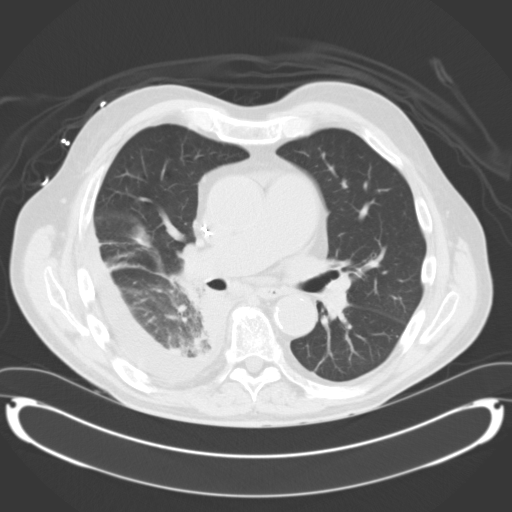
[im 44/72  lung]
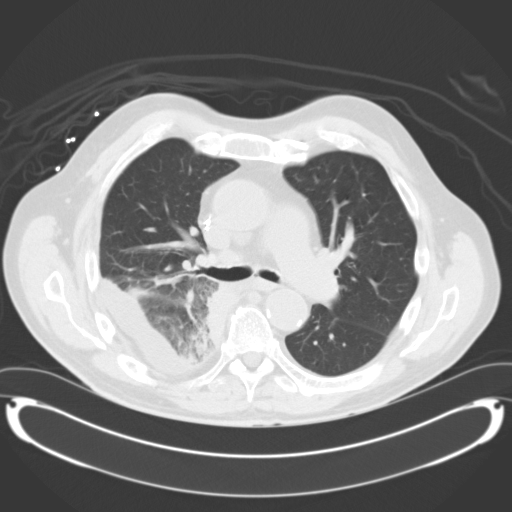
[im 50/72  lung]
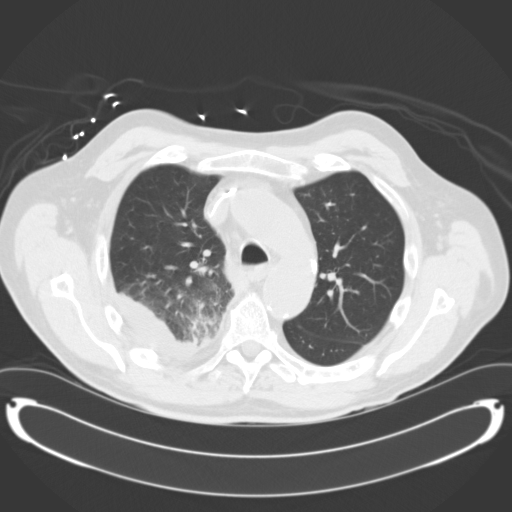
[im 55/72  mediastinal]
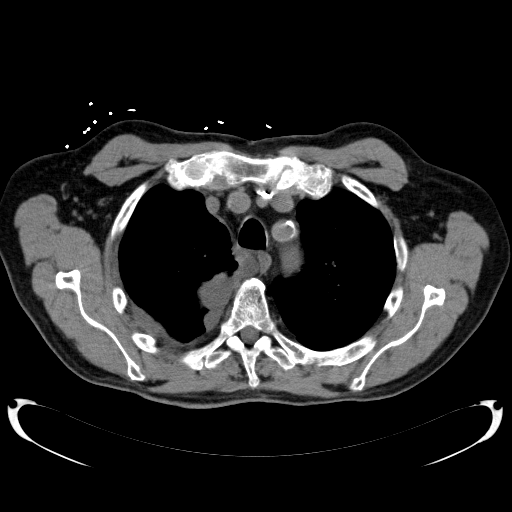
[im 55/72  lung]
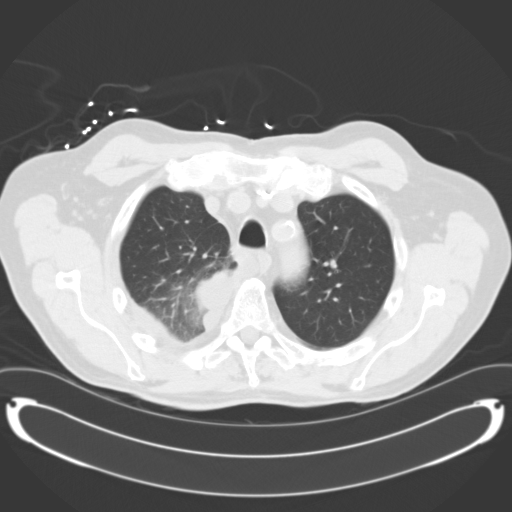
[im 61/72  lung]
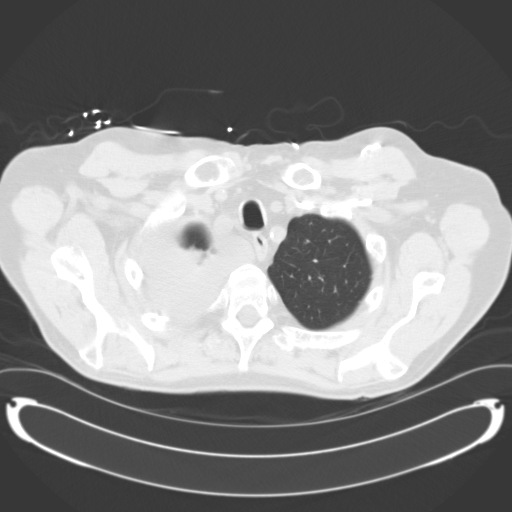
[im 66/72  lung]
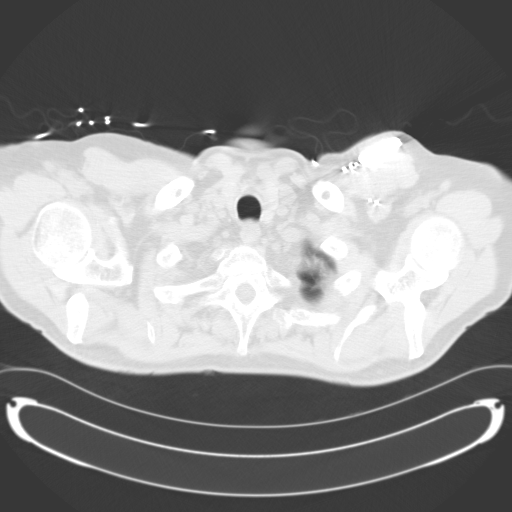

[Series 602: coronal images · coronal · 0.80mm/px · 3 of 97 slices shown]
[im 20/97  lung]
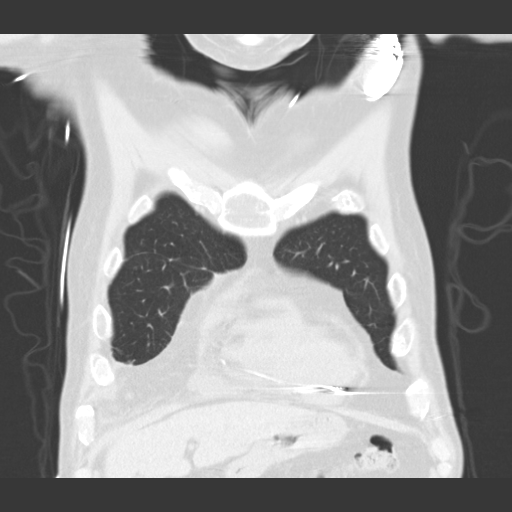
[im 39/97  lung]
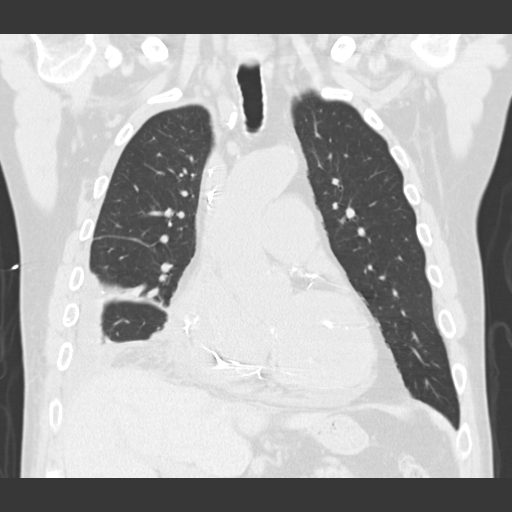
[im 58/97  lung]
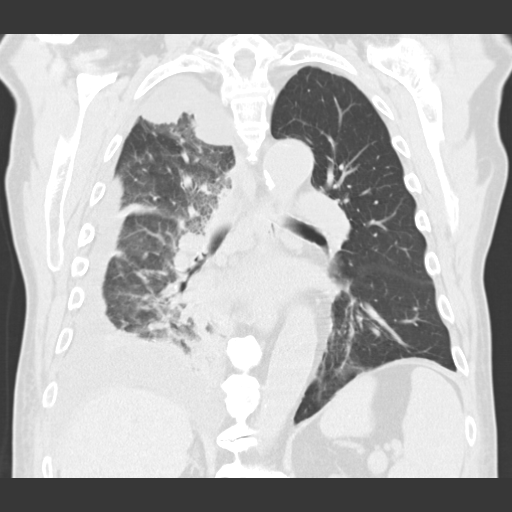

[14 of 36 positions shown; findings below may reference images not displayed]

FINDINGS: Mediastinum: Heart size is mildly enlarged with biatrial dilatation.
Small amount of pericardial fluid and/or thickening, unlikely to be
of hemodynamic significance at this time. There is atherosclerosis
of the thoracic aorta, the great vessels of the mediastinum and the
coronary arteries, including calcified atherosclerotic plaque in the
left main, left anterior descending, left circumflex and right
coronary arteries. Biventricular pacemaker device in position with
lead tips terminating in the right ventricular apex and overlying
the left ventricular anterior wall via the coronary sinus and
coronary veins. Multiple borderline enlarged mediastinal lymph
nodes, as well as a mildly enlarged subcarinal node measuring 11 mm
in short axis. Right hilar nodal tissue appears prominent, but is
difficult to separate from adjacent vasculature given the lack of IV
contrast on today's examination. Esophagus is unremarkable in
appearance.

Lungs/Pleura: Masslike area in the medial aspect of the right middle
lobe (image 49 of series 2) measuring 3.7 x 3.6 cm may simply
represent mass like airspace consolidation, however, the possibility
of underlying neoplasm is not excluded. In the adjacent right lower
lobe lung parenchyma there is extensive ground-glass attenuation,
septal thickening, multifocal nodularity and some atelectasis, all
of which has increased compared to the prior study. Enlarging
moderate right-sided pleural effusion, generally layering
dependently, although some of this appears partially loculated in
the apex of the right hemithorax.

Upper Abdomen: Again noted is a 3.2 x 4.6 cm intermediate
attenuation lesion in the central aspect of the left lobe of the
liver (segments 2 and 4A), recently characterized as a cavernous
hemangioma.

Musculoskeletal: There are no aggressive appearing lytic or blastic
lesions noted in the visualized portions of the skeleton.
IMPRESSION: 1. Although the findings in the right lower lobe may simply reflect
a right lower lobe pneumonia, the increasingly mass like appearance
in the medial aspect of the right lower lobe is concerning for
potential neoplasm, and the adjacent changes in the right lower lobe
could reflect worsening postobstructive pneumonitis/pneumonia.
Clinical correlation is recommended. Given the enlarging partially
loculated right-sided pleural effusion, sampling of the pleural
fluid to exclude neoplastic cells, and to assess for potential
empyema is suggested. If suspicious cells are identified by
thoracentesis, further evaluation with PET-CT would be recommended.
2. Mildly enlarged subcarinal lymph node, and prominent nodal tissue
in the right hilar region. While these findings could certainly be
reactive in the setting of infection, underlying neoplasm is not
excluded.
3. Atherosclerosis, including left main and 3 vessel coronary artery
disease.
4. Additional findings, as above.

## 2015-10-22 ENCOUNTER — Ambulatory Visit: Payer: Medicare Other

## 2015-11-07 ENCOUNTER — Other Ambulatory Visit: Payer: Medicare Other

## 2015-11-07 ENCOUNTER — Ambulatory Visit: Payer: Medicare Other

## 2015-11-07 ENCOUNTER — Ambulatory Visit: Payer: Medicare Other | Admitting: Hematology & Oncology

## 2015-12-28 ENCOUNTER — Other Ambulatory Visit: Payer: Self-pay | Admitting: Nurse Practitioner

## 2016-03-09 ENCOUNTER — Ambulatory Visit: Payer: Medicare Other | Admitting: Family Medicine

## 2016-03-13 ENCOUNTER — Ambulatory Visit: Payer: Medicare Other | Admitting: Family Medicine
